# Patient Record
Sex: Female | Born: 1980 | Race: White | Hispanic: No | Marital: Single | Smoking: Current every day smoker
Health system: Southern US, Community
[De-identification: ages and names within clinical notes are randomized; demographics above are authoritative.]

## PROBLEM LIST (undated history)

## (undated) DIAGNOSIS — F603 Borderline personality disorder: Secondary | ICD-10-CM

## (undated) DIAGNOSIS — T183XXA Foreign body in small intestine, initial encounter: Secondary | ICD-10-CM

## (undated) DIAGNOSIS — E039 Hypothyroidism, unspecified: Secondary | ICD-10-CM

## (undated) DIAGNOSIS — K219 Gastro-esophageal reflux disease without esophagitis: Secondary | ICD-10-CM

## (undated) DIAGNOSIS — F909 Attention-deficit hyperactivity disorder, unspecified type: Secondary | ICD-10-CM

## (undated) DIAGNOSIS — B20 Human immunodeficiency virus [HIV] disease: Secondary | ICD-10-CM

## (undated) DIAGNOSIS — J45909 Unspecified asthma, uncomplicated: Secondary | ICD-10-CM

## (undated) DIAGNOSIS — F32A Depression, unspecified: Secondary | ICD-10-CM

## (undated) DIAGNOSIS — E119 Type 2 diabetes mellitus without complications: Secondary | ICD-10-CM

## (undated) DIAGNOSIS — F419 Anxiety disorder, unspecified: Secondary | ICD-10-CM

## (undated) DIAGNOSIS — K259 Gastric ulcer, unspecified as acute or chronic, without hemorrhage or perforation: Secondary | ICD-10-CM

## (undated) DIAGNOSIS — F1910 Other psychoactive substance abuse, uncomplicated: Secondary | ICD-10-CM

## (undated) DIAGNOSIS — Z21 Asymptomatic human immunodeficiency virus [HIV] infection status: Secondary | ICD-10-CM

## (undated) HISTORY — DX: Type 2 diabetes mellitus without complications: E11.9

## (undated) HISTORY — DX: Foreign body in small intestine, initial encounter: T18.3XXA

## (undated) HISTORY — PX: NO PAST SURGERIES: SHX2092

## (undated) HISTORY — DX: Hypothyroidism, unspecified: E03.9

---
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
## 2012-01-31 DIAGNOSIS — G252 Other specified forms of tremor: Secondary | ICD-10-CM | POA: Insufficient documentation

## 2012-01-31 DIAGNOSIS — E785 Hyperlipidemia, unspecified: Secondary | ICD-10-CM | POA: Insufficient documentation

## 2012-01-31 DIAGNOSIS — G43909 Migraine, unspecified, not intractable, without status migrainosus: Secondary | ICD-10-CM | POA: Insufficient documentation

## 2013-08-12 ENCOUNTER — Other Ambulatory Visit (HOSPITAL_COMMUNITY): Payer: Self-pay | Admitting: Emergency Medicine

## 2013-08-12 ENCOUNTER — Ambulatory Visit (HOSPITAL_COMMUNITY)
Admission: RE | Admit: 2013-08-12 | Discharge: 2013-08-12 | Disposition: A | Discharge status: Home / Self Care | Payer: Self-pay | Source: Ambulatory Visit | Attending: Family Medicine | Admitting: Family Medicine

## 2013-08-12 ENCOUNTER — Encounter (HOSPITAL_COMMUNITY): Payer: Self-pay

## 2013-08-12 DIAGNOSIS — R109 Unspecified abdominal pain: Secondary | ICD-10-CM

## 2013-08-12 DIAGNOSIS — R1011 Right upper quadrant pain: Secondary | ICD-10-CM | POA: Insufficient documentation
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
## 2013-08-12 MED ORDER — IOHEXOL 300 MG/ML  SOLN
100.0000 mL | Freq: Once | INTRAMUSCULAR | Status: AC | PRN
  Administered 2013-08-12: 100 mL via INTRAVENOUS

## 2014-02-12 ENCOUNTER — Emergency Department (HOSPITAL_COMMUNITY): Payer: Self-pay

## 2014-02-12 ENCOUNTER — Encounter (HOSPITAL_COMMUNITY): Payer: Self-pay | Admitting: Family Medicine

## 2014-02-12 ENCOUNTER — Emergency Department (HOSPITAL_COMMUNITY)
Admission: EM | Admit: 2014-02-12 | Discharge: 2014-02-13 | Disposition: A | Discharge status: Home / Self Care | Payer: Self-pay | Attending: Emergency Medicine | Admitting: Emergency Medicine

## 2014-02-12 DIAGNOSIS — Z3202 Encounter for pregnancy test, result negative: Secondary | ICD-10-CM | POA: Insufficient documentation

## 2014-02-12 DIAGNOSIS — K529 Noninfective gastroenteritis and colitis, unspecified: Secondary | ICD-10-CM | POA: Insufficient documentation

## 2014-02-12 DIAGNOSIS — R112 Nausea with vomiting, unspecified: Secondary | ICD-10-CM | POA: Insufficient documentation

## 2014-02-12 DIAGNOSIS — Z79899 Other long term (current) drug therapy: Secondary | ICD-10-CM | POA: Insufficient documentation

## 2014-02-12 LAB — CBC WITH DIFFERENTIAL/PLATELET
Basophils Absolute: 0 10*3/uL (ref 0.0–0.1)
Basophils Relative: 0 % (ref 0–1)
Eosinophils Absolute: 0 10*3/uL (ref 0.0–0.7)
Eosinophils Relative: 0 % (ref 0–5)
HCT: 44 % (ref 36.0–46.0)
Hemoglobin: 14.4 g/dL (ref 12.0–15.0)
Lymphocytes Relative: 10 % — ABNORMAL LOW (ref 12–46)
Lymphs Abs: 1.1 10*3/uL (ref 0.7–4.0)
MCH: 28.9 pg (ref 26.0–34.0)
MCHC: 32.7 g/dL (ref 30.0–36.0)
MCV: 88.2 fL (ref 78.0–100.0)
Monocytes Absolute: 0.8 10*3/uL (ref 0.1–1.0)
Monocytes Relative: 7 % (ref 3–12)
Neutro Abs: 9.7 10*3/uL — ABNORMAL HIGH (ref 1.7–7.7)
Neutrophils Relative %: 83 % — ABNORMAL HIGH (ref 43–77)
Platelets: 242 10*3/uL (ref 150–400)
RBC: 4.99 MIL/uL (ref 3.87–5.11)
RDW: 12.4 % (ref 11.5–15.5)
WBC: 11.6 10*3/uL — ABNORMAL HIGH (ref 4.0–10.5)

## 2014-02-12 LAB — URINALYSIS, ROUTINE W REFLEX MICROSCOPIC
Bilirubin Urine: NEGATIVE
Glucose, UA: NEGATIVE mg/dL
Hgb urine dipstick: NEGATIVE
Ketones, ur: >80 mg/dL — AB
Leukocytes, UA: NEGATIVE
Nitrite: NEGATIVE
Protein, ur: NEGATIVE mg/dL
Specific Gravity, Urine: 1.027 (ref 1.005–1.030)
Urobilinogen, UA: 1 mg/dL (ref 0.0–1.0)
pH: 6 (ref 5.0–8.0)

## 2014-02-12 LAB — COMPREHENSIVE METABOLIC PANEL
ALT: 12 U/L (ref 0–35)
AST: 19 U/L (ref 0–37)
Albumin: 4.7 g/dL (ref 3.5–5.2)
Alkaline Phosphatase: 56 U/L (ref 39–117)
Anion gap: 18 — ABNORMAL HIGH (ref 5–15)
BUN: 21 mg/dL (ref 6–23)
CO2: 22 mEq/L (ref 19–32)
Calcium: 10.2 mg/dL (ref 8.4–10.5)
Chloride: 100 mEq/L (ref 96–112)
Creatinine, Ser: 0.74 mg/dL (ref 0.50–1.10)
GFR calc Af Amer: >90 mL/min (ref >90)
GFR calc non Af Amer: >90 mL/min (ref >90)
Glucose, Bld: 118 mg/dL — ABNORMAL HIGH (ref 70–99)
Potassium: 4.3 mEq/L (ref 3.7–5.3)
Sodium: 140 mEq/L (ref 137–147)
Total Bilirubin: 0.8 mg/dL (ref 0.3–1.2)
Total Protein: 7.7 g/dL (ref 6.0–8.3)

## 2014-02-12 LAB — LIPASE, BLOOD: Lipase: 23 U/L (ref 11–59)

## 2014-02-12 LAB — PREGNANCY, URINE: Preg Test, Ur: NEGATIVE

## 2014-02-12 IMAGING — CT CT ABD-PELV W/ CM
2 of 4 series · 16 of 46 positions shown, 18 images · IV contrast (Omni 300)
Comparison: [DATE]

CLINICAL DATA: Lower abdominal pain, nausea, vomiting x3 days

EXAM:
CT ABDOMEN AND PELVIS WITH CONTRAST
TECHNIQUE: Multidetector CT imaging of the abdomen and pelvis was performed
using the standard protocol following bolus administration of
intravenous contrast.
CONTRAST:  100mL OMNIPAQUE IOHEXOL 300 MG/ML  SOLN

[Series 2: abd/ pelvis 5.0 i30f 1 · axial · 0.63mm/px · z∈[+813,+1243]mm · 13 of 94 slices shown, 15 images]
[im 4/94  soft-tissue]
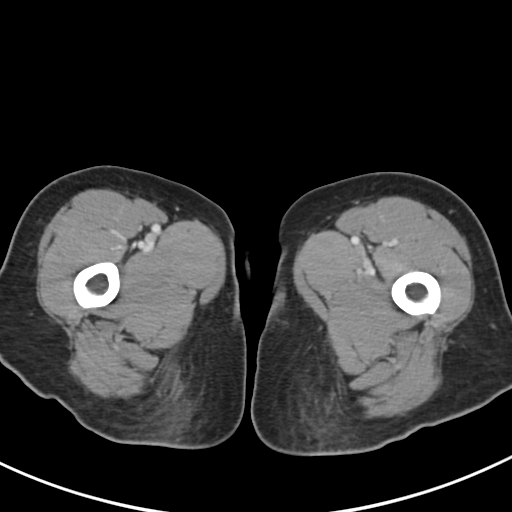
[im 4/94  bone]
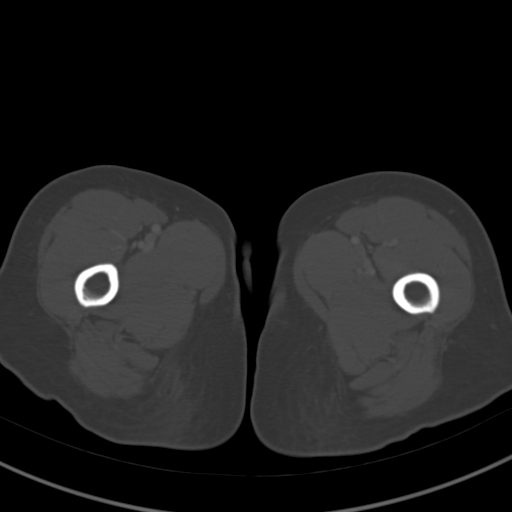
[im 12/94  soft-tissue]
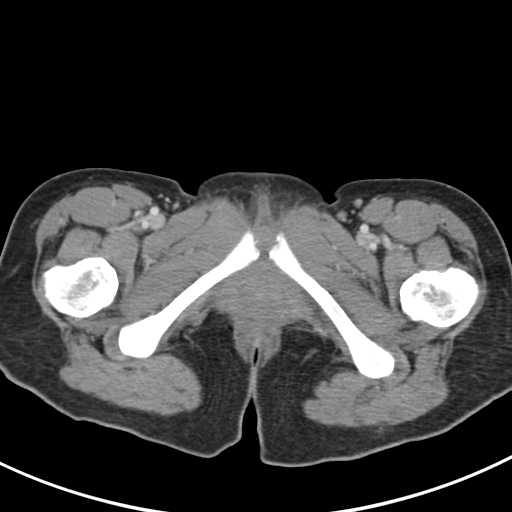
[im 19/94  soft-tissue]
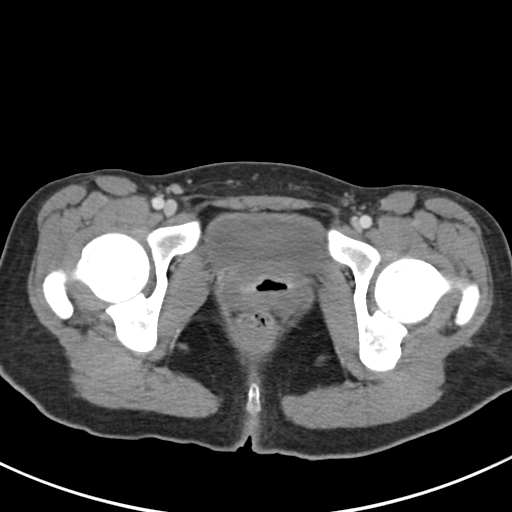
[im 27/94  soft-tissue]
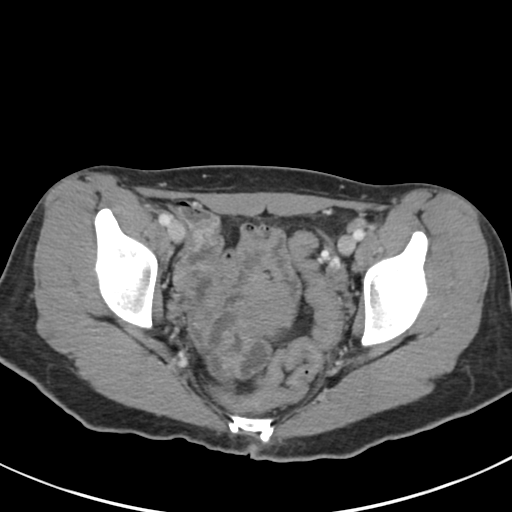
[im 34/94  soft-tissue]
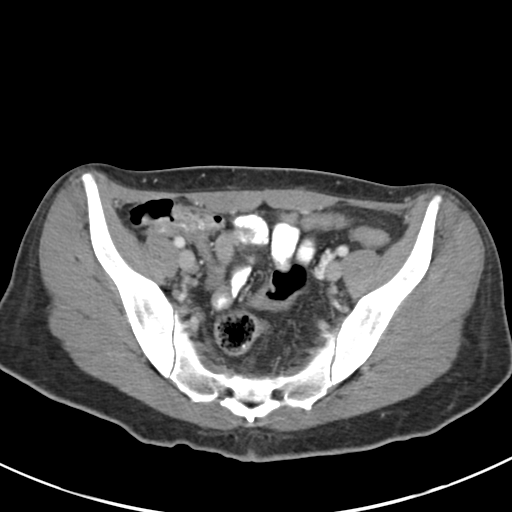
[im 41/94  soft-tissue]
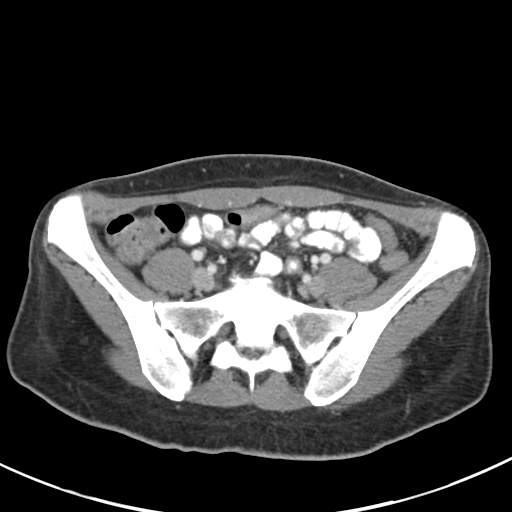
[im 49/94  soft-tissue]
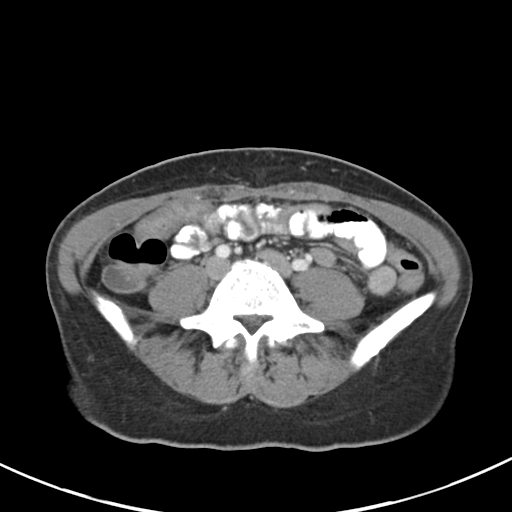
[im 53/94  soft-tissue]
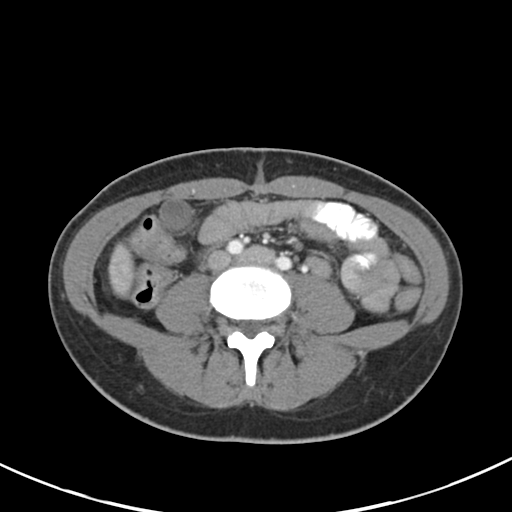
[im 60/94  soft-tissue]
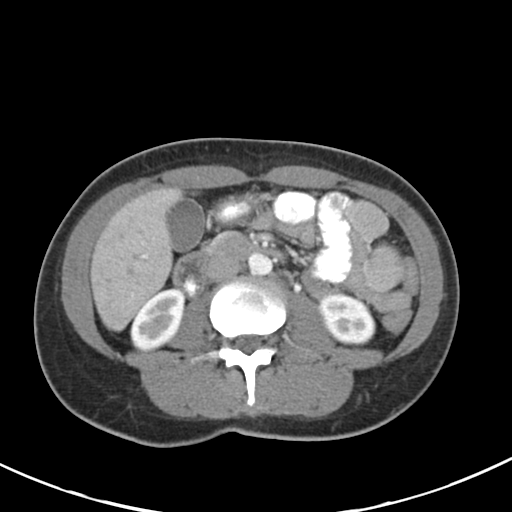
[im 60/94  bone]
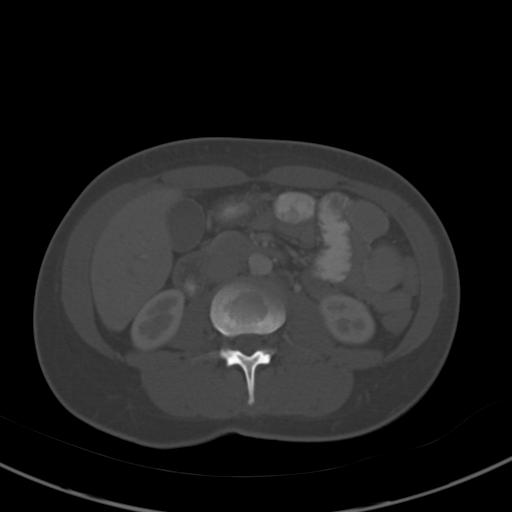
[im 67/94  soft-tissue]
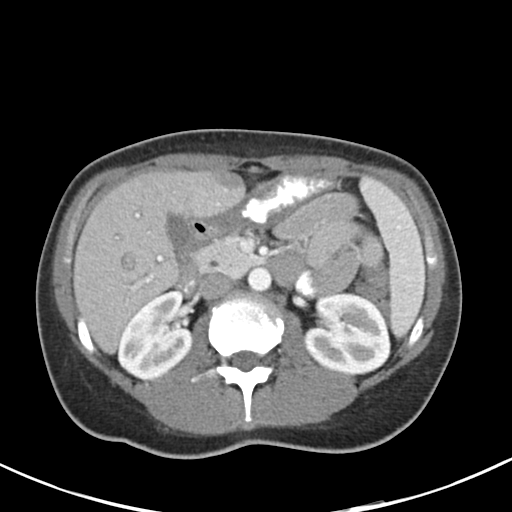
[im 75/94  soft-tissue]
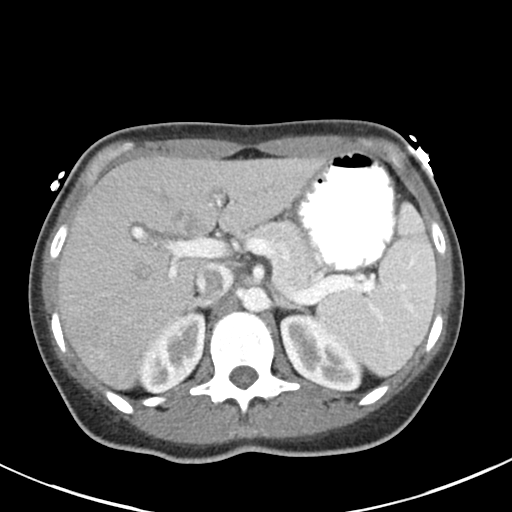
[im 82/94  soft-tissue]
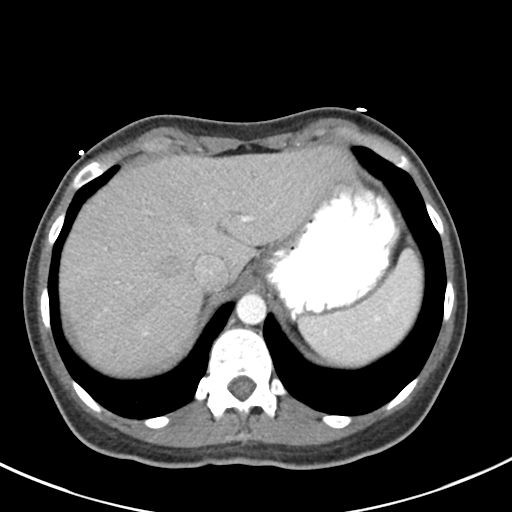
[im 90/94  soft-tissue]
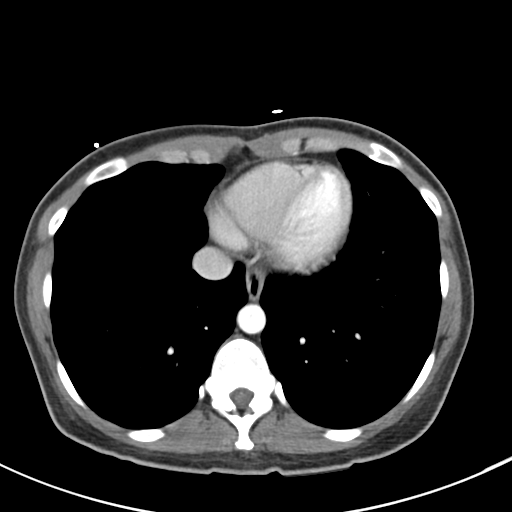

[Series 5: coronals · coronal · 0.70mm/px · 3 of 110 slices shown]
[im 37/110  soft-tissue]
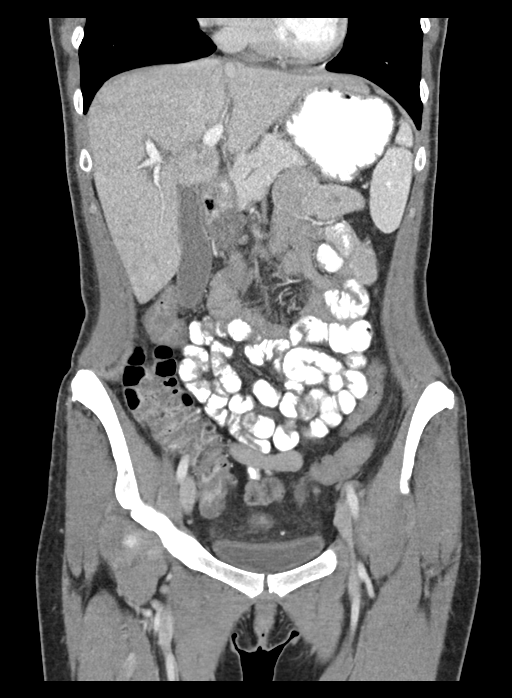
[im 49/110  soft-tissue]
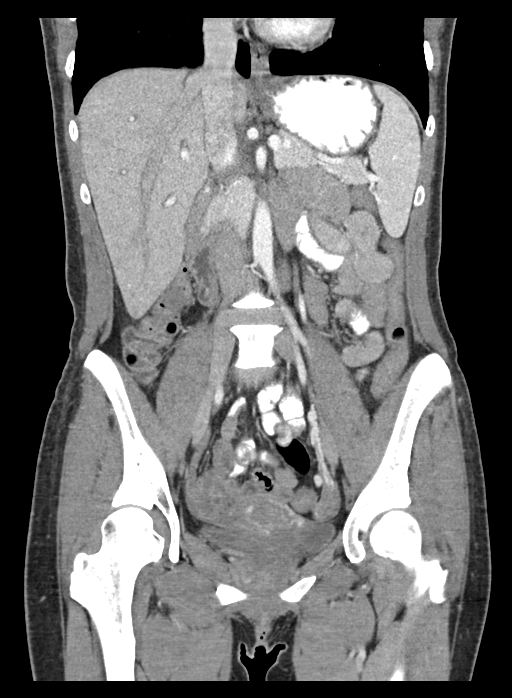
[im 61/110  soft-tissue]
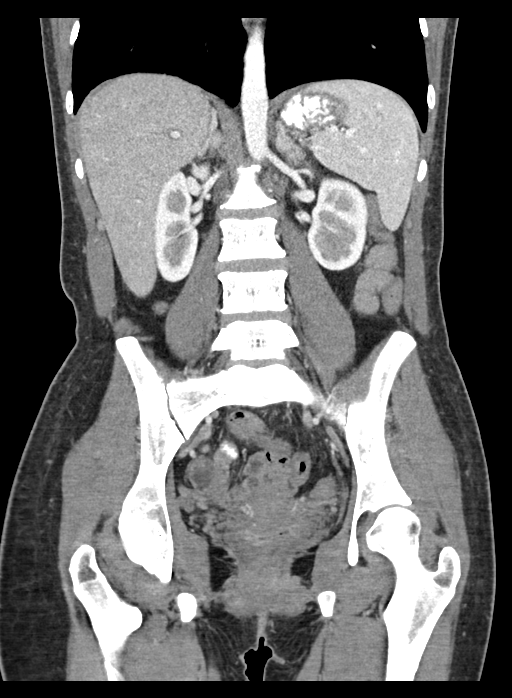

[16 of 46 positions shown; findings below may reference images not displayed]

FINDINGS: Lower chest:  Lung bases are clear.

Hepatobiliary: Liver is notable for mild periportal edema. Suspected
focal fat/ altered perfusion along the falciform ligament (series 2/
image 26).

Gallbladder is unremarkable. No intrahepatic or extrahepatic ductal
dilatation.

Pancreas: Within normal limits.

Spleen: Within normal limits.

Adrenals/Urinary Tract: Adrenal glands are unremarkable.

Kidneys are within normal limits.  No hydronephrosis.

Bladder is mildly thick-walled but underdistended.

Stomach/Bowel: Stomach is unremarkable.

No evidence of bowel obstruction.

Normal appendix.

Right colon is mildly thick-walled, (series 2/image 37), suggesting
infectious/inflammatory colitis.

Vascular/Lymphatic: No evidence of abdominal aortic aneurysm.

No suspicious abdominopelvic lymphadenopathy.

Reproductive: Uterus is unremarkable.

Bilateral ovaries are within normal limits.

Other: No abdominopelvic ascites.

Nonspecific pelvic mesenteric stranding (series 2/ image 72).

Musculoskeletal: Visualized osseous structures are within normal
limits.
IMPRESSION: Mildly thick-walled right colon, suggesting infectious/inflammatory
colitis.

No evidence of bowel obstruction.  Normal appendix.

## 2014-02-12 MED ORDER — METRONIDAZOLE 500 MG PO TABS: 500.0000 mg | ORAL_TABLET | Freq: Two times a day (BID) | ORAL | Status: DC

## 2014-02-12 MED ORDER — CIPROFLOXACIN HCL 500 MG PO TABS: 500.0000 mg | ORAL_TABLET | Freq: Two times a day (BID) | ORAL | Status: DC

## 2014-02-12 MED ORDER — FENTANYL CITRATE 0.05 MG/ML IJ SOLN
50.0000 ug | Freq: Once | INTRAMUSCULAR | Status: AC
  Administered 2014-02-12: 50 ug via INTRAVENOUS

## 2014-02-12 MED ORDER — CIPROFLOXACIN IN D5W 400 MG/200ML IV SOLN
400.0000 mg | Freq: Once | INTRAVENOUS | Status: AC
  Administered 2014-02-12: 400 mg via INTRAVENOUS

## 2014-02-12 MED ORDER — SODIUM CHLORIDE 0.9 % IV BOLUS (SEPSIS)
1000.0000 mL | Freq: Once | INTRAVENOUS | Status: AC
  Administered 2014-02-12: 1000 mL via INTRAVENOUS

## 2014-02-12 MED ORDER — METRONIDAZOLE IN NACL 5-0.79 MG/ML-% IV SOLN
500.0000 mg | Freq: Once | INTRAVENOUS | Status: AC
  Administered 2014-02-12: 500 mg via INTRAVENOUS

## 2014-02-12 MED ORDER — IOHEXOL 300 MG/ML  SOLN
100.0000 mL | Freq: Once | INTRAMUSCULAR | Status: AC | PRN
  Administered 2014-02-12: 100 mL via INTRAVENOUS

## 2014-02-12 MED ORDER — HYDROCODONE-ACETAMINOPHEN 5-325 MG PO TABS
1.0000 | ORAL_TABLET | Freq: Four times a day (QID) | ORAL | Status: DC | PRN
Start: 2014-02-12 — End: 2015-05-02

## 2014-02-12 MED ORDER — ONDANSETRON 4 MG PO TBDP
8.0000 mg | ORAL_TABLET | Freq: Once | ORAL | Status: AC
  Administered 2014-02-12: 8 mg via ORAL

## 2014-02-12 MED ORDER — ONDANSETRON HCL 4 MG/2ML IJ SOLN
4.0000 mg | Freq: Once | INTRAMUSCULAR | Status: AC
  Administered 2014-02-12: 4 mg via INTRAVENOUS

## 2014-02-12 MED ORDER — FLUCONAZOLE 200 MG PO TABS: 200.0000 mg | ORAL_TABLET | Freq: Every day | ORAL | Status: AC

## 2014-02-12 MED ORDER — IOHEXOL 300 MG/ML  SOLN
25.0000 mL | INTRAMUSCULAR | Status: AC
  Administered 2014-02-12: 25 mL via ORAL

## 2014-02-12 NOTE — ED Notes (Signed)
Pt having abdominal pain and vomiting since Tuesday. Denies diarrhea. sts last LMP 1 month ago.

## 2014-02-12 NOTE — Discharge Instructions (Signed)
As discussed, your evaluation today has resulted in a diagnosis of colitis.  This is inflammation and infection of the colon.  Given your history of IBS it is very important that you follow-up with appropriate care providers.  Please take all medication as directed, and do not hesitate to return here if you develop new, or concerning changes in your condition in the interim.   Colitis Colitis is inflammation of the colon. Colitis can be a short-term or long-standing (chronic) illness. Crohn's disease and ulcerative colitis are 2 types of colitis which are chronic. They usually require lifelong treatment. CAUSES  There are many different causes of colitis, including:  Viruses.  Germs (bacteria).  Medicine reactions. SYMPTOMS   Diarrhea.  Intestinal bleeding.  Pain.  Fever.  Throwing up (vomiting).  Tiredness (fatigue).  Weight loss.  Bowel blockage. DIAGNOSIS  The diagnosis of colitis is based on examination and stool or blood tests. X-rays, CT scan, and colonoscopy may also be needed. TREATMENT  Treatment may include:  Fluids given through the vein (intravenously).  Bowel rest (nothing to eat or drink for a period of time).  Medicine for pain and diarrhea.  Medicines (antibiotics) that kill germs.  Cortisone medicines.  Surgery. HOME CARE INSTRUCTIONS   Get plenty of rest.  Drink enough water and fluids to keep your urine clear or pale yellow.  Eat a well-balanced diet.  Call your caregiver for follow-up as recommended. SEEK IMMEDIATE MEDICAL CARE IF:   You develop chills.  You have an oral temperature above 102 F (38.9 C), not controlled by medicine.  You have extreme weakness, fainting, or dehydration.  You have repeated vomiting.  You develop severe belly (abdominal) pain or are passing bloody or tarry stools. MAKE SURE YOU:   Understand these instructions.  Will watch your condition.  Will get help right away if you are not doing well or  get worse. Document Released: 04/05/2004 Document Revised: 05/21/2011 Document Reviewed: 07/01/2009 [facility] Patient Information 2015 [place], [place]. This information is not intended to replace advice given to you by your health care provider. Make sure you discuss any questions you have with your health care provider.

## 2014-02-12 NOTE — ED Provider Notes (Signed)
CSN: [id number]     Arrival date & time 02/12/14  1716 History   First MD Initiated Contact with Patient 02/12/14 1924     Chief Complaint  Patient presents with  . Emesis  . Abdominal Pain     HPI  Patient presents with concern of nausea, vomiting, abdominal pain. The abdominal pain is diffuse, but most prominent in the periumbilical area.  Pain is sharp, crampy. Symptoms began 3 days ago.  Since onset symptoms have been persistent. Patient denies diarrhea, but also endorses anorexia, states that she has had little by mouth intake in the past 3 days. She denies fevers, chills, confusion, disorientation. No relief with anything. Patient states that she can take her medication, but with difficulty, and is unsure if she has vomited the medication back up.   History reviewed. No pertinent past medical history. History reviewed. No pertinent past surgical history. History reviewed. No pertinent family history. History  Substance Use Topics  . Smoking status: Unknown If Ever Smoked  . Smokeless tobacco: Not on file  . Alcohol Use: Not on file   OB History    No data available     Review of Systems  Constitutional:       Per HPI, otherwise negative  HENT:       Per HPI, otherwise negative  Respiratory:       Per HPI, otherwise negative  Cardiovascular:       Per HPI, otherwise negative  Gastrointestinal: Positive for nausea, vomiting and abdominal pain.  Endocrine:       Negative aside from HPI  Genitourinary:       Neg aside from HPI   Musculoskeletal:       Per HPI, otherwise negative  Skin: Negative.   Neurological: Negative for syncope.      Allergies  Review of patient's allergies indicates no known allergies.  Home Medications   Prior to Admission medications   Medication Sig Start Date End Date Taking? Authorizing Provider  albuterol (PROVENTIL HFA;VENTOLIN HFA) 108 (90 BASE) MCG/ACT inhaler Inhale 1 puff into the lungs every 6 (six) hours as needed for  wheezing or shortness of breath.   Yes Historical Provider, MD  clonazePAM (KLONOPIN) 1 MG tablet Take 1 mg by mouth 3 (three) times daily as needed for anxiety.  02/08/14  Yes Historical Provider, MD  FLUoxetine (PROZAC) 40 MG capsule Take 40 mg by mouth daily. 07/06/13  Yes Historical Provider, MD  levothyroxine (SYNTHROID, LEVOTHROID) 25 MCG tablet Take 25 mcg by mouth daily. 02/08/14  Yes Historical Provider, MD  traZODone (DESYREL) 150 MG tablet Take 150 mg by mouth at bedtime. 08/11/13  Yes Historical Provider, MD   BP 139/75 mmHg  Pulse 58  Temp(Src) 98.3 F (36.8 C) (Oral)  Resp 20  SpO2 100%  LMP 01/13/2014 Physical Exam  Constitutional: She is oriented to person, place, and time. She appears well-developed and well-nourished. No distress.  HENT:  Head: Normocephalic and atraumatic.  Eyes: Conjunctivae and EOM are normal.  Cardiovascular: Normal rate and regular rhythm.   Pulmonary/Chest: Effort normal and breath sounds normal. No stridor. No respiratory distress.  Abdominal: She exhibits no distension. There is no hepatosplenomegaly. There is tenderness. There is guarding. There is no rebound.    Musculoskeletal: She exhibits no edema.  Neurological: She is alert and oriented to person, place, and time. No cranial nerve deficit.  Skin: Skin is warm and dry.  Psychiatric: She has a normal mood and affect.  Nursing note  and vitals reviewed.   ED Course  Procedures (including critical care time) Labs Review Labs Reviewed  CBC WITH DIFFERENTIAL - Abnormal; Notable for the following:    WBC 11.6 (*)    Neutrophils Relative % 83 (*)    Neutro Abs 9.7 (*)    Lymphocytes Relative 10 (*)    All other components within normal limits  COMPREHENSIVE METABOLIC PANEL - Abnormal; Notable for the following:    Glucose, Bld 118 (*)    Anion gap 18 (*)    All other components within normal limits  URINALYSIS, ROUTINE W REFLEX MICROSCOPIC - Abnormal; Notable for the following:     Ketones, ur >80 (*)    All other components within normal limits  LIPASE, BLOOD  PREGNANCY, URINE    Imaging Review Ct Abdomen Pelvis W Contrast  02/12/2014   CLINICAL DATA:  Lower abdominal pain, nausea, vomiting x3 days  EXAM: CT ABDOMEN AND PELVIS WITH CONTRAST  TECHNIQUE: Multidetector CT imaging of the abdomen and pelvis was performed using the standard protocol following bolus administration of intravenous contrast.  CONTRAST:  100mL OMNIPAQUE IOHEXOL 300 MG/ML  SOLN  COMPARISON:  08/12/2013  FINDINGS: Lower chest:  Lung bases are clear.  Hepatobiliary: Liver is notable for mild periportal edema. Suspected focal fat/ altered perfusion along the falciform ligament (series 2/ image 26).  Gallbladder is unremarkable. No intrahepatic or extrahepatic ductal dilatation.  Pancreas: Within normal limits.  Spleen: Within normal limits.  Adrenals/Urinary Tract: Adrenal glands are unremarkable.  Kidneys are within normal limits.  No hydronephrosis.  Bladder is mildly thick-walled but underdistended.  Stomach/Bowel: Stomach is unremarkable.  No evidence of bowel obstruction.  Normal appendix.  Right colon is mildly thick-walled, (series 2/image 37), suggesting infectious/inflammatory colitis.  Vascular/Lymphatic: No evidence of abdominal aortic aneurysm.  No suspicious abdominopelvic lymphadenopathy.  Reproductive: Uterus is unremarkable.  Bilateral ovaries are within normal limits.  Other: No abdominopelvic ascites.  Nonspecific pelvic mesenteric stranding (series 2/ image 72).  Musculoskeletal: Visualized osseous structures are within normal limits.  IMPRESSION: Mildly thick-walled right colon, suggesting infectious/inflammatory colitis.  No evidence of bowel obstruction.  Normal appendix.   Electronically Signed   By: Charline BillsSriyesh  Krishnan M.D.   On: 02/12/2014 22:25     EKG Interpretation None     Already exam the patient appears slightly better, though she continues to complain of discomfort. We had a  lengthy conversation about colitis.  It ended the conversation stated that she had IBS, but has not ever seen a gastroenterologist. Patient and I, and her companion discussed the need to follow-up with gastroenterology, possibly [facility], for appropriate management of IBS. Given the patient's improvement here, she will receive an initial IV antibiotics, then be discharged if these are well tolerated, to follow-up with gastroenterology.  MDM   This patient who eventually states that she has IBS now presents with persistent abdominal pain, nausea, vomiting, anorexia. Patient's evaluation is consistent with colitis. No evidence for peritonitis. Patient was started on antibiotics, discharged to follow-up with gastroenterology.     Gerhard Munchobert Chonita Gadea, MD 02/12/14 [phone]

## 2014-02-13 MED ORDER — FENTANYL CITRATE 0.05 MG/ML IJ SOLN
50.0000 ug | Freq: Once | INTRAMUSCULAR | Status: AC
  Administered 2014-02-13: 50 ug via INTRAVENOUS

## 2015-05-02 ENCOUNTER — Emergency Department (HOSPITAL_COMMUNITY)
Admission: EM | Admit: 2015-05-02 | Discharge: 2015-05-02 | Disposition: A | Discharge status: Home / Self Care | Payer: Self-pay | Attending: Emergency Medicine | Admitting: Emergency Medicine

## 2015-05-02 ENCOUNTER — Encounter (HOSPITAL_COMMUNITY): Payer: Self-pay | Admitting: Emergency Medicine

## 2015-05-02 DIAGNOSIS — J45909 Unspecified asthma, uncomplicated: Secondary | ICD-10-CM | POA: Insufficient documentation

## 2015-05-02 DIAGNOSIS — R109 Unspecified abdominal pain: Secondary | ICD-10-CM

## 2015-05-02 DIAGNOSIS — R110 Nausea: Secondary | ICD-10-CM | POA: Insufficient documentation

## 2015-05-02 DIAGNOSIS — R6889 Other general symptoms and signs: Secondary | ICD-10-CM

## 2015-05-02 DIAGNOSIS — Z8659 Personal history of other mental and behavioral disorders: Secondary | ICD-10-CM | POA: Insufficient documentation

## 2015-05-02 DIAGNOSIS — A084 Viral intestinal infection, unspecified: Secondary | ICD-10-CM | POA: Insufficient documentation

## 2015-05-02 DIAGNOSIS — Z3202 Encounter for pregnancy test, result negative: Secondary | ICD-10-CM | POA: Insufficient documentation

## 2015-05-02 DIAGNOSIS — Z79899 Other long term (current) drug therapy: Secondary | ICD-10-CM | POA: Insufficient documentation

## 2015-05-02 HISTORY — DX: Unspecified asthma, uncomplicated: J45.909

## 2015-05-02 HISTORY — DX: Gastric ulcer, unspecified as acute or chronic, without hemorrhage or perforation: K25.9

## 2015-05-02 HISTORY — DX: Borderline personality disorder: F60.3

## 2015-05-02 LAB — CBC
HCT: 47.6 % — ABNORMAL HIGH (ref 36.0–46.0)
Hemoglobin: 16.1 g/dL — ABNORMAL HIGH (ref 12.0–15.0)
MCH: 30 pg (ref 26.0–34.0)
MCHC: 33.8 g/dL (ref 30.0–36.0)
MCV: 88.6 fL (ref 78.0–100.0)
Platelets: 251 10*3/uL (ref 150–400)
RBC: 5.37 MIL/uL — ABNORMAL HIGH (ref 3.87–5.11)
RDW: 12.9 % (ref 11.5–15.5)
WBC: 13.2 10*3/uL — ABNORMAL HIGH (ref 4.0–10.5)

## 2015-05-02 LAB — COMPREHENSIVE METABOLIC PANEL
ALT: 16 U/L (ref 14–54)
AST: 21 U/L (ref 15–41)
Albumin: 4.8 g/dL (ref 3.5–5.0)
Alkaline Phosphatase: 68 U/L (ref 38–126)
Anion gap: 16 — ABNORMAL HIGH (ref 5–15)
BUN: 30 mg/dL — ABNORMAL HIGH (ref 6–20)
CO2: 16 mmol/L — ABNORMAL LOW (ref 22–32)
Calcium: 9.7 mg/dL (ref 8.9–10.3)
Chloride: 107 mmol/L (ref 101–111)
Creatinine, Ser: 0.93 mg/dL (ref 0.44–1.00)
GFR calc Af Amer: >60 mL/min (ref >60)
GFR calc non Af Amer: >60 mL/min (ref >60)
Glucose, Bld: 183 mg/dL — ABNORMAL HIGH (ref 65–99)
Potassium: 4.3 mmol/L (ref 3.5–5.1)
Sodium: 139 mmol/L (ref 135–145)
Total Bilirubin: 1.3 mg/dL — ABNORMAL HIGH (ref 0.3–1.2)
Total Protein: 7.8 g/dL (ref 6.5–8.1)

## 2015-05-02 LAB — I-STAT BETA HCG BLOOD, ED (MC, WL, AP ONLY): I-stat hCG, quantitative: <5 m[IU]/mL (ref <5)

## 2015-05-02 LAB — LIPASE, BLOOD: Lipase: 25 U/L (ref 11–51)

## 2015-05-02 MED ORDER — TRAMADOL HCL 50 MG PO TABS
100.0000 mg | ORAL_TABLET | Freq: Once | ORAL | Status: AC
  Administered 2015-05-02: 100 mg via ORAL
  Filled 2015-05-02: qty 2

## 2015-05-02 MED ORDER — OXYCODONE-ACETAMINOPHEN 5-325 MG PO TABS
1.0000 | ORAL_TABLET | Freq: Once | ORAL | Status: AC
  Administered 2015-05-02: 1 via ORAL
  Filled 2015-05-02: qty 1

## 2015-05-02 MED ORDER — ONDANSETRON 4 MG PO TBDP
4.0000 mg | ORAL_TABLET | Freq: Once | ORAL | Status: AC | PRN
  Administered 2015-05-02: 4 mg via ORAL
  Filled 2015-05-02: qty 1

## 2015-05-02 MED ORDER — FAMOTIDINE 20 MG PO TABS
20.0000 mg | ORAL_TABLET | Freq: Once | ORAL | Status: AC
  Administered 2015-05-02: 20 mg via ORAL
  Filled 2015-05-02: qty 1

## 2015-05-02 MED ORDER — ACETAMINOPHEN ER 650 MG PO TBCR: 650.0000 mg | EXTENDED_RELEASE_TABLET | Freq: Three times a day (TID) | ORAL | Status: DC | PRN

## 2015-05-02 MED ORDER — ONDANSETRON 4 MG PO TBDP
4.0000 mg | ORAL_TABLET | Freq: Once | ORAL | Status: AC
  Administered 2015-05-02: 4 mg via ORAL
  Filled 2015-05-02: qty 1

## 2015-05-02 MED ORDER — ONDANSETRON 8 MG PO TBDP: 8.0000 mg | ORAL_TABLET | Freq: Three times a day (TID) | ORAL | Status: DC | PRN

## 2015-05-02 NOTE — ED Notes (Signed)
Pt discharged this morning with same complaint of abdominal pain and nausea; reports one episode of nausea post discharge; has not had prescriptions filled.

## 2015-05-02 NOTE — ED Notes (Signed)
Patient called to come back to room with no response.

## 2015-05-02 NOTE — Discharge Instructions (Signed)
Viral Gastroenteritis Viral gastroenteritis is also known as stomach flu. This condition affects the stomach and intestinal tract. It can cause sudden diarrhea and vomiting. The illness typically lasts 3 to 8 days. Most people develop an immune response that eventually gets rid of the virus. While this natural response develops, the virus can make you quite ill. CAUSES  Many different viruses can cause gastroenteritis, such as rotavirus or noroviruses. You can catch one of these viruses by consuming contaminated food or water. You may also catch a virus by sharing utensils or other personal items with an infected person or by touching a contaminated surface. SYMPTOMS  The most common symptoms are diarrhea and vomiting. These problems can cause a severe loss of body fluids (dehydration) and a body salt (electrolyte) imbalance. Other symptoms may include:  Fever.  Headache.  Fatigue.  Abdominal pain. DIAGNOSIS  Your caregiver can usually diagnose viral gastroenteritis based on your symptoms and a physical exam. A stool sample may also be taken to test for the presence of viruses or other infections. TREATMENT  This illness typically goes away on its own. Treatments are aimed at rehydration. The most serious cases of viral gastroenteritis involve vomiting so severely that you are not able to keep fluids down. In these cases, fluids must be given through an intravenous line (IV). HOME CARE INSTRUCTIONS   Drink enough fluids to keep your urine clear or pale yellow. Drink small amounts of fluids frequently and increase the amounts as tolerated.  Ask your caregiver for specific rehydration instructions.  Avoid:  Foods high in sugar.  Alcohol.  Carbonated drinks.  Tobacco.  Juice.  Caffeine drinks.  Extremely hot or cold fluids.  Fatty, greasy foods.  Too much intake of anything at one time.  Dairy products until 24 to 48 hours after diarrhea stops.  You may consume  probiotics. Probiotics are active cultures of beneficial bacteria. They may lessen the amount and number of diarrheal stools in adults. Probiotics can be found in yogurt with active cultures and in supplements.  Wash your hands well to avoid spreading the virus.  Only take over-the-counter or prescription medicines for pain, discomfort, or fever as directed by your caregiver. Do not give aspirin to children. Antidiarrheal medicines are not recommended.  Ask your caregiver if you should continue to take your regular prescribed and over-the-counter medicines.  Keep all follow-up appointments as directed by your caregiver. SEEK IMMEDIATE MEDICAL CARE IF:   You are unable to keep fluids down.  You do not urinate at least once every 6 to 8 hours.  You develop shortness of breath.  You notice blood in your stool or vomit. This may look like coffee grounds.  You have abdominal pain that increases or is concentrated in one small area (localized).  You have persistent vomiting or diarrhea.  You have a fever.  The patient is a child younger than 3 months, and he or she has a fever.  The patient is a child older than 3 months, and he or she has a fever and persistent symptoms.  The patient is a child older than 3 months, and he or she has a fever and symptoms suddenly get worse.  The patient is a baby, and he or she has no tears when crying. MAKE SURE YOU:   Understand these instructions.  Will watch your condition.  Will get help right away if you are not doing well or get worse.   This information is not intended to replace  advice given to you by your health care provider. Make sure you discuss any questions you have with your health care provider.   Document Released: 02/26/2005 Document Revised: 05/21/2011 Document Reviewed: 12/13/2010 Elsevier Interactive Patient Education 2016 [employer] Abdominal Pain, Adult Many things can cause abdominal pain. Usually, abdominal pain is  not caused by a disease and will improve without treatment. It can often be observed and treated at home. Your health care provider will do a physical exam and possibly order blood tests and X-rays to help determine the seriousness of your pain. However, in many cases, more time must pass before a clear cause of the pain can be found. Before that point, your health care provider may not know if you need more testing or further treatment. HOME CARE INSTRUCTIONS Monitor your abdominal pain for any changes. The following actions may help to alleviate any discomfort you are experiencing:  Only take over-the-counter or prescription medicines as directed by your health care provider.  Do not take laxatives unless directed to do so by your health care provider.  Try a clear liquid diet (broth, tea, or water) as directed by your health care provider. Slowly move to a bland diet as tolerated. SEEK MEDICAL CARE IF:  You have unexplained abdominal pain.  You have abdominal pain associated with nausea or diarrhea.  You have pain when you urinate or have a bowel movement.  You experience abdominal pain that wakes you in the night.  You have abdominal pain that is worsened or improved by eating food.  You have abdominal pain that is worsened with eating fatty foods.  You have a fever. SEEK IMMEDIATE MEDICAL CARE IF:  Your pain does not go away within 2 hours.  You keep throwing up (vomiting).  Your pain is felt only in portions of the abdomen, such as the right side or the left lower portion of the abdomen.  You pass bloody or black tarry stools. MAKE SURE YOU:  Understand these instructions.  Will watch your condition.  Will get help right away if you are not doing well or get worse.   This information is not intended to replace advice given to you by your health care provider. Make sure you discuss any questions you have with your health care provider.   Document Released: 12/06/2004  Document Revised: 11/17/2014 Document Reviewed: 11/05/2012 Elsevier Interactive Patient Education 2016 Else[employer] Inc[employer] Clear Liquid Diet A clear liquid diet is a short-term diet that is prescribed to provide the necessary fluid and basic energy you need when you can have nothing else. The clear liquid diet consists of liquids or solids that will become liquid at room temperature. You should be able to see through the liquid. There are many reasons that you may be restricted to clear liquids, such as:  When you have a sudden-onset (acute) condition that occurs before or after surgery.  To help your body slowly get adjusted to food again after a long period when you were unable to have food.  Replacement of fluids when you have a diarrheal disease.  When you are going to have certain exams, such as a colonoscopy, in which instruments are inserted inside your body to look at parts of your digestive system. WHAT CAN I HAVE? A clear liquid diet does not provide all the nutrients you need. It is important to choose a variety of the following items to get as many nutrients as possible:  Vegetable juices that do not have pulp.  Fruit  juices and fruit drinks that do not have pulp.  Coffee (regular or decaffeinated), tea, or soda at the discretion of your health care provider.  Clear bouillon, broth, or strained broth-based soups.  High-protein and flavored gelatins.  Sugar or honey.  Ices or frozen ice pops that do not contain milk. If you are not sure whether you can have certain items, you should ask your health care provider. You may also ask your health care provider if there are any other clear liquid options.   This information is not intended to replace advice given to you by your health care provider. Make sure you discuss any questions you have with your health care provider.   Document Released: 02/26/2005 Document Revised: 03/03/2013 Document Reviewed: 01/23/2013 Elsevier  Interactive Patient Education [employer]

## 2015-05-02 NOTE — ED Notes (Signed)
Writer provided pt with water for oral challenge

## 2015-05-02 NOTE — ED Notes (Signed)
Patient called twice for a room with no response.

## 2015-05-02 NOTE — ED Provider Notes (Signed)
CSN: [id number]     Arrival date & time 05/02/15  0308 History  By signing my name below, I, Tanda Rockers, attest that this documentation has been prepared under the direction and in the presence of Derwood Kaplan, MD. Electronically Signed: Tanda Rockers, ED Scribe. 05/02/2015. 3:29 AM.   Chief Complaint  Patient presents with  . Flu like Symptoms    The history is provided by the patient. No language interpreter was used.     HPI Comments: Kristin Hart is a 35 y.o. female who presents to the Emergency Department complaining of gradual onset, constant, diffuse abdominal pain that began around 7 PM tonight. Pt also complains of nausea, vomiting, diarrhea, chills, and generalized weakness. She mentions that the vomiting began around 11 PM tonight (approximately 5 hours ago). Pt estimates more than 5 episodes of vomiting and more than 5 episodes of diarrhea since onset. Pt has hx of stomach ulcers but cannot say if her pain is similar. Pt also complains of right arm pain. No recent sick contact with similar symptoms. No suspicious food intake. Denies hematemesis, hematochezia, fever, rhinorrhea, teary eyes, or any other associated symptoms.   Past Medical History  Diagnosis Date  . Asthma   . Stomach ulcer   . Psychiatric disorder   . Borderline personality disorder   . 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 No family history on file. Social History  Substance Use Topics  . Smoking status: Unknown If Ever Smoked  . Smokeless tobacco: Not on file  . Alcohol Use: Not on file   OB History    No data available     Review of Systems  10 Systems reviewed and all are negative for acute change except as noted in the HPI.   Allergies  Review of patient's allergies indicates no known allergies.  Home Medications   Prior to Admission medications   Medication Sig Start Date End Date Taking? Authorizing  Provider  albuterol (PROVENTIL HFA;VENTOLIN HFA) 108 (90 BASE) MCG/ACT inhaler Inhale 1 puff into the lungs every 6 (six) hours as needed for wheezing or shortness of breath.   Yes Historical Provider, MD  anti-nausea (EMETROL) solution Take 10 mLs by mouth every 15 (fifteen) minutes as needed for nausea or vomiting.   Yes Historical Provider, MD  bismuth subsalicylate (PEPTO BISMOL) 262 MG/15ML suspension Take 30 mLs by mouth every 6 (six) hours as needed (for nausea/vomiting).   Yes Historical Provider, MD   BP 110/69 mmHg  Pulse 87  Temp(Src) 98.4 F (36.9 C) (Oral)  Resp 18  Ht  (1.702 m)  Wt 140 lb (63.504 kg)  BMI 21.92 kg/m2  SpO2 99%  LMP 04/26/2015 (Approximate)   Physical Exam  Constitutional: She is oriented to person, place, and time. She appears well-developed and well-nourished. No distress.  HENT:  Head: Normocephalic and atraumatic.  Eyes: Conjunctivae and EOM are normal.  Neck: Neck supple. No tracheal deviation present.  Cardiovascular: Normal rate and regular rhythm.   Pulmonary/Chest: Effort normal and breath sounds normal. No respiratory distress. She has no wheezes. She has no rales.  Abdominal: Bowel sounds are normal. There is tenderness.  Diffuse tenderness but worse on left side and epigastrium  Musculoskeletal: Normal range of motion.  Neurological: She is alert and oriented to person, place, and time.  Skin: Skin is warm and dry.  Psychiatric: She has a normal mood and affect. Her behavior  is normal.  Nursing note and vitals reviewed.   ED Course  Procedures (including critical care time)  DIAGNOSTIC STUDIES: Oxygen Saturation is 98% on RA, normal by my interpretation.    COORDINATION OF CARE: 3:29 AM-Discussed treatment plan with pt at bedside and pt agreed to plan.   Labs Review Labs Reviewed  COMPREHENSIVE METABOLIC PANEL - Abnormal; Notable for the following:    CO2 16 (*)    Glucose, Bld 183 (*)    BUN 30 (*)    Total Bilirubin 1.3  (*)    Anion gap 16 (*)    All other components within normal limits  CBC - Abnormal; Notable for the following:    WBC 13.2 (*)    RBC 5.37 (*)    Hemoglobin 16.1 (*)    HCT 47.6 (*)    All other components within normal limits  LIPASE, BLOOD  URINALYSIS, ROUTINE W REFLEX MICROSCOPIC (NOT AT [facility])  I-STAT BETA HCG BLOOD, ED ([facility], [facility], [facility] ONLY)    Imaging Review No results found.   EKG Interpretation None      :40: patient still has some pain, but feels better. Emesis in control. Repeat abd exam is unchanged - still has diffuse tenderness, no peritoneal signs. If passes po challenge, will d.c. Strict return precautions discussed. Pt agrees with the plan. MDM   Final diagnoses:  None    I personally performed the services described in this documentation, which was scribed in my presence. The recorded information has been reviewed and is accurate.   Pt comes in with flu like symptoms. She has nausea, emesis, abd pain, congestion, myalgias, malaise. She has a non peritoneal abd exam with diffuse pain. She appears dry.  Likely viral syndrome. Will hydrate and get pain in better control.   Derwood Kaplan, MD 05/02/15 [phone]

## 2015-05-02 NOTE — ED Notes (Signed)
Patient called for vital signs recheck with no response. 

## 2015-05-02 NOTE — ED Notes (Signed)
Pt has been instructed not to drive for 4-6 hrs.

## 2015-05-02 NOTE — ED Notes (Signed)
Pt states that she started having generalized body aches, chills and N/V/D at 7pm tonight. Alert and oriented.

## 2015-12-13 LAB — GLUCOSE, POCT (MANUAL RESULT ENTRY): POC Glucose: 112 mg/dl — AB (ref 70–99)

## 2016-02-22 ENCOUNTER — Encounter: Payer: Self-pay | Admitting: Obstetrics & Gynecology

## 2016-02-22 ENCOUNTER — Ambulatory Visit (INDEPENDENT_AMBULATORY_CARE_PROVIDER_SITE_OTHER): Admitting: Obstetrics & Gynecology

## 2016-02-22 DIAGNOSIS — Z3402 Encounter for supervision of normal first pregnancy, second trimester: Secondary | ICD-10-CM

## 2016-02-22 DIAGNOSIS — Z3400 Encounter for supervision of normal first pregnancy, unspecified trimester: Secondary | ICD-10-CM | POA: Insufficient documentation

## 2016-02-22 DIAGNOSIS — F99 Mental disorder, not otherwise specified: Secondary | ICD-10-CM

## 2016-02-22 DIAGNOSIS — F1520 Other stimulant dependence, uncomplicated: Secondary | ICD-10-CM | POA: Insufficient documentation

## 2016-02-22 DIAGNOSIS — Z87898 Personal history of other specified conditions: Secondary | ICD-10-CM

## 2016-02-22 DIAGNOSIS — E039 Hypothyroidism, unspecified: Secondary | ICD-10-CM

## 2016-02-22 DIAGNOSIS — O24919 Unspecified diabetes mellitus in pregnancy, unspecified trimester: Secondary | ICD-10-CM

## 2016-02-22 DIAGNOSIS — O24912 Unspecified diabetes mellitus in pregnancy, second trimester: Secondary | ICD-10-CM

## 2016-02-22 DIAGNOSIS — O99612 Diseases of the digestive system complicating pregnancy, second trimester: Secondary | ICD-10-CM

## 2016-02-22 DIAGNOSIS — O99282 Endocrine, nutritional and metabolic diseases complicating pregnancy, second trimester: Secondary | ICD-10-CM

## 2016-02-22 DIAGNOSIS — R7303 Prediabetes: Secondary | ICD-10-CM | POA: Insufficient documentation

## 2016-02-22 DIAGNOSIS — K5900 Constipation, unspecified: Secondary | ICD-10-CM | POA: Insufficient documentation

## 2016-02-22 DIAGNOSIS — J45909 Unspecified asthma, uncomplicated: Secondary | ICD-10-CM | POA: Insufficient documentation

## 2016-02-22 DIAGNOSIS — F988 Other specified behavioral and emotional disorders with onset usually occurring in childhood and adolescence: Secondary | ICD-10-CM | POA: Insufficient documentation

## 2016-02-22 DIAGNOSIS — E1165 Type 2 diabetes mellitus with hyperglycemia: Secondary | ICD-10-CM | POA: Insufficient documentation

## 2016-02-22 DIAGNOSIS — F1591 Other stimulant use, unspecified, in remission: Secondary | ICD-10-CM

## 2016-02-22 DIAGNOSIS — O99519 Diseases of the respiratory system complicating pregnancy, unspecified trimester: Secondary | ICD-10-CM

## 2016-02-22 MED ORDER — OB COMPLETE PETITE 35-5-1-200 MG PO CAPS: 1.0000 | ORAL_CAPSULE | Freq: Every day | ORAL | 12 refills | Status: DC

## 2016-02-22 NOTE — Progress Notes (Signed)
  Subjective:    Kristin Hart is a  [age] yo SW G1P0 976w3d being seen today for her first obstetrical visit.  Her obstetrical history is significant for h/o Type 2 DM, hypothyroidism, meth use, incarceration, asthma, mental illness.. Patient is not sure about intend to breast feed. Pregnancy history fully reviewed.  Patient reports constipation.  There were no vitals filed for this visit.  HISTORY: OB History  Gravida Para Term Preterm AB Living  1            SAB TAB Ectopic Multiple Live Births               # Outcome Date GA Lbr Len/2nd Weight Sex Delivery Anes PTL Lv  1 Current              Past Medical History:  Diagnosis Date  . Asthma   . Borderline personality disorder   . 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 Diabetes mellitus without complication (HCC)    type 2  . Hypothyroidism   . Psychiatric disorder   . Stomach ulcer    History reviewed. No pertinent surgical history. History reviewed. No pertinent family history.   Exam    Uterus:     Pelvic Exam:    Perineum: No Hemorrhoids   Vulva: Normal, she has an infected hair follicle on her mons   Vagina:  normal mucosa   pH:    Cervix: anteverted   Adnexa: normal adnexa   Bony Pelvis: android  System: Breast:  normal appearance, no masses or tenderness   Skin: normal coloration and turgor, no rashes    Neurologic: oriented   Extremities: normal strength, tone, and muscle mass   HEENT PERRLA   Mouth/Teeth mucous membranes moist, pharynx normal without lesions   Neck supple   Cardiovascular: regular rate and rhythm   Respiratory:  appears well, vitals normal, no respiratory distress, acyanotic, normal RR, ear and throat exam is normal, neck free of mass or lymphadenopathy, chest clear, no wheezing, crepitations, rhonchi, normal symmetric air entry   Abdomen: soft, non-tender; bowel sounds normal; no masses,  no organomegaly   Urinary: urethral meatus normal       Assessment:    Pregnancy: G1P0 Patient Active Problem List   Diagnosis Date Noted  . Supervision of normal first pregnancy 02/22/2016  . Diabetes mellitus affecting pregnancy 02/22/2016  . Hypothyroidism affecting pregnancy 02/22/2016  . Asthma affecting pregnancy, antepartum 02/22/2016  . Mental health disorder 02/22/2016  . Constipation during pregnancy, second trimester 02/22/2016  . History of methamphetamine use 02/22/2016        Plan:     Initial labs drawn. Prenatal vitamins. Problem list reviewed and updated. Genetic Screening discussed Quad Screen: ordered.  Ultrasound discussed; fetal survey: ordered.  Follow up in 4 weeks. If Hba1c, RBS are normal, then 2 hour GTT at next visit. If not, then treat appropriately. Check TSH Kristin Hart Kristin Hart 02/22/2016

## 2016-02-22 NOTE — Progress Notes (Signed)
Pt states that she has HA and constipation.  Pt was previously on Tylenol and stool softner, pt states she has not been given while incarcerated.  Pt states that she would like to have an extra mat while incarcerated. Pt states she does not know dates of last cycle.  States end of July or beginnig Aug. Pt states she has not been treated for Thyroid or Diabetes in approx 2 years Pt states she was recently tested for HIV, RPR, Hep C--all were negative. Pt states she has a bump at pubic line-would like checked.

## 2016-02-26 LAB — CULTURE, OB URINE

## 2016-02-26 LAB — URINE CULTURE, OB REFLEX

## 2016-02-27 LAB — PAP LB, CT-NG, RFX HPV ASCU
Chlamydia, Nuc. Acid Amp: NEGATIVE
Gonococcus, Nuc. Acid Amp: NEGATIVE
PAP Smear Comment: 0

## 2016-02-29 LAB — TOXASSURE SELECT 13 (MW), URINE

## 2016-03-01 LAB — CYSTIC FIBROSIS MUTATION 97: Interpretation: NOT DETECTED

## 2016-03-01 LAB — OBSTETRIC PANEL, INCLUDING HIV
Antibody Screen: NEGATIVE
Basophils Absolute: 0 10*3/uL (ref 0.0–0.2)
Basos: 0 %
EOS (ABSOLUTE): 0.1 10*3/uL (ref 0.0–0.4)
Eos: 1 %
HIV Screen 4th Generation wRfx: NONREACTIVE
Hematocrit: 37.9 % (ref 34.0–46.6)
Hemoglobin: 12.8 g/dL (ref 11.1–15.9)
Hepatitis B Surface Ag: NEGATIVE
Immature Grans (Abs): 0.1 10*3/uL (ref 0.0–0.1)
Immature Granulocytes: 1 %
Lymphocytes Absolute: 1.4 10*3/uL (ref 0.7–3.1)
Lymphs: 13 %
MCH: 30.1 pg (ref 26.6–33.0)
MCHC: 33.8 g/dL (ref 31.5–35.7)
MCV: 89 fL (ref 79–97)
Monocytes Absolute: 0.7 10*3/uL (ref 0.1–0.9)
Monocytes: 7 %
Neutrophils Absolute: 8.2 10*3/uL — ABNORMAL HIGH (ref 1.4–7.0)
Neutrophils: 78 %
Platelets: 236 10*3/uL (ref 150–379)
RBC: 4.25 x10E6/uL (ref 3.77–5.28)
RDW: 13.1 % (ref 12.3–15.4)
RPR Ser Ql: NONREACTIVE
Rh Factor: POSITIVE
Rubella Antibodies, IGG: 9.09 index (ref >0.99)
WBC: 10.4 10*3/uL (ref 3.4–10.8)

## 2016-03-01 LAB — T4, FREE: Free T4: 0.85 ng/dL (ref 0.82–1.77)

## 2016-03-01 LAB — AFP, QUAD SCREEN
DIA Mom Value: 1.04
DIA Value (EIA): 176.83 pg/mL
DSR (By Age)    1 IN: 345
DSR (Second Trimester) 1 IN: 5521
Gestational Age: 19.4 WEEKS
MSAFP Mom: 1.43
MSAFP: 64.9 ng/mL
MSHCG Mom: 0.86
MSHCG: 18544 m[IU]/mL
Maternal Age At EDD: 34.3 YEARS
Osb Risk: 3311
T18 (By Age): 1:1346 {titer}
Test Results:: NEGATIVE
Weight: 183 [lb_av]
uE3 Mom: 1.41
uE3 Value: 2.29 ng/mL

## 2016-03-01 LAB — GLUCOSE, RANDOM: Glucose: 112 mg/dL — ABNORMAL HIGH (ref 65–99)

## 2016-03-01 LAB — T4: T4, Total: 8.7 ug/dL (ref 4.5–12.0)

## 2016-03-01 LAB — HEMOGLOBIN A1C
Est. average glucose Bld gHb Est-mCnc: 97 mg/dL
Hgb A1c MFr Bld: 5 % (ref 4.8–5.6)

## 2016-03-01 LAB — T3 UPTAKE
Free Thyroxine Index: 1.5 (ref 1.2–4.9)
T3 Uptake Ratio: 17 % — ABNORMAL LOW (ref 24–39)

## 2016-03-01 LAB — VARICELLA ZOSTER ANTIBODY, IGG: Varicella zoster IgG: 1405 index (ref >165)

## 2016-03-01 LAB — TSH: TSH: 3.45 u[IU]/mL (ref 0.450–4.500)

## 2016-03-01 LAB — VITAMIN D 25 HYDROXY (VIT D DEFICIENCY, FRACTURES): Vit D, 25-Hydroxy: 25.2 ng/mL — ABNORMAL LOW (ref 30.0–100.0)

## 2016-03-15 ENCOUNTER — Other Ambulatory Visit: Payer: Self-pay | Admitting: Certified Nurse Midwife

## 2016-03-15 DIAGNOSIS — R7989 Other specified abnormal findings of blood chemistry: Secondary | ICD-10-CM | POA: Insufficient documentation

## 2016-03-15 MED ORDER — VITAMIN D (ERGOCALCIFEROL) 1.25 MG (50000 UNIT) PO CAPS: 50000.0000 [IU] | ORAL_CAPSULE | ORAL | 2 refills | Status: DC

## 2016-03-23 ENCOUNTER — Ambulatory Visit (HOSPITAL_COMMUNITY): Payer: Medicaid Other

## 2016-03-23 ENCOUNTER — Encounter: Payer: Medicaid Other | Admitting: Obstetrics

## 2016-04-12 LAB — OB RESULTS CONSOLE HIV ANTIBODY (ROUTINE TESTING): HIV: NONREACTIVE

## 2016-04-12 LAB — CYTOLOGY - PAP: Pap: NEGATIVE

## 2016-04-12 LAB — SICKLE CELL SCREEN: Sickle Cell Screen: NORMAL

## 2016-05-28 ENCOUNTER — Encounter (HOSPITAL_COMMUNITY): Payer: Self-pay | Admitting: Emergency Medicine

## 2016-05-28 ENCOUNTER — Emergency Department (HOSPITAL_COMMUNITY)
Admission: EM | Admit: 2016-05-28 | Discharge: 2016-05-28 | Disposition: A | Discharge status: Home / Self Care | Payer: Medicaid Other | Attending: Emergency Medicine | Admitting: Emergency Medicine

## 2016-05-28 DIAGNOSIS — J45909 Unspecified asthma, uncomplicated: Secondary | ICD-10-CM | POA: Diagnosis not present

## 2016-05-28 DIAGNOSIS — O26893 Other specified pregnancy related conditions, third trimester: Secondary | ICD-10-CM | POA: Diagnosis present

## 2016-05-28 DIAGNOSIS — E039 Hypothyroidism, unspecified: Secondary | ICD-10-CM | POA: Diagnosis not present

## 2016-05-28 DIAGNOSIS — E119 Type 2 diabetes mellitus without complications: Secondary | ICD-10-CM | POA: Insufficient documentation

## 2016-05-28 DIAGNOSIS — Z3A32 32 weeks gestation of pregnancy: Secondary | ICD-10-CM | POA: Insufficient documentation

## 2016-05-28 LAB — BASIC METABOLIC PANEL
Anion gap: 9 (ref 5–15)
BUN: 8 mg/dL (ref 6–20)
CO2: 25 mmol/L (ref 22–32)
Calcium: 9.2 mg/dL (ref 8.9–10.3)
Chloride: 102 mmol/L (ref 101–111)
Creatinine, Ser: 0.78 mg/dL (ref 0.44–1.00)
GFR calc Af Amer: >60 mL/min (ref >60)
GFR calc non Af Amer: >60 mL/min (ref >60)
Glucose, Bld: 139 mg/dL — ABNORMAL HIGH (ref 65–99)
Potassium: 4.8 mmol/L (ref 3.5–5.1)
Sodium: 136 mmol/L (ref 135–145)

## 2016-05-28 LAB — URINALYSIS, ROUTINE W REFLEX MICROSCOPIC
Bilirubin Urine: NEGATIVE
Glucose, UA: 50 mg/dL — AB
Hgb urine dipstick: NEGATIVE
Ketones, ur: NEGATIVE mg/dL
Leukocytes, UA: NEGATIVE
Nitrite: NEGATIVE
Protein, ur: NEGATIVE mg/dL
Specific Gravity, Urine: 1.017 (ref 1.005–1.030)
pH: 6 (ref 5.0–8.0)

## 2016-05-28 LAB — CBG MONITORING, ED: Glucose-Capillary: 138 mg/dL — ABNORMAL HIGH (ref 65–99)

## 2016-05-28 LAB — CBC WITH DIFFERENTIAL/PLATELET
Basophils Absolute: 0 10*3/uL (ref 0.0–0.1)
Basophils Relative: 0 %
Eosinophils Absolute: 0.2 10*3/uL (ref 0.0–0.7)
Eosinophils Relative: 2 %
HCT: 38.7 % (ref 36.0–46.0)
Hemoglobin: 12.8 g/dL (ref 12.0–15.0)
Lymphocytes Relative: 13 %
Lymphs Abs: 1.7 10*3/uL (ref 0.7–4.0)
MCH: 30.7 pg (ref 26.0–34.0)
MCHC: 33.1 g/dL (ref 30.0–36.0)
MCV: 92.8 fL (ref 78.0–100.0)
Monocytes Absolute: 0.9 10*3/uL (ref 0.1–1.0)
Monocytes Relative: 7 %
Neutro Abs: 10.3 10*3/uL — ABNORMAL HIGH (ref 1.7–7.7)
Neutrophils Relative %: 78 %
Platelets: 213 10*3/uL (ref 150–400)
RBC: 4.17 MIL/uL (ref 3.87–5.11)
RDW: 12.5 % (ref 11.5–15.5)
WBC: 13.1 10*3/uL — ABNORMAL HIGH (ref 4.0–10.5)

## 2016-05-28 MED ORDER — SODIUM CHLORIDE 0.9 % IV BOLUS (SEPSIS)
1000.0000 mL | Freq: Once | INTRAVENOUS | Status: AC
  Administered 2016-05-28: 1000 mL via INTRAVENOUS

## 2016-05-28 NOTE — Progress Notes (Signed)
Pt is a G1P0 at 8133 1/[redacted] weeks gestation here with c/o abd cramping. No vaginal bleeding or leaking of fluid. Pt was placed on EFM by the ED staff. FHR is a category 1 tracing with no uc's. Pt says she has been incarcerated and has just recently been released from jail. Says she is a gestational diabetic and takes metformin 500mg  twice daily.  S[facility]. She was seen by Dr. Marice Potterove on 02/22/2016 at 19 3/[redacted] weeks gestation before she was incarcerated. Cervix is closed, thk, presenting part is high. Dr. Alysia PennaErvin notified. [facility] Pt's medical record number given to Dr. Alysia PennaErvin and he says he will send a message to the clinic that the pt needs to be seen. Pt [facility] Says she will call the cli nic. ED staff notified of the plan of care.

## 2016-05-28 NOTE — ED Triage Notes (Signed)
Pt here approx 8 months pregnant with increased fetal movement and pain; pt sts some swelling in feet; pt G1

## 2016-05-28 NOTE — ED Notes (Signed)
Pt moved to room trauma C-- placed on fetal monitor- fetal heart rate =138, left mid abd. Rapid response nurse notified.

## 2016-05-28 NOTE — Discharge Instructions (Signed)
Return here as needed.  [facility]

## 2016-05-28 NOTE — ED Notes (Signed)
EDPA made aware pt has spoken with social work.

## 2016-05-28 NOTE — ED Provider Notes (Signed)
[facility]-EMERGENCY DEPT Provider Note   CSN: [id number] Arrival date & time: 05/28/16  1020     History   Chief Complaint Chief Complaint  Patient presents with  . Abdominal Pain    HPI Kristin Hart is a 36 y.o. female.  HPI Patient presents to the emergency department with intermittent abdominal cramping and the patient is 8 months pregnant.  The patient states she recently got out of jail and has noticed intermittent abdominal cramping over the last few days.  She states she has also had swelling that is increased in her lower extremities over the last few days as well.  Patient states that she does have gestational diabetes.  Patient states she did see receive some care while in prison.  She states that nothing seems make the condition better or worse.  She states that she does not have a GYN. The patient denies chest pain, shortness of breath, headache,blurred vision, neck pain, fever, cough, weakness, numbness, dizziness, anorexia, edema,nausea, vomiting, diarrhea, rash, back pain, dysuria, hematemesis, bloody stool, near syncope, or syncope. Past Medical History:  Diagnosis Date  . Asthma   . Borderline personality disorder   . 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 Diabetes mellitus without complication (HCC)    type 2  . Hypothyroidism   . Psychiatric disorder   . Stomach ulcer     Patient Active Problem List   Diagnosis Date Noted  . Low vitamin D level 03/15/2016  . Supervision of normal first pregnancy 02/22/2016  . Diabetes mellitus affecting pregnancy 02/22/2016  . Hypothyroidism affecting pregnancy 02/22/2016  . Asthma affecting pregnancy, antepartum 02/22/2016  . Mental health disorder 02/22/2016  . Constipation during pregnancy, second trimester 02/22/2016  . History of methamphetamine use 02/22/2016    History reviewed. No pertinent surgical history.  OB History    Gravida Para Term Preterm AB Living   1               SAB TAB Ectopic Multiple Live Births                   Home Medications    Prior to Admission medications   Medication Sig Start Date End Date Taking? Authorizing Provider  Prenat-FeCbn-FeAspGl-FA-Omega (OB COMPLETE PETITE) 35-5-1-200 MG CAPS Take 1 tablet by mouth daily. 02/22/16  Yes Allie Bossier, MD  Vitamin D, Ergocalciferol, (DRISDOL) 50000 units CAPS capsule Take 1 capsule (50,000 Units total) by mouth every 7 (seven) days. 03/15/16  Yes Rachelle Sindy Messing, CNM    Family History History reviewed. No pertinent family history.  Social History Social History  Substance Use Topics  . Smoking status: Never Smoker  . Smokeless tobacco: Never Used  . Alcohol use No     Allergies   Patient has no known allergies.   Review of Systems Review of Systems All other systems negative except as documented in the HPI. All pertinent positives and negatives as reviewed in the HPI.  Physical Exam Updated Vital Signs BP 129/70 (BP Location: Right Arm)   Pulse 93   Temp 98.8 F (37.1 C) (Oral)   Resp 18   LMP 10/09/2015 (Approximate)   SpO2 98%   Physical Exam  Constitutional: She is oriented to person, place, and time. She appears well-developed and well-nourished. No distress.  HENT:  Head: Normocephalic and atraumatic.  Mouth/Throat: Oropharynx is clear and moist.  Eyes: Pupils are equal, round, and reactive to light.  Neck: Normal range  of motion. Neck supple.  Cardiovascular: Normal rate, regular rhythm and normal heart sounds.  Exam reveals no gallop and no friction rub.   No murmur heard. Pulmonary/Chest: Effort normal and breath sounds normal. No respiratory distress. She has no wheezes.  Abdominal: Soft. Bowel sounds are normal. She exhibits no distension. There is no tenderness.    Neurological: She is alert and oriented to person, place, and time. She exhibits normal muscle tone. Coordination normal.  Skin: Skin is warm and dry. Capillary refill takes less than 2  seconds. No rash noted. No erythema.  Psychiatric: She has a normal mood and affect. Her behavior is normal.  Nursing note and vitals reviewed.    ED Treatments / Results  Labs (all labs ordered are listed, but only abnormal results are displayed) Labs Reviewed  BASIC METABOLIC PANEL - Abnormal; Notable for the following:       Result Value   Glucose, Bld 139 (*)    All other components within normal limits  CBC WITH DIFFERENTIAL/PLATELET - Abnormal; Notable for the following:    WBC 13.1 (*)    Neutro Abs 10.3 (*)    All other components within normal limits  URINALYSIS, ROUTINE W REFLEX MICROSCOPIC - Abnormal; Notable for the following:    Glucose, UA 50 (*)    All other components within normal limits  CBG MONITORING, ED - Abnormal; Notable for the following:    Glucose-Capillary 138 (*)    All other components within normal limits    EKG  EKG Interpretation None       Radiology No results found.  Procedures Procedures (including critical care time)  Medications Ordered in ED Medications  sodium chloride 0.9 % bolus 1,000 mL (1,000 mLs Intravenous New Bag/Given 05/28/16 1300)     Initial Impression / Assessment and Plan / ED Course  I have reviewed the triage vital signs and the nursing notes.  Pertinent labs & imaging results that were available during my care of the patient were reviewed by me and considered in my medical decision making (see chart for details).    DOB rapid response nurse saw the patient and feels that she can be discharged home.  They will see her in the [facility] in the next week.  I spoke with the social worker about housing for the patient.  Patient is advised return here as needed, but I did advise her women's hospital would be a better choice for follow-up if she does have any worsening in her condition  Final Clinical Impressions(s) / ED Diagnoses   Final diagnoses:  None    New Prescriptions New Prescriptions    No medications on file     Charlestine NightChristopher Samual Beals, PA-C 05/28/16 1446    Gerhard Munchobert Lockwood, MD 05/28/16 1549

## 2016-06-04 ENCOUNTER — Ambulatory Visit: Payer: Self-pay | Admitting: Clinical

## 2016-06-04 ENCOUNTER — Ambulatory Visit (INDEPENDENT_AMBULATORY_CARE_PROVIDER_SITE_OTHER): Payer: Medicaid Other | Admitting: Family Medicine

## 2016-06-04 VITALS — BP 111/66 | HR 86 | Wt 210.8 lb

## 2016-06-04 DIAGNOSIS — Z3403 Encounter for supervision of normal first pregnancy, third trimester: Secondary | ICD-10-CM

## 2016-06-04 DIAGNOSIS — F1290 Cannabis use, unspecified, uncomplicated: Secondary | ICD-10-CM

## 2016-06-04 DIAGNOSIS — Z87898 Personal history of other specified conditions: Secondary | ICD-10-CM

## 2016-06-04 DIAGNOSIS — Z8659 Personal history of other mental and behavioral disorders: Secondary | ICD-10-CM

## 2016-06-04 DIAGNOSIS — O99283 Endocrine, nutritional and metabolic diseases complicating pregnancy, third trimester: Secondary | ICD-10-CM

## 2016-06-04 DIAGNOSIS — O99343 Other mental disorders complicating pregnancy, third trimester: Secondary | ICD-10-CM

## 2016-06-04 DIAGNOSIS — F329 Major depressive disorder, single episode, unspecified: Secondary | ICD-10-CM

## 2016-06-04 DIAGNOSIS — O24913 Unspecified diabetes mellitus in pregnancy, third trimester: Secondary | ICD-10-CM | POA: Diagnosis present

## 2016-06-04 DIAGNOSIS — E039 Hypothyroidism, unspecified: Secondary | ICD-10-CM

## 2016-06-04 DIAGNOSIS — F1591 Other stimulant use, unspecified, in remission: Secondary | ICD-10-CM

## 2016-06-04 LAB — POCT URINALYSIS DIP (DEVICE)
Bilirubin Urine: NEGATIVE
Glucose, UA: 100 mg/dL — AB
Ketones, ur: NEGATIVE mg/dL
Leukocytes, UA: NEGATIVE
Nitrite: NEGATIVE
Protein, ur: NEGATIVE mg/dL
Specific Gravity, Urine: 1.025 (ref 1.005–1.030)
Urobilinogen, UA: 0.2 mg/dL (ref 0.0–1.0)
pH: 5.5 (ref 5.0–8.0)

## 2016-06-04 LAB — OB RESULTS CONSOLE GBS: GBS: NEGATIVE

## 2016-06-04 NOTE — BH Specialist Note (Signed)
Integrated Behavioral Health Initial Visit  MRN: [id number] Name: Kristin Hart   Session Start time: 2:05 Session End time: 2:15 Total time: 10 minutes  Type of Service: Integrated Behavioral Health- Individual/Family Interpretor:No. Interpretor Name and Language: n/a   Warm Hand Off Completed.       SUBJECTIVE: Kristin Beetsshley R Faires is a 36 y.o. female accompanied by FOB. Patient was referred by Dr Omer JackMumaw for past anxiety/depression. Patient reports the following symptoms/concerns: Pt states she has no concerns today, but open to taking educational materials today. Duration of problem: Undetermined; Severity of problem: n/a  OBJECTIVE: Mood: Appropriate and Affect: Appropriate Risk of harm to self or others: No plan to harm self or others   LIFE CONTEXT: Family and Social: Pt states that she has a great support system School/Work: - Self-Care: - Life Changes: Current pregnancy, first  GOALS ADDRESSED: Patient will maintain reduction in symptoms of: anxiety and depression    INTERVENTIONS: Supportive Counseling and Psychoeducation and/or Health Education  Standardized Assessments completed: GAD-7 and PHQ 9  ASSESSMENT: Patient currently experiencing History of depression.Patient may benefit from psychoeducation and brief supportive counseling today regarding maintaining reduction in depressive symptoms.  PLAN: 1. Follow up with behavioral health clinician on : one week 2. Behavioral recommendations:  -Consider reading educational material regarding coping with symptoms of depression (and anxiety), to discuss at next visit -Consider apps as additional self-coping strategy 3. Referral(s): [facility] 4. "From scale of 1-10, how likely are you to follow plan?": 8  Rae LipsJamie C Lenord Fralix, LCSWA   Depression screen [facility] 2/9 06/04/2016  Decreased Interest 0  Down, Depressed, Hopeless 0  PHQ - 2 Score 0  Altered sleeping 0  Tired,  decreased energy 0  Change in appetite 0  Feeling bad or failure about yourself  0  Trouble concentrating 0  Moving slowly or fidgety/restless 0  Suicidal thoughts 0  PHQ-9 Score 0   GAD 7 : Generalized Anxiety Score 06/04/2016  Nervous, Anxious, on Edge 2  Control/stop worrying 0  Worry too much - different things 0  Trouble relaxing 1  Restless 0  Easily annoyed or irritable 0  Afraid - awful might happen 0  Total GAD 7 Score 3

## 2016-06-04 NOTE — Patient Instructions (Addendum)
AREA PEDIATRIC/FAMILY [place] [address] [place], [place]  [address] Phone - [phone]   Fax - [phone]  [facility] [place] [address] [place], [place] [address] Phone - [phone]   Fax - [place] [address] [place] [address], [place]  [address] Phone - [phone]   Fax - [phone]  [place] [address], [place] [address] [place], [place]  [address] Phone - [phone]   Fax - [phone]  [place] [address] [place], [place]  [address] Phone - [phone]   Fax - [phone]  [facility] [address] [place], [place]  [address] Phone - [phone]   Fax - [place] [address], [place], [place]  [address] Phone - [phone]   Fax - [phone]  [place] [address] [place], [place] [address] [place], [place]  [address] Phone - [phone]   Fax - [place] [address] [place], [place]  [address] Phone - [phone]   Fax - [phone] [facility] [place]. [address] [place], [place]  [address] Phone - [phone]   Fax - [phone]  [place] [address], [place]  [address] Phone - [phone]   Fax - [phone]  [facility] AT [place], [place], [place]  [address] Phone - [phone]   Fax - [place] [address], [place], [place]  [address] Phone - [phone]   Fax - [phone]  [facility] [address] [place], [place]  [address] Phone - [place], [place]  [address] Phone - [phone]   Fax - [place] [address], [place], [place]  [address] Phone - [phone]   Fax - [phone]  [place] [address] [place], [place]  [address] Phone - [phone]   Fax - [place]. [place], [place]  [address] Phone - [phone]   Fax - [place], [place], [place]  [address] Phone - [phone]   Fax - [place] [address] [place], [place]  [address] Phone - [phone]   Fax - [phone]  [facility] RUBIN [address], [place], [place]  [address] Phone - [phone]   Fax - [place] [address] [place], [place]  [address] Phone - [phone]   Fax - [phone]  [place] [address] [place], [place]  [address] Phone - [phone]   Fax - [phone] [place] [address] [place],   [address] Phone - [phone]   Fax - [place] [address] [place],   [address] Phone - [phone]   Fax - [place] [address], [place],   [address] Phone - [phone]   Fax - [phone]  [place] [address] [place] [address] Phone [phone]  Fax [phone]   Places to have your son circumcised:    [place] [phone] by  4 wks  [facility] [phone] $244 by 4 wks  [facility] [phone] $175 by 2 wks  [place] [phone] $250 by 7 days [facility] [phone] $150 by 4 wks  These prices sometimes change but are roughly what you can expect to pay. Please call and  confirm pricing.   Circumcision is considered an elective/non-medically necessary procedure. There are many reasons parents decide to have their sons circumsized. During the first year of life circumcised males have a reduced risk of urinary tract infections but after this year the rates between circumcised males and uncircumcised males are the same.  It is safe to have your son circumcised outside of the hospital and the places above perform them regularly.    Type 1 or Type 2 Diabetes Mellitus During Pregnancy, Self Care When you have type 1 or type 2 diabetes (diabetes mellitus), you must keep your blood sugar (glucose) under control. You can do this with:  Nutrition.  Exercise.  Lifestyle changes.  Insulin or medicines, if needed.  Support from your doctors and others. How do I manage my blood sugar?  Check your blood sugar every day, as often as told.  Call your doctor if your blood sugar is above your goal numbers for 2 tests in a row.  Have your A1c (hemoglobin A1c) level checked at least two times a year. Have it checked more often if your doctor tells you to do that. Your doctor will set treatment goals for you. In general, you should have these blood sugar levels:  After not eating for a long time (fasting): 95 mg/dL (5.3 mmol/L).  After meals (postprandial):  One hour after a meal: at or below 140 mg/dL (7.8 mmol/L).  Two hours after a meal: at or below 120 mg/dL (6.7 mmol/L).  A1c level: 6-6.5%. What do I need to know about high blood sugar? High blood sugar is called hyperglycemia. Know the signs of high blood sugar. Signs may include:  Feeling:  Thirsty.  Hungry.  Very tired.  Needing to pee (urinate) more than usual.  Blurry vision. What do I need to know about low blood sugar? Low blood sugar is called hypoglycemia. This is when blood sugar is at or below 70 mg/dL (3.9 mmol/L). Symptoms may include:  Feeling:  Hungry.  Worried or nervous  (anxious).  Sweaty and clammy.  Confused.  Dizzy.  Sleepy.  Sick to your stomach (nauseous).  Having:  A fast heartbeat.  A headache.  A change in your vision.  Jerky movements that you cannot control (seizure).  Nightmares.  Tingling or no feeling (numbness) around the mouth, lips, or tongue.  Having trouble with:  Talking.  Paying attention (concentrating).  Moving (coordination).  Sleeping.  Shaking.  Passing out (fainting).  Getting upset easily (irritability). Treating low blood sugar   To treat low blood sugar, eat or drink something sugary right away. If you can think clearly and swallow safely, follow the 15:15 rule:  Take 15 grams of a fast-acting carb (carbohydrate). Some fast-acting carbs are:  1 tube of glucose gel.  3 sugar tablets (glucose pills).  6-8 pieces of hard candy.  4 oz (120 mL) of fruit juice.  4 oz (120 mL) regular (not diet) soda.  Check your blood sugar 15 minutes after you take the carb.  If your blood sugar is still at or below 70 mg/dL (3.9 mmol/L), take 15 grams of a carb again.  If your blood sugar does not go above 70 mg/dL (3.9 mmol/L) after  3 tries, get help right away.  After your blood sugar goes back to normal, eat a meal or a snack within 1 hour. Treating very low blood sugar  If your blood sugar is at or below 54 mg/dL (3 mmol/L), you have very low blood sugar (severe hypoglycemia). This is an emergency. Do not wait to see if the symptoms will go away. Get medical help right away. Call your local emergency services (911 in the U.S.). Do not drive yourself to the hospital. If you have very low blood sugar and you cannot eat or drink, you may need a glucagon shot (injection). A family member or friend should learn:  How to check your blood sugar.  How to give you a glucagon shot. Ask your doctor if you need a glucagon shot kit at home. What else is important to manage my diabetes? Medicine  Follow these  instructions about insulin and diabetes medicines:  Take them as told by your doctor.  Adjust them as told by your doctor.  Do not run out of them. Having diabetes can put you at risk for other long-term (chronic) conditions. These may include heart disease and kidney disease. Your doctor may prescribe medicines to help prevent problems from diabetes. Food    Make healthy food choices. These include:  Chicken, fish, egg whites, and beans.  Oats, whole wheat, bulgur, brown rice, quinoa, and millet.  Fresh fruits and vegetables.  Low-fat dairy products.  Nuts, avocado, olive oil, and canola oil.  Meet with a food specialist (dietitian) to make an eating plan that is right for you.  Follow instructions from your doctor about what you cannot eat or drink.  Drink enough fluid to keep your pee (urine) clear or pale yellow.  Eat healthy snacks between healthy meals.  Keep track of the carbs you eat. Read food labels. Learn the standard serving sizes of foods.  Follow your sick day plan when you cannot eat or drink normally. Make this plan with your doctor so it is ready. Activity   Exercise for 30 minutes or more a day during your pregnancy or as much as told by your doctor.  Talk with your doctor before you start a new exercise. Your doctor may need to adjust your insulin, medicines, or food. Lifestyle    Do not drink alcohol.  Do not use any tobacco products, such as cigarettes, chewing tobacco, and e-cigarettes. If you need help quitting, ask your doctor.  Learn how to deal with stress. If you need help with this, ask your doctor. Body care   Stay up to date with your shots (immunizations).  Get an eye exam during your first trimester.  Check your skin and feet every day. Check for cuts, bruises, redness, blisters, or sores.  Get regular foot exams as told by your doctor.  Brush your teeth and gums two times a day. Floss at least one time a day.  Go to the  dentist least once every 6 months.  Stay at a healthy weight during your pregnancy. General instructions    Take over-the-counter and prescription medicines only as told by your doctor.  Talk with your doctor about your risk for high blood pressure during pregnancy (preeclampsia or eclampsia).  Share your diabetes care plan with:  Your work or school.  People you live with.  Check your pee for ketones:  When you are sick.  As told by your doctor.  Carry a card or wear jewelry that says that you  have diabetes.  Ask your doctor:  Do I need to meet with a diabetes educator?  Where can I find a support group for people with diabetes?  Keep all follow-up visits with your doctor. This is important. Where to find more information: To learn more about diabetes, visit:  American Diabetes Association: www.diabetes.org  American Association of Diabetes Educators (AADE): www.diabeteseducator.org/patient-resources This information is not intended to replace advice given to you by your health care provider. Make sure you discuss any questions you have with your health care provider. Document Released: 06/20/2015 Document Revised: 01/11/2016 Document Reviewed: 04/01/2015 Elsevier Interactive Patient Education  2017 [employer].

## 2016-06-04 NOTE — Progress Notes (Signed)
Patient has been incarcerated since last visit [place]- has GDM and needs to start testing  Patient and/or legal guardian verbally consented to meet with Behavioral Health Clinician about presenting concerns.

## 2016-06-04 NOTE — Progress Notes (Signed)
Subjective:  Kristin Hart is a 36 y.o. G1P0 at 5160w1d being seen today for ongoing prenatal care.  She is currently monitored for the following issues for this high-risk pregnancy and has Supervision of normal first pregnancy; Diabetes mellitus affecting pregnancy; Hypothyroidism affecting pregnancy; Asthma affecting pregnancy, antepartum; Mental health disorder; Constipation during pregnancy, second trimester; History of methamphetamine use; and Low vitamin D level on her problem list.  Patient reports no complaints. FOB present for appointment.  Contractions: Not present. Vag. Bleeding: None.  Movement: Present. Denies leaking of fluid. Notices [employer] [facility] occasionally. [facility]. No drug use history or cigarette smoking. Last meal/drink was 11:45 am (lunch), CBG today is 2 hr after lunch. History of depression, has not been medicated for 3 years. No suicidal ideations, but would like to meet with Asher MuirJamie, got teary when talking about her history of depression. Admits to marijuana use, gave consent for UDS, has history of meth use.   The following portions of the patient's history were reviewed and updated as appropriate: allergies, current medications, past family history, past medical history, past social history, past surgical history and problem list. Problem list updated.  Objective:   Vitals:   06/04/16 1259  BP: 111/66  Pulse: 86  Weight: 210 lb 12.8 oz (95.6 kg)    Fetal Status: Fetal Heart Rate (bpm): 128   Movement: Present    Fundal height: 34 cm.  General:  Alert, oriented and cooperative. Patient is in no acute distress.  Skin: Skin is warm and dry. No rash noted.   Cardiovascular: Normal heart rate noted  Respiratory: Normal respiratory effort, no problems with respiration noted  Abdomen: Soft, gravid, appropriate for gestational age. Pain/Pressure: Present     Pelvic:  Cervical exam deferred        Extremities: Normal  range of motion.  Edema: Moderate pitting, indentation subsides rapidly  Mental Status: Normal mood and affect. Normal behavior. Normal judgment and thought content.   Urinalysis: Urine Protein: Negative Urine Glucose: 1+  Assessment and Plan:  Pregnancy: G1P0 at 6860w1d  1. Encounter for supervision of normal first pregnancy in third trimester - Received care a [place], was incarcerated, [facility] - TSH - T3 - T4 - US Fetal Echocardiography; Future - US MFM OB COMP + 14 WK; Future  2. Diabetes mellitus affecting pregnancy in third trimester - Newly diagnosed, received glucometer/strips today, discussed to monitor FBS, and 2 hr PP for breakfast, lunch, dinner, total of 4 times per day, to bring back log at next visit. - Metformin Rx, 500mg  BID - US Fetal Echocardiography; Future - US MFM OB COMP + 14 WK; Future - Fetal nonstress test  3. Hypothyroidism affecting pregnancy in third trimester - Diagnosed prior to pregnancy, not taking any medication currently - TSH - T3 - T4 - US MFM OB COMP + 14 WK; Future  4. History of methamphetamine use - Has not used in years, only smokes marijuana  5. Depression affecting pregnancy in third trimester, antepartum - Meeting with BHT, [name], today. Does not want any medications, not suicidal. FOB support, at appointment today.  6. Marijuana use - Admits   Preterm labor symptoms and general obstetric precautions including but not limited to vaginal bleeding, contractions, leaking of fluid and fetal movement were reviewed in detail with the patient. Please refer to After Visit Summary for other counseling recommendations.  Return in about 3 days (around 06/07/2016) for NST/AFI;  4/2  Ob fu and NST.   Michaele Offer, DO OB Fellow Center for [facility], [facility]

## 2016-06-05 LAB — DRUG SCREEN, URINE
Amphetamines, Urine: NEGATIVE ng/mL
Barbiturate screen, urine: NEGATIVE ng/mL
Benzodiazepine Quant, Ur: NEGATIVE ng/mL
Cannabinoid Quant, Ur: POSITIVE ng/mL
Cocaine (Metab.): NEGATIVE ng/mL
Opiate Quant, Ur: NEGATIVE ng/mL
PCP Quant, Ur: NEGATIVE ng/mL

## 2016-06-05 LAB — T4: T4, Total: 8 ug/dL (ref 4.5–12.0)

## 2016-06-05 LAB — TSH: TSH: 2.73 u[IU]/mL (ref 0.450–4.500)

## 2016-06-05 LAB — T3: T3, Total: 221 ng/dL — ABNORMAL HIGH (ref 71–180)

## 2016-06-06 ENCOUNTER — Encounter: Payer: Self-pay | Admitting: *Deleted

## 2016-06-07 ENCOUNTER — Ambulatory Visit (INDEPENDENT_AMBULATORY_CARE_PROVIDER_SITE_OTHER): Payer: Medicaid Other | Admitting: Obstetrics and Gynecology

## 2016-06-07 ENCOUNTER — Encounter: Payer: Self-pay | Admitting: Family Medicine

## 2016-06-07 ENCOUNTER — Ambulatory Visit: Payer: Self-pay

## 2016-06-07 VITALS — BP 116/58 | HR 81

## 2016-06-07 DIAGNOSIS — F32A Depression, unspecified: Secondary | ICD-10-CM | POA: Insufficient documentation

## 2016-06-07 DIAGNOSIS — O24913 Unspecified diabetes mellitus in pregnancy, third trimester: Secondary | ICD-10-CM | POA: Diagnosis not present

## 2016-06-07 DIAGNOSIS — F1290 Cannabis use, unspecified, uncomplicated: Secondary | ICD-10-CM | POA: Insufficient documentation

## 2016-06-07 DIAGNOSIS — F329 Major depressive disorder, single episode, unspecified: Secondary | ICD-10-CM | POA: Insufficient documentation

## 2016-06-07 NOTE — Progress Notes (Signed)
Pt informed that the ultrasound is considered a limited OB ultrasound and is not intended to be a complete ultrasound exam.  Patient also informed that the ultrasound is not being completed with the intent of assessing for fetal or placental anomalies or any pelvic abnormalities.  Explained that the purpose of today's ultrasound is to assess for presentation and amniotic fluid volume.  Patient acknowledges the purpose of the exam and the limitations of the study.    Pt expressed concerns regarding elevated CBG readings. She reports that she eats many meals @ food shelters and [employer] and therefore is not always able to follow GDM diet as instructed. She says she is trying to "cut back" on carbohydrate intake but she doesn't have much choice in the food which is available to her. Pt is worried that she will need to take insulin. She was given support and encouragement for her efforts to maintain GDM diet.  I told her that there is still possibility of changes to dosage of Metformin prior to needing insulin. This gave some reassurance to pt. CBG log reviewed however pt has only checked 4 times total since 3/26. Pt agreed to check CBG 4 times daily and will bring log to appt on 4/2.

## 2016-06-07 NOTE — Progress Notes (Signed)
NST performed today due to routine monitoring for GDM-B.  I personally reviewed the patient's NST today, found to be REACTIVE. 135 bpm, mod var, +accels, no decels. CTX: None.   Follow up as scheduled.  Cleda Clarks, DO  OB Fellow Center for [facility], [facility]

## 2016-06-11 ENCOUNTER — Ambulatory Visit (INDEPENDENT_AMBULATORY_CARE_PROVIDER_SITE_OTHER): Payer: Medicaid Other | Admitting: Clinical

## 2016-06-11 ENCOUNTER — Ambulatory Visit (INDEPENDENT_AMBULATORY_CARE_PROVIDER_SITE_OTHER): Payer: Medicaid Other | Admitting: Family Medicine

## 2016-06-11 VITALS — BP 118/70 | HR 78 | Wt 210.8 lb

## 2016-06-11 DIAGNOSIS — O99283 Endocrine, nutritional and metabolic diseases complicating pregnancy, third trimester: Secondary | ICD-10-CM | POA: Diagnosis not present

## 2016-06-11 DIAGNOSIS — F4322 Adjustment disorder with anxiety: Secondary | ICD-10-CM | POA: Diagnosis not present

## 2016-06-11 DIAGNOSIS — Z87898 Personal history of other specified conditions: Secondary | ICD-10-CM

## 2016-06-11 DIAGNOSIS — K5900 Constipation, unspecified: Secondary | ICD-10-CM

## 2016-06-11 DIAGNOSIS — J45909 Unspecified asthma, uncomplicated: Secondary | ICD-10-CM

## 2016-06-11 DIAGNOSIS — O99513 Diseases of the respiratory system complicating pregnancy, third trimester: Secondary | ICD-10-CM

## 2016-06-11 DIAGNOSIS — O99282 Endocrine, nutritional and metabolic diseases complicating pregnancy, second trimester: Secondary | ICD-10-CM

## 2016-06-11 DIAGNOSIS — O99613 Diseases of the digestive system complicating pregnancy, third trimester: Secondary | ICD-10-CM

## 2016-06-11 DIAGNOSIS — O99612 Diseases of the digestive system complicating pregnancy, second trimester: Secondary | ICD-10-CM

## 2016-06-11 DIAGNOSIS — F1591 Other stimulant use, unspecified, in remission: Secondary | ICD-10-CM

## 2016-06-11 DIAGNOSIS — O24913 Unspecified diabetes mellitus in pregnancy, third trimester: Secondary | ICD-10-CM | POA: Diagnosis present

## 2016-06-11 DIAGNOSIS — F99 Mental disorder, not otherwise specified: Secondary | ICD-10-CM

## 2016-06-11 DIAGNOSIS — O24912 Unspecified diabetes mellitus in pregnancy, second trimester: Secondary | ICD-10-CM

## 2016-06-11 DIAGNOSIS — Z3402 Encounter for supervision of normal first pregnancy, second trimester: Secondary | ICD-10-CM

## 2016-06-11 DIAGNOSIS — E039 Hypothyroidism, unspecified: Secondary | ICD-10-CM | POA: Diagnosis not present

## 2016-06-11 DIAGNOSIS — O99519 Diseases of the respiratory system complicating pregnancy, unspecified trimester: Secondary | ICD-10-CM

## 2016-06-11 DIAGNOSIS — Z3403 Encounter for supervision of normal first pregnancy, third trimester: Secondary | ICD-10-CM

## 2016-06-11 MED ORDER — ALBUTEROL SULFATE HFA 108 (90 BASE) MCG/ACT IN AERS: 2.0000 | INHALATION_SPRAY | Freq: Four times a day (QID) | RESPIRATORY_TRACT | 2 refills | Status: DC | PRN

## 2016-06-11 MED ORDER — OB COMPLETE PETITE 35-5-1-200 MG PO CAPS: 1.0000 | ORAL_CAPSULE | Freq: Every day | ORAL | 12 refills | Status: DC

## 2016-06-11 MED ORDER — METFORMIN HCL 1000 MG PO TABS: 1000.0000 mg | ORAL_TABLET | Freq: Two times a day (BID) | ORAL | 3 refills | Status: DC

## 2016-06-11 NOTE — BH Specialist Note (Addendum)
Integrated Behavioral Health Follow Up Visit  MRN: [id number] Name: Kristin Hart   Session Start time: 3:30 Session End time: 4:00 Total time: 30 minutes Number of Integrated Behavioral Health Clinician visits: 2/10  Type of Service: Integrated Behavioral Health- Individual/Family Interpretor:No. Interpretor Name and Language: n/a   Warm Hand Off Completed.       SUBJECTIVE: Kristin Hart is a 36 y.o. female accompanied by FOB. Patient was referred by Dr Omer Jack for past anxiety and depression. Patient reports the following symptoms/concerns: Pt states that she has concerns about the uncertainties of labor itself, as she gets closer to due date; copes by having supportive FOB and family. Pt also has concerns about transportation, filing for disability, preparing for postpartum emotions. Duration of problem: Increase in past week.  Severity of problem: mild  OBJECTIVE: Mood: Anxious and Affect: Appropriate(slightly teary when speaking of fear of labor) Risk of harm to self or others: No plan to harm self or others   LIFE CONTEXT: Family and Social: Pt says she has a great support system School/Work: Thinking about filing for disability Self-Care: Surrounds herself with supportive people Life Changes: Current pregnancy  GOALS ADDRESSED: Patient will reduce symptoms of: anxiety and increase knowledge and/or ability of: coping skills and also: Increase healthy adjustment to current life circumstances  INTERVENTIONS: Mindfulness or Relaxation Training and Link to [employer] Standardized Assessments completed: GAD-7 and PHQ 9  ASSESSMENT: Patient currently experiencing Adjustment disorder with anxious mood. Patient may benefit from psychoeducation and brief therapeutic intervenention regarding coping with current symptoms of anxiety today, along with community resources.   PLAN: 1. Follow up with behavioral health clinician on : Two weeks 2. Behavioral  recommendations:  -Continue daily walks with FOB -Consider establishing care with [facility] [place] prior to giving birth, for continuity of care/prevention -Consider childbirth education classes at [facility] or [facility] -Consider practicing CALM relaxation breathing exercise daily to cope with increasing daily worry -Consider inquiring at food banks about fresh produce availability -Consider signing up to talk to disability lawyers at next visit to the [facility] -Consider using Postpartum Planner with FOB and his family, as a guide to plan for postpartum -Consider setting up Medicaid transportation services for medical appointments -Consider obtaining reduced-fare bus ID at [employer] -Consider [employer] for additional community resources 3. Referral(s): Integrated [employer] (In Clinic) 4. "From scale of 1-10, how likely are you to follow plan?": 8  Rae Lips, LCSWA   Depression screen [facility] 2/9 06/07/2016 06/04/2016  Decreased Interest 0 0  Down, Depressed, Hopeless 0 0  PHQ - 2 Score 0 0  Altered sleeping 0 0  Tired, decreased energy 0 0  Change in appetite 0 0  Feeling bad or failure about yourself  0 0  Trouble concentrating 0 0  Moving slowly or fidgety/restless 0 0  Suicidal thoughts 0 0  PHQ-9 Score 0 0   GAD 7 : Generalized Anxiety Score 06/07/2016 06/04/2016  Nervous, Anxious, on Edge 1 2  Control/stop worrying 0 0  Worry too much - different things 0 0  Trouble relaxing 0 1  Restless 0 0  Easily annoyed or irritable 0 0  Afraid - awful might happen 0 0  Total GAD 7 Score 1 3

## 2016-06-11 NOTE — Progress Notes (Signed)
Subjective:  Kristin Hart is a 36 y.o. G1P0 at [redacted]w[redacted]d being seen today for ongoing prenatal care.  She is currently monitored for the following issues for this high-risk pregnancy and has Supervision of normal first pregnancy; Diabetes mellitus affecting pregnancy; Hypothyroidism affecting pregnancy; Asthma affecting pregnancy, antepartum; Mental health disorder; Constipation during pregnancy, second trimester; History of methamphetamine use; Low vitamin D level; Depression affecting pregnancy in third trimester, antepartum; and Marijuana use on her problem list.  GDM: Patient taking metformin  BID.  Reports one hypoglycemic episodes.  Tolerating medication well Fasting: 106-128 2hr PP: 113-193  Patient reports no complaints.  Contractions: Not present. Vag. Bleeding: None.  Movement: Present. Denies leaking of fluid.   The following portions of the patient's history were reviewed and updated as appropriate: allergies, current medications, past family history, past medical history, past social history, past surgical history and problem list. Problem list updated.  Objective:   Vitals:   06/11/16 1456  BP: 118/70  Pulse: 78  Weight: 210 lb 12.8 oz (95.6 kg)    Fetal Status: Fetal Heart Rate (bpm): NST   Movement: Present     General:  Alert, oriented and cooperative. Patient is in no acute distress.  Skin: Skin is warm and dry. No rash noted.   Cardiovascular: Normal heart rate noted  Respiratory: Normal respiratory effort, no problems with respiration noted  Abdomen: Soft, gravid, appropriate for gestational age. Pain/Pressure: Present     Pelvic: Vag. Bleeding: None     Cervical exam deferred        Extremities: Normal range of motion.  Edema: Moderate pitting, indentation subsides rapidly  Mental Status: Normal mood and affect. Normal behavior. Normal judgment and thought content.   Urinalysis:      Assessment and Plan:  Pregnancy: G1P0 at [redacted]w[redacted]d  1. Encounter for  supervision of normal first pregnancy in third trimester FHT and FH normal  2. Diabetes mellitus affecting pregnancy in third trimester Increase metfromin to  BID Continue antenatal testing. NST reactive - [place] MFM FETAL BPP WO NON STRESS; Future  Preterm labor symptoms and general obstetric precautions including but not limited to vaginal bleeding, contractions, leaking of fluid and fetal movement were reviewed in detail with the patient. Please refer to After Visit Summary for other counseling recommendations.  Return in about 11 days (around 06/22/2016) for Ob fu and NST.   [name], DO

## 2016-06-11 NOTE — Progress Notes (Signed)
[place] for growth on 4/6 - BPP added.

## 2016-06-13 ENCOUNTER — Encounter: Payer: Self-pay | Admitting: *Deleted

## 2016-06-13 DIAGNOSIS — Z87898 Personal history of other specified conditions: Secondary | ICD-10-CM

## 2016-06-13 DIAGNOSIS — F1591 Other stimulant use, unspecified, in remission: Secondary | ICD-10-CM

## 2016-06-13 DIAGNOSIS — F99 Mental disorder, not otherwise specified: Secondary | ICD-10-CM

## 2016-06-13 DIAGNOSIS — O99612 Diseases of the digestive system complicating pregnancy, second trimester: Secondary | ICD-10-CM

## 2016-06-13 DIAGNOSIS — J45909 Unspecified asthma, uncomplicated: Secondary | ICD-10-CM

## 2016-06-13 DIAGNOSIS — O99519 Diseases of the respiratory system complicating pregnancy, unspecified trimester: Secondary | ICD-10-CM

## 2016-06-13 DIAGNOSIS — K5900 Constipation, unspecified: Secondary | ICD-10-CM

## 2016-06-15 ENCOUNTER — Ambulatory Visit (HOSPITAL_COMMUNITY)
Admission: RE | Admit: 2016-06-15 | Discharge: 2016-06-15 | Disposition: A | Discharge status: Home / Self Care | Payer: Medicaid Other | Source: Ambulatory Visit | Attending: Family Medicine | Admitting: Family Medicine

## 2016-06-15 ENCOUNTER — Other Ambulatory Visit: Payer: Self-pay | Admitting: Family Medicine

## 2016-06-15 DIAGNOSIS — Z3403 Encounter for supervision of normal first pregnancy, third trimester: Secondary | ICD-10-CM

## 2016-06-15 DIAGNOSIS — O24415 Gestational diabetes mellitus in pregnancy, controlled by oral hypoglycemic drugs: Secondary | ICD-10-CM | POA: Insufficient documentation

## 2016-06-15 DIAGNOSIS — O99343 Other mental disorders complicating pregnancy, third trimester: Secondary | ICD-10-CM | POA: Insufficient documentation

## 2016-06-15 DIAGNOSIS — O0933 Supervision of pregnancy with insufficient antenatal care, third trimester: Secondary | ICD-10-CM | POA: Diagnosis present

## 2016-06-15 DIAGNOSIS — O24913 Unspecified diabetes mellitus in pregnancy, third trimester: Secondary | ICD-10-CM

## 2016-06-15 DIAGNOSIS — Z363 Encounter for antenatal screening for malformations: Secondary | ICD-10-CM | POA: Insufficient documentation

## 2016-06-15 DIAGNOSIS — O99283 Endocrine, nutritional and metabolic diseases complicating pregnancy, third trimester: Secondary | ICD-10-CM | POA: Diagnosis not present

## 2016-06-15 DIAGNOSIS — E039 Hypothyroidism, unspecified: Secondary | ICD-10-CM | POA: Insufficient documentation

## 2016-06-15 DIAGNOSIS — Z3A35 35 weeks gestation of pregnancy: Secondary | ICD-10-CM

## 2016-06-15 IMAGING — US US MFM FETAL BPP W/O NON-STRESS
1 series · 14 of 28 positions shown · non-contrast
Comparison: none

[Series 1: us mfm fetal bpp w/o non-stress · 82 acquisitions, 14 frames shown]
[im 4/82]
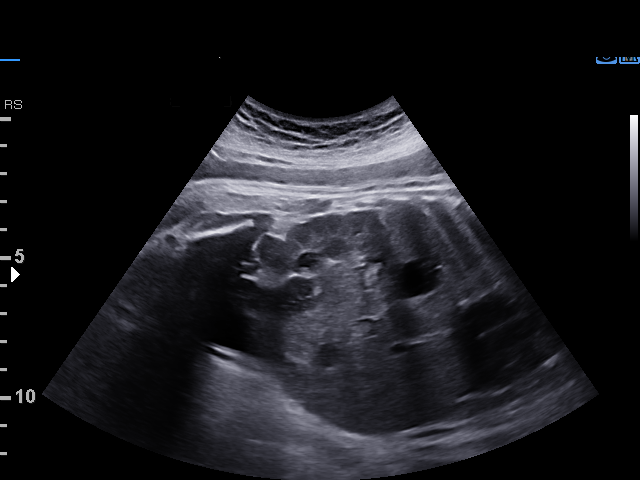
[im 10/82]
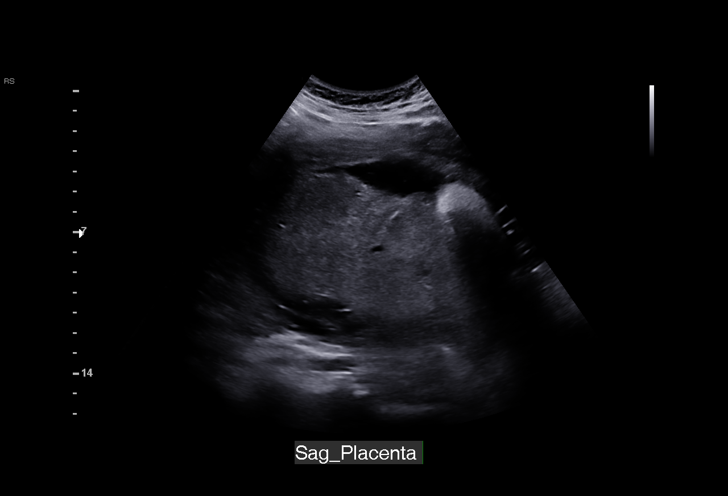
[im 16/82]
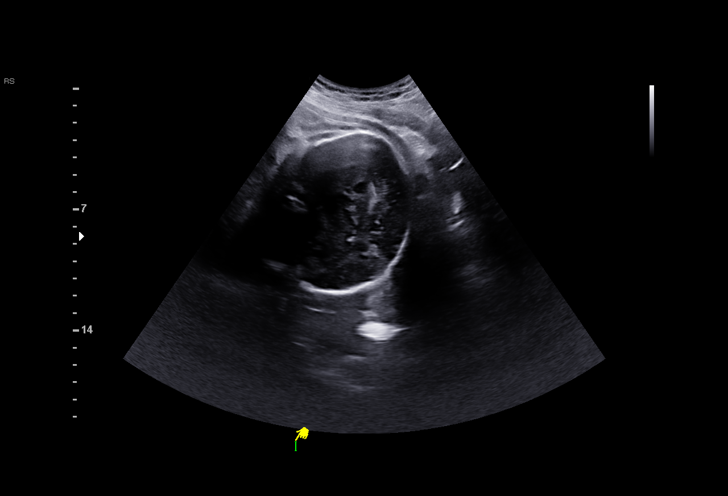
[im 22/82]
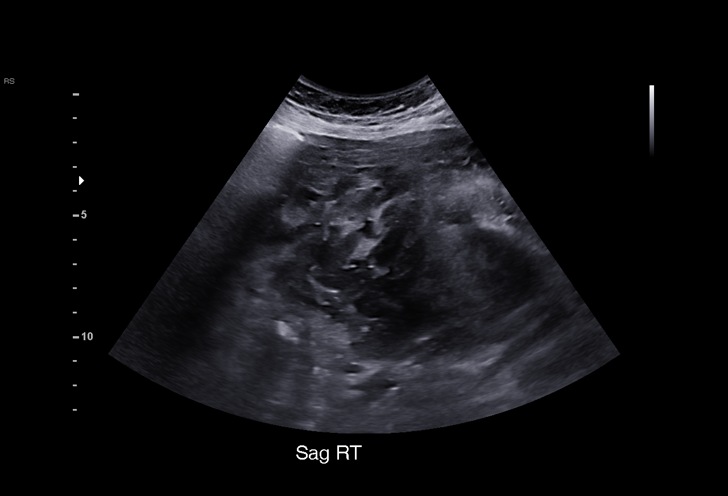
[im 28/82]
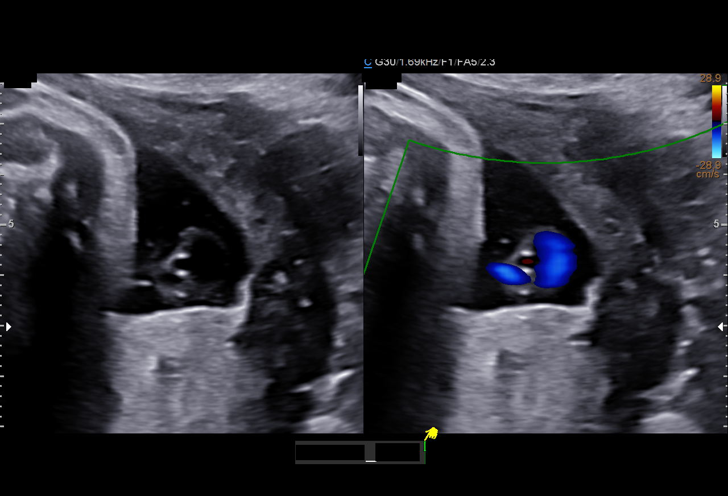
[im 34/82]
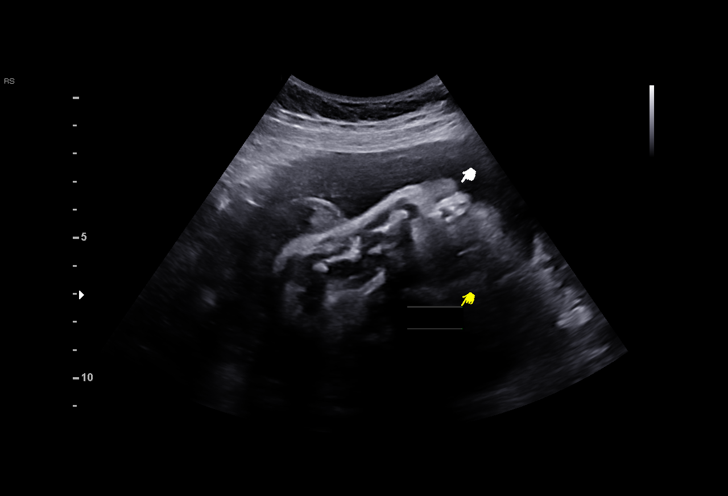
[im 40/82]
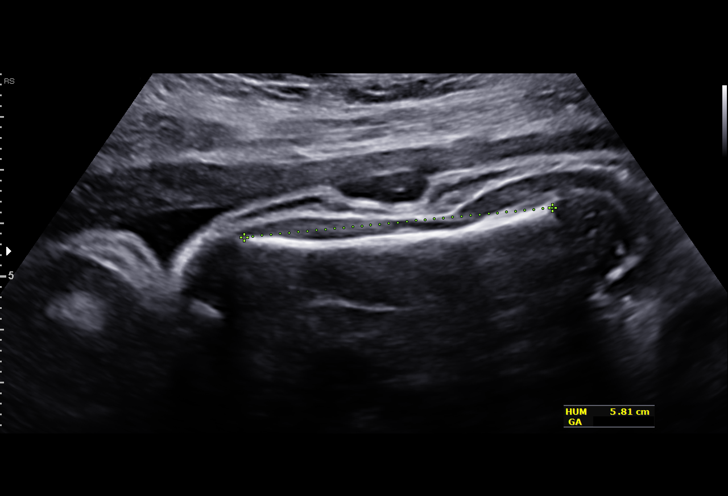
[im 46/82]
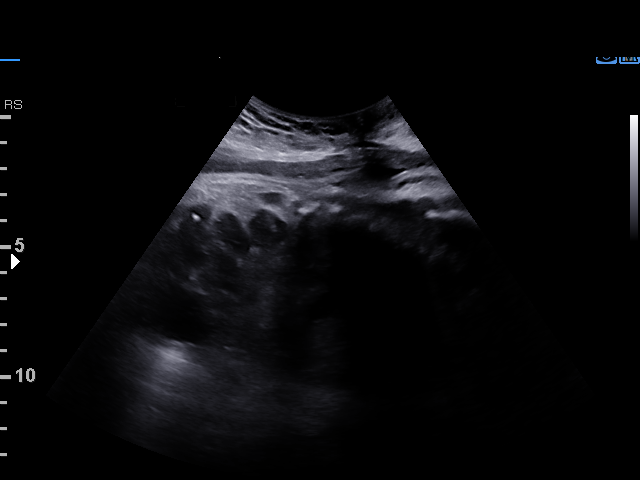
[im 52/82]
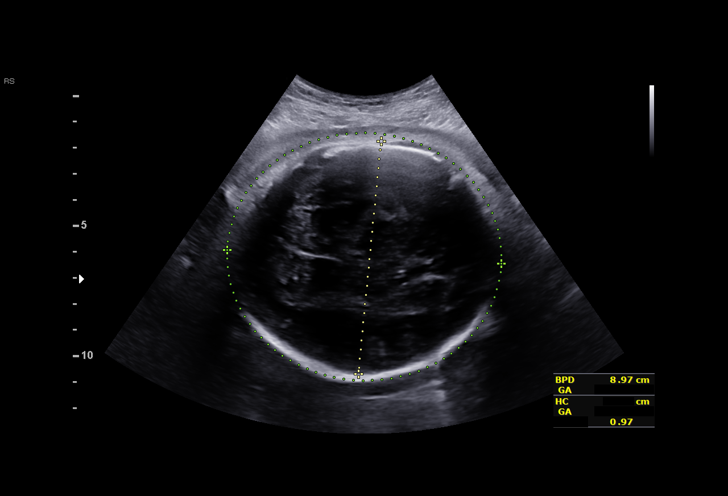
[im 58/82]
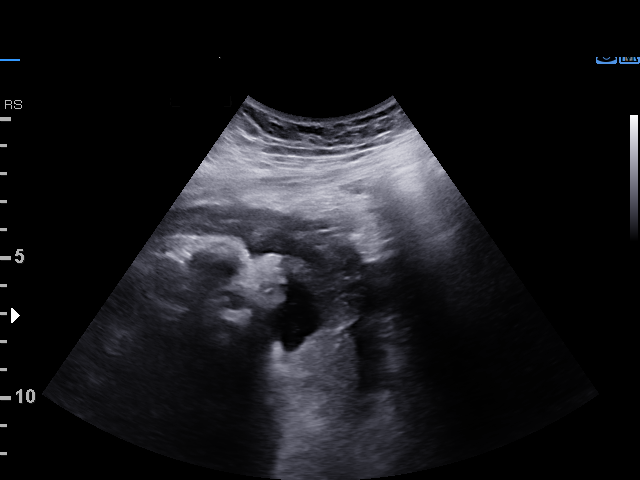
[im 64/82]
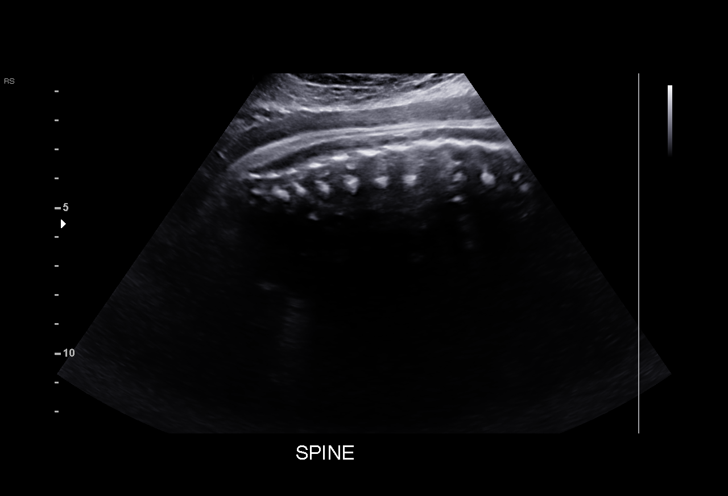
[im 70/82]
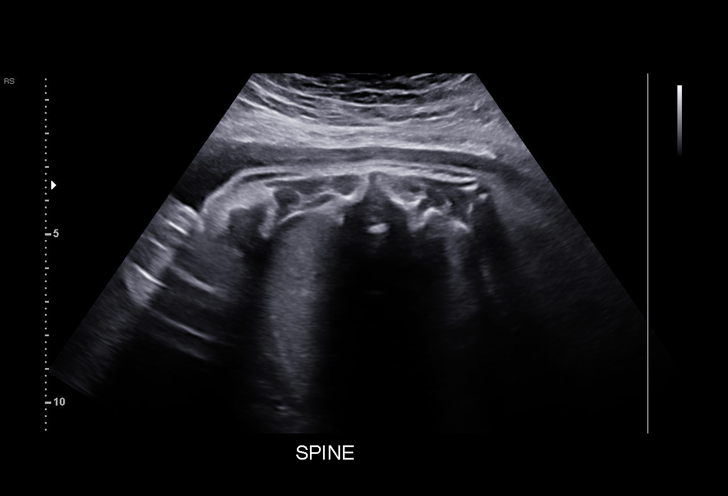
[im 76/82]
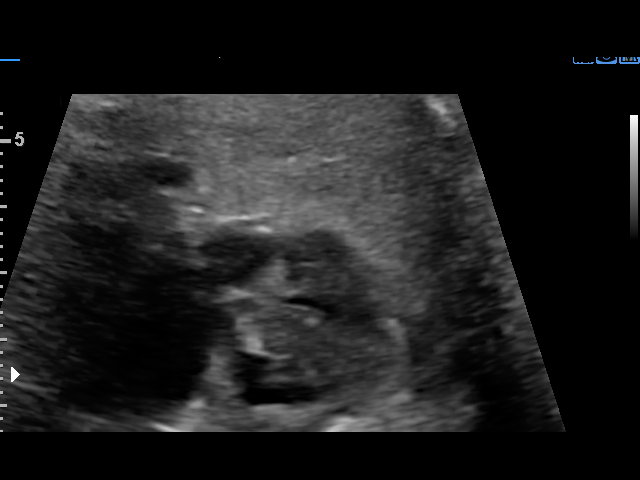
[im 82/82]
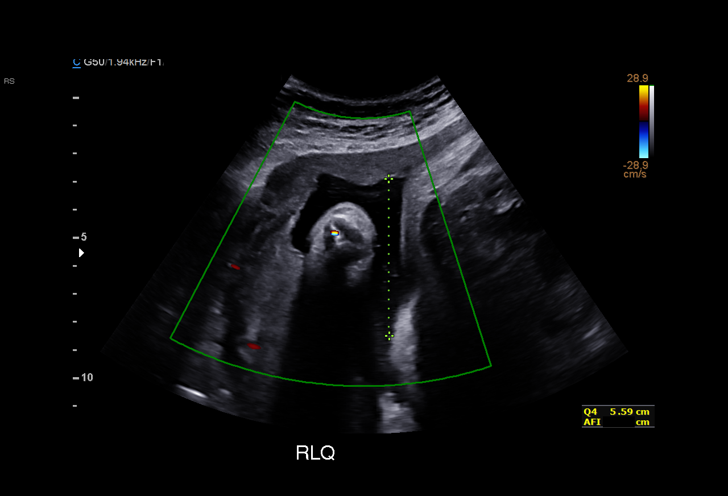

[14 of 28 positions shown; findings below may reference images not displayed]

OB/Gyn Clinic

1  TRAINOTTI          [PHONE_NUMBER]      [PHONE_NUMBER]     [PHONE_NUMBER]
Indications

35 weeks gestation of pregnancy
Encounter for antenatal screening for          [9G]
malformations
Gestational diabetes in pregnancy,             [9G]
controlled by oral hypoglycemic drugs
Late to prenatal care, third trimester         [9G]
Drug use complicating pregnancy, third         [9G]
trimester (marijuana)
Other mental disorder complicating             [9G]
pregnancy, third trimester (depression/
anxiety)
Hypothyroid                                    [9G] [9G]
Fetal Evaluation

Num Of Fetuses:     1
Fetal Heart         143
Rate(bpm):
Cardiac Activity:   Observed
Presentation:       Cephalic
Placenta:           Right lateral/ post above cervical os
P. Cord Insertion:  Not well visualized
Amniotic Fluid
AFI FV:      Subjectively within normal limits

AFI Sum(cm)     %Tile       Largest Pocket(cm)
15.56           57

RUQ(cm)       RLQ(cm)       LUQ(cm)        LLQ(cm)
2.22
Biophysical Evaluation

Amniotic F.V:   Pocket => 2 cm two         F. Tone:        Observed
planes
F. Movement:    Observed                   Score:          [DATE]
F. Breathing:   Observed
Biometry

BPD:      88.3  mm     G. Age:  35w 5d         56  %    CI:        77.59   %    70 - 86
FL/HC:      19.9   %    20.1 -
HC:      317.3  mm     G. Age:  35w 5d         20  %    HC/AC:      0.98        0.93 -
AC:      323.2  mm     G. Age:  36w 2d         74  %    FL/BPD:     71.7   %    71 - 87
FL:       63.3  mm     G. Age:  32w 5d        < 3  %    FL/AC:      19.6   %    20 - 24
HUM:      58.7  mm     G. Age:  34w 0d         34  %
Est. FW:    [9G]  gm    5 lb 13 oz      53  %
Gestational Age

LMP:           35w 5d        Date:  [DATE]                 EDD:   [DATE]
U/S Today:     35w 1d                                        EDD:   [DATE]
Best:          35w 5d     Det. By:  LMP  ([DATE])          EDD:   [DATE]
Anatomy

Cranium:               Not well visualized    Aortic Arch:            Not well visualized
Cavum:                 Not well visualized    Ductal Arch:            Not well visualized
Ventricles:            Not well visualized    Diaphragm:              Not well visualized
Choroid Plexus:        Appears normal         Stomach:                Appears normal, left
sided
Cerebellum:            Not well visualized    Abdomen:                Appears normal
Posterior Fossa:       Not well visualized    Abdominal Wall:         Not well visualized
Nuchal Fold:           Not applicable (>20    Cord Vessels:           Appears normal (3
wks GA)                                        vessel cord)
Face:                  Not well visualized    Kidneys:                Appear normal
Lips:                  Appears normal         Bladder:                Appears normal
Thoracic:              Appears normal         Spine:                  Limited views
appear normal
Heart:                 Not well visualized    Upper Extremities:      Visualized
RVOT:                  Not well visualized    Lower Extremities:      LLE seen, RLE not
well vis
LVOT:                  Not well visualized

Other:  Fetus appears to be a male. Technically difficult due to advanced GA
and fetal position.
Cervix Uterus Adnexa

Cervix
Not visualized (advanced GA >[9G])
Uterus
No abnormality visualized.

Left Ovary
Not visualized.

Right Ovary
Not visualized.

Adnexa:       No abnormality visualized. No adnexal mass
visualized.
Impression

Single living intrauterine pregnancy at 35 weeks 5 days.
Biometry consistent with EDC based on LMP.
Appropriate fetal growth (53%)
Normal amniotic fluid volume.
The fetal anatomic survey is not complete.
No gross fetal anomalies identified.
BPP [DATE].
Recommendations

Follow-up ultrasounds as clinically indicated.
Recommend weekly antenatal fetal testing until delivery.

## 2016-06-22 ENCOUNTER — Ambulatory Visit (INDEPENDENT_AMBULATORY_CARE_PROVIDER_SITE_OTHER): Payer: Medicaid Other | Admitting: Obstetrics and Gynecology

## 2016-06-22 ENCOUNTER — Encounter: Payer: Self-pay | Admitting: Obstetrics and Gynecology

## 2016-06-22 ENCOUNTER — Ambulatory Visit: Payer: Medicaid Other | Admitting: Clinical

## 2016-06-22 ENCOUNTER — Inpatient Hospital Stay (HOSPITAL_COMMUNITY)
Admission: AD | Admit: 2016-06-22 | Discharge: 2016-06-22 | Disposition: A | Discharge status: Home / Self Care | Payer: Medicaid Other | Source: Ambulatory Visit | Attending: Obstetrics and Gynecology | Admitting: Obstetrics and Gynecology

## 2016-06-22 ENCOUNTER — Other Ambulatory Visit (HOSPITAL_COMMUNITY)
Admission: RE | Admit: 2016-06-22 | Discharge: 2016-06-22 | Disposition: A | Discharge status: Home / Self Care | Payer: Medicaid Other | Source: Ambulatory Visit | Attending: Obstetrics and Gynecology | Admitting: Obstetrics and Gynecology

## 2016-06-22 ENCOUNTER — Encounter (HOSPITAL_COMMUNITY): Payer: Self-pay

## 2016-06-22 VITALS — BP 129/77 | HR 86 | Wt 215.8 lb

## 2016-06-22 DIAGNOSIS — Z3403 Encounter for supervision of normal first pregnancy, third trimester: Secondary | ICD-10-CM | POA: Diagnosis not present

## 2016-06-22 DIAGNOSIS — F99 Mental disorder, not otherwise specified: Secondary | ICD-10-CM | POA: Diagnosis not present

## 2016-06-22 DIAGNOSIS — F4322 Adjustment disorder with anxiety: Secondary | ICD-10-CM

## 2016-06-22 DIAGNOSIS — O9989 Other specified diseases and conditions complicating pregnancy, childbirth and the puerperium: Secondary | ICD-10-CM | POA: Diagnosis not present

## 2016-06-22 DIAGNOSIS — R112 Nausea with vomiting, unspecified: Secondary | ICD-10-CM | POA: Diagnosis not present

## 2016-06-22 DIAGNOSIS — K529 Noninfective gastroenteritis and colitis, unspecified: Secondary | ICD-10-CM

## 2016-06-22 DIAGNOSIS — O99283 Endocrine, nutritional and metabolic diseases complicating pregnancy, third trimester: Secondary | ICD-10-CM | POA: Insufficient documentation

## 2016-06-22 DIAGNOSIS — O99343 Other mental disorders complicating pregnancy, third trimester: Secondary | ICD-10-CM | POA: Insufficient documentation

## 2016-06-22 DIAGNOSIS — F603 Borderline personality disorder: Secondary | ICD-10-CM | POA: Diagnosis not present

## 2016-06-22 DIAGNOSIS — J45909 Unspecified asthma, uncomplicated: Secondary | ICD-10-CM | POA: Diagnosis not present

## 2016-06-22 DIAGNOSIS — R109 Unspecified abdominal pain: Secondary | ICD-10-CM | POA: Diagnosis not present

## 2016-06-22 DIAGNOSIS — R1010 Upper abdominal pain, unspecified: Secondary | ICD-10-CM | POA: Insufficient documentation

## 2016-06-22 DIAGNOSIS — Z8719 Personal history of other diseases of the digestive system: Secondary | ICD-10-CM | POA: Diagnosis not present

## 2016-06-22 DIAGNOSIS — Z113 Encounter for screening for infections with a predominantly sexual mode of transmission: Secondary | ICD-10-CM

## 2016-06-22 DIAGNOSIS — E039 Hypothyroidism, unspecified: Secondary | ICD-10-CM | POA: Insufficient documentation

## 2016-06-22 DIAGNOSIS — Z3A36 36 weeks gestation of pregnancy: Secondary | ICD-10-CM | POA: Diagnosis not present

## 2016-06-22 DIAGNOSIS — O24913 Unspecified diabetes mellitus in pregnancy, third trimester: Secondary | ICD-10-CM | POA: Insufficient documentation

## 2016-06-22 DIAGNOSIS — Z7984 Long term (current) use of oral hypoglycemic drugs: Secondary | ICD-10-CM | POA: Diagnosis not present

## 2016-06-22 DIAGNOSIS — Z3A00 Weeks of gestation of pregnancy not specified: Secondary | ICD-10-CM | POA: Diagnosis not present

## 2016-06-22 DIAGNOSIS — O26893 Other specified pregnancy related conditions, third trimester: Secondary | ICD-10-CM | POA: Diagnosis not present

## 2016-06-22 DIAGNOSIS — O99513 Diseases of the respiratory system complicating pregnancy, third trimester: Secondary | ICD-10-CM | POA: Insufficient documentation

## 2016-06-22 LAB — POCT URINALYSIS DIP (DEVICE)
Bilirubin Urine: NEGATIVE
Glucose, UA: NEGATIVE mg/dL
Hgb urine dipstick: NEGATIVE
Ketones, ur: NEGATIVE mg/dL
Leukocytes, UA: NEGATIVE
Nitrite: NEGATIVE
Protein, ur: NEGATIVE mg/dL
Specific Gravity, Urine: 1.025 (ref 1.005–1.030)
Urobilinogen, UA: 0.2 mg/dL (ref 0.0–1.0)
pH: 5.5 (ref 5.0–8.0)

## 2016-06-22 LAB — OB RESULTS CONSOLE GBS: GBS: NEGATIVE

## 2016-06-22 LAB — OB RESULTS CONSOLE GC/CHLAMYDIA: Gonorrhea: NEGATIVE

## 2016-06-22 MED ORDER — GI COCKTAIL ~~LOC~~
30.0000 mL | Freq: Once | ORAL | Status: AC
  Administered 2016-06-22: 30 mL via ORAL
  Filled 2016-06-22: qty 30

## 2016-06-22 MED ORDER — GLYBURIDE 2.5 MG PO TABS: 2.5000 mg | ORAL_TABLET | Freq: Two times a day (BID) | ORAL | 1 refills | Status: DC

## 2016-06-22 MED ORDER — PROMETHAZINE HCL 25 MG RE SUPP: 25.0000 mg | Freq: Four times a day (QID) | RECTAL | 0 refills | Status: DC | PRN

## 2016-06-22 MED ORDER — PROMETHAZINE HCL 25 MG RE SUPP
25.0000 mg | Freq: Four times a day (QID) | RECTAL | Status: DC | PRN
  Administered 2016-06-22: 25 mg via RECTAL
  Filled 2016-06-22: qty 1

## 2016-06-22 NOTE — Patient Instructions (Signed)
Diabetes Mellitus and Food It is important for you to manage your blood sugar (glucose) level. Your blood glucose level can be greatly affected by what you eat. Eating healthier foods in the appropriate amounts throughout the day at about the same time each day will help you control your blood glucose level. It can also help slow or prevent worsening of your diabetes mellitus. Healthy eating may even help you improve the level of your blood pressure and reach or maintain a healthy weight. General recommendations for healthful eating and cooking habits include:  Eating meals and snacks regularly. Avoid going long periods of time without eating to lose weight.  Eating a diet that consists mainly of plant-based foods, such as fruits, vegetables, nuts, legumes, and whole grains.  Using low-heat cooking methods, such as baking, instead of high-heat cooking methods, such as deep frying.  Work with your dietitian to make sure you understand how to use the Nutrition Facts information on food labels. How can food affect me? Carbohydrates Carbohydrates affect your blood glucose level more than any other type of food. Your dietitian will help you determine how many carbohydrates to eat at each meal and teach you how to count carbohydrates. Counting carbohydrates is important to keep your blood glucose at a healthy level, especially if you are using insulin or taking certain medicines for diabetes mellitus. Alcohol Alcohol can cause sudden decreases in blood glucose (hypoglycemia), especially if you use insulin or take certain medicines for diabetes mellitus. Hypoglycemia can be a life-threatening condition. Symptoms of hypoglycemia (sleepiness, dizziness, and disorientation) are similar to symptoms of having too much alcohol. If your health care provider has given you approval to drink alcohol, do so in moderation and use the following guidelines:  Women should not have more than one drink per day, and men  should not have more than two drinks per day. One drink is equal to: ? 12 oz of beer. ? 5 oz of wine. ? 1 oz of hard liquor.  Do not drink on an empty stomach.  Keep yourself hydrated. Have water, diet soda, or unsweetened iced tea.  Regular soda, juice, and other mixers might contain a lot of carbohydrates and should be counted.  What foods are not recommended? As you make food choices, it is important to remember that all foods are not the same. Some foods have fewer nutrients per serving than other foods, even though they might have the same number of calories or carbohydrates. It is difficult to get your body what it needs when you eat foods with fewer nutrients. Examples of foods that you should avoid that are high in calories and carbohydrates but low in nutrients include:  Trans fats (most processed foods list trans fats on the Nutrition Facts label).  Regular soda.  Juice.  Candy.  Sweets, such as cake, pie, doughnuts, and cookies.  Fried foods.  What foods can I eat? Eat nutrient-rich foods, which will nourish your body and keep you healthy. The food you should eat also will depend on several factors, including:  The calories you need.  The medicines you take.  Your weight.  Your blood glucose level.  Your blood pressure level.  Your cholesterol level.  You should eat a variety of foods, including:  Protein. ? Lean cuts of meat. ? Proteins low in saturated fats, such as fish, egg whites, and beans. Avoid processed meats.  Fruits and vegetables. ? Fruits and vegetables that may help control blood glucose levels, such as apples,   mangoes, and yams.  Dairy products. ? Choose fat-free or low-fat dairy products, such as milk, yogurt, and cheese.  Grains, bread, pasta, and rice. ? Choose whole grain products, such as multigrain bread, whole oats, and brown rice. These foods may help control blood pressure.  Fats. ? Foods containing healthful fats, such as  nuts, avocado, olive oil, canola oil, and fish.  Does everyone with diabetes mellitus have the same meal plan? Because every person with diabetes mellitus is different, there is not one meal plan that works for everyone. It is very important that you meet with a dietitian who will help you create a meal plan that is just right for you. This information is not intended to replace advice given to you by your health care provider. Make sure you discuss any questions you have with your health care provider. Document Released: 11/23/2004 Document Revised: 08/04/2015 Document Reviewed: 01/23/2013 Elsevier Interactive Patient Education  2017 Elsevier Inc.  

## 2016-06-22 NOTE — Progress Notes (Signed)
States missed her fetal echo appointment due to transportation issues , called to reschedule and they said doctor out until later in May.

## 2016-06-22 NOTE — Progress Notes (Signed)
5 lb weight gain. States always has headaches and has not changed in the last week as far as severity .Denies visual changes.

## 2016-06-22 NOTE — Progress Notes (Signed)
Addendum:  After afi completed by Dr. Alysia Penna, patient continues to c/o pelvic pain, sometimes epigastric pain, nausea " I just don't feel good", crying.  States has not eaten much today- checked cbg= 94, c/o feeling hot, T 98.4. Given snack to eat, ginger ale, support given- continues to c/o not feeling well - does not want to be sent home- offered to be taken to MAU for evaluation and she decided she wanted to go to MAU. Report called to MAU charge nurse Jolynn and patient taken to MAU about 12:10 to room 2 , placed on fetal monitor and report given to APP.  Pt did vomit x 1 greenish emesis and states she feels better after that.

## 2016-06-22 NOTE — MAU Note (Signed)
Patient brought from clinic c/o abdominal pain, denies headache. No previous high BP

## 2016-06-22 NOTE — BH Specialist Note (Signed)
Integrated Behavioral Health Follow Up Visit  MRN: [id number] Name: Kristin Hart   Session Start time: 10:50 Session End time: 11:00 Total time: 10 minutes Number of Integrated Behavioral Health Clinician visits: 3/10  Type of Service: Integrated Behavioral Health- Individual/Family Interpretor:No. Interpretor Name and Language: n/a   Warm Hand Off Completed.       SUBJECTIVE: Kristin Hart is a 36 y.o. female accompanied by FOB. Patient was referred by Dr Adrian Blackwater for history of depression, anxiety. Patient reports the following symptoms/concerns: Pt states that she has been practicing CALM relaxation breathing daily, and feels it is helping to cope with anxiety. Duration of problem: Less than one month Severity of problem: mild  OBJECTIVE: Mood: Appropriate and Affect: Appropriate Risk of harm to self or others: No plan to harm self or others   LIFE CONTEXT: Family and Social: Pt has good support system School/Work: May file for disability Self-Care: relaxation breathing, social support Life Changes: Current pregnancy  GOALS ADDRESSED: Patient will reduce symptoms of: anxiety and increase knowledge and/or ability of: self-management skills and also: Increase healthy adjustment to current life circumstances  INTERVENTIONS: Solution-Focused Strategies Standardized Assessments completed: GAD-7 and PHQ 9  ASSESSMENT: Patient currently experiencing Adjustment disorder with anxious mood. Patient may benefit from continued brief therapeutic interventions regarding coping with anxiety.  PLAN: 1. Follow up with behavioral health clinician on : As needed 2. Behavioral recommendations:  -Continue practicing CALM relaxation breathing exercise daily, along with daily walks with FOB, as needed -Continue inquiring at food banks about fresh produce availability -Consider establishing care with [facility] [place] prior to giving birth -Consider going to [facility] within  one week, to set up appointment to talk to disability lawyer -Consider setting up Medicaid transportation for upcoming medical appointments -Consider obtaining reduced-fare bus ID at [employer] -Consider [employer] for additional community services 3. Referral(s): Integrated Behavioral Health Services (In Clinic) 4. "From scale of 1-10, how likely are you to follow plan?": 7  Rae Lips, LCSWA  Depression screen [facility] 2/9 06/22/2016 06/11/2016 06/07/2016 06/04/2016  Decreased Interest 0 0 0 0  Down, Depressed, Hopeless 0 0 0 0  PHQ - 2 Score 0 0 0 0  Altered sleeping 0 0 0 0  Tired, decreased energy 0 0 0 0  Change in appetite 0 0 0 0  Feeling bad or failure about yourself  0 0 0 0  Trouble concentrating 0 0 0 0  Moving slowly or fidgety/restless 0 0 0 0  Suicidal thoughts 0 0 0 0  PHQ-9 Score 0 0 0 0   GAD 7 : Generalized Anxiety Score 06/22/2016 06/11/2016 06/07/2016 06/04/2016  Nervous, Anxious, on Edge Control/stop worrying 1 1 0 0  Worry too much - different things 0 0 0 0  Trouble relaxing 1 0 0 1  Restless 1 0 0 0  Easily annoyed or irritable 1 0 0 0  Afraid - awful might happen 0 0 0 0  Total GAD 7 Score 3

## 2016-06-22 NOTE — Progress Notes (Signed)
Subjective:  Kristin Hart is a 36 y.o. G1P0 at [redacted]w[redacted]d being seen today for ongoing prenatal care.  She is currently monitored for the following issues for this high-risk pregnancy and has Supervision of normal first pregnancy; Diabetes mellitus affecting pregnancy; Hypothyroidism affecting pregnancy; Asthma affecting pregnancy, antepartum; Mental health disorder; History of methamphetamine use; Low vitamin D level; Depression affecting pregnancy in third trimester, antepartum; and Marijuana use on her problem list.  Patient reports no complaints.  Contractions: Not present. Vag. Bleeding: None.  Movement: Present. Denies leaking of fluid.   The following portions of the patient's history were reviewed and updated as appropriate: allergies, current medications, past family history, past medical history, past social history, past surgical history and problem list. Problem list updated.  Objective:   Vitals:   06/22/16 1000  BP: 129/77  Pulse: 86  Weight: 215 lb 12.8 oz (97.9 kg)    Fetal Status: Fetal Heart Rate (bpm): NST   Movement: Present     General:  Alert, oriented and cooperative. Patient is in no acute distress.  Skin: Skin is warm and dry. No rash noted.   Cardiovascular: Normal heart rate noted  Respiratory: Normal respiratory effort, no problems with respiration noted  Abdomen: Soft, gravid, appropriate for gestational age. Pain/Pressure: Present     Pelvic:  Cervical exam performed        Extremities: Normal range of motion.  Edema: Moderate pitting, indentation subsides rapidly  Mental Status: Normal mood and affect. Normal behavior. Normal judgment and thought content.   Urinalysis: Urine Protein: Negative Urine Glucose: Negative  Assessment and Plan:  Pregnancy: G1P0 at [redacted]w[redacted]d  1. Encounter for supervision of normal first pregnancy in third trimester  - Culture, beta strep (group b only) - GC/Chlamydia probe amp ([name])not at [facility] - Amniotic fluid index with  NST; Future  2. Diabetes mellitus affecting pregnancy in third trimester Still uncontrolled Will add Glyburide 2.5 mg bid NSt reactive today AFI 12.3 Will schedule pt for twice weekly testing to 07/19/16 Was unable to obtain Fetal ECHO, docotor out of office now until after delivery, will see if able to arrange alternative - Culture, beta strep (group b only) - GC/Chlamydia probe amp ([name])not at [facility] - Amniotic fluid index with NST; Future - glyBURIDE (DIABETA) 2.5 MG tablet; Take 1 tablet (2.5 mg total) by mouth 2 (two) times daily with a meal.  Dispense: 60 tablet; Refill: 1  3. Hypothyroidism affecting pregnancy in third trimester TSH normal No meds - Culture, beta strep (group b only) - GC/Chlamydia probe amp ([name])not at [name] - Amniotic fluid index with NST; Future  Term labor symptoms and general obstetric precautions including but not limited to vaginal bleeding, contractions, leaking of fluid and fetal movement were reviewed in detail with the patient. Please refer to After Visit Summary for other counseling recommendations.  Return in about 1 week (around 06/29/2016) for OB visit.   Hermina Staggers, MD

## 2016-06-22 NOTE — MAU Note (Signed)
Pt vomited up GI Cocktail

## 2016-06-22 NOTE — Discharge Instructions (Signed)

## 2016-06-22 NOTE — MAU Provider Note (Signed)
Chief Complaint:  Abdominal Pain   First Provider Initiated Contact with Patient 06/22/16 1256     HPI: Kristin Hart is a 36 y.o. G1P0 at 21w5dwho presents to maternity admissions reporting upper abdominal pain and intermittent sharp pains all over abdomen that come and go, move around/different places.  States has had liquid stools today.  Hx ulcer.  .Has some nausea also.   She reports good fetal movement, denies LOF, vaginal bleeding, vaginal itching/burning, urinary symptoms, h/a, dizziness, n/v, constipation or fever/chills.    Abdominal Pain  This is a new problem. The current episode started today. The onset quality is gradual. The problem occurs intermittently. The problem has been waxing and waning. The pain is located in the generalized abdominal region. The pain is moderate. The quality of the pain is cramping, sharp and colicky. The abdominal pain does not radiate. Associated symptoms include diarrhea. Pertinent negatives include no belching, constipation, dysuria, fever, flatus, frequency, headaches, myalgias, nausea or vomiting. The pain is aggravated by palpation. The pain is relieved by nothing. She has tried nothing for the symptoms.    RN Note: Patient brought from clinic c/o abdominal pain, denies headache. No previous high BP  Past Medical History: Past Medical History:  Diagnosis Date  . Asthma   . Borderline personality disorder   . 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 Diabetes mellitus without complication (HCC)    type 2  . Hypothyroidism   . Psychiatric disorder   . Stomach ulcer     Past obstetric history: OB History  Gravida Para Term Preterm AB Living  1            SAB TAB Ectopic Multiple Live Births               # Outcome Date GA Lbr Len/2nd Weight Sex Delivery Anes PTL Lv  1 Gravida               Past Surgical History: History reviewed. No pertinent surgical history.  Family History: History  reviewed. No pertinent family history.  Social History: Social History  Substance Use Topics  . Smoking status: Never Smoker  . Smokeless tobacco: Never Used  . Alcohol use No    Allergies: No Known Allergies  Meds:  Prescriptions Prior to Admission  Medication Sig Dispense Refill Last Dose  . albuterol (PROVENTIL HFA;VENTOLIN HFA) 108 (90 Base) MCG/ACT inhaler Inhale 2 puffs into the lungs every 6 (six) hours as needed for wheezing or shortness of breath. 1 Inhaler 2 Taking  . glyBURIDE (DIABETA) 2.5 MG tablet Take 1 tablet (2.5 mg total) by mouth 2 (two) times daily with a meal. 60 tablet 1   . metFORMIN (GLUCOPHAGE) 1000 MG tablet Take 1 tablet (1,000 mg total) by mouth 2 (two) times daily with a meal. 60 tablet 3 Taking  . Prenat-FeCbn-FeAspGl-FA-Omega (OB COMPLETE PETITE) 35-5-1-200 MG CAPS Take 1 tablet by mouth daily. 30 capsule 12 Taking  . Vitamin D, Ergocalciferol, (DRISDOL) 50000 units CAPS capsule Take 1 capsule (50,000 Units total) by mouth every 7 (seven) days. 30 capsule 2 Taking    I have reviewed patient's Past Medical Hx, Surgical Hx, Family Hx, Social Hx, medications and allergies.   ROS:  Review of Systems  Constitutional: Negative for fever.  Gastrointestinal: Positive for abdominal pain and diarrhea. Negative for constipation, flatus, nausea and vomiting.  Genitourinary: Negative for dysuria and frequency.  Musculoskeletal: Negative for myalgias.  Neurological: Negative for headaches.  Other systems negative  Physical Exam  Patient Vitals for the past 24 hrs:  BP Temp Temp src Pulse Resp  06/22/16 1306 (!) 143/81 - - 83 18  06/22/16 1246 (!) 141/87 - - 90 -  06/22/16 1241 (!) 143/92 98.2 F (36.8 C) Oral (!) 103 16   Constitutional: Well-developed, well-nourished female in no acute distress.  Cardiovascular: normal rate and rhythm Respiratory: normal effort, clear to auscultation bilaterally GI: Abd soft, diffusely tender throughout, gravid  appropriate for gestational age.   Mild rebound but no guarding. MS: Extremities nontender, no edema, normal ROM Neurologic: Alert and oriented x 4.  GU: Neg CVAT.  PELVIC EXAM:  deferred, no contractions  FHT:  Baseline 140 , moderate variability, accelerations present, no decelerations Contractions:  Irregular irritability   Labs: Results for orders placed or performed in visit on 06/22/16 (from the past 24 hour(s))  POCT urinalysis dip (device)     Status: None   Collection Time: 06/22/16  9:53 AM  Result Value Ref Range   Glucose, UA NEGATIVE NEGATIVE mg/dL   Bilirubin Urine NEGATIVE NEGATIVE   Ketones, ur NEGATIVE NEGATIVE mg/dL   Specific Gravity, Urine 1.025 1.005 - 1.030   Hgb urine dipstick NEGATIVE NEGATIVE   pH 5.5 5.0 - 8.0   Protein, ur NEGATIVE NEGATIVE mg/dL   Urobilinogen, UA 0.2 0.0 - 1.0 mg/dL   Nitrite NEGATIVE NEGATIVE   Leukocytes, UA NEGATIVE NEGATIVE   A/Positive/-- (12/13 1519)  Imaging:    MAU Course/MDM: I have ordered labs and reviewed results.  NST reviewed GI cocktail given which she vomited.  Phenergan suppository given    Felt better after Phenergan Treatments in MAU included GI cocktail, phenergan.    Assessment: SIUP at [redacted]w[redacted]d Abdominal cramping/colic Probable gastroenteritis given new onset vomiting and diarrhea today  Plan: Discharge home Supportive care Advance diet as tolerated/BRAT Rx Phenergan suppositories Labor precautions and fetal kick counts Follow up in Office for prenatal visits and recheck of status  Encouraged to return here or to other Urgent Care/ED if she develops worsening of symptoms, increase in pain, fever, or other concerning symptoms.   Pt stable at time of discharge.  Wynelle Bourgeois CNM, MSN Certified Nurse-Midwife 06/22/2016 1:13 PM

## 2016-06-25 ENCOUNTER — Encounter: Payer: Self-pay | Admitting: *Deleted

## 2016-06-25 LAB — GC/CHLAMYDIA PROBE AMP (~~LOC~~) NOT AT ARMC
Chlamydia: NEGATIVE
Neisseria Gonorrhea: NEGATIVE

## 2016-06-26 ENCOUNTER — Ambulatory Visit (INDEPENDENT_AMBULATORY_CARE_PROVIDER_SITE_OTHER): Payer: Medicaid Other | Admitting: *Deleted

## 2016-06-26 VITALS — BP 126/72 | HR 82

## 2016-06-26 DIAGNOSIS — O24913 Unspecified diabetes mellitus in pregnancy, third trimester: Secondary | ICD-10-CM

## 2016-06-26 LAB — CULTURE, BETA STREP (GROUP B ONLY): Strep Gp B Culture: NEGATIVE

## 2016-06-26 NOTE — Progress Notes (Signed)
NST reactive.

## 2016-06-26 NOTE — Addendum Note (Signed)
Addended by: [name] on: 06/26/2016 04:34 PM   Modules accepted: Level of Service

## 2016-06-26 NOTE — Progress Notes (Signed)
Pt informed of appt on 4/24 @ 1300 for Fetal Echo @ [facility]  [phone]

## 2016-06-29 ENCOUNTER — Ambulatory Visit (INDEPENDENT_AMBULATORY_CARE_PROVIDER_SITE_OTHER): Payer: Medicaid Other | Admitting: Obstetrics and Gynecology

## 2016-06-29 ENCOUNTER — Ambulatory Visit: Payer: Self-pay

## 2016-06-29 VITALS — BP 121/78 | HR 91 | Wt 224.3 lb

## 2016-06-29 DIAGNOSIS — Z3403 Encounter for supervision of normal first pregnancy, third trimester: Secondary | ICD-10-CM

## 2016-06-29 DIAGNOSIS — F329 Major depressive disorder, single episode, unspecified: Secondary | ICD-10-CM

## 2016-06-29 DIAGNOSIS — O24913 Unspecified diabetes mellitus in pregnancy, third trimester: Secondary | ICD-10-CM

## 2016-06-29 DIAGNOSIS — O99343 Other mental disorders complicating pregnancy, third trimester: Secondary | ICD-10-CM

## 2016-06-29 LAB — POCT URINALYSIS DIP (DEVICE)
Bilirubin Urine: NEGATIVE
Glucose, UA: 500 mg/dL — AB
Hgb urine dipstick: NEGATIVE
Ketones, ur: NEGATIVE mg/dL
Leukocytes, UA: NEGATIVE
Nitrite: NEGATIVE
Protein, ur: NEGATIVE mg/dL
Specific Gravity, Urine: >=1.03 (ref 1.005–1.030)
Urobilinogen, UA: 0.2 mg/dL (ref 0.0–1.0)
pH: 6 (ref 5.0–8.0)

## 2016-06-29 MED ORDER — CETIRIZINE HCL 10 MG PO TABS: 10.0000 mg | ORAL_TABLET | Freq: Every day | ORAL | 2 refills | Status: DC

## 2016-06-29 NOTE — Progress Notes (Signed)
   PRENATAL VISIT NOTE  Subjective:  Kristin Hart is a 36 y.o. G1P0 at [redacted]w[redacted]d being seen today for ongoing prenatal care.  She is currently monitored for the following issues for this high-risk pregnancy and has Supervision of normal first pregnancy; Diabetes mellitus affecting pregnancy; Hypothyroidism affecting pregnancy; Asthma affecting pregnancy, antepartum; Mental health disorder; History of methamphetamine use; Low vitamin D level; Depression affecting pregnancy in third trimester, antepartum; and Marijuana use on her problem list.  Patient reports no complaints.  Contractions: Not present. Vag. Bleeding: None.  Movement: Present. Denies leaking of fluid.   The following portions of the patient's history were reviewed and updated as appropriate: allergies, current medications, past family history, past medical history, past social history, past surgical history and problem list. Problem list updated.  Objective:   Vitals:   06/29/16 0956  BP: 121/78  Pulse: 91  Weight: 224 lb 4.8 oz (101.7 kg)    Fetal Status: Fetal Heart Rate (bpm): NST   Movement: Present     General:  Alert, oriented and cooperative. Patient is in no acute distress.  Skin: Skin is warm and dry. No rash noted.   Cardiovascular: Normal heart rate noted  Respiratory: Normal respiratory effort, no problems with respiration noted  Abdomen: Soft, gravid, appropriate for gestational age. Pain/Pressure: Present     Pelvic:  Cervical exam deferred        Extremities: Normal range of motion.  Edema: Moderate pitting, indentation subsides rapidly  Mental Status: Normal mood and affect. Normal behavior. Normal judgment and thought content.   Assessment and Plan:  Pregnancy: G1P0 at [redacted]w[redacted]d  1. Diabetes mellitus affecting pregnancy in third trimester CBGs reviewed and the great majority within range- no change made to current glyburide regimen. Patient admits that it is hard at times to follow diet as her food sources  are restricted to what is available at the food bank Follow up growth ultrasound as scheduled  Fetal echo scheduled on 4/24 NST reviewed and reactive - Fetal nonstress test - US OB Limited - US MFM OB FOLLOW UP; Future  2. Depression affecting pregnancy in third trimester, antepartum   3. Encounter for supervision of normal first pregnancy in third trimester Patient is doing well Answered questions regarding IOL at 39 weeks - [place] MFM OB FOLLOW UP; Future  Term labor symptoms and general obstetric precautions including but not limited to vaginal bleeding, contractions, leaking of fluid and fetal movement were reviewed in detail with the patient. Please refer to After Visit Summary for other counseling recommendations.  Return in about 6 days (around 07/05/2016) for Ob fu and NST ([place] @ 1530).   Catalina Antigua, MD

## 2016-06-29 NOTE — Progress Notes (Addendum)
Pt informed that the ultrasound is considered a limited OB ultrasound and is not intended to be a complete ultrasound exam.  Patient also informed that the ultrasound is not being completed with the intent of assessing for fetal or placental anomalies or any pelvic abnormalities.  Explained that the purpose of today's ultrasound is to assess for presentation and amniotic fluid volume.  Patient acknowledges the purpose of the exam and the limitations of the study.    *9 lb weight gain in 1 week. Pt reports increased pelvic pain and pressure x2 days. Pt also reports sx of allergies - cough, runny nose, sneezing.  She requests Rx for Zyrtec - given. IOL scheduled 4/29 @ midnight.

## 2016-06-30 ENCOUNTER — Emergency Department (HOSPITAL_COMMUNITY)
Admission: EM | Admit: 2016-06-30 | Discharge: 2016-06-30 | Discharge status: Against Medical Advice | Payer: Medicaid Other

## 2016-06-30 NOTE — ED Notes (Signed)
Patient did not answer when called on two different occasions.

## 2016-07-02 ENCOUNTER — Telehealth (HOSPITAL_COMMUNITY): Payer: Self-pay | Admitting: *Deleted

## 2016-07-02 NOTE — Telephone Encounter (Signed)
Preadmission screen  

## 2016-07-03 ENCOUNTER — Encounter (HOSPITAL_COMMUNITY): Payer: Self-pay | Admitting: Student

## 2016-07-03 ENCOUNTER — Ambulatory Visit: Payer: Medicaid Other | Admitting: Family Medicine

## 2016-07-03 ENCOUNTER — Inpatient Hospital Stay (HOSPITAL_COMMUNITY)
Admission: AD | Admit: 2016-07-03 | Discharge: 2016-07-07 | DRG: 765 | Disposition: A | Discharge status: Home / Self Care | Payer: Medicaid Other | Source: Ambulatory Visit | Attending: Family Medicine | Admitting: Family Medicine

## 2016-07-03 ENCOUNTER — Ambulatory Visit (HOSPITAL_COMMUNITY)
Admission: RE | Admit: 2016-07-03 | Discharge: 2016-07-03 | Disposition: A | Discharge status: Home / Self Care | Payer: Medicaid Other | Source: Ambulatory Visit | Attending: Family Medicine | Admitting: Family Medicine

## 2016-07-03 VITALS — BP 125/70 | HR 82

## 2016-07-03 DIAGNOSIS — E039 Hypothyroidism, unspecified: Secondary | ICD-10-CM | POA: Diagnosis present

## 2016-07-03 DIAGNOSIS — O99280 Endocrine, nutritional and metabolic diseases complicating pregnancy, unspecified trimester: Secondary | ICD-10-CM

## 2016-07-03 DIAGNOSIS — O99324 Drug use complicating childbirth: Secondary | ICD-10-CM | POA: Diagnosis present

## 2016-07-03 DIAGNOSIS — O99323 Drug use complicating pregnancy, third trimester: Secondary | ICD-10-CM

## 2016-07-03 DIAGNOSIS — R05 Cough: Secondary | ICD-10-CM

## 2016-07-03 DIAGNOSIS — O9952 Diseases of the respiratory system complicating childbirth: Secondary | ICD-10-CM | POA: Diagnosis present

## 2016-07-03 DIAGNOSIS — O09893 Supervision of other high risk pregnancies, third trimester: Secondary | ICD-10-CM

## 2016-07-03 DIAGNOSIS — F1290 Cannabis use, unspecified, uncomplicated: Secondary | ICD-10-CM | POA: Diagnosis present

## 2016-07-03 DIAGNOSIS — F329 Major depressive disorder, single episode, unspecified: Secondary | ICD-10-CM | POA: Diagnosis present

## 2016-07-03 DIAGNOSIS — O0933 Supervision of pregnancy with insufficient antenatal care, third trimester: Secondary | ICD-10-CM

## 2016-07-03 DIAGNOSIS — O24425 Gestational diabetes mellitus in childbirth, controlled by oral hypoglycemic drugs: Secondary | ICD-10-CM | POA: Diagnosis present

## 2016-07-03 DIAGNOSIS — O99344 Other mental disorders complicating childbirth: Secondary | ICD-10-CM | POA: Diagnosis present

## 2016-07-03 DIAGNOSIS — O9081 Anemia of the puerperium: Secondary | ICD-10-CM | POA: Diagnosis not present

## 2016-07-03 DIAGNOSIS — Z3A38 38 weeks gestation of pregnancy: Secondary | ICD-10-CM | POA: Insufficient documentation

## 2016-07-03 DIAGNOSIS — J45909 Unspecified asthma, uncomplicated: Secondary | ICD-10-CM | POA: Diagnosis present

## 2016-07-03 DIAGNOSIS — D649 Anemia, unspecified: Secondary | ICD-10-CM | POA: Diagnosis not present

## 2016-07-03 DIAGNOSIS — F12280 Cannabis dependence with cannabis-induced anxiety disorder: Secondary | ICD-10-CM

## 2016-07-03 DIAGNOSIS — Z7984 Long term (current) use of oral hypoglycemic drugs: Secondary | ICD-10-CM

## 2016-07-03 DIAGNOSIS — O24913 Unspecified diabetes mellitus in pregnancy, third trimester: Secondary | ICD-10-CM

## 2016-07-03 DIAGNOSIS — O99284 Endocrine, nutritional and metabolic diseases complicating childbirth: Secondary | ICD-10-CM | POA: Diagnosis present

## 2016-07-03 DIAGNOSIS — O24415 Gestational diabetes mellitus in pregnancy, controlled by oral hypoglycemic drugs: Secondary | ICD-10-CM

## 2016-07-03 DIAGNOSIS — O99343 Other mental disorders complicating pregnancy, third trimester: Secondary | ICD-10-CM | POA: Insufficient documentation

## 2016-07-03 DIAGNOSIS — R059 Cough, unspecified: Secondary | ICD-10-CM

## 2016-07-03 DIAGNOSIS — O368390 Maternal care for abnormalities of the fetal heart rate or rhythm, unspecified trimester, not applicable or unspecified: Secondary | ICD-10-CM | POA: Diagnosis present

## 2016-07-03 DIAGNOSIS — O288 Other abnormal findings on antenatal screening of mother: Secondary | ICD-10-CM | POA: Insufficient documentation

## 2016-07-03 DIAGNOSIS — Z3403 Encounter for supervision of normal first pregnancy, third trimester: Secondary | ICD-10-CM

## 2016-07-03 LAB — URINALYSIS, ROUTINE W REFLEX MICROSCOPIC
Bilirubin Urine: NEGATIVE
Glucose, UA: NEGATIVE mg/dL
Ketones, ur: NEGATIVE mg/dL
Nitrite: NEGATIVE
Protein, ur: NEGATIVE mg/dL
Specific Gravity, Urine: 1.028 (ref 1.005–1.030)
pH: 5 (ref 5.0–8.0)

## 2016-07-03 LAB — GLUCOSE, CAPILLARY: Glucose-Capillary: 106 mg/dL — ABNORMAL HIGH (ref 65–99)

## 2016-07-03 LAB — CBC
HCT: 38.9 % (ref 36.0–46.0)
Hemoglobin: 13 g/dL (ref 12.0–15.0)
MCH: 30.7 pg (ref 26.0–34.0)
MCHC: 33.4 g/dL (ref 30.0–36.0)
MCV: 91.7 fL (ref 78.0–100.0)
Platelets: 233 K/uL (ref 150–400)
RBC: 4.24 MIL/uL (ref 3.87–5.11)
RDW: 12.8 % (ref 11.5–15.5)
WBC: 14.6 K/uL — ABNORMAL HIGH (ref 4.0–10.5)

## 2016-07-03 LAB — TYPE AND SCREEN
ABO/RH(D): A POS
Antibody Screen: NEGATIVE

## 2016-07-03 IMAGING — US US MFM FETAL BPP W/O NON-STRESS
1 series · 14 of 28 positions shown · non-contrast
Comparison: none

[Series 1: us mfm fetal bpp w/o non-stress · 29 acquisitions, 14 frames shown]
[im 2/29]
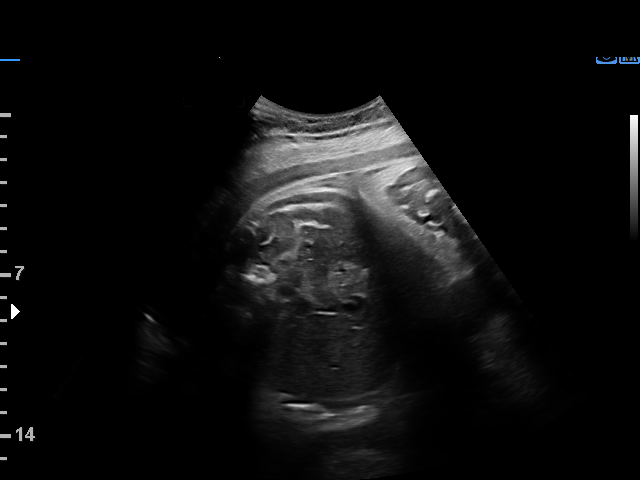
[im 4/29]
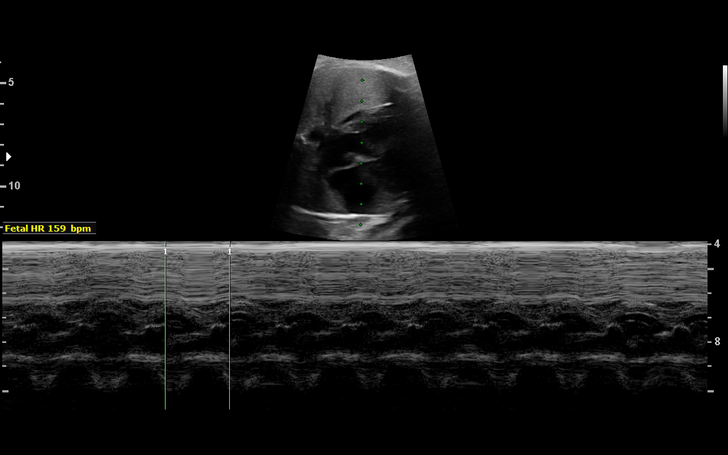
[im 6/29]
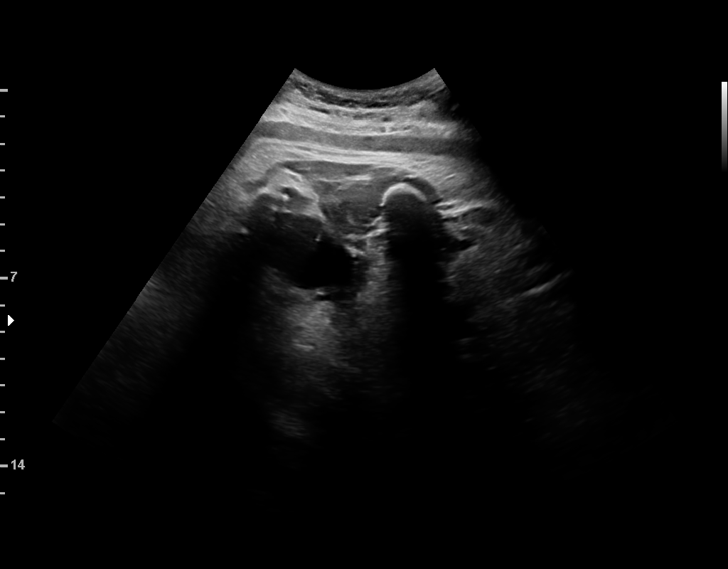
[im 8/29]
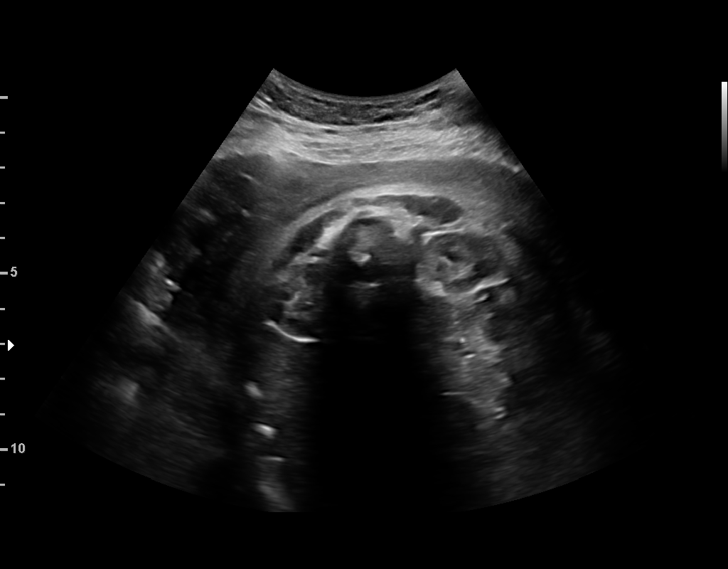
[im 10/29]
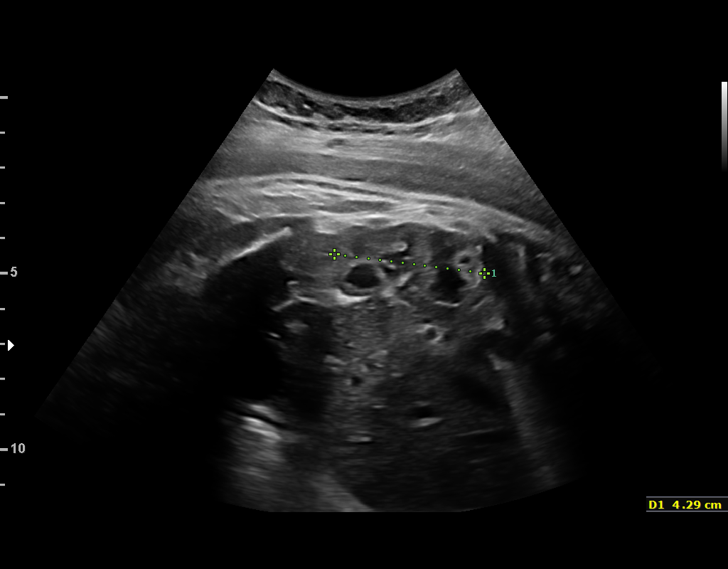
[im 12/29]
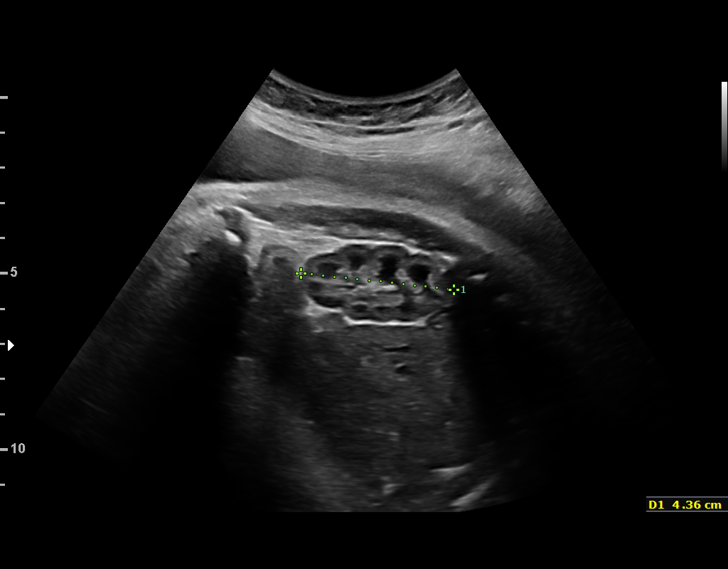
[im 14/29]
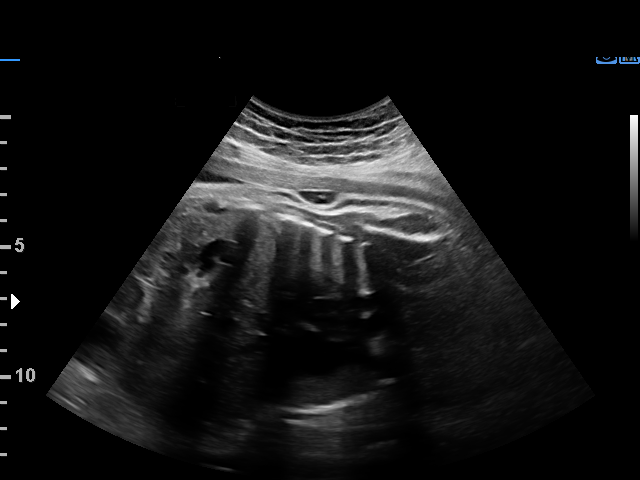
[im 16/29]
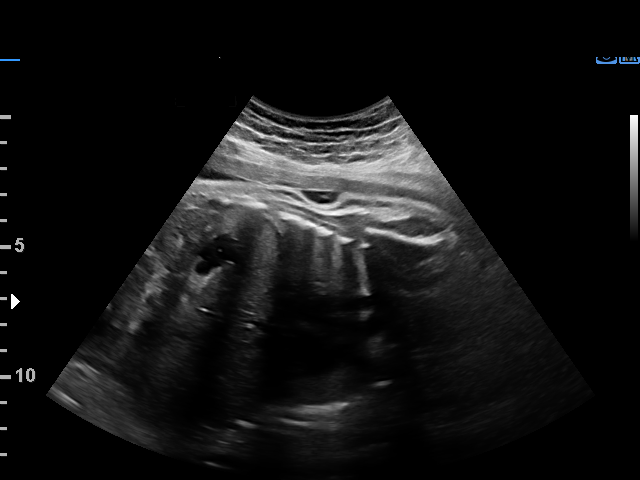
[im 18/29]
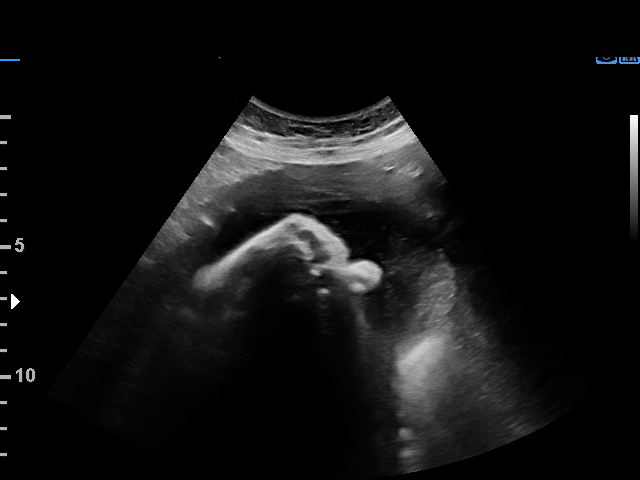
[im 20/29]
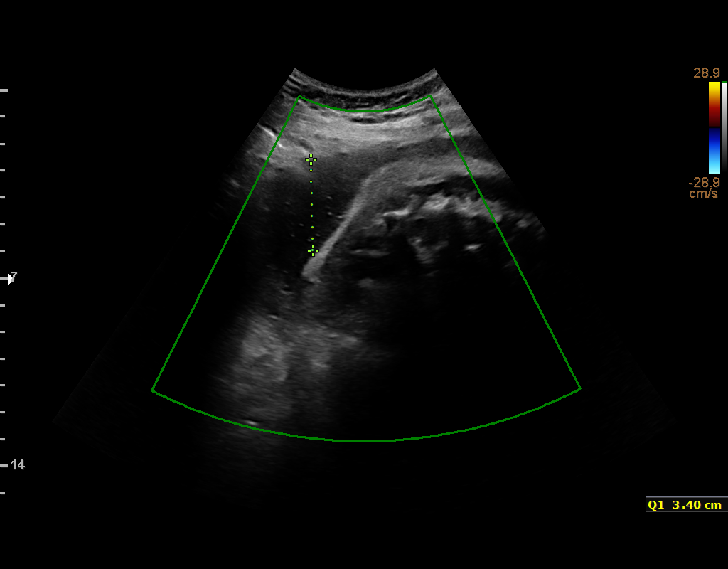
[im 22/29]
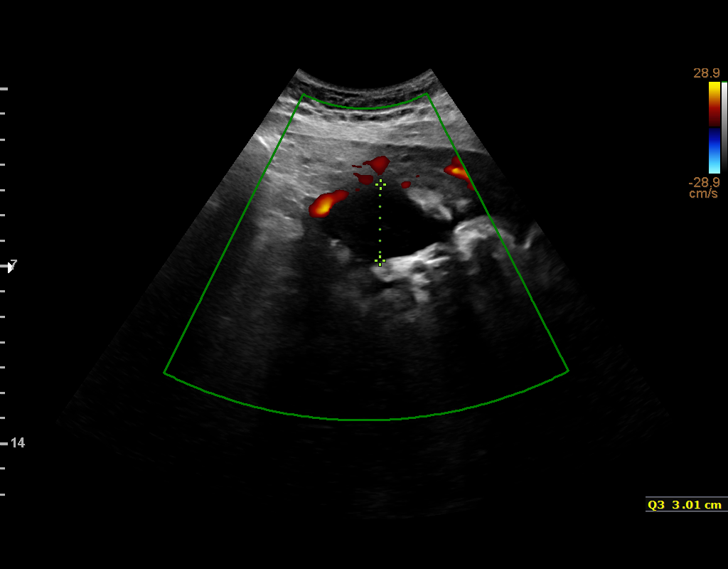
[im 24/29]
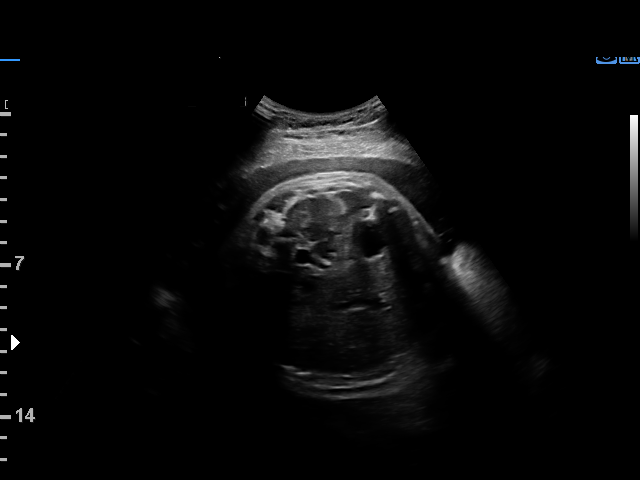
[im 26/29]
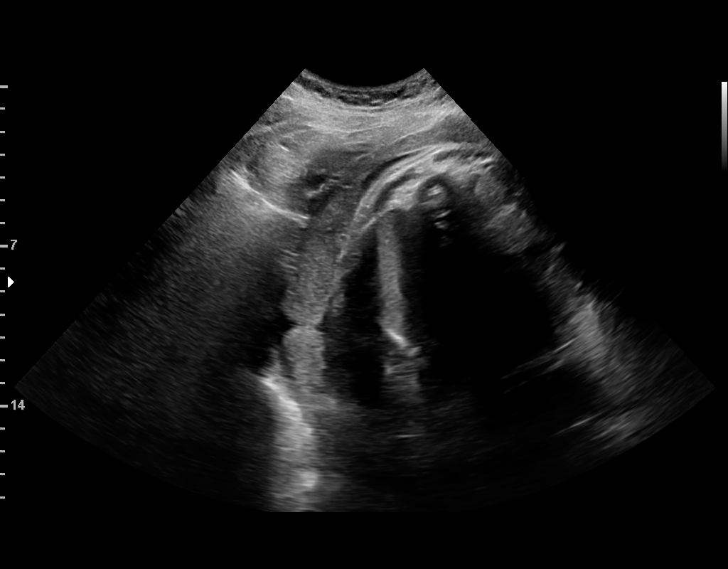
[im 29/29]
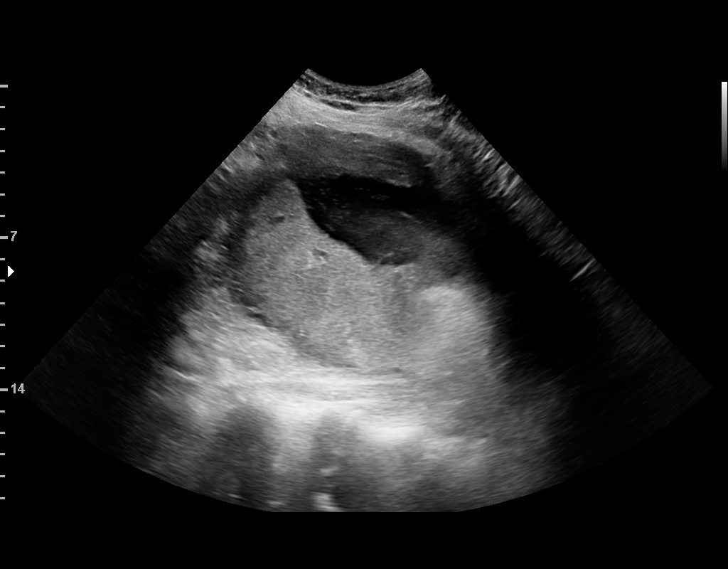

[14 of 28 positions shown; findings below may reference images not displayed]

OB/Gyn Clinic

Indications

38 weeks gestation of pregnancy
Gestational diabetes in pregnancy,             [KS]
controlled by oral hypoglycemic drugs
Late to prenatal care, third trimester         [KS]
Drug use complicating pregnancy, third         [KS]
trimester (marijuana)
Other mental disorder complicating             [KS]
pregnancy, third trimester (depression/
anxiety)
Hypothyroid                                    [KS] [KS]
Non-reactive NST, FHR decelerations            [KS]
Fetal Evaluation

Num Of Fetuses:     1
Fetal Heart         159
Rate(bpm):
Cardiac Activity:   Observed
Presentation:       Cephalic
Placenta:           Posterior, above cervical os
Amniotic Fluid
AFI FV:      Subjectively within normal limits

AFI Sum(cm)     %Tile       Largest Pocket(cm)
14.51           56

RUQ(cm)       RLQ(cm)       LUQ(cm)        LLQ(cm)
3.4
Biophysical Evaluation

Amniotic F.V:   Within normal limits       F. Tone:        Observed
F. Movement:    Observed                   Score:          [DATE]
F. Breathing:   Observed
Gestational Age

LMP:           38w 2d        Date:  [DATE]                 EDD:   [DATE]
Best:          38w 2d     Det. By:  LMP  ([DATE])          EDD:   [DATE]
Anatomy

Stomach:               Appears normal, left   Bladder:                Appears normal
sided
Kidneys:               Appear normal
Cervix Uterus Adnexa

Cervix
Not visualized (advanced GA >[KS])

Uterus
No abnormality visualized.

Left Ovary
Not visualized.

Right Ovary
Not visualized.
Impression

SIUP at [KS] (remote read of ultrasound images only)
active singleton fetus
BPP [DATE]
AFI is normal
Recommendations

Recommend correlation with fetal heart tracing and
management as clinically indicated.

## 2016-07-03 MED ORDER — DIPHENHYDRAMINE HCL 50 MG/ML IJ SOLN: 12.5000 mg | INTRAMUSCULAR | Status: DC | PRN

## 2016-07-03 MED ORDER — DEXTROMETHORPHAN POLISTIREX ER 30 MG/5ML PO SUER: 60.0000 mg | Freq: Two times a day (BID) | ORAL | 1 refills | Status: DC | PRN

## 2016-07-03 MED ORDER — LACTATED RINGERS IV SOLN
INTRAVENOUS | Status: DC
  Administered 2016-07-03: 20:00:00 via INTRAVENOUS

## 2016-07-03 MED ORDER — FENTANYL CITRATE (PF) 100 MCG/2ML IJ SOLN
100.0000 ug | INTRAMUSCULAR | Status: DC | PRN
  Administered 2016-07-03 (×3): 100 ug via INTRAVENOUS
  Filled 2016-07-03 (×3): qty 2

## 2016-07-03 MED ORDER — LACTATED RINGERS IV SOLN
INTRAVENOUS | Status: DC
  Administered 2016-07-03 – 2016-07-04 (×4): via INTRAVENOUS

## 2016-07-03 MED ORDER — PHENYLEPHRINE 40 MCG/ML (10ML) SYRINGE FOR IV PUSH (FOR BLOOD PRESSURE SUPPORT): 80.0000 ug | PREFILLED_SYRINGE | INTRAVENOUS | Status: DC | PRN

## 2016-07-03 MED ORDER — ACETAMINOPHEN 325 MG PO TABS: 650.0000 mg | ORAL_TABLET | ORAL | Status: DC | PRN

## 2016-07-03 MED ORDER — OXYTOCIN 40 UNITS IN LACTATED RINGERS INFUSION - SIMPLE MED: 2.5000 [IU]/h | INTRAVENOUS | Status: DC

## 2016-07-03 MED ORDER — SOD CITRATE-CITRIC ACID 500-334 MG/5ML PO SOLN
30.0000 mL | ORAL | Status: DC | PRN
  Filled 2016-07-03: qty 15

## 2016-07-03 MED ORDER — FENTANYL 2.5 MCG/ML BUPIVACAINE 1/10 % EPIDURAL INFUSION (WH - ANES)
14.0000 mL/h | INTRAMUSCULAR | Status: DC | PRN
  Administered 2016-07-04: 14 mL/h via EPIDURAL
  Filled 2016-07-03: qty 100

## 2016-07-03 MED ORDER — ONDANSETRON HCL 4 MG/2ML IJ SOLN
4.0000 mg | Freq: Four times a day (QID) | INTRAMUSCULAR | Status: DC | PRN
  Administered 2016-07-03 (×2): 4 mg via INTRAVENOUS
  Filled 2016-07-03: qty 2

## 2016-07-03 MED ORDER — PHENYLEPHRINE 40 MCG/ML (10ML) SYRINGE FOR IV PUSH (FOR BLOOD PRESSURE SUPPORT)
80.0000 ug | PREFILLED_SYRINGE | INTRAVENOUS | Status: DC | PRN
  Filled 2016-07-03: qty 10

## 2016-07-03 MED ORDER — LACTATED RINGERS IV SOLN: 500.0000 mL | Freq: Once | INTRAVENOUS | Status: DC

## 2016-07-03 MED ORDER — EPHEDRINE 5 MG/ML INJ: 10.0000 mg | INTRAVENOUS | Status: DC | PRN

## 2016-07-03 MED ORDER — TERBUTALINE SULFATE 1 MG/ML IJ SOLN
0.2500 mg | Freq: Once | INTRAMUSCULAR | Status: AC | PRN
  Administered 2016-07-04: 0.25 mg via SUBCUTANEOUS
  Filled 2016-07-03: qty 1

## 2016-07-03 MED ORDER — LACTATED RINGERS IV SOLN: 500.0000 mL | INTRAVENOUS | Status: DC | PRN

## 2016-07-03 MED ORDER — OXYTOCIN 40 UNITS IN LACTATED RINGERS INFUSION - SIMPLE MED
1.0000 m[IU]/min | INTRAVENOUS | Status: DC
  Administered 2016-07-03: 2 m[IU]/min via INTRAVENOUS
  Filled 2016-07-03: qty 1000

## 2016-07-03 MED ORDER — LIDOCAINE HCL (PF) 1 % IJ SOLN: 30.0000 mL | INTRAMUSCULAR | Status: DC | PRN

## 2016-07-03 MED ORDER — OXYCODONE-ACETAMINOPHEN 5-325 MG PO TABS: 2.0000 | ORAL_TABLET | ORAL | Status: DC | PRN

## 2016-07-03 MED ORDER — OXYCODONE-ACETAMINOPHEN 5-325 MG PO TABS
1.0000 | ORAL_TABLET | ORAL | Status: DC | PRN
Start: 2016-07-03 — End: 2016-07-04

## 2016-07-03 MED ORDER — OXYTOCIN BOLUS FROM INFUSION: 500.0000 mL | Freq: Once | INTRAVENOUS | Status: DC

## 2016-07-03 NOTE — Progress Notes (Signed)
LABOR PROGRESS NOTE  Kristin Hart is a 36 y.o. G1P0 at [redacted]w[redacted]d  admitted for IOL in the settings of NRFHR.  Subjective: Patient is complaining of pain with contraction but currently tolerable. Patient plan to get epidural but want to wait until pain is less manageable with IV pain meds.  Objective: BP (!) 143/85   Pulse 68   Temp 98 F (36.7 C) (Oral)   Resp 16   Ht 5' 8.5" (1.74 m)   Wt 224 lb (101.6 kg)   LMP 10/09/2015 (Approximate)   BMI 33.56 kg/m  or  Vitals:   07/03/16 1743 07/03/16 1941 07/03/16 2030 07/03/16 2046  BP: 136/70  (!) 150/88 (!) 143/85  Pulse: 85  67 68  Resp: Temp: 98.4 F (36.9 C)  98 F (36.7 C)   TempSrc: Oral  Oral   Weight:  224 lb (101.6 kg)    Height:  5' 8.5" (1.74 m)      Dilation: 1.5 Effacement (%): 70 Cervical Position: Middle Station: -1 Presentation: Vertex Exam by::  Omer Jack, MD)  Labs: Lab Results  Component Value Date   WBC 14.6 (H) 07/03/2016   HGB 13.0 07/03/2016   HCT 38.9 07/03/2016   MCV 91.7 07/03/2016   PLT 233 07/03/2016    Patient Active Problem List   Diagnosis Date Noted  . Non-reassuring fetal heart rate or rhythm affecting management of mother 07/03/2016  . Depression affecting pregnancy in third trimester, antepartum 06/07/2016  . Marijuana use 06/07/2016  . Low vitamin D level 03/15/2016  . Supervision of normal first pregnancy 02/22/2016  . Diabetes mellitus affecting pregnancy 02/22/2016  . Hypothyroidism affecting pregnancy 02/22/2016  . Asthma affecting pregnancy, antepartum 02/22/2016  . Mental health disorder 02/22/2016  . History of methamphetamine use 02/22/2016    Assessment / Plan: 36 y.o. G1P0 at [redacted]w[redacted]d here for IOL in the settings of NRFHR.   Labor: Continue expectant management Fetal Wellbeing: Cat 1  Pain Control:  IV pain meds Anticipated MOD:  Vaginal  Lovena Neighbours, MD 07/03/2016, 10:19 PM

## 2016-07-03 NOTE — MAU Provider Note (Signed)
History     CSN: [id number]  Arrival date and time: 07/03/16 1717  First Provider Initiated Contact with Patient 07/03/16 1802      Chief Complaint  Patient presents with  . Non-stress Test   HPI Kristin Hart is a 36 y.o. G1P0 at [redacted]w[redacted]d who presents for fetal monitoring. Being followed for GDM. Had NST this morning in clinic & found to have fetal decels; sent to MFM for BPP. BPP 8/8 with normal AFI; sent to MAU for extended monitoring.  Patient denies abdominal pain, vaginal bleeding, or LOF. Positive fetal movement.   OB History    Gravida Para Term Preterm AB Living   1             SAB TAB Ectopic Multiple Live Births                  Past Medical History:  Diagnosis Date  . Asthma   . 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 Diabetes mellitus without complication (HCC)    type 2  . Hypothyroidism   . Psychiatric disorder   . Stomach ulcer     Past Surgical History:  Procedure Laterality Date  . NO PAST SURGERIES      History reviewed. No pertinent family history.  Social History  Substance Use Topics  . Smoking status: Never Smoker  . Smokeless tobacco: Never Used  . Alcohol use No    Allergies: No Known Allergies  Prescriptions Prior to Admission  Medication Sig Dispense Refill Last Dose  . acetaminophen (TYLENOL) 500 MG tablet Take 1,000 mg by mouth every 6 (six) hours as needed.   06/21/2016 at Unknown time  . albuterol (PROVENTIL HFA;VENTOLIN HFA) 108 (90 Base) MCG/ACT inhaler Inhale 2 puffs into the lungs every 6 (six) hours as needed for wheezing or shortness of breath. 1 Inhaler 2 Taking  . cetirizine (ZYRTEC) 10 MG tablet Take 1 tablet (10 mg total) by mouth daily. 30 tablet 2   . dextromethorphan (DELSYM) 30 MG/5ML liquid Take 10 mLs (60 mg total) by mouth 2 (two) times daily as needed for cough. 89 mL 1   . glyBURIDE (DIABETA) 2.5 MG tablet Take 1 tablet (2.5 mg total) by mouth 2 (two) times daily with a meal.  60 tablet 1 not started  . metFORMIN (GLUCOPHAGE) 1000 MG tablet Take 1 tablet (1,000 mg total) by mouth 2 (two) times daily with a meal. 60 tablet 3 06/21/2016 at Unknown time  . Prenat-FeCbn-FeAspGl-FA-Omega (OB COMPLETE PETITE) 35-5-1-200 MG CAPS Take 1 tablet by mouth daily. 30 capsule 12 06/21/2016 at Unknown time  . promethazine (PHENERGAN) 25 MG suppository Place 1 suppository (25 mg total) rectally every 6 (six) hours as needed for nausea or vomiting. 12 each 0   . Vitamin D, Ergocalciferol, (DRISDOL) 50000 units CAPS capsule Take 1 capsule (50,000 Units total) by mouth every 7 (seven) days. 30 capsule 2 Past Week at Unknown time    Review of Systems  Constitutional: Negative.   Gastrointestinal: Negative.   Genitourinary: Negative.    Physical Exam   Blood pressure 136/70, pulse 85, temperature 98.4 F (36.9 C), temperature source Oral, resp. rate 18, last menstrual period 10/09/2015, unknown if currently breastfeeding.  Physical Exam  Nursing note and vitals reviewed. Constitutional: She is oriented to person, place, and time. She appears well-developed and well-nourished. No distress.  HENT:  Head: Normocephalic and atraumatic.  Eyes: Conjunctivae are normal. Right eye exhibits no discharge.  Left eye exhibits no discharge. No scleral icterus.  Neck: Normal range of motion.  Respiratory: Effort normal. No respiratory distress.  Neurological: She is alert and oriented to person, place, and time.  Skin: Skin is warm and dry. She is not diaphoretic.  Psychiatric: She has a normal mood and affect. Her behavior is normal. Judgment and thought content normal.   Fetal Tracing:  Baseline: 155 Variability: moderate Accelerations: none Decelerations:variables  Toco: irr ctx MAU Course  Procedures Results for orders placed or performed during the hospital encounter of 07/03/16 (from the past 24 hour(s))  Urinalysis, Routine w reflex microscopic     Status: Abnormal   Collection  Time: 07/03/16  5:29 PM  Result Value Ref Range   Color, Urine YELLOW YELLOW   APPearance HAZY (A) CLEAR   Specific Gravity, Urine 1.028 1.005 - 1.030   pH 5.0 5.0 - 8.0   Glucose, UA NEGATIVE NEGATIVE mg/dL   Hgb urine dipstick SMALL (A) NEGATIVE   Bilirubin Urine NEGATIVE NEGATIVE   Ketones, ur NEGATIVE NEGATIVE mg/dL   Protein, ur NEGATIVE NEGATIVE mg/dL   Nitrite NEGATIVE NEGATIVE   Leukocytes, UA TRACE (A) NEGATIVE   RBC / HPF 0-5 0 - 5 RBC/hpf   WBC, UA 6-30 0 - 5 WBC/hpf   Bacteria, UA RARE (A) NONE SEEN   Squamous Epithelial / LPF 6-30 (A) NONE SEEN   Mucous PRESENT    Ca Oxalate Crys, UA PRESENT     MDM Category 2 tracing S/w Dr. Adrian Blackwater. Will admit for IOL  Assessment and Plan  A:   Kristin Hart 07/03/2016, 6:02 PM

## 2016-07-03 NOTE — H&P (Signed)
OBSTETRIC ADMISSION HISTORY AND PHYSICAL  Kristin Hart is a 36 y.o. female G1P0 with IUP at [redacted]w[redacted]d by LMP presenting for IOL for NRFHR with BPP 8/8. She reports +FMs, No LOF, no VB, no blurry vision, headaches or peripheral edema, and RUQ pain.  She plans on breast feeding. She is undecided about birth control.  Dating: By LMP --->  Estimated Date of Delivery: 07/15/16  Prenatal History/Complications:  Past Medical History: Past Medical History:  Diagnosis Date  . Asthma   . 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 Diabetes mellitus without complication (HCC)    type 2  . Hypothyroidism   . Psychiatric disorder   . Stomach ulcer     Past Surgical History: Past Surgical History:  Procedure Laterality Date  . NO PAST SURGERIES      Obstetrical History: OB History    Gravida Para Term Preterm AB Living   1             SAB TAB Ectopic Multiple Live Births                  Social History: Social History   Social History  . Marital status: Single    Spouse name: N/A  . Number of children: N/A  . Years of education: N/A   Social History Main Topics  . Smoking status: Never Smoker  . Smokeless tobacco: Never Used  . Alcohol use No  . Drug use: Yes    Types: Methamphetamines     Comment: last used in Aug/Sept??  . Sexual activity: Yes   Other Topics Concern  . None   Social History Narrative  . None    Family History: History reviewed. No pertinent family history.  Allergies: No Known Allergies  Prescriptions Prior to Admission  Medication Sig Dispense Refill Last Dose  . acetaminophen (TYLENOL) 500 MG tablet Take 1,000 mg by mouth every 6 (six) hours as needed.   06/21/2016 at Unknown time  . albuterol (PROVENTIL HFA;VENTOLIN HFA) 108 (90 Base) MCG/ACT inhaler Inhale 2 puffs into the lungs every 6 (six) hours as needed for wheezing or shortness of breath. 1 Inhaler 2 Taking  . cetirizine (ZYRTEC) 10 MG tablet Take 1  tablet (10 mg total) by mouth daily. 30 tablet 2   . dextromethorphan (DELSYM) 30 MG/5ML liquid Take 10 mLs (60 mg total) by mouth 2 (two) times daily as needed for cough. 89 mL 1   . glyBURIDE (DIABETA) 2.5 MG tablet Take 1 tablet (2.5 mg total) by mouth 2 (two) times daily with a meal. 60 tablet 1 not started  . metFORMIN (GLUCOPHAGE) 1000 MG tablet Take 1 tablet (1,000 mg total) by mouth 2 (two) times daily with a meal. 60 tablet 3 06/21/2016 at Unknown time  . Prenat-FeCbn-FeAspGl-FA-Omega (OB COMPLETE PETITE) 35-5-1-200 MG CAPS Take 1 tablet by mouth daily. 30 capsule 12 06/21/2016 at Unknown time  . promethazine (PHENERGAN) 25 MG suppository Place 1 suppository (25 mg total) rectally every 6 (six) hours as needed for nausea or vomiting. 12 each 0   . Vitamin D, Ergocalciferol, (DRISDOL) 50000 units CAPS capsule Take 1 capsule (50,000 Units total) by mouth every 7 (seven) days. 30 capsule 2 Past Week at Unknown time     Review of Systems   All systems reviewed and negative except as stated in HPI  Blood pressure 136/70, pulse 85, temperature 98.4 F (36.9 C), temperature source Oral, resp. rate 18, last menstrual  period 10/09/2015, unknown if currently breastfeeding. General appearance: alert, cooperative and no distress Lungs: clear to auscultation bilaterally Heart: regular rate and rhythm Abdomen: soft, non-tender; bowel sounds normal Pelvic: 1/70/-3 Extremities: Homans sign is negative, no sign of DVT Presentation: cephalic Fetal monitoringBaseline: 155 bpm, Variability: Good {> 6 bpm), Accelerations: Reactive and Decelerations: Variable: mild Uterine activity: irregular      Prenatal labs: ABO, Rh: A/Positive/-- (12/13 1519) Antibody: Negative (12/13 1519) Rubella: immune RPR: Non Reactive (12/13 1519)  HBsAg: Negative (12/13 1519)  HIV: Non-reactive (02/01 0000)  GBS: Negative (04/13 0000)  1 hr Glucola 97 Genetic screening  Quad negative Anatomy US: wnl  Prenatal  Transfer Tool  Maternal Diabetes: No Genetic Screening: Normal Maternal Ultrasounds/Referrals: Normal Fetal Ultrasounds or other Referrals:  None Maternal Substance Abuse:  No Significant Maternal Medications:  Meds include: Other:   Metformin, glyburide Significant Maternal Lab Results: Lab values include: Group B Strep negative  Results for orders placed or performed during the hospital encounter of 07/03/16 (from the past 24 hour(s))  Urinalysis, Routine w reflex microscopic   Collection Time: 07/03/16  5:29 PM  Result Value Ref Range   Color, Urine YELLOW YELLOW   APPearance HAZY (A) CLEAR   Specific Gravity, Urine 1.028 1.005 - 1.030   pH 5.0 5.0 - 8.0   Glucose, UA NEGATIVE NEGATIVE mg/dL   Hgb urine dipstick SMALL (A) NEGATIVE   Bilirubin Urine NEGATIVE NEGATIVE   Ketones, ur NEGATIVE NEGATIVE mg/dL   Protein, ur NEGATIVE NEGATIVE mg/dL   Nitrite NEGATIVE NEGATIVE   Leukocytes, UA TRACE (A) NEGATIVE   RBC / HPF 0-5 0 - 5 RBC/hpf   WBC, UA 6-30 0 - 5 WBC/hpf   Bacteria, UA RARE (A) NONE SEEN   Squamous Epithelial / LPF 6-30 (A) NONE SEEN   Mucous PRESENT    Ca Oxalate Crys, UA PRESENT     Patient Active Problem List   Diagnosis Date Noted  . Depression affecting pregnancy in third trimester, antepartum 06/07/2016  . Marijuana use 06/07/2016  . Low vitamin D level 03/15/2016  . Supervision of normal first pregnancy 02/22/2016  . Diabetes mellitus affecting pregnancy 02/22/2016  . Hypothyroidism affecting pregnancy 02/22/2016  . Asthma affecting pregnancy, antepartum 02/22/2016  . Mental health disorder 02/22/2016  . History of methamphetamine use 02/22/2016    Assessment: Kristin Hart is a 36 y.o. G1P0 at [redacted]w[redacted]d here for IOL for [facility]  #Labor:Placed foley bulb and low dose pit (not to exceed 8 milliunits while foley bulb in place) #Pain: Fentanyl, epidural >3cm and upon maternal request #FWB: Category 2 tracing #ID:  GBS negative #MOF:  breast #MOC:undecided #Circ:  Yes  Durenda Hurt, MD 07/03/2016, 7:15 PM   OB FELLOW HISTORY AND PHYSICAL ATTESTATION  I have seen and examined this patient; I agree with above documentation in the resident's note.    Jen Mow, DO OB Fellow \

## 2016-07-03 NOTE — Progress Notes (Signed)
NR FHT - sent for BPP. Recurrent decelerations

## 2016-07-03 NOTE — Anesthesia Pain Management Evaluation Note (Signed)
  CRNA Pain Management Visit Note  Patient: Kristin Hart, 36 y.o., female  "Hello I am a member of the anesthesia team at [facility]. We have an anesthesia team available at all times to provide care throughout the hospital, including epidural management and anesthesia for C-section. I don't know your plan for the delivery whether it a natural birth, water birth, IV sedation, nitrous supplementation, doula or epidural, but we want to meet your pain goals."   1.Was your pain managed to your expectations on prior hospitalizations?   No prior hospitalizations  2.What is your expectation for pain management during this hospitalization?     Epidural and IV pain meds  3.How can we help you reach that goal? IV pain, epidural   Record the patient's initial score and the patient's pain goal.   Pain: 6  Pain Goal: 8 [facility] wants you to be able to say your pain was always managed very well.  Kristin Hart 07/03/2016

## 2016-07-03 NOTE — Progress Notes (Signed)
Pt reports she has a cough and is taking Delsym with good results.  She requests Rx to be sent to pharmacy - has Medicaid. Rx sent as requested. Pt is also taking Zyrtec for allergy sx with good results. Pt reports decreased FM x2 days. Pt taken to MFM for BPP due to NR NST today and FHR decels.

## 2016-07-03 NOTE — MAU Note (Signed)
Pt sent from clinic/MFM for prolonged monitoring. Had some decelerations in clinic.Pt c/o pelvic pressure n=and pain

## 2016-07-04 ENCOUNTER — Inpatient Hospital Stay (HOSPITAL_COMMUNITY): Payer: Medicaid Other | Admitting: Anesthesiology

## 2016-07-04 ENCOUNTER — Encounter (HOSPITAL_COMMUNITY): Payer: Self-pay | Admitting: *Deleted

## 2016-07-04 ENCOUNTER — Encounter (HOSPITAL_COMMUNITY)
Admission: AD | Disposition: A | Discharge status: Home / Self Care | Payer: Self-pay | Source: Ambulatory Visit | Attending: Family Medicine

## 2016-07-04 DIAGNOSIS — Z3A38 38 weeks gestation of pregnancy: Secondary | ICD-10-CM

## 2016-07-04 LAB — COMPREHENSIVE METABOLIC PANEL
ALT: 16 U/L (ref 14–54)
AST: 24 U/L (ref 15–41)
Albumin: 2.7 g/dL — ABNORMAL LOW (ref 3.5–5.0)
Alkaline Phosphatase: 88 U/L (ref 38–126)
Anion gap: 9 (ref 5–15)
BUN: 19 mg/dL (ref 6–20)
CO2: 23 mmol/L (ref 22–32)
Calcium: 9.4 mg/dL (ref 8.9–10.3)
Chloride: 104 mmol/L (ref 101–111)
Creatinine, Ser: 0.76 mg/dL (ref 0.44–1.00)
GFR calc Af Amer: >60 mL/min (ref >60)
GFR calc non Af Amer: >60 mL/min (ref >60)
Glucose, Bld: 123 mg/dL — ABNORMAL HIGH (ref 65–99)
Potassium: 4.1 mmol/L (ref 3.5–5.1)
Sodium: 136 mmol/L (ref 135–145)
Total Bilirubin: 0.5 mg/dL (ref 0.3–1.2)
Total Protein: 6 g/dL — ABNORMAL LOW (ref 6.5–8.1)

## 2016-07-04 LAB — PROTEIN / CREATININE RATIO, URINE
Creatinine, Urine: 137 mg/dL
Protein Creatinine Ratio: 0.2 mg/mg{Cre} — ABNORMAL HIGH (ref 0.00–0.15)
Total Protein, Urine: 27 mg/dL

## 2016-07-04 LAB — GLUCOSE, CAPILLARY
Glucose-Capillary: 242 mg/dL — ABNORMAL HIGH (ref 65–99)
Glucose-Capillary: 98 mg/dL (ref 65–99)

## 2016-07-04 LAB — ABO/RH: ABO/RH(D): A POS

## 2016-07-04 LAB — RPR: RPR Ser Ql: NONREACTIVE

## 2016-07-04 SURGERY — Surgical Case
Anesthesia: Epidural

## 2016-07-04 MED ORDER — COCONUT OIL OIL
1.0000 "application " | TOPICAL_OIL | Status: DC | PRN
  Administered 2016-07-05: 1 via TOPICAL
  Filled 2016-07-04: qty 120

## 2016-07-04 MED ORDER — FENTANYL CITRATE (PF) 100 MCG/2ML IJ SOLN
INTRAMUSCULAR | Status: DC | PRN
  Administered 2016-07-04 (×2): 250 ug via INTRAVENOUS

## 2016-07-04 MED ORDER — FENTANYL CITRATE (PF) 250 MCG/5ML IJ SOLN
INTRAMUSCULAR | Status: AC
  Filled 2016-07-04: qty 5

## 2016-07-04 MED ORDER — LACTATED RINGERS IV SOLN
INTRAVENOUS | Status: DC
  Administered 2016-07-04: 1 mL via INTRAUTERINE

## 2016-07-04 MED ORDER — LACTATED RINGERS IV SOLN
INTRAVENOUS | Status: DC
Start: 2016-07-04 — End: 2016-07-07
  Administered 2016-07-04 – 2016-07-05 (×3): via INTRAVENOUS

## 2016-07-04 MED ORDER — PRENATAL MULTIVITAMIN CH
1.0000 | ORAL_TABLET | Freq: Every day | ORAL | Status: DC
  Administered 2016-07-04 – 2016-07-07 (×4): 1 via ORAL
  Filled 2016-07-04 (×4): qty 1

## 2016-07-04 MED ORDER — MENTHOL 3 MG MT LOZG: 1.0000 | LOZENGE | OROMUCOSAL | Status: DC | PRN

## 2016-07-04 MED ORDER — SODIUM CHLORIDE 0.9% FLUSH: 9.0000 mL | INTRAVENOUS | Status: DC | PRN

## 2016-07-04 MED ORDER — SIMETHICONE 80 MG PO CHEW: 80.0000 mg | CHEWABLE_TABLET | ORAL | Status: DC | PRN

## 2016-07-04 MED ORDER — SCOPOLAMINE 1 MG/3DAYS TD PT72
MEDICATED_PATCH | TRANSDERMAL | Status: AC
  Filled 2016-07-04: qty 1

## 2016-07-04 MED ORDER — ONDANSETRON HCL 4 MG/2ML IJ SOLN
INTRAMUSCULAR | Status: DC | PRN
  Administered 2016-07-04: 4 mg via INTRAVENOUS

## 2016-07-04 MED ORDER — DIPHENHYDRAMINE HCL 25 MG PO CAPS: 25.0000 mg | ORAL_CAPSULE | Freq: Four times a day (QID) | ORAL | Status: DC | PRN

## 2016-07-04 MED ORDER — DIBUCAINE 1 % RE OINT: 1.0000 "application " | TOPICAL_OINTMENT | RECTAL | Status: DC | PRN

## 2016-07-04 MED ORDER — HYDROMORPHONE HCL 1 MG/ML IJ SOLN
0.2500 mg | INTRAMUSCULAR | Status: DC | PRN
  Administered 2016-07-04 (×2): 0.5 mg via INTRAVENOUS

## 2016-07-04 MED ORDER — NALOXONE HCL 0.4 MG/ML IJ SOLN: 0.4000 mg | INTRAMUSCULAR | Status: DC | PRN

## 2016-07-04 MED ORDER — SCOPOLAMINE 1 MG/3DAYS TD PT72
MEDICATED_PATCH | TRANSDERMAL | Status: DC | PRN
  Administered 2016-07-04: 1 via TRANSDERMAL

## 2016-07-04 MED ORDER — MIDAZOLAM HCL 2 MG/2ML IJ SOLN
INTRAMUSCULAR | Status: DC | PRN
  Administered 2016-07-04: 2 mg via INTRAVENOUS

## 2016-07-04 MED ORDER — DIPHENHYDRAMINE HCL 12.5 MG/5ML PO ELIX
12.5000 mg | ORAL_SOLUTION | Freq: Four times a day (QID) | ORAL | Status: DC | PRN
  Filled 2016-07-04: qty 5

## 2016-07-04 MED ORDER — CEFAZOLIN SODIUM-DEXTROSE 2-4 GM/100ML-% IV SOLN
INTRAVENOUS | Status: AC
  Filled 2016-07-04: qty 100

## 2016-07-04 MED ORDER — METOCLOPRAMIDE HCL 5 MG/ML IJ SOLN: 10.0000 mg | Freq: Once | INTRAMUSCULAR | Status: DC | PRN

## 2016-07-04 MED ORDER — LIDOCAINE-EPINEPHRINE 2 %-1:100000 IJ SOLN
INTRAMUSCULAR | Status: DC | PRN
  Administered 2016-07-04: 5 mL via INTRADERMAL
  Administered 2016-07-04: 10 mL via INTRADERMAL
  Administered 2016-07-04: 5 mL via INTRADERMAL

## 2016-07-04 MED ORDER — ONDANSETRON HCL 4 MG/2ML IJ SOLN
4.0000 mg | Freq: Four times a day (QID) | INTRAMUSCULAR | Status: DC | PRN
  Administered 2016-07-04: 4 mg via INTRAVENOUS
  Filled 2016-07-04: qty 2

## 2016-07-04 MED ORDER — OXYTOCIN 40 UNITS IN LACTATED RINGERS INFUSION - SIMPLE MED: 2.5000 [IU]/h | INTRAVENOUS | Status: AC

## 2016-07-04 MED ORDER — WITCH HAZEL-GLYCERIN EX PADS: 1.0000 "application " | MEDICATED_PAD | CUTANEOUS | Status: DC | PRN

## 2016-07-04 MED ORDER — HYDROMORPHONE HCL 1 MG/ML IJ SOLN
INTRAMUSCULAR | Status: AC
  Administered 2016-07-04: 0.5 mg via INTRAVENOUS
  Filled 2016-07-04: qty 1

## 2016-07-04 MED ORDER — SUCCINYLCHOLINE CHLORIDE 200 MG/10ML IV SOSY
PREFILLED_SYRINGE | INTRAVENOUS | Status: AC
  Filled 2016-07-04: qty 10

## 2016-07-04 MED ORDER — ACETAMINOPHEN 325 MG PO TABS
650.0000 mg | ORAL_TABLET | ORAL | Status: DC | PRN
  Administered 2016-07-04 – 2016-07-05 (×5): 650 mg via ORAL
  Filled 2016-07-04 (×5): qty 2

## 2016-07-04 MED ORDER — PROPOFOL 10 MG/ML IV BOLUS
INTRAVENOUS | Status: AC
  Filled 2016-07-04: qty 20

## 2016-07-04 MED ORDER — DEXAMETHASONE SODIUM PHOSPHATE 10 MG/ML IJ SOLN
INTRAMUSCULAR | Status: DC | PRN
  Administered 2016-07-04: 10 mg via INTRAVENOUS

## 2016-07-04 MED ORDER — MEPERIDINE HCL 25 MG/ML IJ SOLN: 6.2500 mg | INTRAMUSCULAR | Status: DC | PRN

## 2016-07-04 MED ORDER — SODIUM BICARBONATE 8.4 % IV SOLN
INTRAVENOUS | Status: AC
  Filled 2016-07-04: qty 50

## 2016-07-04 MED ORDER — LIDOCAINE-EPINEPHRINE (PF) 2 %-1:200000 IJ SOLN
INTRAMUSCULAR | Status: AC
  Filled 2016-07-04: qty 20

## 2016-07-04 MED ORDER — MIDAZOLAM HCL 2 MG/2ML IJ SOLN
INTRAMUSCULAR | Status: AC
  Filled 2016-07-04: qty 2

## 2016-07-04 MED ORDER — KETOROLAC TROMETHAMINE 30 MG/ML IJ SOLN
INTRAMUSCULAR | Status: AC
  Administered 2016-07-04: 30 mg via INTRAMUSCULAR
  Filled 2016-07-04: qty 1

## 2016-07-04 MED ORDER — HYDROMORPHONE 1 MG/ML IV SOLN
INTRAVENOUS | Status: DC
  Administered 2016-07-04 (×2): via INTRAVENOUS
  Filled 2016-07-04: qty 25

## 2016-07-04 MED ORDER — TERBUTALINE SULFATE 1 MG/ML IJ SOLN
INTRAMUSCULAR | Status: AC
  Administered 2016-07-04: 05:00:00
  Filled 2016-07-04: qty 1

## 2016-07-04 MED ORDER — IBUPROFEN 600 MG PO TABS
600.0000 mg | ORAL_TABLET | Freq: Four times a day (QID) | ORAL | Status: DC
  Administered 2016-07-04 – 2016-07-07 (×13): 600 mg via ORAL
  Filled 2016-07-04 (×13): qty 1

## 2016-07-04 MED ORDER — CEFAZOLIN SODIUM-DEXTROSE 2-3 GM-% IV SOLR
INTRAVENOUS | Status: DC | PRN
  Administered 2016-07-04: 2 g via INTRAVENOUS

## 2016-07-04 MED ORDER — LIDOCAINE HCL (PF) 1 % IJ SOLN
INTRAMUSCULAR | Status: DC | PRN
  Administered 2016-07-04: 13 mL via EPIDURAL

## 2016-07-04 MED ORDER — ZOLPIDEM TARTRATE 5 MG PO TABS: 5.0000 mg | ORAL_TABLET | Freq: Every evening | ORAL | Status: DC | PRN

## 2016-07-04 MED ORDER — LIDOCAINE HCL (CARDIAC) 20 MG/ML IV SOLN
INTRAVENOUS | Status: AC
  Filled 2016-07-04: qty 5

## 2016-07-04 MED ORDER — KETOROLAC TROMETHAMINE 30 MG/ML IJ SOLN
30.0000 mg | Freq: Once | INTRAMUSCULAR | Status: AC
  Administered 2016-07-04: 30 mg via INTRAMUSCULAR

## 2016-07-04 MED ORDER — ONDANSETRON HCL 4 MG/2ML IJ SOLN
INTRAMUSCULAR | Status: AC
  Filled 2016-07-04: qty 2

## 2016-07-04 MED ORDER — OXYTOCIN 10 UNIT/ML IJ SOLN
INTRAMUSCULAR | Status: AC
  Filled 2016-07-04: qty 4

## 2016-07-04 MED ORDER — LACTATED RINGERS IV SOLN
INTRAVENOUS | Status: DC | PRN
  Administered 2016-07-04: 06:00:00 via INTRAVENOUS

## 2016-07-04 MED ORDER — SIMETHICONE 80 MG PO CHEW
80.0000 mg | CHEWABLE_TABLET | Freq: Three times a day (TID) | ORAL | Status: DC
  Administered 2016-07-04 – 2016-07-07 (×9): 80 mg via ORAL
  Filled 2016-07-04 (×9): qty 1

## 2016-07-04 MED ORDER — DEXAMETHASONE SODIUM PHOSPHATE 10 MG/ML IJ SOLN
INTRAMUSCULAR | Status: AC
  Filled 2016-07-04: qty 1

## 2016-07-04 MED ORDER — DIPHENHYDRAMINE HCL 50 MG/ML IJ SOLN: 12.5000 mg | Freq: Four times a day (QID) | INTRAMUSCULAR | Status: DC | PRN

## 2016-07-04 MED ORDER — TETANUS-DIPHTH-ACELL PERTUSSIS 5-2.5-18.5 LF-MCG/0.5 IM SUSP: 0.5000 mL | Freq: Once | INTRAMUSCULAR | Status: DC

## 2016-07-04 MED ORDER — SENNOSIDES-DOCUSATE SODIUM 8.6-50 MG PO TABS
2.0000 | ORAL_TABLET | ORAL | Status: DC
  Administered 2016-07-04 – 2016-07-06 (×3): 2 via ORAL
  Filled 2016-07-04 (×3): qty 2

## 2016-07-04 MED ORDER — SIMETHICONE 80 MG PO CHEW
80.0000 mg | CHEWABLE_TABLET | ORAL | Status: DC
  Administered 2016-07-04 – 2016-07-06 (×3): 80 mg via ORAL
  Filled 2016-07-04 (×3): qty 1

## 2016-07-04 SURGICAL SUPPLY — 32 items
APL SKNCLS STERI-STRIP NONHPOA (GAUZE/BANDAGES/DRESSINGS) ×1
BENZOIN TINCTURE PRP APPL 2/3 (GAUZE/BANDAGES/DRESSINGS) ×3 IMPLANT
CHLORAPREP W/TINT 26ML (MISCELLANEOUS) ×3 IMPLANT
CLAMP CORD UMBIL (MISCELLANEOUS) IMPLANT
CLOSURE WOUND 1/2 X4 (GAUZE/BANDAGES/DRESSINGS) ×1
CLOTH BEACON ORANGE TIMEOUT ST (SAFETY) ×3 IMPLANT
DRSG OPSITE POSTOP 4X10 (GAUZE/BANDAGES/DRESSINGS) ×3 IMPLANT
ELECT REM PT RETURN 9FT ADLT (ELECTROSURGICAL) ×3
ELECTRODE REM PT RTRN 9FT ADLT (ELECTROSURGICAL) ×1 IMPLANT
EXTRACTOR VACUUM M CUP 4 TUBE (SUCTIONS) IMPLANT
EXTRACTOR VACUUM M CUP 4' TUBE (SUCTIONS)
GLOVE BIOGEL PI IND STRL 7.0 (GLOVE) ×2 IMPLANT
GLOVE BIOGEL PI IND STRL 7.5 (GLOVE) ×4 IMPLANT
GLOVE BIOGEL PI INDICATOR 7.0 (GLOVE) ×4
GLOVE BIOGEL PI INDICATOR 7.5 (GLOVE) ×8
GLOVE ECLIPSE 7.5 STRL STRAW (GLOVE) ×12 IMPLANT
GOWN STRL REUS W/TWL LRG LVL3 (GOWN DISPOSABLE) ×9 IMPLANT
KIT ABG SYR 3ML LUER SLIP (SYRINGE) ×3 IMPLANT
NEEDLE HYPO 25X5/8 SAFETYGLIDE (NEEDLE) IMPLANT
NS IRRIG 1000ML POUR BTL (IV SOLUTION) ×3 IMPLANT
PACK C SECTION WH (CUSTOM PROCEDURE TRAY) ×3 IMPLANT
PAD OB MATERNITY 4.3X12.25 (PERSONAL CARE ITEMS) ×3 IMPLANT
PENCIL SMOKE EVAC W/HOLSTER (ELECTROSURGICAL) ×3 IMPLANT
RTRCTR C-SECT PINK 25CM LRG (MISCELLANEOUS) ×3 IMPLANT
STRIP CLOSURE SKIN 1/2X4 (GAUZE/BANDAGES/DRESSINGS) ×2 IMPLANT
SUT VIC AB 0 CTX 36 (SUTURE) ×6
SUT VIC AB 0 CTX36XBRD ANBCTRL (SUTURE) ×3 IMPLANT
SUT VIC AB 2-0 CT1 27 (SUTURE) ×3
SUT VIC AB 2-0 CT1 TAPERPNT 27 (SUTURE) ×1 IMPLANT
SUT VIC AB 4-0 KS 27 (SUTURE) ×3 IMPLANT
TOWEL OR 17X24 6PK STRL BLUE (TOWEL DISPOSABLE) ×3 IMPLANT
TRAY FOLEY BAG SILVER LF 14FR (SET/KITS/TRAYS/PACK) IMPLANT

## 2016-07-04 NOTE — Anesthesia Postprocedure Evaluation (Signed)
Anesthesia Post Note  Patient: Kristin Hart  Procedure(s) Performed: Procedure(s) (LRB): CESAREAN SECTION (N/A)  Patient location during evaluation: PACU Anesthesia Type: Epidural and General Level of consciousness: awake and alert and oriented Pain management: pain level controlled Vital Signs Assessment: post-procedure vital signs reviewed and stable Respiratory status: spontaneous breathing, nonlabored ventilation and respiratory function stable Cardiovascular status: blood pressure returned to baseline and stable Postop Assessment: no signs of nausea or vomiting, no headache and no backache Anesthetic complications: no        Last Vitals:  Vitals:   07/04/16 0730 07/04/16 0800  BP: (!) 142/71 126/84  Pulse: 93 98  Resp: 14 14  Temp: 37.1 C     Last Pain:  Vitals:   07/04/16 0800  TempSrc:   PainSc: 4    Pain Goal: Patients Stated Pain Goal: 3 (07/04/16 0800)               Yessica Putnam A.

## 2016-07-04 NOTE — Lactation Note (Signed)
This note was copied from a baby's chart. Lactation Consultation Note  Patient Name: Kristin Hart [id number] Date: 07/04/2016 Reason for consult: Initial assessment;NICU baby Breastfeeding consultation services and support information given to mom.  Providing Breastmilk For Your Baby In NICU booklet given.  Mom is very groggy from pain meds so teaching will need reinforced.  Symphony pump set up and initiated.  Taught mom hand expression.  Small drops of colostrum obtained.  Encouraged to call with concerns/assist.  Maternal Data Has patient been taught Hand Expression?: Yes  Feeding    LATCH Score/Interventions                      Lactation Tools Discussed/Used Pump Review: Setup, frequency, and cleaning;Milk Storage Initiated by:: LC Date initiated:: 07/04/16   Consult Status Consult Status: Follow-up Date: 07/05/16 Follow-up type: In-patient    Huston Foley 07/04/2016, 1:55 PM

## 2016-07-04 NOTE — Anesthesia Preprocedure Evaluation (Signed)
Anesthesia Evaluation  Patient identified by MRN, date of birth, ID band Patient awake    Reviewed: Allergy & Precautions, NPO status , Patient's Chart, lab work & pertinent test results  Airway Mallampati: II  TM Distance: >3 FB Neck ROM: Full    Dental no notable dental hx.    Pulmonary neg pulmonary ROS, asthma ,    Pulmonary exam normal breath sounds clear to auscultation       Cardiovascular negative cardio ROS Normal cardiovascular exam Rhythm:Regular Rate:Normal     Neuro/Psych negative neurological ROS  negative psych ROS   GI/Hepatic negative GI ROS, Neg liver ROS,   Endo/Other  negative endocrine ROSdiabetes, Gestational  Renal/GU negative Renal ROS  negative genitourinary   Musculoskeletal negative musculoskeletal ROS (+)   Abdominal   Peds negative pediatric ROS (+)  Hematology negative hematology ROS (+)   Anesthesia Other Findings   Reproductive/Obstetrics negative OB ROS                             Anesthesia Physical Anesthesia Plan  ASA: II  Anesthesia Plan: Epidural   Post-op Pain Management:    Induction:   Airway Management Planned:   Additional Equipment:   Intra-op Plan:   Post-operative Plan:   Informed Consent:   Plan Discussed with:   Anesthesia Plan Comments:         Anesthesia Quick Evaluation

## 2016-07-04 NOTE — Progress Notes (Signed)
Patient ID: Kristin Hart, female   DOB: 12/24/1980, 36 y.o.   MRN: [id number]  Late documentation; Code cesarean called due to prolonged bradycardia with minimal improvement with repositioning, fluid bolus, terbutaline. Patient verbally consented for cesarean. Due to need for rapid procedure, no written consent was done.  [name], DO 07/04/2016 6:45 AM

## 2016-07-04 NOTE — Addendum Note (Signed)
Addendum  created 07/04/16 1414 by Elgie Congo, CRNA   Sign clinical note

## 2016-07-04 NOTE — Op Note (Signed)
Kristin Hart PROCEDURE DATE: 07/03/2016 - 07/04/2016  PREOPERATIVE DIAGNOSIS: Intrauterine pregnancy at  [redacted]w[redacted]d weeks gestation; non-reassuring fetal status  POSTOPERATIVE DIAGNOSIS: The same  PROCEDURE: Primary Low Transverse Cesarean Section  SURGEON:  Dr. Candelaria Celeste  ASSISTANT:   INDICATIONS: Kristin Hart is a 36 y.o. G1P0 at [redacted]w[redacted]d scheduled for cesarean section secondary to non-reassuring fetal status.  The risks of cesarean section discussed with the patient included but were not limited to: bleeding which may require transfusion or reoperation; infection which may require antibiotics; injury to bowel, bladder, ureters or other surrounding organs; injury to the fetus; need for additional procedures including hysterectomy in the event of a life-threatening hemorrhage; placental abnormalities wth subsequent pregnancies, incisional problems, thromboembolic phenomenon and other postoperative/anesthesia complications. The patient concurred with the proposed plan, giving informed written consent for the procedure.    FINDINGS:  Viable female infant in vertex presentation.  Apgars 6 and 7, weight pending.  Clear amniotic fluid.  Intact placenta, three vessel cord.  Normal uterus, fallopian tubes and ovaries bilaterally. Cord pH pending  ANESTHESIA:    Spinal INTRAVENOUS FLUIDS:2000 ml ESTIMATED BLOOD LOSS: 800 ml URINE OUTPUT:  1100 ml SPECIMENS: Placenta sent to pathology COMPLICATIONS: None immediate  PROCEDURE IN DETAIL:  The patient received intravenous antibiotics and had sequential compression devices applied to her lower extremities while in the preoperative area.  She was then taken to the operating room where epidural anesthesia was dosed up to surgical level, but found to be inadequate. The then underwent general anesthesia. She was then placed in a dorsal supine position with a leftward tilt, and prepped and draped in a sterile manner.  A foley catheter had been placed into her  bladder and attached to constant gravity, which drained clear fluid throughout.  After an adequate timeout was performed, a Pfannenstiel skin incision was made with scalpel and carried through to the underlying layer of fascia. The fascia was incised in the midline and this incision was extended bilaterally using the Mayo scissors. Kocher clamps were applied to the superior aspect of the fascial incision and the underlying rectus muscles were dissected off bluntly. A similar process was carried out on the inferior aspect of the facial incision. The rectus muscles were separated in the midline bluntly and the peritoneum was entered bluntly. An Alexis retractor was placed to aid in visualization of the uterus.  Attention was turned to the lower uterine segment where a transverse hysterotomy was made with a scalpel and extended bilaterally bluntly. The infant was successfully delivered, and cord was clamped and cut and infant was handed over to awaiting neonatology team. Uterine massage was then administered and the placenta delivered intact with three-vessel cord. The uterus was then cleared of clot and debris.  The hysterotomy was closed with 0 Vicryl in a running locked fashion, and an imbricating layer was also placed with a 0 Vicryl. Overall, excellent hemostasis was noted. The abdomen and the pelvis were cleared of all clot and debris and the Jon Gills was removed. Hemostasis was confirmed on all surfaces.  The peritoneum was reapproximated using 2-0 vicryl running stitches. The fascia was then closed using 0 Vicryl in a running fashion. The skin was closed with 4-0 vicryl. The patient tolerated the procedure well. Sponge, lap, instrument and needle counts were correct x 2. She was taken to the recovery room in stable condition.    Kristin Heritage, DO 07/04/2016 6:47 AM

## 2016-07-04 NOTE — Transfer of Care (Signed)
Immediate Anesthesia Transfer of Care Note  Patient: Kristin Hart  Procedure(s) Performed: Procedure(s): CESAREAN SECTION (N/A)  Patient Location: PACU  Anesthesia Type:General  Level of Consciousness: awake, alert , oriented and patient cooperative  Airway & Oxygen Therapy: Patient Spontanous Breathing and Patient connected to nasal cannula oxygen  Post-op Assessment: Report given to RN, Post -op Vital signs reviewed and stable and Patient moving all extremities X 4  Post vital signs: Reviewed and stable  Last Vitals:  Vitals:   07/04/16 0501 07/04/16 0630  BP: 137/67   Pulse: 89   Resp: 16   Temp:  36.7 C    Last Pain:  Vitals:   07/04/16 0401  TempSrc:   PainSc: 0-No pain         Complications: No apparent anesthesia complications

## 2016-07-04 NOTE — Progress Notes (Signed)
I offered support to pt who is still unable to be in a sitting position and has not been able to see her baby in NICU yet.  I offered prayer, at her request, for her son, whom they are calling Quad.    We will check in with the family as we are able over the next several days, but please feel welcome to page as needs arise.  Chaplain Dyanne Carrel, Bcc Pager, [phone] 3:57 PM    07/04/16 1500  Clinical Encounter Type  Visited With Patient  Visit Type Spiritual support  Referral From Care management  Spiritual Encounters  Spiritual Needs Prayer;Emotional

## 2016-07-04 NOTE — Anesthesia Procedure Notes (Signed)
Procedure Name: Intubation Date/Time: 07/04/2016 5:36 AM Performed by: Gilmer Mor R Pre-anesthesia Checklist: Patient identified, Patient being monitored, Timeout performed, Emergency Drugs available and Suction available Patient Re-evaluated:Patient Re-evaluated prior to inductionOxygen Delivery Method: Circle System Utilized Preoxygenation: Pre-oxygenation with 100% oxygen Intubation Type: IV induction, Cricoid Pressure applied and Rapid sequence Laryngoscope Size: Mac and 3 Grade View: Grade II Tube type: Oral Tube size: 7.0 mm Number of attempts: 1 Airway Equipment and Method: stylet Placement Confirmation: ETT inserted through vocal cords under direct vision,  positive ETCO2 and breath sounds checked- equal and bilateral Secured at: 21 cm Tube secured with: Tape Dental Injury: Teeth and Oropharynx as per pre-operative assessment

## 2016-07-04 NOTE — Progress Notes (Signed)
Patient ID: Kristin Hart, female   DOB: 05/15/1980, 36 y.o.   MRN: [id number] Called to evaluate strip  Kristin Hart is a 36 y.o. G1P0 at [redacted]w[redacted]d admitted for induction of labor due to [facility]  Subjective: Pt feeling some pressure  Objective: BP (!) 143/89   Pulse 74   Temp 98.3 F (36.8 C) (Oral)   Resp 16   Ht 5' 8.5" (1.74 m)   Wt 101.6 kg (224 lb)   LMP 10/09/2015 (Approximate)   SpO2 99%   BMI 33.56 kg/m  No intake/output data recorded.  FHT:  FHR: 155 bpm, variability: minimal ,  accelerations:  Abscent,  decelerations:  Present uc's not tracing well- suspicious for lates UC:   ~ q 2-31mins  SVE:  6/90/-1, vtx  AROM mod amt mod MSF IUPC placed w/o difficulty ~44min prolonged right after arom>FSE placed Then another prolonged> pt turned to hands and knees, gave dose of terbutaline- Dr. Adrian Blackwater called to room  Pitocin-recently turned off d/t decels, was at 8mu/min  Labs: Lab Results  Component Value Date   WBC 14.6 (H) 07/03/2016   HGB 13.0 07/03/2016   HCT 38.9 07/03/2016   MCV 91.7 07/03/2016   PLT 233 07/03/2016    Assessment / Plan: IOL d/t NRFHR, s/p foley bulb, pit was at 35mu/min prior to being turned off, now w/ IUPC- will do amnioinfusion bolus, then 144ml/hr, gave dose of terbutaline, fhr stable now stable, will continue to monitor. Dr. Adrian Blackwater came to room to evaluate- discussed potential need for c/s w/ pt if fhr indicates.   Labor: Progressing normally Fetal Wellbeing:  Category II Pain Control:  Epidural Pre-eclampsia: few elevated bp's, pre-e labs normal I/D:  n/a Anticipated MOD:  dependent on FHR  Marge Duncans CNM, WHNP-BC 07/04/2016, 3:59 AM

## 2016-07-04 NOTE — Anesthesia Procedure Notes (Signed)
Epidural Patient location during procedure: OB Start time: 07/04/2016 1:12 AM End time: 07/04/2016 1:28 AM  Staffing Anesthesiologist: Anitra Lauth RAY Performed: anesthesiologist   Preanesthetic Checklist Completed: patient identified, site marked, surgical consent, pre-op evaluation, timeout performed, IV checked, risks and benefits discussed and monitors and equipment checked  Epidural Patient position: sitting Prep: DuraPrep Patient monitoring: heart rate, cardiac monitor, continuous pulse ox and blood pressure Approach: midline Location: L2-L3 Injection technique: LOR saline  Needle:  Needle type: Tuohy  Needle gauge: 17 G Needle length: 9 cm Needle insertion depth: 6 cm Catheter type: closed end flexible Catheter size: 20 Guage Catheter at skin depth: 10 cm Test dose: negative  Assessment Events: blood not aspirated, injection not painful, no injection resistance, negative IV test and no paresthesia  Additional Notes Reason for block:procedure for pain

## 2016-07-04 NOTE — Anesthesia Postprocedure Evaluation (Signed)
Anesthesia Post Note  Patient: Kristin Hart  Procedure(s) Performed: Procedure(s) (LRB): CESAREAN SECTION (N/A)  Anesthesia Type: Epidural Level of consciousness: awake and alert and oriented Pain management: pain level controlled Vital Signs Assessment: post-procedure vital signs reviewed and stable Respiratory status: spontaneous breathing and nonlabored ventilation Cardiovascular status: stable Postop Assessment: patient able to bend at knees, no backache, no signs of nausea or vomiting, epidural receding, adequate PO intake and no headache Anesthetic complications: no        Last Vitals:  Vitals:   07/04/16 1141 07/04/16 1330  BP: 116/67   Pulse: 85   Resp: 20 19  Temp: 36.6 C     Last Pain:  Vitals:   07/04/16 1330  TempSrc:   PainSc: 3    Pain Goal: Patients Stated Pain Goal: 3 (07/04/16 0815)               [name]

## 2016-07-05 ENCOUNTER — Ambulatory Visit (HOSPITAL_COMMUNITY): Payer: Medicaid Other

## 2016-07-05 ENCOUNTER — Other Ambulatory Visit: Payer: Self-pay | Admitting: Obstetrics and Gynecology

## 2016-07-05 LAB — CBC
HCT: 28.8 % — ABNORMAL LOW (ref 36.0–46.0)
Hemoglobin: 9.7 g/dL — ABNORMAL LOW (ref 12.0–15.0)
MCH: 31.1 pg (ref 26.0–34.0)
MCHC: 33.7 g/dL (ref 30.0–36.0)
MCV: 92.3 fL (ref 78.0–100.0)
Platelets: 179 10*3/uL (ref 150–400)
RBC: 3.12 MIL/uL — ABNORMAL LOW (ref 3.87–5.11)
RDW: 13.1 % (ref 11.5–15.5)
WBC: 14 10*3/uL — ABNORMAL HIGH (ref 4.0–10.5)

## 2016-07-05 LAB — GLUCOSE, CAPILLARY: Glucose-Capillary: 110 mg/dL — ABNORMAL HIGH (ref 65–99)

## 2016-07-05 MED ORDER — OXYCODONE HCL 5 MG PO TABS
5.0000 mg | ORAL_TABLET | ORAL | Status: DC | PRN
Start: 2016-07-05 — End: 2016-07-07
  Administered 2016-07-05 (×2): 5 mg via ORAL
  Filled 2016-07-05 (×2): qty 1

## 2016-07-05 MED ORDER — PNEUMOCOCCAL VAC POLYVALENT 25 MCG/0.5ML IJ INJ
0.5000 mL | INJECTION | INTRAMUSCULAR | Status: DC
  Filled 2016-07-05: qty 0.5

## 2016-07-05 MED ORDER — OXYCODONE HCL 5 MG PO TABS
10.0000 mg | ORAL_TABLET | ORAL | Status: DC | PRN
  Administered 2016-07-05 – 2016-07-07 (×9): 10 mg via ORAL
  Filled 2016-07-05 (×9): qty 2

## 2016-07-05 MED ORDER — ALBUTEROL SULFATE (2.5 MG/3ML) 0.083% IN NEBU
2.5000 mg | INHALATION_SOLUTION | RESPIRATORY_TRACT | Status: DC | PRN
  Administered 2016-07-05: 2.5 mg via RESPIRATORY_TRACT
  Filled 2016-07-05: qty 3

## 2016-07-05 NOTE — Lactation Note (Signed)
This note was copied from a baby's chart. Lactation Consultation Note; Mom up in room as I went into room. Reports she pumped once yesterday and obtained a few drops of Colostrum. Has not pumped since. Offered assist with pumping and mom agreeable. Reviewed setup, use and cleaning of pump pieces. Mom pumped for 15 min and obtained 1/2 bullet of EBM to take to NICU. Very pleased that she had obtained that much milk. Plans to go to NICU now to take milk to baby.Encouraged to ask NICU nurse for BM labels. She demonstrated correct setup of pump pieces. Does not have pump for home. May get one from family member. Does not have WIC at this time.   Patient Name: Kristin Hart [id number] Date: 07/05/2016 Reason for consult: Follow-up assessment;NICU baby   Maternal Data Formula Feeding for Exclusion: No Has patient been taught Hand Expression?: Yes Does the patient have breastfeeding experience prior to this delivery?: No  Feeding    LATCH Score/Interventions                      Lactation Tools Discussed/Used WIC Program: No Pump Review: Setup, frequency, and cleaning Date initiated:: 07/04/16   Consult Status Consult Status: Follow-up Date: 07/06/16 Follow-up type: In-patient    Pamelia Hoit 07/05/2016, 1:03 PM

## 2016-07-05 NOTE — Progress Notes (Signed)
Post Op Day 1 Subjective:  Kristin Hart is a 36 y.o. G1P1000 [redacted]w[redacted]d s/p pLTCS for nonreassuring fetal status.  No acute events overnight.  Pt denies problems with ambulating, voiding or po intake.  She denies nausea or vomiting.  Pain is well controlled, has only used 2.2mg  of dilaudid overnight.  She has had flatus. She has not had bowel movement.  Lochia Small.  Plan for birth control is undecided.  Method of Feeding: breast/bottle  Objective: Blood pressure 127/74, pulse 72, temperature 98.1 F (36.7 C), resp. rate 16, height 5' 8.5" (1.74 m), weight 101.6 kg (224 lb), last menstrual period 10/09/2015, SpO2 98 %, unknown if currently breastfeeding.  Physical Exam:  General: alert, cooperative and no distress Lochia:normal flow Chest: CTAB Heart: RRR no m/r/g Abdomen: +BS, soft, nontender,  Uterine Fundus: firm, U-2 DVT Evaluation: No evidence of DVT seen on physical exam. Extremities: +2 pedal edema improving   Recent Labs  07/03/16 1932 07/05/16 0521  HGB 13.0 9.7*  HCT 38.9 28.8*    Assessment/Plan:  ASSESSMENT: Kristin Hart is a 36 y.o. G1P1000 [redacted]w[redacted]d s/p PLTCS for non-reassuring fetal status  Pain control, transition to oral today with oxycodone, stop dilaudid pca Baby in NICU still Breast/bottle feeding Contraception: undecided Anemia s/p cs, no dizziness, no further bleeding, EBL in surgery Asthma -slight wheeze, add albuterol prn  LOS: 2 days   Durenda Hurt 07/05/2016, 6:36 AM   CNM attestation Post Partum Day #1 I have seen and examined this patient and agree with above documentation in the resident's note.   Kristin Hart is a 36 y.o. G1P1000 s/p pLTCS.  Pt denies problems with ambulating, voiding or po intake. Pain is well controlled.  Plan for birth control is undecided.  Method of Feeding: pumping. Infant in NICU.  PE:  BP 139/71 (BP Location: Right Arm)   Pulse 83   Temp 98.7 F (37.1 C) (Oral)   Resp 18   Ht 5' 8.5" (1.74 m)    Wt 101.6 kg (224 lb)   LMP 10/09/2015 (Approximate)   SpO2 98%   Breastfeeding? Unknown   BMI 33.56 kg/m  Fundus firm  Plan for discharge: 07/07/16  Cam Hai, CNM 4:23 PM

## 2016-07-05 NOTE — Plan of Care (Signed)
Problem: Pain Managment: Goal: General experience of comfort will improve Outcome: Completed/Met Date Met: 07/05/16 PCA discontinued per MD order. PO medication started. Encouraged patient to increase ambulation today to assist with pain control and to request PRN medication before pain gets too intense.   Problem: Nutritional: Goal: Mothers verbalization of comfort with breastfeeding process will improve Infant in NICU. Patient has DEBP. Encouraged routine pumping to assist with milk production.

## 2016-07-05 NOTE — Plan of Care (Signed)
Problem: Tissue Perfusion: Goal: Risk factors for ineffective tissue perfusion will decrease Outcome: Progressing At 2134 contacted Dr Sydnee Cabal to evaluate bilateral swelling, redness, and tenderness in pt's feet. Dr Sydnee Cabal assessed pt and determined she was all right at this time, with no new interventions needed

## 2016-07-06 NOTE — Progress Notes (Signed)
Post Op Day 2 Subjective:  Kristin Hart is a 36 y.o. G1P1000 [redacted]w[redacted]d s/p pLTCS for nonreassuring fetal status.  No acute events overnight.  Pt denies problems with ambulating, voiding or po intake.  She denies nausea or vomiting.  Pain is well controlled.  She has had flatus. She has not had bowel movement.  Lochia Small.  Plan for birth control is undecided.  Method of Feeding: breast/bottle. Baby in NICU. Mother very worried and stressed about this  Objective: Blood pressure 139/71, pulse 83, temperature 98.7 F (37.1 C), temperature source Oral, resp. rate 18, height 5' 8.5" (1.74 m), weight 101.6 kg (224 lb), last menstrual period 10/09/2015, SpO2 98 %, unknown if currently breastfeeding.  Physical Exam:  General: alert, cooperative and no distress Lochia:normal flow Chest: CTAB Heart: RRR no m/r/g Abdomen: +BS, soft, nontender,  Uterine Fundus: firm, U-1 DVT Evaluation: No evidence of DVT seen on physical exam. Extremities: pedal edema   Recent Labs  07/03/16 1932 07/05/16 0521  HGB 13.0 9.7*  HCT 38.9 28.8*    Assessment/Plan:  ASSESSMENT: Kristin Hart is a 36 y.o. G1P1000 [redacted]w[redacted]d s/p pLTCS  Plan for discharge tomorrow, Breastfeeding and Social Work consult for baby in NICU and maternal support   LOS: 3 days   Durenda Hurt 07/06/2016, 9:25 AM   I have participated in the care of this patient and I agree with the above. Cam Hai 4:24 PM 07/06/2016

## 2016-07-06 NOTE — Lactation Note (Signed)
This note was copied from a baby's chart. Lactation Consultation Note  Patient Name: Kristin Hart [id number] Date: 07/06/2016 Reason for consult: Follow-up assessment   With this first time mom and term NICU baby, now 48 hours old. Mom was crying when I walked in the room, due to the baby not doing well. He is no CPAP and requiring 80 % oxygen, with PPHN. Mom and dad were on the way to visit the baby in NICU, so I told mom I would follow up with her later. I encouraged mom to keep pumping and HE around the clock, every 3 hours, as best as she could.    Maternal Data    Feeding    LATCH Score/Interventions                      Lactation Tools Discussed/Used     Consult Status Consult Status: Follow-up Date: 07/06/16 Follow-up type: In-patient    Alfred Levins 07/06/2016, 11:00 AM

## 2016-07-06 NOTE — Lactation Note (Signed)
This note was copied from a baby's chart. Lactation Consultation Note  Patient Name: Boy Meital Riehl [id number] Date: 07/06/2016 Reason for consult: Follow-up assessment   With this mom of a term NICU baby, with severe respiratory problems. Mom is pumping, and expressing some transitional milk, now 50 hour post partum. She will be discharged to home tomorrow, and will need a 12 day [facility] loaner. Mom is aware she will need $30 cash to loan the pump.    Maternal Data    Feeding    LATCH Score/Interventions                      Lactation Tools Discussed/Used     Consult Status Consult Status: Follow-up Date: 07/07/16 Follow-up type: In-patient    Alfred Levins 07/06/2016, 4:32 PM

## 2016-07-06 NOTE — Progress Notes (Signed)
Patient reports recent upper respiratory infection declined pne-vac. Incentive spirometer given. Lung sounds clear bilaterally.

## 2016-07-06 NOTE — Clinical Social Work Maternal (Signed)
CLINICAL SOCIAL WORK MATERNAL/CHILD NOTE  Patient Details  Name: GLADINE PLUDE MRN: [id number] Date of Birth: [birth year]  Date:  07/06/2016  Clinical Social Worker Initiating Note:  Laurey Arrow Date/ Time Initiated:  07/05/16/1343     Child's Name:  Alphonsa Overall "Quad"   Legal Guardian:  Mother (FOB is Alphonsa Overall III)   Need for Interpreter:  None   Date of Referral:  07/04/16     Reason for Referral:  Behavioral Health Issues, including SI , Current Substance Use/Substance Use During Pregnancy    Referral Source:  NICU   Address:  [address] [place] [address]  Phone number:  [phone]   Household Members:  Self, Significant Other (MOB resides with FOB's parents and FOB's siblings. )   Natural Supports (not living in the home):  Parent   Professional Supports: None   Employment: Unemployed   Type of Work:     Education:  Database administrator Resources:  Medicaid (CSW provided MOB with information to apply for [employer].)   Other Resources:  Food Stamps    Cultural/Religious Considerations Which May Impact Care:  Per [employer], MOB is Engineer, manufacturing.  Strengths:  Ability to meet basic needs , Understanding of illness, Home prepared for child    Risk Factors/Current Problems:  Substance Use , Mental Health Concerns  (hx of THC and methampetamine use; hx of depression, bipolar, and borderline personality disorder. )   Cognitive State:  Alert , Able to Concentrate , Linear Thinking , Insightful    Mood/Affect:  Happy , Bright , Interested , Anxious , Comfortable    CSW Assessment: CSW met with MOB to complete an assessment for MH hx and SA hx.  When CSW arrived, MOB was returning to her room from visiting with infant in the NICU.  MOB was excited to share infant's progress with FOB and CSW.  With MOB's permission, CSW asked MOB's guest to leave the room in effort for CSW to meet with MOB in private.  MOB's mother  left the room and FOB remained in the room.  MOB provided CSW permission to meet with MOB while FOB was present.  FOB was not attentive during the assessment and appeared drowsy as evident by occasionally drifting off to sleep.  MOB often called his name to participate with CSW and answer questions. During the assessment, MOB was polite, engaged, and receptive to meeting with CSW.  MOB appeared anxious as evidence by pacing the floor while answering CSW's questions.  Throughout the assessment MOB relied on FOB to reassure MOB's answers to CSW.   CSW inquired about MOB's MH hx and MOB acknowledged a hx of borderline personality disorder, depression, and bipolar disorder.  MOB was unable to articulate when MOB was diagnosed with disorders.  However, MOB reported that MOB discontinued all medications and outpatient therapy around 3 years ago. MOB communicated that MOB did not have insurance and was unable to continue to pay for medications and outpatient  therapy.  MOB denies any MH signs and symptoms since discontinuation of medications. MOB denies SI and HI.  MOB reports overall mentally feeling well.  CSW educated MOB about PPD. CSW informed MOB of possible supports and interventions to decrease PPD.  CSW also encouraged MOB to seek medical attention if needed for increased signs and symptoms for PPD.  CSW provided MOB with resources for outpatient therapy and encouraged MOB to make contact if a need arise.  CSW also  provided MOB with a PPD checklist and encouraged MOB to assess herself weekly and to consult with MOB's OB if needed; MOB agreed.   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  MOB reported MOB's last use of methamphetamines was October 2017, and last use of marijuana was 2-3 weeks ago.  CSW thanked MOB for [employer] and shared the hospital's policy and procedures regarding substance use during pregnancy. CSW confirmed that the  infant's UDS was negative, and CSW will continue to monitor infant's CDS.  MOB was made aware, that if infant's CDS is positive without an explanation, CSW would make a report to [place]. MOB did not have any questions at this time.  CSW offered MOB SA resources and referrals; MOB declined them.   CSW thanked MOB for meeting with CSW and provided MOB with CSW's contact information.  CSW will continue to assess MOB for psychosocial needs, concerns, and barriers while infant remains in the NICU.  CSW Plan/Description:  Information/Referral to [employer] , Psychosocial Support and Ongoing Assessment of Needs, Patient/Family Education  (CSW will monitor infant's CDS and will make a report to CPS if warranted. )   Laurey Arrow, MSW, LCSW Clinical Social Work [phone]  Dimple Nanas, LCSW 07/06/2016, 8:56 AM

## 2016-07-06 NOTE — Progress Notes (Signed)
Kristin Hart was feeling very restless and emotional today.  She reported that Quad was not doing well and that they continue to give him platelets.  She was tearful and said that she had been awake since 3:30 am.  She was going to try to rest now as she is hoping to be able to try holding him tonight.  She asked for continued prayer.   [address] Dyanne Carrel, Bcc Pager, [phone] 2:24 PM    07/06/16 1400  Clinical Encounter Type  Visited With Patient  Visit Type Spiritual support

## 2016-07-07 MED ORDER — OXYCODONE HCL 5 MG PO TABS: 5.0000 mg | ORAL_TABLET | ORAL | 0 refills | Status: DC | PRN

## 2016-07-07 MED ORDER — IBUPROFEN 600 MG PO TABS: 600.0000 mg | ORAL_TABLET | Freq: Four times a day (QID) | ORAL | 0 refills | Status: DC | PRN

## 2016-07-07 NOTE — Discharge Summary (Signed)
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 yo G1P1000 at [redacted]w[redacted]d was admitted to the hospital 07/03/2016 for induction of labor due to [facility]. Patient had a labor course significant for becoming approx 6cm and then having fetal bradycardia. The patient went for cesarean section due to Non-Reassuring FHR, and delivered a Viable infant,@BABYSUPPRESS (DBLINK,ept,110,,1,,) Membrane Rupture Time/Date: )3:32 AM ,07/04/2016    of operation can be found in separate operative Note.  Patient had an uncomplicated postpartum course. She is ambulating, tolerating a regular diet, passing flatus, and urinating well.  Patient is discharged home in stable condition on 07/07/16.                                    Physical exam  Vitals:   07/05/16 1759 07/06/16 0615 07/06/16 1915 07/07/16 0500  BP: 128/85 139/71 133/69 127/73  Pulse: 92 83 70 71  Resp: Temp: 98.2 F (36.8 C) 98.7 F (37.1 C) 98.2 F (36.8 C) 98.3 F (36.8 C)  TempSrc: Oral Oral Oral Oral  SpO2:      Weight:      Height:       General: alert and cooperative Lochia: appropriate Uterine Fundus: firm Incision: Dressing is clean, dry, and  intact DVT Evaluation: No evidence of DVT seen on physical exam. Labs: Lab Results  Component Value Date   WBC 14.0 (H) 07/05/2016   HGB 9.7 (L) 07/05/2016   HCT 28.8 (L) 07/05/2016   MCV 92.3 07/05/2016   PLT 179 07/05/2016   CMP Latest Ref Rng & Units 07/03/2016  Glucose 65 - 99 mg/dL 409(W)  BUN 6 - 20 mg/dL 19  Creatinine 1.19 - 1.47 mg/dL 8.29  Sodium 562 - 130 mmol/L 136  Potassium 3.5 - 5.1 mmol/L 4.1  Chloride 101 - 111 mmol/L 104  CO2 22 - 32 mmol/L 23  Calcium 8.9 - 10.3 mg/dL 9.4  Total Protein 6.5 - 8.1 g/dL 6.0(L)  Total Bilirubin 0.3 - 1.2 mg/dL 0.5  Alkaline Phos 38 - 126 U/L 88  AST 15 - 41 U/L 24  ALT 14 - 54 U/L 16    Discharge instruction: per After Visit Summary and "Baby and Me  Booklet".  After visit meds:  Allergies as of 07/07/2016   No Known Allergies     Medication List    STOP taking these medications   acetaminophen 500 MG tablet Commonly known as:  TYLENOL   cetirizine 10 MG tablet Commonly known as:  ZYRTEC   dextromethorphan 30 MG/5ML liquid Commonly known as:  DELSYM   glyBURIDE 2.5 MG tablet Commonly known as:  DIABETA   metFORMIN 1000 MG tablet Commonly known as:  GLUCOPHAGE     TAKE these medications   albuterol 108 (90 Base) MCG/ACT inhaler Commonly known as:  PROVENTIL HFA;VENTOLIN HFA Inhale 2 puffs into the lungs every 6 (six) hours as needed for wheezing or shortness of breath.   ibuprofen 600 MG tablet Commonly known as:  ADVIL,MOTRIN Take 1 tablet (600 mg total) by mouth every 6 (six) hours as needed.   OB COMPLETE PETITE 35-5-1-200 MG Caps Take 1 tablet by mouth daily.   oxyCODONE 5 MG immediate release tablet Commonly known as:  Oxy IR/ROXICODONE Take 1 tablet (5 mg total) by mouth every 4 (four) hours as needed for moderate pain.   Vitamin D (Ergocalciferol) 50000 units Caps capsule Commonly known as:  DRISDOL Take 1 capsule (50,000 Units total) by mouth every 7 (seven) days. What changed:  additional  instructions       Diet: routine diet  Activity: Advance as tolerated. Pelvic rest for 6 weeks.   Outpatient follow up:6 weeks Follow up Appt:Future Appointments Date Time Provider Department Center  08/15/2016 1:20 PM Lorne Skeens, MD [facility]   Follow up Visit:No Follow-up on file.  Postpartum contraception: Undecided  Newborn Data: Live born female  Birth Weight: 7 lb 3.3 oz (3270 g) APGAR: 6, 7  Baby Feeding: pumping for NICU infant Disposition:NICU   07/07/2016 Cam Hai, CNM  9:38 AM

## 2016-07-07 NOTE — Plan of Care (Signed)
Problem: Coping: Goal: Ability to cope will improve Outcome: Progressing Pt expressed feelings of sadness and fustration when informed that she could not yet hold her baby in NICU, especially in light of being discharged tommorow. I asked if patient wanted another visit from spiritual care, which she declined at this time. I offered her emotional support and reminded her of the support resources she has available should she feels she needs them

## 2016-07-07 NOTE — Lactation Note (Signed)
This note was copied from a baby's chart. Lactation Consultation Note:  Mother reports that she is pumping every 3 hours for 15 mins. Mother reports that she is only getting small amts. Mother is not active with WIC . She was offered a [facility] loaner pump,but mother states she doesn't have the $30.00. Mother was given a hand pump with instructions and an extra #27 flange. Mother was also given yellow colostrum dots and advised to ask NICU for more breastmilk labels. Mother reports that she is going to go to [facility] on Monday. Encouraged mother to bond with infant as much as possible with touching and talking to infant. Suggested taking a blanket / tee shirt home that smells like infant to use when pumping. Discussed treatment to prevent severe engorgement. Mother advised to pump every 2-3 hours for 15-20 mins. Mother advised to phone for [facility] as needed and to seek out [facility] in NICU.  Patient Name: Kristin Hart ZOXWR'U Date: 07/07/2016     Maternal Data    Feeding    LATCH Score/Interventions                      Lactation Tools Discussed/Used     Consult Status      Kristin Hart 07/07/2016, 2:59 PM

## 2016-07-07 NOTE — Lactation Note (Signed)
This note was copied from a baby's chart. Lactation Consultation Note; I made a visit to mothers room , but she was in the NICU. I will follow up this afternoon.  Patient Name: Kristin Hart ZOXWR'U Date: 07/07/2016     Maternal Data    Feeding    LATCH Score/Interventions                      Lactation Tools Discussed/Used     Consult Status      Michel Bickers 07/07/2016, 1:04 PM

## 2016-07-07 NOTE — Plan of Care (Signed)
Problem: Coping: Goal: Ability to identify and utilize available resources and services will improve Outcome: Completed/Met Date Met: 07/07/16 Baby in NICU in critical condition and intubated today. Mother tearful and emotional. Emotional support and time allowed for mother and father to visit with baby prior to discharge. Mother in better spirits and awaiting ride for home.

## 2016-07-08 ENCOUNTER — Inpatient Hospital Stay (HOSPITAL_COMMUNITY): Admission: RE | Admit: 2016-07-08 | Payer: Medicaid Other | Source: Ambulatory Visit

## 2016-07-25 ENCOUNTER — Encounter (HOSPITAL_COMMUNITY): Payer: Self-pay | Admitting: *Deleted

## 2016-07-25 ENCOUNTER — Emergency Department (HOSPITAL_COMMUNITY)
Admission: EM | Admit: 2016-07-25 | Discharge: 2016-07-26 | Disposition: A | Discharge status: Home / Self Care | Payer: Medicaid Other | Attending: Emergency Medicine | Admitting: Emergency Medicine

## 2016-07-25 DIAGNOSIS — R51 Headache: Secondary | ICD-10-CM | POA: Diagnosis not present

## 2016-07-25 DIAGNOSIS — Z5321 Procedure and treatment not carried out due to patient leaving prior to being seen by health care provider: Secondary | ICD-10-CM | POA: Diagnosis not present

## 2016-07-25 LAB — CBC
HCT: 40.4 % (ref 36.0–46.0)
Hemoglobin: 12.8 g/dL (ref 12.0–15.0)
MCH: 28.9 pg (ref 26.0–34.0)
MCHC: 31.7 g/dL (ref 30.0–36.0)
MCV: 91.2 fL (ref 78.0–100.0)
Platelets: 344 10*3/uL (ref 150–400)
RBC: 4.43 MIL/uL (ref 3.87–5.11)
RDW: 12.6 % (ref 11.5–15.5)
WBC: 9.7 10*3/uL (ref 4.0–10.5)

## 2016-07-25 LAB — COMPREHENSIVE METABOLIC PANEL
ALT: 27 U/L (ref 14–54)
AST: 31 U/L (ref 15–41)
Albumin: 3.8 g/dL (ref 3.5–5.0)
Alkaline Phosphatase: 86 U/L (ref 38–126)
Anion gap: 12 (ref 5–15)
BUN: 13 mg/dL (ref 6–20)
CO2: 21 mmol/L — ABNORMAL LOW (ref 22–32)
Calcium: 9.1 mg/dL (ref 8.9–10.3)
Chloride: 105 mmol/L (ref 101–111)
Creatinine, Ser: 0.85 mg/dL (ref 0.44–1.00)
GFR calc Af Amer: >60 mL/min (ref >60)
GFR calc non Af Amer: >60 mL/min (ref >60)
Glucose, Bld: 191 mg/dL — ABNORMAL HIGH (ref 65–99)
Potassium: 3.4 mmol/L — ABNORMAL LOW (ref 3.5–5.1)
Sodium: 138 mmol/L (ref 135–145)
Total Bilirubin: 0.6 mg/dL (ref 0.3–1.2)
Total Protein: 6.8 g/dL (ref 6.5–8.1)

## 2016-07-25 NOTE — ED Triage Notes (Signed)
Pt states, "I have an excruciating pain in my left skully and my jaws hurt." pt c/o L eye blurred vision, pt denies n/v, reports liquid stools with black stools, pt had recent C section, pt reports increased stress, A&O x4

## 2016-07-25 NOTE — ED Notes (Signed)
Called pt to assess vitals had no answer.

## 2016-07-25 NOTE — ED Notes (Signed)
Pt came to NF and stated that she was leaving and walked out.

## 2016-08-15 ENCOUNTER — Encounter: Payer: Self-pay | Admitting: Family Medicine

## 2016-08-15 ENCOUNTER — Ambulatory Visit: Payer: Self-pay | Admitting: Obstetrics and Gynecology

## 2016-08-21 ENCOUNTER — Encounter: Payer: Self-pay | Admitting: Advanced Practice Midwife

## 2016-08-21 ENCOUNTER — Ambulatory Visit (INDEPENDENT_AMBULATORY_CARE_PROVIDER_SITE_OTHER): Payer: Medicaid Other | Admitting: Advanced Practice Midwife

## 2016-08-21 VITALS — BP 120/51 | HR 67 | Temp 97.8°F | Wt 180.9 lb

## 2016-08-21 DIAGNOSIS — Z3042 Encounter for surveillance of injectable contraceptive: Secondary | ICD-10-CM | POA: Diagnosis not present

## 2016-08-21 DIAGNOSIS — J4520 Mild intermittent asthma, uncomplicated: Secondary | ICD-10-CM

## 2016-08-21 DIAGNOSIS — J069 Acute upper respiratory infection, unspecified: Secondary | ICD-10-CM

## 2016-08-21 DIAGNOSIS — Z3202 Encounter for pregnancy test, result negative: Secondary | ICD-10-CM

## 2016-08-21 DIAGNOSIS — Z308 Encounter for other contraceptive management: Secondary | ICD-10-CM

## 2016-08-21 MED ORDER — GUAIFENESIN ER 600 MG PO TB12: 600.0000 mg | ORAL_TABLET | Freq: Two times a day (BID) | ORAL | 0 refills | Status: DC

## 2016-08-21 MED ORDER — ALBUTEROL SULFATE HFA 108 (90 BASE) MCG/ACT IN AERS: 2.0000 | INHALATION_SPRAY | Freq: Four times a day (QID) | RESPIRATORY_TRACT | 2 refills | Status: DC | PRN

## 2016-08-21 MED ORDER — MEDROXYPROGESTERONE ACETATE 150 MG/ML IM SUSP
150.0000 mg | Freq: Once | INTRAMUSCULAR | Status: AC
  Administered 2016-08-21: 150 mg via INTRAMUSCULAR

## 2016-08-21 NOTE — Progress Notes (Signed)
Subjective:     Kristin Hart is a 36 y.o. female who presents for a postpartum visit. She is 7 weeks postpartum following a low cervical transverse Cesarean section. I have fully reviewed the prenatal and intrapartum course. The delivery was at 38 gestational weeks. Outcome: primary cesarean section, low transverse incision. Anesthesia: spinal. Postpartum course has been unremarkable. Patient currently feels ill, sore throat, cough and chest pain. Baby's course has been remarkable for NICU stay and [facility] due to digestive problems. He is now home with mom. Baby is feeding by bottle - Similac Advance. Bleeding no bleeding. Bowel function is normal. Bladder function is normal. Patient is not sexually active. Contraception method is none but wants depo provera. Postpartum depression screening: negative.  Baby was in [facility]ICU and then in [facility] but is now doing better and home.    Has developed an upper respiratory infection with congestion and chest tightness. Needs a new inhaler  Wants to use DepoProvera Has used it before and liked it.  The following portions of the patient's history were reviewed and updated as appropriate: allergies, current medications, past family history, past medical history, past social history, past surgical history and problem list.  Review of Systems Pertinent items are noted in HPI.   Objective:    There were no vitals taken for this visit.  General:  alert, cooperative and no distress   Breasts:  inspection negative, no nipple discharge or bleeding, no masses or nodularity palpable  Lungs: clear to auscultation bilaterally  Heart:  regular rate and rhythm, S1, S2 normal, no murmur, click, rub or gallop  Abdomen: soft, non-tender; bowel sounds normal; no masses,  no organomegaly   Vulva:  not evaluated  Vagina: not evaluated  Cervix:  n/a  Corpus: not examined  Adnexa:  not evaluated  Rectal Exam: Not performed.         Assessment:     Normal postpartum exam. Pap smear not done at today's visit.   Plan:    1. Contraception: Depo-Provera injections   Discussed risk of bone loss after 2 years sustained use. 2. Rx albuterol inhaler, Mucinex  For URI.  Warned to report symptoms that last longer than a week 3. Follow up in: 3 months or as needed.   4.    Return for glucose challenge due to hx pregnancy diabetes

## 2016-08-21 NOTE — Patient Instructions (Addendum)
Medroxyprogesterone injection [Contraceptive] What is this medicine? MEDROXYPROGESTERONE (me DROX ee proe JES te rone) contraceptive injections prevent pregnancy. They provide effective birth control for 3 months. Depo-subQ Provera 104 is also used for treating pain related to endometriosis. This medicine may be used for other purposes; ask your health care provider or pharmacist if you have questions. COMMON BRAND NAME(S): Depo-Provera, Depo-subQ Provera 104 What should I tell my health care provider before I take this medicine? They need to know if you have any of these conditions: -frequently drink alcohol -asthma -blood vessel disease or a history of a blood clot in the lungs or legs -bone disease such as osteoporosis -breast cancer -diabetes -eating disorder (anorexia nervosa or bulimia) -high blood pressure -HIV infection or AIDS -kidney disease -liver disease -mental depression -migraine -seizures (convulsions) -stroke -tobacco smoker -vaginal bleeding -an unusual or allergic reaction to medroxyprogesterone, other hormones, medicines, foods, dyes, or preservatives -pregnant or trying to get pregnant -breast-feeding How should I use this medicine? Depo-Provera Contraceptive injection is given into a muscle. Depo-subQ Provera 104 injection is given under the skin. These injections are given by a health care professional. You must not be pregnant before getting an injection. The injection is usually given during the first 5 days after the start of a menstrual period or 6 weeks after delivery of a baby. Talk to your pediatrician regarding the use of this medicine in children. Special care may be needed. These injections have been used in female children who have started having menstrual periods. Overdosage: If you think you have taken too much of this medicine contact a poison control center or emergency room at once. NOTE: This medicine is only for you. Do not share this medicine  with others. What if I miss a dose? Try not to miss a dose. You must get an injection once every 3 months to maintain birth control. If you cannot keep an appointment, call and reschedule it. If you wait longer than 13 weeks between Depo-Provera contraceptive injections or longer than 14 weeks between Depo-subQ Provera 104 injections, you could get pregnant. Use another method for birth control if you miss your appointment. You may also need a pregnancy test before receiving another injection. What may interact with this medicine? Do not take this medicine with any of the following medications: -bosentan This medicine may also interact with the following medications: -aminoglutethimide -antibiotics or medicines for infections, especially rifampin, rifabutin, rifapentine, and griseofulvin -aprepitant -barbiturate medicines such as phenobarbital or primidone -bexarotene -carbamazepine -medicines for seizures like ethotoin, felbamate, oxcarbazepine, phenytoin, topiramate -modafinil -St. John's wort This list may not describe all possible interactions. Give your health care provider a list of all the medicines, herbs, non-prescription drugs, or dietary supplements you use. Also tell them if you smoke, drink alcohol, or use illegal drugs. Some items may interact with your medicine. What should I watch for while using this medicine? This drug does not protect you against HIV infection (AIDS) or other sexually transmitted diseases. Use of this product may cause you to lose calcium from your bones. Loss of calcium may cause weak bones (osteoporosis). Only use this product for more than 2 years if other forms of birth control are not right for you. The longer you use this product for birth control the more likely you will be at risk for weak bones. Ask your health care professional how you can keep strong bones. You may have a change in bleeding pattern or irregular periods. Many females stop having    periods while taking this drug. If you have received your injections on time, your chance of being pregnant is very low. If you think you may be pregnant, see your health care professional as soon as possible. Tell your health care professional if you want to get pregnant within the next year. The effect of this medicine may last a long time after you get your last injection. What side effects may I notice from receiving this medicine? Side effects that you should report to your doctor or health care professional as soon as possible: -allergic reactions like skin rash, itching or hives, swelling of the face, lips, or tongue -breast tenderness or discharge -breathing problems -changes in vision -depression -feeling faint or lightheaded, falls -fever -pain in the abdomen, chest, groin, or leg -problems with balance, talking, walking -unusually weak or tired -yellowing of the eyes or skin Side effects that usually do not require medical attention (report to your doctor or health care professional if they continue or are bothersome): -acne -fluid retention and swelling -headache -irregular periods, spotting, or absent periods -temporary pain, itching, or skin reaction at site where injected -weight gain This list may not describe all possible side effects. Call your doctor for medical advice about side effects. You may report side effects to FDA at 1-800-FDA-1088. Where should I keep my medicine? This does not apply. The injection will be given to you by a health care professional. NOTE: This sheet is a summary. It may not cover all possible information. If you have questions about this medicine, talk to your doctor, pharmacist, or health care provider.  2018 Elsevier/Gold Standard (2008-03-19 18:37:56)  Upper Respiratory Infection, Adult Most upper respiratory infections (URIs) are a viral infection of the air passages leading to the lungs. A URI affects the nose, throat, and upper air  passages. The most common type of URI is nasopharyngitis and is typically referred to as "the common cold." URIs run their course and usually go away on their own. Most of the time, a URI does not require medical attention, but sometimes a bacterial infection in the upper airways can follow a viral infection. This is called a secondary infection. Sinus and middle ear infections are common types of secondary upper respiratory infections. Bacterial pneumonia can also complicate a URI. A URI can worsen asthma and chronic obstructive pulmonary disease (COPD). Sometimes, these complications can require emergency medical care and may be life threatening. What are the causes? Almost all URIs are caused by viruses. A virus is a type of germ and can spread from one person to another. What increases the risk? You may be at risk for a URI if:  You smoke.  You have chronic heart or lung disease.  You have a weakened defense (immune) system.  You are very young or very old.  You have nasal allergies or asthma.  You work in crowded or poorly ventilated areas.  You work in health care facilities or schools.  What are the signs or symptoms? Symptoms typically develop 2-3 days after you come in contact with a cold virus. Most viral URIs last 7-10 days. However, viral URIs from the influenza virus (flu virus) can last 14-18 days and are typically more severe. Symptoms may include:  Runny or stuffy (congested) nose.  Sneezing.  Cough.  Sore throat.  Headache.  Fatigue.  Fever.  Loss of appetite.  Pain in your forehead, behind your eyes, and over your cheekbones (sinus pain).  Muscle aches.  How is this diagnosed?  Your health care provider may diagnose a URI by:  Physical exam.  Tests to check that your symptoms are not due to another condition such as: ? Strep throat. ? Sinusitis. ? Pneumonia. ? Asthma.  How is this treated? A URI goes away on its own with time. It cannot be  cured with medicines, but medicines may be prescribed or recommended to relieve symptoms. Medicines may help:  Reduce your fever.  Reduce your cough.  Relieve nasal congestion.  Follow these instructions at home:  Take medicines only as directed by your health care provider.  Gargle warm saltwater or take cough drops to comfort your throat as directed by your health care provider.  Use a warm mist humidifier or inhale steam from a shower to increase air moisture. This may make it easier to breathe.  Drink enough fluid to keep your urine clear or pale yellow.  Eat soups and other clear broths and maintain good nutrition.  Rest as needed.  Return to work when your temperature has returned to normal or as your health care provider advises. You may need to stay home longer to avoid infecting others. You can also use a face mask and careful hand washing to prevent spread of the virus.  Increase the usage of your inhaler if you have asthma.  Do not use any tobacco products, including cigarettes, chewing tobacco, or electronic cigarettes. If you need help quitting, ask your health care provider. How is this prevented? The best way to protect yourself from getting a cold is to practice good hygiene.  Avoid oral or hand contact with people with cold symptoms.  Wash your hands often if contact occurs.  There is no clear evidence that vitamin C, vitamin E, echinacea, or exercise reduces the chance of developing a cold. However, it is always recommended to get plenty of rest, exercise, and practice good nutrition. Contact a health care provider if:  You are getting worse rather than better.  Your symptoms are not controlled by medicine.  You have chills.  You have worsening shortness of breath.  You have brown or red mucus.  You have yellow or brown nasal discharge.  You have pain in your face, especially when you bend forward.  You have a fever.  You have swollen neck  glands.  You have pain while swallowing.  You have white areas in the back of your throat. Get help right away if:  You have severe or persistent: ? Headache. ? Ear pain. ? Sinus pain. ? Chest pain.  You have chronic lung disease and any of the following: ? Wheezing. ? Prolonged cough. ? Coughing up blood. ? A change in your usual mucus.  You have a stiff neck.  You have changes in your: ? Vision. ? Hearing. ? Thinking. ? Mood. This information is not intended to replace advice given to you by your health care provider. Make sure you discuss any questions you have with your health care provider. Document Released: 08/22/2000 Document Revised: 10/30/2015 Document Reviewed: 06/03/2013 Elsevier Interactive Patient Education  2017 [employer].

## 2016-08-22 LAB — POCT PREGNANCY, URINE: Preg Test, Ur: NEGATIVE

## 2016-08-29 ENCOUNTER — Other Ambulatory Visit: Payer: Self-pay

## 2016-12-07 ENCOUNTER — Ambulatory Visit (INDEPENDENT_AMBULATORY_CARE_PROVIDER_SITE_OTHER): Payer: Medicaid Other | Admitting: General Practice

## 2016-12-07 VITALS — BP 125/74 | HR 54 | Ht 67.0 in | Wt 164.5 lb

## 2016-12-07 DIAGNOSIS — Z3042 Encounter for surveillance of injectable contraceptive: Secondary | ICD-10-CM | POA: Diagnosis not present

## 2016-12-07 DIAGNOSIS — Z3202 Encounter for pregnancy test, result negative: Secondary | ICD-10-CM

## 2016-12-07 LAB — POCT PREGNANCY, URINE: Preg Test, Ur: NEGATIVE

## 2016-12-07 MED ORDER — MEDROXYPROGESTERONE ACETATE 150 MG/ML IM SUSP
150.0000 mg | Freq: Once | INTRAMUSCULAR | Status: AC
  Administered 2016-12-07: 150 mg via INTRAMUSCULAR

## 2016-12-07 NOTE — Progress Notes (Signed)
Patient is two weeks late for depo. Patient reports last intercourse two weeks ago. Obtained negative upt & advised patient to use condoms over the next two weeks.

## 2017-02-22 ENCOUNTER — Ambulatory Visit (INDEPENDENT_AMBULATORY_CARE_PROVIDER_SITE_OTHER): Payer: Medicaid Other | Admitting: *Deleted

## 2017-02-22 VITALS — BP 109/65 | HR 65

## 2017-02-22 DIAGNOSIS — Z3042 Encounter for surveillance of injectable contraceptive: Secondary | ICD-10-CM | POA: Diagnosis not present

## 2017-02-22 DIAGNOSIS — J069 Acute upper respiratory infection, unspecified: Secondary | ICD-10-CM

## 2017-02-22 DIAGNOSIS — J4520 Mild intermittent asthma, uncomplicated: Secondary | ICD-10-CM

## 2017-02-22 MED ORDER — ALBUTEROL SULFATE HFA 108 (90 BASE) MCG/ACT IN AERS: 2.0000 | INHALATION_SPRAY | Freq: Four times a day (QID) | RESPIRATORY_TRACT | 0 refills | Status: DC | PRN

## 2017-02-22 MED ORDER — MEDROXYPROGESTERONE ACETATE 150 MG/ML IM SUSP
150.0000 mg | Freq: Once | INTRAMUSCULAR | Status: AC
  Administered 2017-02-22: 150 mg via INTRAMUSCULAR

## 2017-02-22 NOTE — Progress Notes (Signed)
Patient presents to clinic for depo injection. Tolerated well. Patient stated she has not been able to find a PCP and is out of her albuterol inhaler. Requests a refill if possible. Spoke with Vonzella NippleJulie Wenzel PA who okayed one new inhaler with no refills. Patient needs to get PCP for any more refills. I explained this to patient who voiced understanding. Info given for [facility]. Patient agreed to call them.

## 2017-02-22 NOTE — Progress Notes (Signed)
Agree with nursing staff's documentation of this patient's clinic encounter.  Macky Galik, PA-C 02/22/2017 12:01 PM    

## 2017-05-10 ENCOUNTER — Ambulatory Visit: Payer: Self-pay

## 2017-05-14 ENCOUNTER — Ambulatory Visit (INDEPENDENT_AMBULATORY_CARE_PROVIDER_SITE_OTHER): Payer: Medicaid Other | Admitting: General Practice

## 2017-05-14 ENCOUNTER — Ambulatory Visit (INDEPENDENT_AMBULATORY_CARE_PROVIDER_SITE_OTHER): Payer: Self-pay | Admitting: Clinical

## 2017-05-14 VITALS — BP 140/90 | HR 97 | Ht 66.0 in | Wt 142.0 lb

## 2017-05-14 DIAGNOSIS — Z3042 Encounter for surveillance of injectable contraceptive: Secondary | ICD-10-CM

## 2017-05-14 DIAGNOSIS — Z658 Other specified problems related to psychosocial circumstances: Secondary | ICD-10-CM

## 2017-05-14 DIAGNOSIS — Z8639 Personal history of other endocrine, nutritional and metabolic disease: Secondary | ICD-10-CM

## 2017-05-14 MED ORDER — MEDROXYPROGESTERONE ACETATE 150 MG/ML IM SUSP
150.0000 mg | Freq: Once | INTRAMUSCULAR | Status: AC
  Administered 2017-05-14: 150 mg via INTRAMUSCULAR

## 2017-05-14 NOTE — Progress Notes (Signed)
Kristin BeetsAshley R Hart here for Depo-Provera  Injection.  Injection administered without complication. Patient will return in 3 months for next injection. Patient reports recently feeling stressed and maybe that was related to her 20 lb weight loss. Patient reports difficulty obtaining a PCP appt somewhere. [facility]. Will also try to refer patient for primary care   Marylynn PearsonCarrie Jerrit Horen, RN 05/14/2017  3:04 PM

## 2017-05-14 NOTE — BH Specialist Note (Signed)
Integrated Behavioral Health Initial Visit  MRN: [id number] Name: Kristin Hart  Number of Integrated Behavioral Health Clinician visits:: 1/6  4 total Session Start time: 3:05  Session End time: 3:25 Total time: 20 minutes  Type of Service: Integrated Behavioral Health- Individual/Family Interpretor:No. Interpretor Name and Language: n/a   Warm Hand Off Completed.       SUBJECTIVE: Kristin Hart is a 37 y.o. female accompanied by n/a Patient was referred by Dr Earlene Plateravis for psychosocial; symptoms of anxiety and depression. Patient reports the following symptoms/concerns: Pt states her primary concern today is insufficient food, lack of transportation, and a need for more stable housing; pt says knowing her infant son is happy and well-adjusted, in spite of family difficulties, helps her to cope the most.  Duration of problem: Increase in over one month; Severity of problem: moderate  OBJECTIVE: Mood: Hopeless and Affect: Tearful Risk of harm to self or others: No plan to harm self or others  LIFE CONTEXT: Family and Social: Pt lives with her 1yo son, boyfriend, and boyfriend's mother; her boyfriend is her greatest support School/Work: Boyfriend currently seeking work Self-Care: Recognizing a need for greater self-care in the midst of survival mode Life Changes: Housing instability; food instability; lack of transportation  GOALS ADDRESSED: Patient will: 1. Reduce symptoms of: stress 2. Increase knowledge and/or ability of: using community resources   3. Demonstrate ability to: Increase healthy adjustment to current life circumstances  INTERVENTIONS: Interventions utilized: Solution-Focused Strategies and Link to [facility]  Standardized Assessments completed: GAD-7 and PHQ 9  ASSESSMENT: Patient currently experiencing Psychosocial stressors.   Patient may benefit from brief therapeutic intervention to cope with psychosocial stress.  PLAN: 1. Follow up with  behavioral health clinician on : As needed 2. Behavioral recommendations:  -Obtain reduced-fare bus ID (to use for food banks, housing hub and womens resource center) -Go to [employer] [facility] to apply for [facility]; any other qualifying housing available -Call [facility] for  resource counseling appointment (additional food and other referrals) 3. Referral(s): Integrated Art gallery managerBehavioral Health Services (In Clinic) and [facility] Resources:  Arts administratorood, Housing and Transportation 4. "From scale of 1-10, how likely are you to follow plan?": 10  Rae LipsJamie C McMannes, LCSW  Depression screen [facility] 2/9 08/21/2016 07/03/2016 06/22/2016 06/11/2016 06/07/2016  Decreased Interest 0 0 0 0 0  Down, Depressed, Hopeless 1 0 0 0 0  PHQ - 2 Score 1 0 0 0 0  Altered sleeping 1 1 0 0 0  Tired, decreased energy 1 1 0 0 0  Change in appetite 0 0 0 0 0  Feeling bad or failure about yourself  1 0 0 0 0  Trouble concentrating 0 0 0 0 0  Moving slowly or fidgety/restless 1 0 0 0 0  Suicidal thoughts 0 0 0 0 0  PHQ-9 Score 5 2 0 0 0   GAD 7 : Generalized Anxiety Score 08/21/2016 07/03/2016 06/22/2016 06/11/2016  Nervous, Anxious, on Edge 1 1 1 1   Control/stop worrying 1 1 1 1   Worry too much - different things 1 0 0 0  Trouble relaxing 1 1 1  0  Restless 0 1 1 0  Easily annoyed or irritable 1 0 1 0  Afraid - awful might happen 0 0 0 0  Total GAD 7 Score 5 4 5  2

## 2017-05-16 ENCOUNTER — Encounter (HOSPITAL_COMMUNITY): Payer: Self-pay | Admitting: Emergency Medicine

## 2017-05-16 ENCOUNTER — Emergency Department (HOSPITAL_COMMUNITY)
Admission: EM | Admit: 2017-05-16 | Discharge: 2017-05-16 | Disposition: A | Discharge status: Home / Self Care | Payer: Medicaid Other | Attending: Emergency Medicine | Admitting: Emergency Medicine

## 2017-05-16 DIAGNOSIS — Z79899 Other long term (current) drug therapy: Secondary | ICD-10-CM | POA: Diagnosis not present

## 2017-05-16 DIAGNOSIS — E119 Type 2 diabetes mellitus without complications: Secondary | ICD-10-CM | POA: Insufficient documentation

## 2017-05-16 DIAGNOSIS — K029 Dental caries, unspecified: Secondary | ICD-10-CM | POA: Insufficient documentation

## 2017-05-16 DIAGNOSIS — K0889 Other specified disorders of teeth and supporting structures: Secondary | ICD-10-CM | POA: Diagnosis present

## 2017-05-16 DIAGNOSIS — K047 Periapical abscess without sinus: Secondary | ICD-10-CM | POA: Insufficient documentation

## 2017-05-16 DIAGNOSIS — E039 Hypothyroidism, unspecified: Secondary | ICD-10-CM | POA: Insufficient documentation

## 2017-05-16 DIAGNOSIS — J45909 Unspecified asthma, uncomplicated: Secondary | ICD-10-CM | POA: Diagnosis not present

## 2017-05-16 MED ORDER — AMOXICILLIN 500 MG PO CAPS: 500.0000 mg | ORAL_CAPSULE | Freq: Three times a day (TID) | ORAL | 0 refills | Status: DC

## 2017-05-16 MED ORDER — NAPROXEN 375 MG PO TABS: 375.0000 mg | ORAL_TABLET | Freq: Two times a day (BID) | ORAL | 0 refills | Status: DC

## 2017-05-16 NOTE — ED Triage Notes (Signed)
Pt states that she believes she has developed a lower left molar abscessed tooth. She reports pain for a few months but believes the abscess formed over the last 2 days.

## 2017-05-16 NOTE — ED Provider Notes (Signed)
[facility] Provider Note   CSN: [id number] Arrival date & time: 05/16/17  1643     History   Chief Complaint Chief Complaint  Patient presents with  . Dental Pain    HPI Kristin Hart is a 37 y.o. female who presents to the ED with dental pain. Patient reports she has had the pain in the left lower dental area off and on for the past few months but it has increased over the last 2 days and she believes is abscessed.  Patient denies fever or chills  HPI  Past Medical History:  Diagnosis Date  . Asthma   . 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 Diabetes mellitus without complication (HCC)    type 2  . Hypothyroidism   . Psychiatric disorder   . Stomach ulcer     Patient Active Problem List   Diagnosis Date Noted  . Non-reassuring fetal heart rate or rhythm affecting management of mother 07/03/2016  . Depression affecting pregnancy in third trimester, antepartum 06/07/2016  . Marijuana use 06/07/2016  . Low vitamin D level 03/15/2016  . Supervision of normal first pregnancy 02/22/2016  . Diabetes mellitus affecting pregnancy 02/22/2016  . Hypothyroidism affecting pregnancy 02/22/2016  . Asthma affecting pregnancy, antepartum 02/22/2016  . Mental health disorder 02/22/2016  . 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 CESAREAN SECTION N/A 07/04/2016   Procedure: CESAREAN SECTION;  Surgeon: Levie HeritageJacob J Stinson, DO;  [facility];  Service: Obstetrics;  Laterality: N/A;  . NO PAST SURGERIES      OB History    Gravida Para Term Preterm AB Living   1 1 1          SAB TAB Ectopic Multiple Live Births         0         Home Medications    Prior to Admission medications   Medication Sig Start Date End Date Taking? Authorizing Provider  albuterol (PROVENTIL HFA;VENTOLIN HFA) 108 (90 Base) MCG/ACT inhaler Inhale 2  puffs into the lungs every 6 (six) hours as needed for wheezing or shortness of breath. 02/22/17   Marny LowensteinWenzel, Julie N, PA-C  amoxicillin (AMOXIL) 500 MG capsule Take 1 capsule (500 mg total) by mouth 3 (three) times daily. 05/16/17   Janne NapoleonNeese, Hope M, NP  cetirizine (ZYRTEC) 10 MG tablet Take 10 mg by mouth daily. 06/29/16   [provider]  guaiFENesin (MUCINEX) 600 MG 12 hr tablet Take 1 tablet (600 mg total) by mouth 2 (two) times daily. 08/21/16   Aviva SignsWilliams, Marie L, CNM  ibuprofen (ADVIL,MOTRIN) 600 MG tablet Take 1 tablet (600 mg total) by mouth every 6 (six) hours as needed. 07/07/16   Arabella MerlesShaw, Kimberly D, CNM  naproxen (NAPROSYN) 375 MG tablet Take 1 tablet (375 mg total) by mouth 2 (two) times daily. 05/16/17   Janne NapoleonNeese, Hope M, NP  Prenat-FeCbn-FeAspGl-FA-Omega (OB COMPLETE PETITE) 35-5-1-200 MG CAPS Take 1 tablet by mouth daily. Patient not taking: Reported on 08/21/2016 06/11/16   Levie HeritageStinson, Jacob J, DO  Vitamin D, Ergocalciferol, (DRISDOL) 50000 units CAPS capsule Take 1 capsule (50,000 Units total) by mouth every 7 (seven) days. Patient taking differently: Take 50,000 Units by mouth every 7 (seven) days. Wednesday 03/15/16   Roe Coombsenney, Rachelle A, CNM    Family History No family history on file.  Social History Social History   Tobacco Use  .  Smoking status: Never Smoker  . Smokeless tobacco: Never Used  Substance Use Topics  . Alcohol use: No  . Drug use: Yes    Types: Methamphetamines    Comment: last used in Aug/Sept??     Allergies   Patient has no known allergies.   Review of Systems Review of Systems  Constitutional: Negative for chills and fever.  HENT: Positive for dental problem and facial swelling (left).   All other systems reviewed and are negative.    Physical Exam Updated Vital Signs BP 123/79 (BP Location: Right Arm)   Pulse 87   Temp 98.5 F (36.9 C) (Oral)   Resp 18   Ht 5\' 7"  (1.702 m)   Wt 64.4 kg (142 lb)   SpO2 100%   BMI 22.24 kg/m   Physical Exam    Constitutional: She is oriented to person, place, and time. She appears well-developed and well-nourished. No distress.  HENT:  Right Ear: Tympanic membrane normal.  Left Ear: Tympanic membrane normal.  Nose: Nose normal.  Mouth/Throat: Uvula is midline, oropharynx is clear and moist and mucous membranes are normal. Dental abscesses and dental caries present.    Facial swelling noted left jaw area  Eyes: EOM are normal.  Neck: Neck supple.  Cardiovascular: Normal rate and regular rhythm.  Pulmonary/Chest: Effort normal and breath sounds normal.  Musculoskeletal: Normal range of motion.  Lymphadenopathy:    She has cervical adenopathy (left).  Neurological: She is alert and oriented to person, place, and time. No cranial nerve deficit.  Skin: Skin is warm and dry.  Psychiatric: She has a normal mood and affect.  Nursing note and vitals reviewed.    ED Treatments / Results  Labs (all labs ordered are listed, but only abnormal results are displayed) Labs Reviewed - No data to display  Radiology No results found.  Procedures Procedures (including critical care time)  Medications Ordered in ED Medications - No data to display   Initial Impression / Assessment and Plan / ED Course  I have reviewed the triage vital signs and the nursing notes. Patient with toothache.  No gross abscess.  Exam unconcerning for Ludwig's angina or spread of infection.  Will treat with Amoxicillin and anti-inflammatories medicine.  Urged patient to follow-up with dentist.  Resource given.  Final Clinical Impressions(s) / ED Diagnoses   Final diagnoses:  Infected dental caries    ED Discharge Orders        Ordered    amoxicillin (AMOXIL) 500 MG capsule  3 times daily     05/16/17 1656    naproxen (NAPROSYN) 375 MG tablet  2 times daily     05/16/17 [address] [place], NP 05/16/17 1704    Shaune Pollack, MD 05/17/17 [phone]

## 2017-05-29 DIAGNOSIS — F419 Anxiety disorder, unspecified: Secondary | ICD-10-CM | POA: Insufficient documentation

## 2017-05-29 DIAGNOSIS — Z7289 Other problems related to lifestyle: Secondary | ICD-10-CM | POA: Insufficient documentation

## 2017-05-29 NOTE — Progress Notes (Signed)
Subjective:   Patient ID: Kristin Hart    DOB: 1980/06/04, 37 y.o. female   MRN: [id number]  Kristin Hart is a 37 y.o. female with a history of asthma, hypothyroidism, borderline personality disorder here for   Establish Care - problem list, PMFSH and medications reviewed and updated - recent pregnancy 06/2016, contraception: depo. Follows at [facility]. [facility] and given resources for food, shelter, transportation which she is following up on.  - diabetes during pregnancy, was on glyburide and metformin. No recent A1c. Previously had type 2 diabetes per patient, but lost a lot of weight 2015-2016 with normalization of blood sugars. - h/o hypothyroidism: no treatment for a few years. Denies weight changes, heat or cold intolerance, skin or hair changes, palpitations. - h/o borderline personality disorder (bipolar disorder per patient), [facility] Previously on prozac, klonopin and ritalin. Not seen in a few years.  - Social: lives with boyfriend, son, and boyfriend's parents at boyfriend's parents home. Has to leave within the week. On waiting list at [facility] house.  - current drug use: marijuana occasionally, denies other drug use (amphetamine use on chart). Alcohol use once in a while, last drink was 03/11/2017. - recent ED visit for dental caries, was given amoxicillin and encouraged to see dentist, has not seen yet. Last saw dentist 2014-15. Brushes teeth intermittently, doesn't floss. - h/o asthma, uses albuterol inhaler once or twice in last month. Typical triggers are exercise and stress.  Review of Systems:  Per HPI.   PMFSH: reviewed. Smoking status reviewed. Medications reviewed.  Objective:   BP 120/80   Pulse 67   Temp 99 F (37.2 C) (Oral)   Ht 5' 7.5" (1.715 m)   Wt 146 lb 9.6 oz (66.5 kg)   SpO2 99%   Breastfeeding? No   BMI 22.62 kg/m  Vitals and nursing note reviewed.  General: well nourished with non-toxic  appearance, tearful on discussing social situation. HEENT: normocephalic, atraumatic, moist mucous membranes CV: regular rate  Lungs: normal work of breathing Skin: warm, dry. Abrasions noted at bilateral wrists, patient endorses picking behavior. Extremities: warm and well perfused, normal tone MSK: ROM grossly intact, strength intact, gait normal Neuro: Alert and oriented, speech normal Psych: tearful. Mood sad but hopeful. Affect appropriate.  Estimated body mass index is 22.62 kg/m as calculated from the following:   Height as of this encounter: 5' 7.5" (1.715 m).   Weight as of this encounter: 146 lb 9.6 oz (66.5 kg).  Depression screen [facility] 2/9 05/30/2017 05/30/2017 08/21/2016  Decreased Interest 0 0 0  Down, Depressed, Hopeless 0 1 1  PHQ - 2 Score 0 1 1  Altered sleeping 1 - 1  Tired, decreased energy 1 - 1  Change in appetite 1 - 0  Feeling bad or failure about yourself  1 - 1  Trouble concentrating 1 - 0  Moving slowly or fidgety/restless 1 - 1  Suicidal thoughts 0 - 0  PHQ-9 Score 6 - 5  Difficult doing work/chores Somewhat difficult - -   GAD 7 : Generalized Anxiety Score 05/30/2017 08/21/2016 07/03/2016 06/22/2016  Nervous, Anxious, on Edge 2 1 1 1   Control/stop worrying 2 1 1 1   Worry too much - different things 1 1 0 0  Trouble relaxing 1 1 1 1   Restless 1 0 1 1  Easily annoyed or irritable 1 1 0 1  Afraid - awful might happen 0 0 0 0  Total GAD  7 Score 8 5 4 5   Anxiety Difficulty Somewhat difficult - - -    Assessment & Plan:   Hypothyroidism Not currently on treatment. With recent pregnancy and h/o hypothyroidism, will check TSH, T3, T4 to assess need for treatment, although not symptomatic.  Depression Bipolar (per patient) vs. Borderline personality d/o (listed in previous notes). [facility] but not [facility] in a few years. Patient appropriately tearful today due to unstable home situation and chronic homelessness. PHQ9 and GAD7 completed today.  Warm hand off complete today, see separate note for details. Seems to be acting [facility]. Believe patient would benefit from further evaluation and management of psychiatric disorders with psychiatry, referral placed.  Diabetes mellitus affecting pregnancy Previous type 2 diabetic but no treatment needed since weight loss a few years ago per patient. A1c today 6.1, indicating prediabetes. BMI within normal range, so no need for weight loss. Encourage increasing fruits, vegetables, whole grains and reduce sugary foods and beverages and incorporating daily exercise. Realize lifestyle changes may be difficult as long as social situation is unstable. Would like to recheck A1c in 6 months.  Orders Placed This Encounter  Procedures  . TSH  . T3, Free  . T4, Free  . Ambulatory referral to Psychiatry    Referral Priority:   Routine    Referral Type:   Psychiatric    Referral Reason:   Specialty Services Required    Requested Specialty:   Psychiatry    Number of Visits Requested:   1  . POCT glycosylated hemoglobin (Hb A1C)   No orders of the defined types were placed in this encounter.   Ellwood DenseAlison Jamea Robicheaux, DO PGY-1, [facility] 05/31/2017 12:27 PM

## 2017-05-30 ENCOUNTER — Encounter: Payer: Self-pay | Admitting: Licensed Clinical Social Worker

## 2017-05-30 ENCOUNTER — Encounter: Payer: Self-pay | Admitting: Family Medicine

## 2017-05-30 ENCOUNTER — Other Ambulatory Visit: Payer: Self-pay

## 2017-05-30 ENCOUNTER — Ambulatory Visit (INDEPENDENT_AMBULATORY_CARE_PROVIDER_SITE_OTHER): Payer: Medicaid Other | Admitting: Family Medicine

## 2017-05-30 VITALS — BP 120/80 | HR 67 | Temp 99.0°F | Ht 67.5 in | Wt 146.6 lb

## 2017-05-30 DIAGNOSIS — F319 Bipolar disorder, unspecified: Secondary | ICD-10-CM

## 2017-05-30 DIAGNOSIS — F329 Major depressive disorder, single episode, unspecified: Secondary | ICD-10-CM

## 2017-05-30 DIAGNOSIS — E039 Hypothyroidism, unspecified: Secondary | ICD-10-CM

## 2017-05-30 DIAGNOSIS — R739 Hyperglycemia, unspecified: Secondary | ICD-10-CM | POA: Diagnosis present

## 2017-05-30 DIAGNOSIS — F32A Depression, unspecified: Secondary | ICD-10-CM

## 2017-05-30 DIAGNOSIS — O24913 Unspecified diabetes mellitus in pregnancy, third trimester: Secondary | ICD-10-CM

## 2017-05-30 LAB — POCT GLYCOSYLATED HEMOGLOBIN (HGB A1C): Hemoglobin A1C: 6.1

## 2017-05-30 NOTE — Patient Instructions (Signed)
It was great to see you!  For your previous hypothyroidism and diabetes,  - We are checking some labs today, we will call you or send you a letter if they are abnormal.   For your bipolar disorder, - I will refer you to Psychiatry to get re-established and on the right medication regimen to help with your depression and anxiety.  You are doing a great job at taking the steps to secure a brighter future for yourself and your family - keep up the great work! Life is tough sometimes, if you ever have thoughts of hurting yourself or others, don't hesitate to call us or come see us.   Take care and seek immediate care sooner if you develop any concerns.   Ellwood DenseAlison Irelyn Perfecto, DO [facility]

## 2017-05-30 NOTE — Progress Notes (Signed)
Type of Service: Clinical Social Work Consult  Veronia Beetsshley R Mires is a 37 y.o. female referred by Dr. Linwood Dibblesumball for community resources  Patient reports :concerns with Financial difficulties.  Housing and food.   Life & Social patient lives with 5411 month old son and father of baby ,Temp. Living with grandparents with a weeks notice of finding a new place to live.  Patient chronic homeless, lived last [address]  Patient nor baby's father has income.    Issues discussed: support system, reviewed community resources provided by [facility] L[facility], Patient is on the wait list for pathway shelter.    Intervention: supportive counseling, solutions focus strategies, community resources; provided patient information for employment and training programs; bag of food from the food pantry; and another list of family shelters.  Patient appreciative of information and resources provided. PCP will make referral to psychiatry for treatment of mood disorder.   Sammuel Hineseborah Kailee Essman, LCSW Licensed Clinical Social Worker [facility]   [phone] 3:40 PM

## 2017-05-31 ENCOUNTER — Telehealth: Payer: Self-pay

## 2017-05-31 LAB — T3, FREE: T3, Free: 3.5 pg/mL (ref 2.0–4.4)

## 2017-05-31 LAB — T4, FREE: Free T4: 1.22 ng/dL (ref 0.82–1.77)

## 2017-05-31 LAB — TSH: TSH: 1.54 u[IU]/mL (ref 0.450–4.500)

## 2017-05-31 NOTE — Telephone Encounter (Signed)
Pt returning call to MD regarding labs. Given information noted in chart by MD. Shawna OrleansMeredith B Raynelle Fujikawa, RN

## 2017-05-31 NOTE — Assessment & Plan Note (Addendum)
Previous type 2 diabetic but no treatment needed since weight loss a few years ago per patient. A1c today 6.1, indicating prediabetes. BMI within normal range, so no need for weight loss. Encourage increasing fruits, vegetables, whole grains and reduce sugary foods and beverages and incorporating daily exercise. Realize lifestyle changes may be difficult as long as social situation is unstable. Would like to recheck A1c in 6 months.

## 2017-05-31 NOTE — Assessment & Plan Note (Signed)
Not currently on treatment. With recent pregnancy and h/o hypothyroidism, will check TSH, T3, T4 to assess need for treatment, although not symptomatic.

## 2017-05-31 NOTE — Assessment & Plan Note (Signed)
Bipolar (per patient) vs. Borderline personality d/o (listed in previous notes). [facility] but not [facility] in a few years. Patient appropriately tearful today due to unstable home situation and chronic homelessness. PHQ9 and GAD7 completed today. Warm hand off complete today, see separate note for details. [facility] to be [facility]. Believe patient would benefit from further evaluation and management of psychiatric disorders with psychiatry, referral placed.

## 2017-06-03 ENCOUNTER — Encounter: Payer: Self-pay | Admitting: *Deleted

## 2017-07-09 ENCOUNTER — Encounter: Payer: Self-pay | Admitting: Licensed Clinical Social Worker

## 2017-07-09 NOTE — Progress Notes (Signed)
Type of Service: Clinical Social Work Government social research officer from Dr. Linwood Dibbles to assist patient's family with food. Patient in with son and son's father. LCSW assessed need.  Provided food and toiletries from [facility] pantry and gave copy of [employer].  Son's father to start work within the next week.  Patient and son's father appreciative of items provided.  Sammuel Hines, LCSW Licensed Clinical Social Worker [facility]   [phone] 2:49 PM

## 2017-07-21 NOTE — Progress Notes (Deleted)
   Subjective:   Patient ID: Kristin Hart    DOB: 1980/10/06, 37 y.o. female   MRN: [id number]  Kristin Hart is a 37 y.o. female with a history of asthma, hypothyroidism, ADD, depression here for   Asthma - meds: albuterol PRN, *** - common triggers: exercise, stress*** - ED visits/hospitalizations: none within the last year - smoking status: smokes marijuana occasionally  Pain in Hip - ***  Behavioral Issues? - ***  Review of Systems:  Per HPI.   PMFSH: reviewed. Smoking status reviewed. Medications reviewed.  Objective:   There were no vitals taken for this visit. Vitals and nursing note reviewed.  General: well nourished, well developed, in no acute distress with non-toxic appearance HEENT: normocephalic, atraumatic, moist mucous membranes Neck: supple, non-tender without lymphadenopathy CV: regular rate and rhythm without murmurs, rubs, or gallops, no lower extremity edema Lungs: clear to auscultation bilaterally with normal work of breathing Abdomen: soft, non-tender, non-distended, no masses or organomegaly palpable, normoactive bowel sounds Skin: warm, dry, no rashes or lesions Extremities: warm and well perfused, normal tone MSK: ROM grossly intact, strength intact, gait normal Neuro: Alert and oriented, speech normal  Assessment & Plan:   No problem-specific Assessment & Plan notes found for this encounter.  No orders of the defined types were placed in this encounter.  No orders of the defined types were placed in this encounter.   Ellwood Dense, DO PGY-1, [facility] 07/21/2017 3:58 PM

## 2017-07-22 ENCOUNTER — Ambulatory Visit: Payer: Self-pay | Admitting: Family Medicine

## 2017-07-30 ENCOUNTER — Ambulatory Visit: Payer: Self-pay

## 2017-08-07 ENCOUNTER — Ambulatory Visit (INDEPENDENT_AMBULATORY_CARE_PROVIDER_SITE_OTHER): Payer: Medicaid Other

## 2017-08-07 ENCOUNTER — Encounter: Payer: Self-pay | Admitting: Family Medicine

## 2017-08-07 VITALS — BP 126/56 | HR 81 | Wt 138.0 lb

## 2017-08-07 DIAGNOSIS — Z3042 Encounter for surveillance of injectable contraceptive: Secondary | ICD-10-CM

## 2017-08-07 DIAGNOSIS — Z308 Encounter for other contraceptive management: Secondary | ICD-10-CM

## 2017-08-07 MED ORDER — MEDROXYPROGESTERONE ACETATE 150 MG/ML IM SUSP
150.0000 mg | Freq: Once | INTRAMUSCULAR | Status: AC
  Administered 2017-08-07: 150 mg via INTRAMUSCULAR

## 2017-08-07 NOTE — Progress Notes (Signed)
Date last pap: 04/12/2016. Last Depo-Provera: 05/14/2017. Side Effects if any: none Serum HCG indicated? no Depo-Provera 150 mg IM given by: Darrol Angel, CMA(AAMA). Next appointment due 08/14-08/28

## 2017-08-13 DIAGNOSIS — H16223 Keratoconjunctivitis sicca, not specified as Sjogren's, bilateral: Secondary | ICD-10-CM | POA: Diagnosis not present

## 2017-08-13 DIAGNOSIS — H40033 Anatomical narrow angle, bilateral: Secondary | ICD-10-CM | POA: Diagnosis not present

## 2017-08-24 LAB — GLUCOSE, POCT (MANUAL RESULT ENTRY): POC Glucose: 120 mg/dl — AB (ref 70–99)

## 2017-09-19 ENCOUNTER — Emergency Department (HOSPITAL_COMMUNITY)
Admission: EM | Admit: 2017-09-19 | Discharge: 2017-09-19 | Disposition: A | Discharge status: Home / Self Care | Payer: Medicaid Other | Attending: Emergency Medicine | Admitting: Emergency Medicine

## 2017-09-19 ENCOUNTER — Encounter (HOSPITAL_COMMUNITY): Payer: Self-pay | Admitting: Emergency Medicine

## 2017-09-19 DIAGNOSIS — E876 Hypokalemia: Secondary | ICD-10-CM | POA: Diagnosis not present

## 2017-09-19 DIAGNOSIS — J45909 Unspecified asthma, uncomplicated: Secondary | ICD-10-CM | POA: Insufficient documentation

## 2017-09-19 DIAGNOSIS — N760 Acute vaginitis: Secondary | ICD-10-CM | POA: Diagnosis not present

## 2017-09-19 DIAGNOSIS — A5900 Urogenital trichomoniasis, unspecified: Secondary | ICD-10-CM | POA: Diagnosis not present

## 2017-09-19 DIAGNOSIS — Z79899 Other long term (current) drug therapy: Secondary | ICD-10-CM | POA: Insufficient documentation

## 2017-09-19 DIAGNOSIS — E119 Type 2 diabetes mellitus without complications: Secondary | ICD-10-CM | POA: Insufficient documentation

## 2017-09-19 DIAGNOSIS — E039 Hypothyroidism, unspecified: Secondary | ICD-10-CM | POA: Insufficient documentation

## 2017-09-19 DIAGNOSIS — N898 Other specified noninflammatory disorders of vagina: Secondary | ICD-10-CM | POA: Diagnosis present

## 2017-09-19 DIAGNOSIS — A599 Trichomoniasis, unspecified: Secondary | ICD-10-CM

## 2017-09-19 LAB — URINALYSIS, ROUTINE W REFLEX MICROSCOPIC
Glucose, UA: NEGATIVE mg/dL
Ketones, ur: NEGATIVE mg/dL
Nitrite: NEGATIVE
Protein, ur: 30 mg/dL — AB
Specific Gravity, Urine: 1.021 (ref 1.005–1.030)
WBC, UA: >50 WBC/hpf — ABNORMAL HIGH (ref 0–5)
pH: 7 (ref 5.0–8.0)

## 2017-09-19 LAB — COMPREHENSIVE METABOLIC PANEL
ALT: 20 U/L (ref 0–44)
AST: 25 U/L (ref 15–41)
Albumin: 4.1 g/dL (ref 3.5–5.0)
Alkaline Phosphatase: 61 U/L (ref 38–126)
Anion gap: 11 (ref 5–15)
BUN: 13 mg/dL (ref 6–20)
CO2: 21 mmol/L — ABNORMAL LOW (ref 22–32)
Calcium: 9.7 mg/dL (ref 8.9–10.3)
Chloride: 110 mmol/L (ref 98–111)
Creatinine, Ser: 0.94 mg/dL (ref 0.44–1.00)
GFR calc Af Amer: >60 mL/min (ref >60)
GFR calc non Af Amer: >60 mL/min (ref >60)
Glucose, Bld: 143 mg/dL — ABNORMAL HIGH (ref 70–99)
Potassium: 3.2 mmol/L — ABNORMAL LOW (ref 3.5–5.1)
Sodium: 142 mmol/L (ref 135–145)
Total Bilirubin: 0.8 mg/dL (ref 0.3–1.2)
Total Protein: 6.7 g/dL (ref 6.5–8.1)

## 2017-09-19 LAB — CBC WITH DIFFERENTIAL/PLATELET
Abs Immature Granulocytes: 0 10*3/uL (ref 0.0–0.1)
Basophils Absolute: 0 10*3/uL (ref 0.0–0.1)
Basophils Relative: 1 %
Eosinophils Absolute: 0.2 10*3/uL (ref 0.0–0.7)
Eosinophils Relative: 2 %
HCT: 42.2 % (ref 36.0–46.0)
Hemoglobin: 13.6 g/dL (ref 12.0–15.0)
Immature Granulocytes: 0 %
Lymphocytes Relative: 35 %
Lymphs Abs: 2.9 10*3/uL (ref 0.7–4.0)
MCH: 27.7 pg (ref 26.0–34.0)
MCHC: 32.2 g/dL (ref 30.0–36.0)
MCV: 85.9 fL (ref 78.0–100.0)
Monocytes Absolute: 0.8 10*3/uL (ref 0.1–1.0)
Monocytes Relative: 10 %
Neutro Abs: 4.3 10*3/uL (ref 1.7–7.7)
Neutrophils Relative %: 52 %
Platelets: 318 10*3/uL (ref 150–400)
RBC: 4.91 MIL/uL (ref 3.87–5.11)
RDW: 13.2 % (ref 11.5–15.5)
WBC: 8.3 10*3/uL (ref 4.0–10.5)

## 2017-09-19 LAB — I-STAT TROPONIN, ED: Troponin i, poc: 0 ng/mL (ref 0.00–0.08)

## 2017-09-19 LAB — WET PREP, GENITAL
Sperm: NONE SEEN
Yeast Wet Prep HPF POC: NONE SEEN

## 2017-09-19 MED ORDER — LIDOCAINE HCL (PF) 1 % IJ SOLN
INTRAMUSCULAR | Status: AC
  Filled 2017-09-19: qty 5

## 2017-09-19 MED ORDER — METRONIDAZOLE 500 MG PO TABS: 500.0000 mg | ORAL_TABLET | Freq: Two times a day (BID) | ORAL | 0 refills | Status: DC

## 2017-09-19 MED ORDER — POTASSIUM CHLORIDE ER 20 MEQ PO TBCR: 20.0000 meq | EXTENDED_RELEASE_TABLET | Freq: Every day | ORAL | 0 refills | Status: DC

## 2017-09-19 MED ORDER — CEFTRIAXONE SODIUM 250 MG IJ SOLR
250.0000 mg | Freq: Once | INTRAMUSCULAR | Status: AC
  Administered 2017-09-19: 250 mg via INTRAMUSCULAR
  Filled 2017-09-19: qty 250

## 2017-09-19 MED ORDER — OXYCODONE-ACETAMINOPHEN 5-325 MG PO TABS
1.0000 | ORAL_TABLET | Freq: Once | ORAL | Status: AC
  Administered 2017-09-19: 1 via ORAL
  Filled 2017-09-19: qty 1

## 2017-09-19 MED ORDER — DOXYCYCLINE HYCLATE 100 MG PO CAPS: 100.0000 mg | ORAL_CAPSULE | Freq: Two times a day (BID) | ORAL | 0 refills | Status: DC

## 2017-09-19 MED ORDER — FLUCONAZOLE 150 MG PO TABS: 150.0000 mg | ORAL_TABLET | Freq: Every day | ORAL | 0 refills | Status: AC

## 2017-09-19 NOTE — ED Triage Notes (Signed)
Pt with complaints of green discharge and pain to vaginal area for 3 weeks. Reports she was treated for yeast infection but no change. Denies abd pain, N/V.

## 2017-09-19 NOTE — Discharge Instructions (Addendum)
Please read attached information. If you experience any new or worsening signs or symptoms please return to the emergency room for evaluation. Please follow-up with your primary care provider or specialist as discussed. Please use medication prescribed only as directed and discontinue taking if you have any concerning signs or symptoms.   °
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
## 2017-09-19 NOTE — ED Provider Notes (Signed)
[facility] EMERGENCY DEPARTMENT Provider Note   CSN: [id number] Arrival date & time: 09/19/17  1620    History   Chief Complaint Chief Complaint  Patient presents with  . SEXUALLY TRANSMITTED DISEASE    HPI Kristin Hart is a 37 y.o. female.  HPI   37 year old female presents today with complaints of vaginal discharge.patient notes two-week history of thick green vaginal discharge with occasional blood. Patient and she was seen 2 weeks ago and treated for yeast infection symptoms have not improved. She had swelling of the labia. She denies any abdominal pain fever nausea or vomiting. Patient reports she is sexually active with men and women.   Past Medical History:  Diagnosis Date  . Asthma   . 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 Diabetes mellitus without complication (HCC)    type 2  . Hypothyroidism   . Psychiatric disorder   . Stomach ulcer     Patient Active Problem List   Diagnosis Date Noted  . Anxiety 05/29/2017  . Self-mutilation 05/29/2017  . Depression 06/07/2016  . Marijuana use 06/07/2016  . Low vitamin D level 03/15/2016  . Diabetes mellitus affecting pregnancy 02/22/2016  . Hypothyroidism 02/22/2016  . Asthma 02/22/2016  . ADD (attention deficit disorder) 02/22/2016  . 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 Hyperlipidemia with target LDL less than 70 01/31/2012  . Migraine 01/31/2012  . Tremor, 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    Past Surgical History:  Procedure Laterality Date  . CESAREAN SECTION N/A 07/04/2016   Procedure: CESAREAN SECTION;  Surgeon: [name], DO;  Location: [facility] BIRTHING SUITES;  Service: Obstetrics;  Laterality: N/A;  . NO PAST SURGERIES       OB History    Gravida  1   Para  1   Term  1   Preterm      AB      Living        SAB      TAB      Ectopic      Multiple  0   Live Births               Home Medications    Prior to  Admission medications   Medication Sig Start Date End Date Taking? Authorizing Provider  albuterol (PROAIR HFA) 108 (90 Base) MCG/ACT inhaler INHALE 2 PUFFS INTO THE LUNGS EVERY 6 HOURS AS NEEDED FOR WHEEZING/SHORTNESS OF BREATH 06/11/16   [provider]  amoxicillin (AMOXIL) 500 MG capsule Take 1 capsule (500 mg total) by mouth 3 (three) times daily. 05/16/17   Janne Napoleon, NP  cetirizine (ZYRTEC) 10 MG tablet Take 10 mg by mouth daily. 06/29/16   [provider]  doxycycline (VIBRAMYCIN) 100 MG capsule Take 1 capsule (100 mg total) by mouth 2 (two) times daily. 09/19/17   Blaize Epple, Tinnie Gens, PA-C  guaiFENesin (MUCINEX) 600 MG 12 hr tablet Take 1 tablet (600 mg total) by mouth 2 (two) times daily. 08/21/16   Aviva Signs, CNM  ibuprofen (ADVIL,MOTRIN) 600 MG tablet Take 1 tablet (600 mg total) by mouth every 6 (six) hours as needed. Patient not taking: Reported on 05/30/2017 07/07/16   Arabella Merles, CNM  metroNIDAZOLE (FLAGYL) 500 MG tablet Take 1 tablet (500 mg total) by mouth 2 (two) times daily. 09/19/17   Skye Rodarte, Tinnie Gens, PA-C  naproxen (NAPROSYN) 375 MG tablet Take 1 tablet (375 mg total) by mouth 2 (two) times daily.  05/16/17   Janne NapoleonNeese, Hope M, NP  potassium chloride 20 MEQ TBCR Take 20 mEq by mouth daily. 09/19/17   Kathlean Cinco, Tinnie GensJeffrey, PA-C  Prenat-FeCbn-FeAspGl-FA-Omega (OB COMPLETE PETITE) 35-5-1-200 MG CAPS Take 1 tablet by mouth daily. Patient not taking: Reported on 08/21/2016 06/11/16   Levie HeritageStinson, Jacob J, DO  Vitamin D, Ergocalciferol, (DRISDOL) 50000 units CAPS capsule Take 1 capsule (50,000 Units total) by mouth every 7 (seven) days. Patient not taking: Reported on 05/30/2017 03/15/16   Roe Coombsenney, Rachelle A, CNM    Family History No family history on file.  Social History Social History   Tobacco Use  . Smoking status: Never Smoker  . Smokeless tobacco: Never Used  Substance Use Topics  . Alcohol use: No  . Drug use: Yes    Types: Methamphetamines    Comment: last used  in Aug/Sept??     Allergies   Patient has no known allergies.   Review of Systems Review of Systems  All other systems reviewed and are negative.  Physical Exam Updated Vital Signs BP (!) 123/109   Pulse (!) 109   Temp 98.8 F (37.1 C) (Oral)   Resp 20   SpO2 99%   Physical Exam  Constitutional: She is oriented to person, place, and time. She appears well-developed and well-nourished.  HENT:  Head: Normocephalic and atraumatic.  Eyes: Pupils are equal, round, and reactive to light. Conjunctivae are normal. Right eye exhibits no discharge. Left eye exhibits no discharge. No scleral icterus.  Neck: Normal range of motion. No JVD present. No tracheal deviation present.  Pulmonary/Chest: Effort normal. No stridor.  Musculoskeletal:  Labial swelling and inflammation, purulent vaginal discharge, exquisitely painful exam, unable to discern cervical motion pain from vaginal vault discomfort  Neurological: She is alert and oriented to person, place, and time. Coordination normal.  Psychiatric: She has a normal mood and affect. Her behavior is normal. Judgment and thought content normal.  Nursing note and vitals reviewed.   ED Treatments / Results  Labs (all labs ordered are listed, but only abnormal results are displayed) Labs Reviewed  WET PREP, GENITAL - Abnormal; Notable for the following components:      Result Value   Trich, Wet Prep PRESENT (*)    Clue Cells Wet Prep HPF POC PRESENT (*)    WBC, Wet Prep HPF POC MANY (*)    All other components within normal limits  URINALYSIS, ROUTINE W REFLEX MICROSCOPIC - Abnormal; Notable for the following components:   APPearance CLOUDY (*)    Hgb urine dipstick MODERATE (*)    Bilirubin Urine SMALL (*)    Protein, ur 30 (*)    Leukocytes, UA LARGE (*)    WBC, UA >50 (*)    Bacteria, UA MANY (*)    All other components within normal limits  COMPREHENSIVE METABOLIC PANEL - Abnormal; Notable for the following components:    Potassium 3.2 (*)    CO2 21 (*)    Glucose, Bld 143 (*)    All other components within normal limits  CBC WITH DIFFERENTIAL/PLATELET  RPR  HIV ANTIBODY (ROUTINE TESTING)  I-STAT BETA HCG BLOOD, ED ([facility], [facility], [facility] ONLY)  I-STAT TROPONIN, ED  GC/CHLAMYDIA PROBE AMP ([place] NOT [facility]    EKG None  Radiology No results found.  Procedures Procedures (including critical care time)  Medications Ordered in ED Medications  lidocaine (PF) (XYLOCAINE) 1 % injection (has no administration in time range)  oxyCODONE-acetaminophen (PERCOCET/ROXICET) 5-325 MG per tablet 1 tablet (has  no administration in time range)  cefTRIAXone (ROCEPHIN) injection 250 mg (250 mg Intramuscular Given 09/19/17 1733)     Initial Impression / Assessment and Plan / ED Course  I have reviewed the triage vital signs and the nursing notes.  Pertinent labs & imaging results that were available during my care of the patient were reviewed by me and considered in my medical decision making (see chart for details).     Labs: I-STAT troponin, wet prep, CBC, RPR, HIV, CMP  Imaging:  Consults:  Therapeutics:ceftriaxone, Percocet  Discharge Meds: doxycycline, metronidazole, potassium  Assessment/Plan: 37 year old female presents today with pelvic infection. High suspicion for STD. Patient's exam was acutely uncomfortable throughout the entire process, she was tearful. Unable to discern PID versus vaginitis. Patient will be treated for pelvic inflammatory disease with doxycycline, ceftriaxone.patient also notable for trichomoniasis treated with metronidazole.  Patient's labs significant for hypokalemia as well, she'll be given potassium encouraged to increase dietary potassium rich foods and follow-up with her primary care for repeat laboratory analysis.   Final Clinical Impressions(s) / ED Diagnoses   Final diagnoses:  Acute vaginitis  Trichimoniasis  Hypokalemia    ED Discharge Orders        Ordered     metroNIDAZOLE (FLAGYL) 500 MG tablet  2 times daily     09/19/17 1830    doxycycline (VIBRAMYCIN) 100 MG capsule  2 times daily     09/19/17 1830    potassium chloride 20 MEQ TBCR  Daily     09/19/17 1832       Eyvonne Mechanic, PA-C 09/19/17 1833    Melene Plan, DO 09/19/17 1842

## 2017-09-20 LAB — GC/CHLAMYDIA PROBE AMP (~~LOC~~) NOT AT ARMC
Chlamydia: NEGATIVE
Neisseria Gonorrhea: NEGATIVE

## 2017-09-20 LAB — RPR: RPR Ser Ql: NONREACTIVE

## 2017-09-20 LAB — HIV ANTIBODY (ROUTINE TESTING W REFLEX): HIV Screen 4th Generation wRfx: NONREACTIVE

## 2017-09-29 ENCOUNTER — Encounter (HOSPITAL_COMMUNITY): Payer: Self-pay | Admitting: Emergency Medicine

## 2017-09-29 ENCOUNTER — Other Ambulatory Visit: Payer: Self-pay

## 2017-09-29 ENCOUNTER — Emergency Department (HOSPITAL_COMMUNITY)
Admission: EM | Admit: 2017-09-29 | Discharge: 2017-09-29 | Disposition: A | Discharge status: Home / Self Care | Payer: Medicaid Other | Attending: Emergency Medicine | Admitting: Emergency Medicine

## 2017-09-29 DIAGNOSIS — E039 Hypothyroidism, unspecified: Secondary | ICD-10-CM | POA: Diagnosis not present

## 2017-09-29 DIAGNOSIS — B9689 Other specified bacterial agents as the cause of diseases classified elsewhere: Secondary | ICD-10-CM

## 2017-09-29 DIAGNOSIS — N760 Acute vaginitis: Secondary | ICD-10-CM | POA: Diagnosis not present

## 2017-09-29 DIAGNOSIS — E119 Type 2 diabetes mellitus without complications: Secondary | ICD-10-CM | POA: Diagnosis not present

## 2017-09-29 DIAGNOSIS — Z79899 Other long term (current) drug therapy: Secondary | ICD-10-CM | POA: Insufficient documentation

## 2017-09-29 DIAGNOSIS — J45909 Unspecified asthma, uncomplicated: Secondary | ICD-10-CM | POA: Diagnosis not present

## 2017-09-29 DIAGNOSIS — N898 Other specified noninflammatory disorders of vagina: Secondary | ICD-10-CM | POA: Diagnosis present

## 2017-09-29 LAB — URINALYSIS, COMPLETE (UACMP) WITH MICROSCOPIC
Bilirubin Urine: NEGATIVE
Glucose, UA: NEGATIVE mg/dL
Hgb urine dipstick: NEGATIVE
Ketones, ur: NEGATIVE mg/dL
Nitrite: NEGATIVE
Protein, ur: NEGATIVE mg/dL
Specific Gravity, Urine: 1.02 (ref 1.005–1.030)
WBC, UA: >50 WBC/hpf — ABNORMAL HIGH (ref 0–5)
pH: 5 (ref 5.0–8.0)

## 2017-09-29 LAB — WET PREP, GENITAL
Sperm: NONE SEEN
Trich, Wet Prep: NONE SEEN
Yeast Wet Prep HPF POC: NONE SEEN

## 2017-09-29 LAB — POC URINE PREG, ED: Preg Test, Ur: NEGATIVE

## 2017-09-29 MED ORDER — METRONIDAZOLE 500 MG PO TABS: 500.0000 mg | ORAL_TABLET | Freq: Two times a day (BID) | ORAL | 0 refills | Status: DC

## 2017-09-29 MED ORDER — FLUCONAZOLE 150 MG PO TABS: 150.0000 mg | ORAL_TABLET | Freq: Every day | ORAL | 1 refills | Status: DC

## 2017-09-29 MED ORDER — FLUCONAZOLE 150 MG PO TABS
150.0000 mg | ORAL_TABLET | Freq: Once | ORAL | Status: AC
  Administered 2017-09-29: 150 mg via ORAL
  Filled 2017-09-29: qty 1

## 2017-09-29 NOTE — ED Provider Notes (Signed)
[facility] EMERGENCY DEPARTMENT Provider Note   CSN: [id number] Arrival date & time: 09/29/17  1543     History   Chief Complaint Chief Complaint  Patient presents with  . Vaginal Pain    HPI Kristin Hart is a 37 y.o. female.  The history is provided by the patient. No language interpreter was used.  Vaginal Pain  This is a new problem. The problem occurs constantly. The problem has been gradually worsening. Nothing aggravates the symptoms. Nothing relieves the symptoms. She has tried nothing for the symptoms.  Pt complains of labial swelling and white discharge.  Pt recently was on flagyl and doxycycline for trichomonas  Past Medical History:  Diagnosis Date  . Asthma   . 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 Diabetes mellitus without complication (HCC)    type 2  . Hypothyroidism   . Psychiatric disorder   . Stomach ulcer     Patient Active Problem List   Diagnosis Date Noted  . Anxiety 05/29/2017  . Self-mutilation 05/29/2017  . Depression 06/07/2016  . Marijuana use 06/07/2016  . Low vitamin D level 03/15/2016  . Diabetes mellitus affecting pregnancy 02/22/2016  . Hypothyroidism 02/22/2016  . Asthma 02/22/2016  . ADD (attention deficit disorder) 02/22/2016  . 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 Hyperlipidemia with target LDL less than 70 01/31/2012  . Migraine 01/31/2012  . Tremor, 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    Past Surgical History:  Procedure Laterality Date  . CESAREAN SECTION N/A 07/04/2016   Procedure: CESAREAN SECTION;  Surgeon: [name], DO;  Location: [facility] BIRTHING SUITES;  Service: Obstetrics;  Laterality: N/A;  . NO PAST SURGERIES       OB History    Gravida  1   Para  1   Term  1   Preterm      AB      Living        SAB      TAB      Ectopic      Multiple  0   Live Births               Home Medications    Prior to Admission medications     Medication Sig Start Date End Date Taking? Authorizing Provider  albuterol (PROAIR HFA) 108 (90 Base) MCG/ACT inhaler INHALE 2 PUFFS INTO THE LUNGS EVERY 6 HOURS AS NEEDED FOR WHEEZING/SHORTNESS OF BREATH 06/11/16   [provider]  amoxicillin (AMOXIL) 500 MG capsule Take 1 capsule (500 mg total) by mouth 3 (three) times daily. 05/16/17   Janne Napoleon, NP  cetirizine (ZYRTEC) 10 MG tablet Take 10 mg by mouth daily. 06/29/16   [provider]  doxycycline (VIBRAMYCIN) 100 MG capsule Take 1 capsule (100 mg total) by mouth 2 (two) times daily. 09/19/17   Hedges, Tinnie Gens, PA-C  guaiFENesin (MUCINEX) 600 MG 12 hr tablet Take 1 tablet (600 mg total) by mouth 2 (two) times daily. 08/21/16   Aviva Signs, CNM  ibuprofen (ADVIL,MOTRIN) 600 MG tablet Take 1 tablet (600 mg total) by mouth every 6 (six) hours as needed. Patient not taking: Reported on 05/30/2017 07/07/16   Arabella Merles, CNM  metroNIDAZOLE (FLAGYL) 500 MG tablet Take 1 tablet (500 mg total) by mouth 2 (two) times daily. 09/19/17   Hedges, Tinnie Gens, PA-C  naproxen (NAPROSYN) 375 MG tablet Take 1 tablet (375 mg total) by mouth 2 (two) times daily.  05/16/17   Janne NapoleonNeese, Hope M, NP  potassium chloride 20 MEQ TBCR Take 20 mEq by mouth daily. 09/19/17   Hedges, Tinnie GensJeffrey, PA-C  Prenat-FeCbn-FeAspGl-FA-Omega (OB COMPLETE PETITE) 35-5-1-200 MG CAPS Take 1 tablet by mouth daily. Patient not taking: Reported on 08/21/2016 06/11/16   Levie HeritageStinson, Jacob J, DO  Vitamin D, Ergocalciferol, (DRISDOL) 50000 units CAPS capsule Take 1 capsule (50,000 Units total) by mouth every 7 (seven) days. Patient not taking: Reported on 05/30/2017 03/15/16   Roe Coombsenney, Rachelle A, CNM    Family History No family history on file.  Social History Social History   Tobacco Use  . Smoking status: Never Smoker  . Smokeless tobacco: Never Used  Substance Use Topics  . Alcohol use: No  . Drug use: Yes    Types: Methamphetamines    Comment: last used in Aug/Sept??      Allergies   Patient has no known allergies.   Review of Systems Review of Systems  Genitourinary: Positive for vaginal pain.  All other systems reviewed and are negative.    Physical Exam Updated Vital Signs BP (!) 155/102   Pulse 81   Temp 97.6 F (36.4 C) (Oral)   Resp 20   Ht 5\' 8"  (1.727 m)   Wt 63.5 kg (140 lb)   SpO2 100%   BMI 21.29 kg/m   Physical Exam  Constitutional: She appears well-developed and well-nourished.  HENT:  Head: Normocephalic and atraumatic.  Eyes: Pupils are equal, round, and reactive to light. Conjunctivae are normal.  Neck: Normal range of motion. Neck supple.  Cardiovascular: Normal rate.  Abdominal: Soft. There is no tenderness.  Genitourinary: Vaginal discharge found.  Genitourinary Comments: Vaginal discharge,  Thick white,  Swollen labia  Musculoskeletal: Normal range of motion.  Skin: Skin is warm.     ED Treatments / Results  Labs (all labs ordered are listed, but only abnormal results are displayed) Labs Reviewed  URINALYSIS, COMPLETE (UACMP) WITH MICROSCOPIC - Abnormal; Notable for the following components:      Result Value   APPearance HAZY (*)    Leukocytes, UA MODERATE (*)    WBC, UA >50 (*)    Bacteria, UA FEW (*)    All other components within normal limits  WET PREP, GENITAL  POC URINE PREG, ED  GC/CHLAMYDIA PROBE AMP ([place]) NOT AT [facility]    EKG None  Radiology No results found.  Procedures Procedures (including critical care time)  Medications Ordered in ED Medications  fluconazole (DIFLUCAN) tablet 150 mg (has no administration in time range)     Initial Impression / Assessment and Plan / ED Course  I have reviewed the triage vital signs and the nursing notes.  Pertinent labs & imaging results that were available during my care of the patient were reviewed by me and considered in my medical decision making (see chart for details).     Discharge looks like yeast.  Pt given diflucan  here.  Wet prep shows many clue cells.  I will give repeat rx of flagyl  Final Clinical Impressions(s) / ED Diagnoses   Final diagnoses:  BV (bacterial vaginosis)    ED Discharge Orders        Ordered    metroNIDAZOLE (FLAGYL) 500 MG tablet  2 times daily     09/29/17 2145    fluconazole (DIFLUCAN) 150 MG tablet  Daily     09/29/17 2145       Osie CheeksSofia, Leslie K, PA-C 09/29/17 2146    Pfeiffer,  Lebron Conners, MD 09/29/17 [phone]

## 2017-09-29 NOTE — ED Notes (Signed)
[phone] pt number.

## 2017-09-29 NOTE — ED Notes (Signed)
Discharge instructions and prescriptions discussed with Pt. Pt verbalized understanding. Pt stable and ambulatory.   

## 2017-09-29 NOTE — ED Triage Notes (Signed)
States she was diagnosed with Trichomoniasis 2 weeks ago.  C/o severe vaginal swelling and burning.  States she completed 1 antibiotic but is still taking doxycycline.

## 2017-09-30 LAB — GC/CHLAMYDIA PROBE AMP (~~LOC~~) NOT AT ARMC
Chlamydia: NEGATIVE
Neisseria Gonorrhea: NEGATIVE

## 2017-10-23 ENCOUNTER — Ambulatory Visit (INDEPENDENT_AMBULATORY_CARE_PROVIDER_SITE_OTHER): Payer: Medicaid Other | Admitting: General Practice

## 2017-10-23 VITALS — BP 139/89 | HR 116 | Ht 67.0 in | Wt 128.0 lb

## 2017-10-23 DIAGNOSIS — Z3042 Encounter for surveillance of injectable contraceptive: Secondary | ICD-10-CM

## 2017-10-23 MED ORDER — MEDROXYPROGESTERONE ACETATE 150 MG/ML IM SUSP
150.0000 mg | Freq: Once | INTRAMUSCULAR | Status: AC
  Administered 2017-10-23: 150 mg via INTRAMUSCULAR

## 2017-10-23 NOTE — Progress Notes (Signed)
Kristin BeetsAshley R Hart here for Depo-Provera  Injection.  Injection administered without complication. Patient will return in 3 months for next injection.  Marylynn Pearsonarrie Brandye Inthavong, RN 10/23/2017  2:20 PM

## 2017-10-24 NOTE — Progress Notes (Signed)
I have reviewed this chart and agree with the RN/CMA assessment and management.    Geno Sydnor C Wandalene Abrams, MD, FACOG Attending Physician, [facility]  

## 2017-10-29 ENCOUNTER — Ambulatory Visit: Payer: Medicaid Other | Admitting: Student

## 2017-11-08 ENCOUNTER — Emergency Department (HOSPITAL_COMMUNITY): Payer: Medicaid Other

## 2017-11-08 ENCOUNTER — Emergency Department (HOSPITAL_COMMUNITY): Payer: Medicaid Other | Admitting: Certified Registered Nurse Anesthetist

## 2017-11-08 ENCOUNTER — Ambulatory Visit (HOSPITAL_COMMUNITY)
Admission: EM | Admit: 2017-11-08 | Discharge: 2017-11-08 | Disposition: A | Discharge status: Home / Self Care | Payer: Medicaid Other | Attending: Emergency Medicine | Admitting: Emergency Medicine

## 2017-11-08 ENCOUNTER — Encounter (HOSPITAL_COMMUNITY): Payer: Self-pay | Admitting: Physician Assistant

## 2017-11-08 ENCOUNTER — Encounter (HOSPITAL_COMMUNITY)
Admission: EM | Disposition: A | Discharge status: Home / Self Care | Payer: Self-pay | Source: Home / Self Care | Attending: Emergency Medicine

## 2017-11-08 ENCOUNTER — Other Ambulatory Visit: Payer: Self-pay

## 2017-11-08 DIAGNOSIS — Z79899 Other long term (current) drug therapy: Secondary | ICD-10-CM | POA: Insufficient documentation

## 2017-11-08 DIAGNOSIS — Y92000 Kitchen of unspecified non-institutional (private) residence as  the place of occurrence of the external cause: Secondary | ICD-10-CM | POA: Diagnosis not present

## 2017-11-08 DIAGNOSIS — Z8719 Personal history of other diseases of the digestive system: Secondary | ICD-10-CM | POA: Diagnosis not present

## 2017-11-08 DIAGNOSIS — Z23 Encounter for immunization: Secondary | ICD-10-CM | POA: Insufficient documentation

## 2017-11-08 DIAGNOSIS — F329 Major depressive disorder, single episode, unspecified: Secondary | ICD-10-CM | POA: Insufficient documentation

## 2017-11-08 DIAGNOSIS — E039 Hypothyroidism, unspecified: Secondary | ICD-10-CM | POA: Diagnosis not present

## 2017-11-08 DIAGNOSIS — F603 Borderline personality disorder: Secondary | ICD-10-CM | POA: Diagnosis not present

## 2017-11-08 DIAGNOSIS — R935 Abnormal findings on diagnostic imaging of other abdominal regions, including retroperitoneum: Secondary | ICD-10-CM | POA: Diagnosis not present

## 2017-11-08 DIAGNOSIS — S61211A Laceration without foreign body of left index finger without damage to nail, initial encounter: Secondary | ICD-10-CM | POA: Insufficient documentation

## 2017-11-08 DIAGNOSIS — E119 Type 2 diabetes mellitus without complications: Secondary | ICD-10-CM | POA: Diagnosis not present

## 2017-11-08 DIAGNOSIS — G43909 Migraine, unspecified, not intractable, without status migrainosus: Secondary | ICD-10-CM | POA: Insufficient documentation

## 2017-11-08 DIAGNOSIS — J45909 Unspecified asthma, uncomplicated: Secondary | ICD-10-CM | POA: Diagnosis not present

## 2017-11-08 DIAGNOSIS — W260XXA Contact with knife, initial encounter: Secondary | ICD-10-CM | POA: Diagnosis not present

## 2017-11-08 DIAGNOSIS — X58XXXA Exposure to other specified factors, initial encounter: Secondary | ICD-10-CM | POA: Diagnosis not present

## 2017-11-08 DIAGNOSIS — E785 Hyperlipidemia, unspecified: Secondary | ICD-10-CM | POA: Insufficient documentation

## 2017-11-08 DIAGNOSIS — T183XXA Foreign body in small intestine, initial encounter: Secondary | ICD-10-CM

## 2017-11-08 DIAGNOSIS — S61219A Laceration without foreign body of unspecified finger without damage to nail, initial encounter: Secondary | ICD-10-CM

## 2017-11-08 DIAGNOSIS — F419 Anxiety disorder, unspecified: Secondary | ICD-10-CM | POA: Insufficient documentation

## 2017-11-08 DIAGNOSIS — T182XXA Foreign body in stomach, initial encounter: Secondary | ICD-10-CM | POA: Diagnosis not present

## 2017-11-08 DIAGNOSIS — F1290 Cannabis use, unspecified, uncomplicated: Secondary | ICD-10-CM | POA: Diagnosis not present

## 2017-11-08 DIAGNOSIS — T199XXA Foreign body in genitourinary tract, part unspecified, initial encounter: Secondary | ICD-10-CM

## 2017-11-08 HISTORY — PX: ENTEROSCOPY: SHX5533

## 2017-11-08 LAB — CBC WITH DIFFERENTIAL/PLATELET
Abs Immature Granulocytes: 0 10*3/uL (ref 0.0–0.1)
Basophils Absolute: 0 10*3/uL (ref 0.0–0.1)
Basophils Relative: 0 %
Eosinophils Absolute: 0.6 10*3/uL (ref 0.0–0.7)
Eosinophils Relative: 8 %
HCT: 39 % (ref 36.0–46.0)
Hemoglobin: 12.4 g/dL (ref 12.0–15.0)
Immature Granulocytes: 0 %
Lymphocytes Relative: 35 %
Lymphs Abs: 2.5 10*3/uL (ref 0.7–4.0)
MCH: 28.2 pg (ref 26.0–34.0)
MCHC: 31.8 g/dL (ref 30.0–36.0)
MCV: 88.6 fL (ref 78.0–100.0)
Monocytes Absolute: 0.5 10*3/uL (ref 0.1–1.0)
Monocytes Relative: 7 %
Neutro Abs: 3.5 10*3/uL (ref 1.7–7.7)
Neutrophils Relative %: 50 %
Platelets: 215 10*3/uL (ref 150–400)
RBC: 4.4 MIL/uL (ref 3.87–5.11)
RDW: 12.9 % (ref 11.5–15.5)
WBC: 7 10*3/uL (ref 4.0–10.5)

## 2017-11-08 LAB — BASIC METABOLIC PANEL
Anion gap: 9 (ref 5–15)
BUN: 9 mg/dL (ref 6–20)
CO2: 22 mmol/L (ref 22–32)
Calcium: 8.7 mg/dL — ABNORMAL LOW (ref 8.9–10.3)
Chloride: 107 mmol/L (ref 98–111)
Creatinine, Ser: 0.71 mg/dL (ref 0.44–1.00)
GFR calc Af Amer: >60 mL/min (ref >60)
GFR calc non Af Amer: >60 mL/min (ref >60)
Glucose, Bld: 136 mg/dL — ABNORMAL HIGH (ref 70–99)
Potassium: 3.1 mmol/L — ABNORMAL LOW (ref 3.5–5.1)
Sodium: 138 mmol/L (ref 135–145)

## 2017-11-08 LAB — POC URINE PREG, ED: Preg Test, Ur: NEGATIVE

## 2017-11-08 IMAGING — RF DG ABDOMEN 2V
1 series · 9 of 9 positions shown · non-contrast
Comparison: None.

CLINICAL DATA: 36-year-old female with a history of foreign body
retrieval

EXAM:
DG C-ARM 61-120 MIN; ABDOMEN - 2 VIEW

[Series 1: run · 9 of 9 slices shown]
[im 1/9]
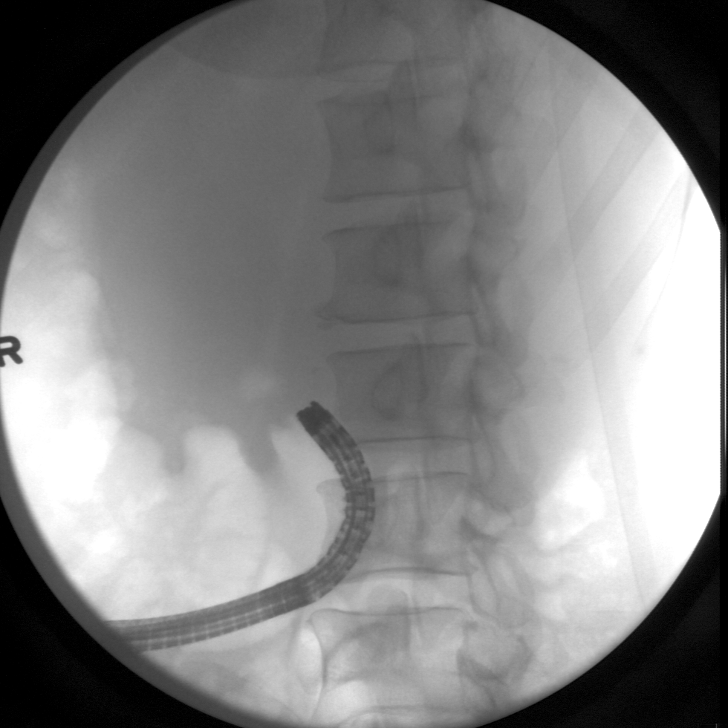
[im 2/9]
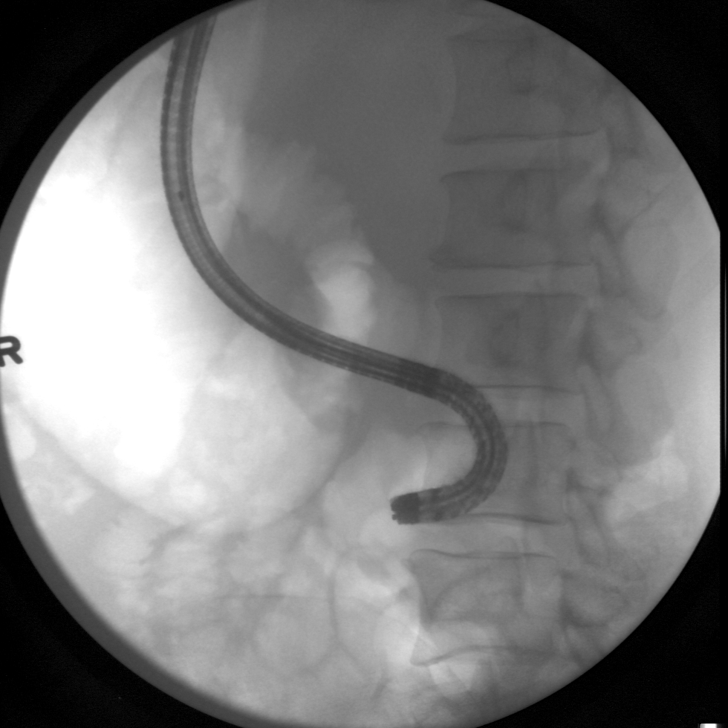
[im 3/9]
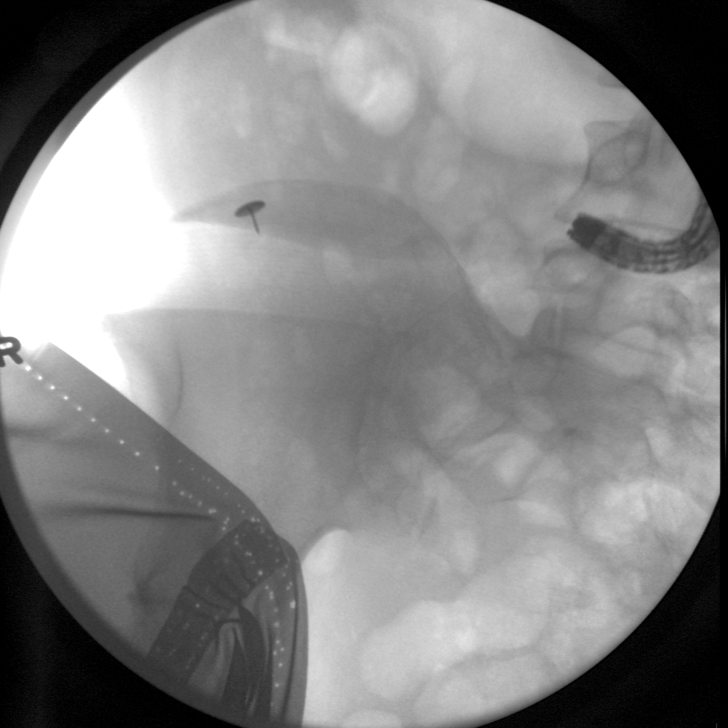
[im 4/9]
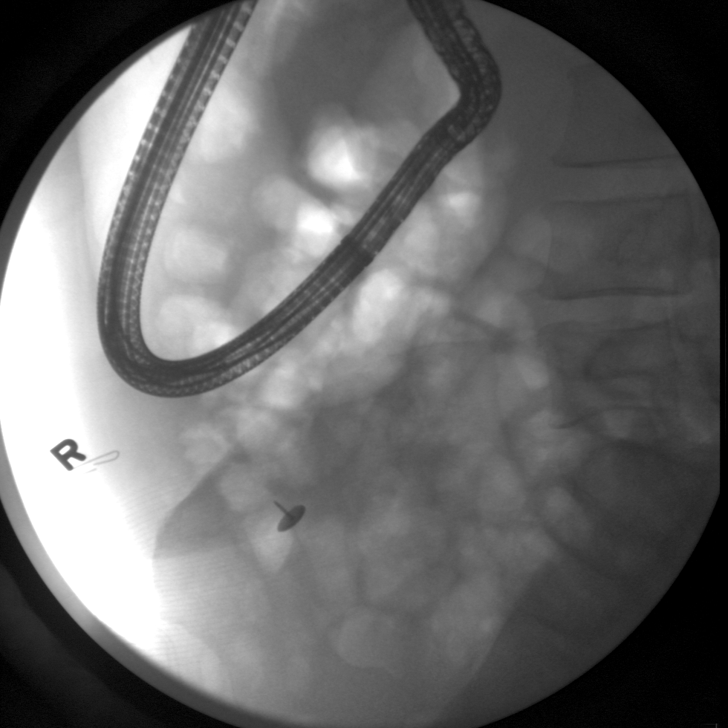
[im 5/9]
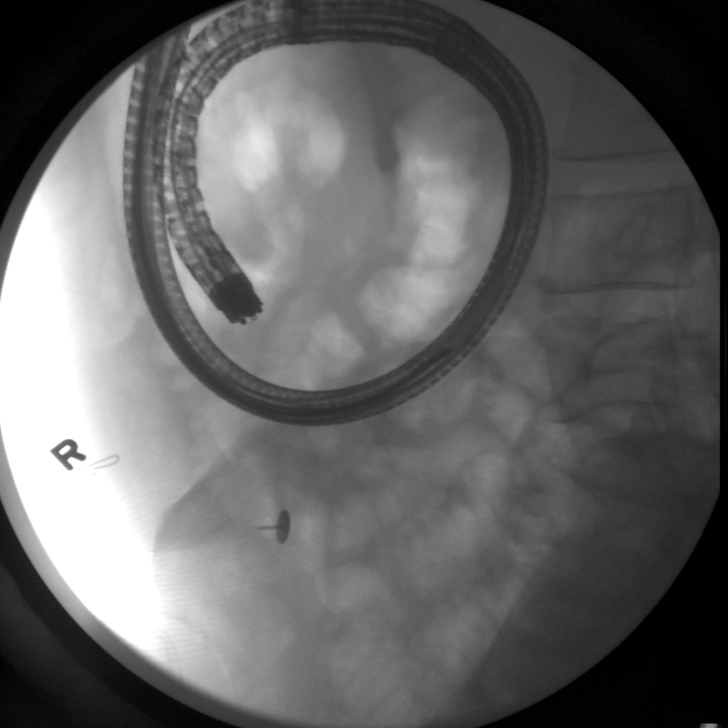
[im 6/9]
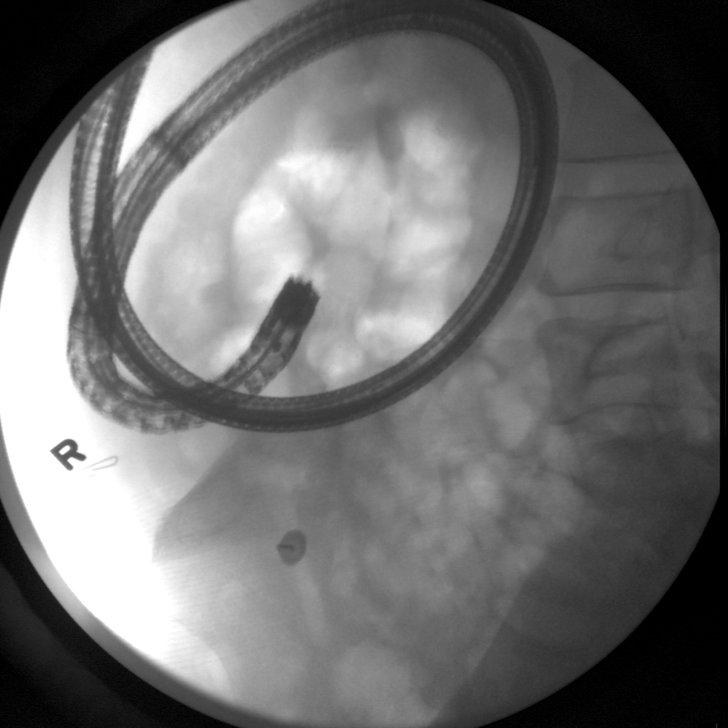
[im 7/9]
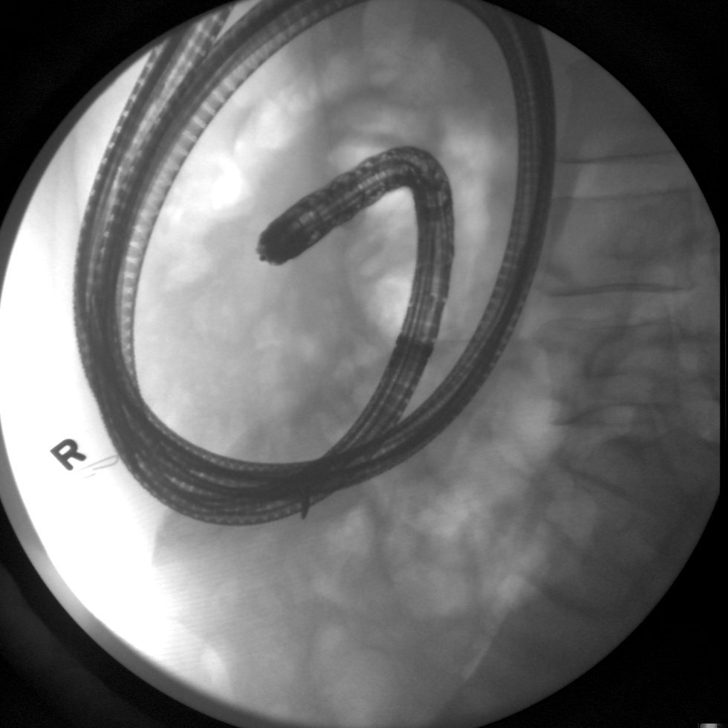
[im 8/9]
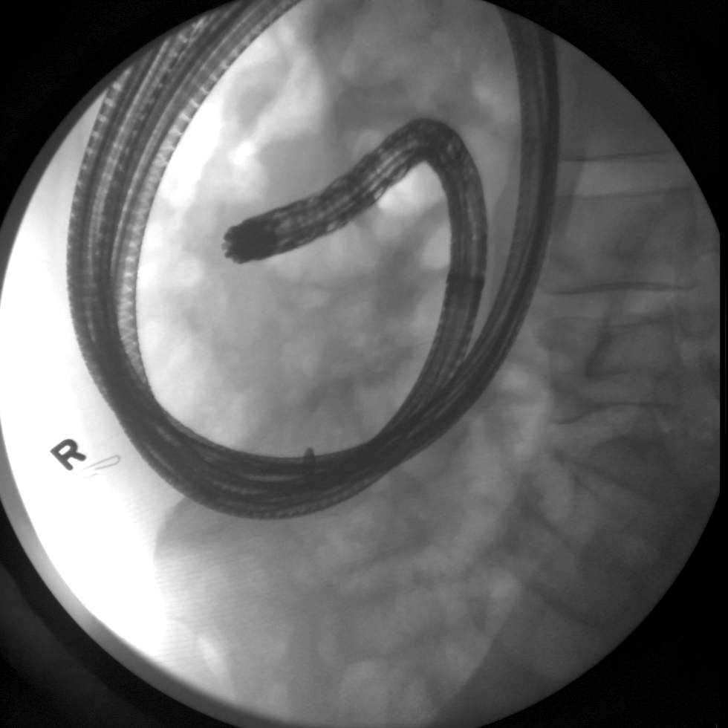
[im 9/9]
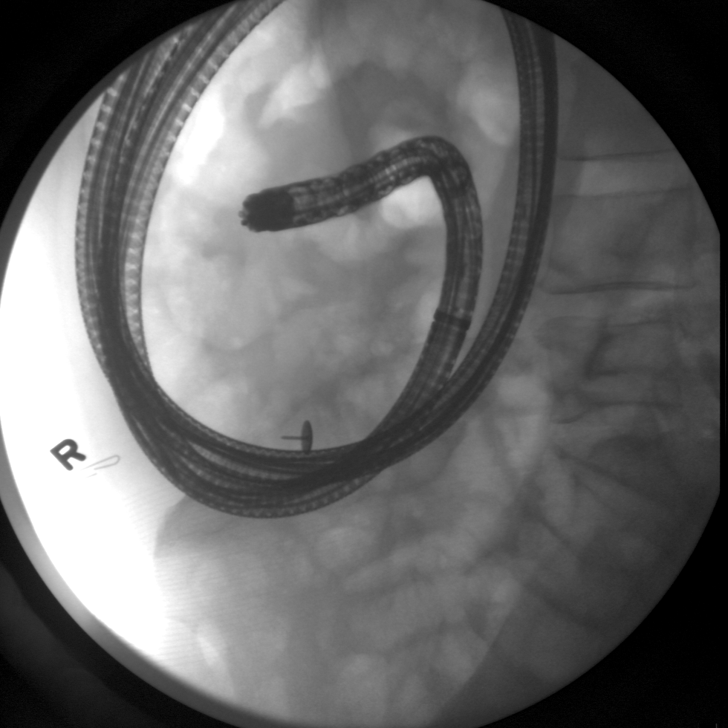

[9 of 9 positions shown; findings below may reference images not displayed]

FINDINGS: Limited intraoperative fluoroscopic spot images during ERCP.

Initial image demonstrates endoscope projecting over the upper
abdomen. Subsequently, images demonstrate attempted retrieval of
metallic foreign body from small bowel.
IMPRESSION: Limited intraoperative fluoroscopic spot images during ERCP and
attempt at foreign body retrieval.

## 2017-11-08 IMAGING — DX DG ABDOMEN 1V
1 series · 1 of 1 positions shown · non-contrast
Comparison: [DATE]

CLINICAL DATA: Swallowed thumbtack

EXAM:
ABDOMEN - 1 VIEW

[abdomen kub]
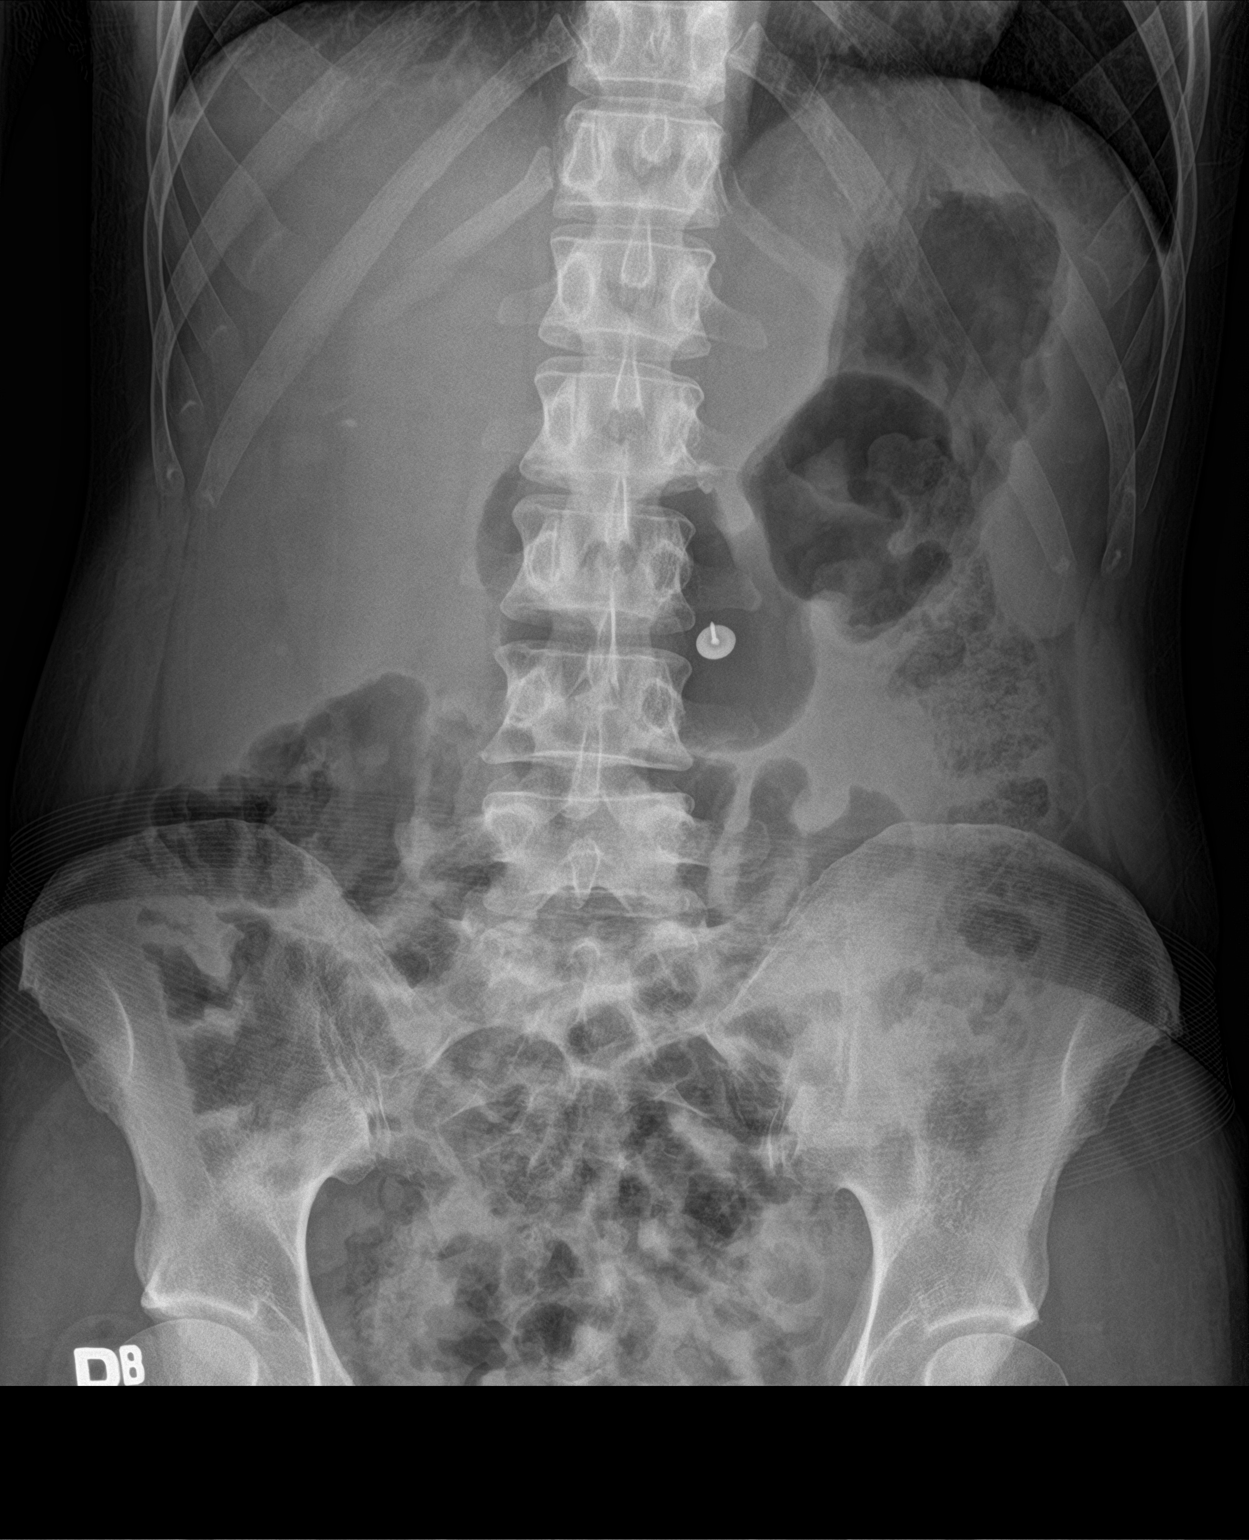

[1 of 1 positions shown; findings below may reference images not displayed]

FINDINGS: Scattered large and small bowel gas is noted. Recently ingested
foreign body lies within the distal aspect of the stomach. No acute
complications are identified at this time. No bony abnormality is
seen.
IMPRESSION: Ingested foreign body within the distal stomach.

## 2017-11-08 IMAGING — DX DG FINGER INDEX 2+V*L*
3 series · 3 of 3 positions shown · non-contrast
Comparison: None.

CLINICAL DATA: 36 y/o  F; laceration of the finger.

EXAM:
LEFT INDEX FINGER 2+V

[finger ap]
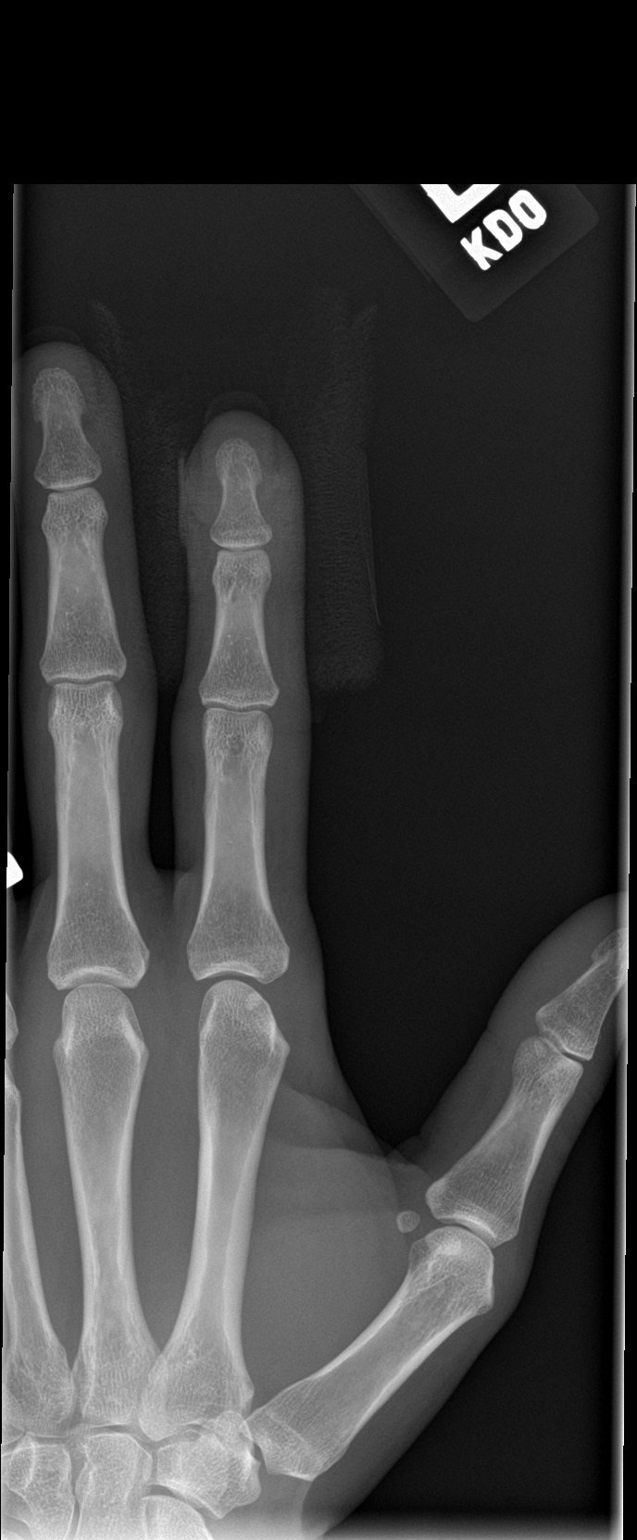

[finger obl]
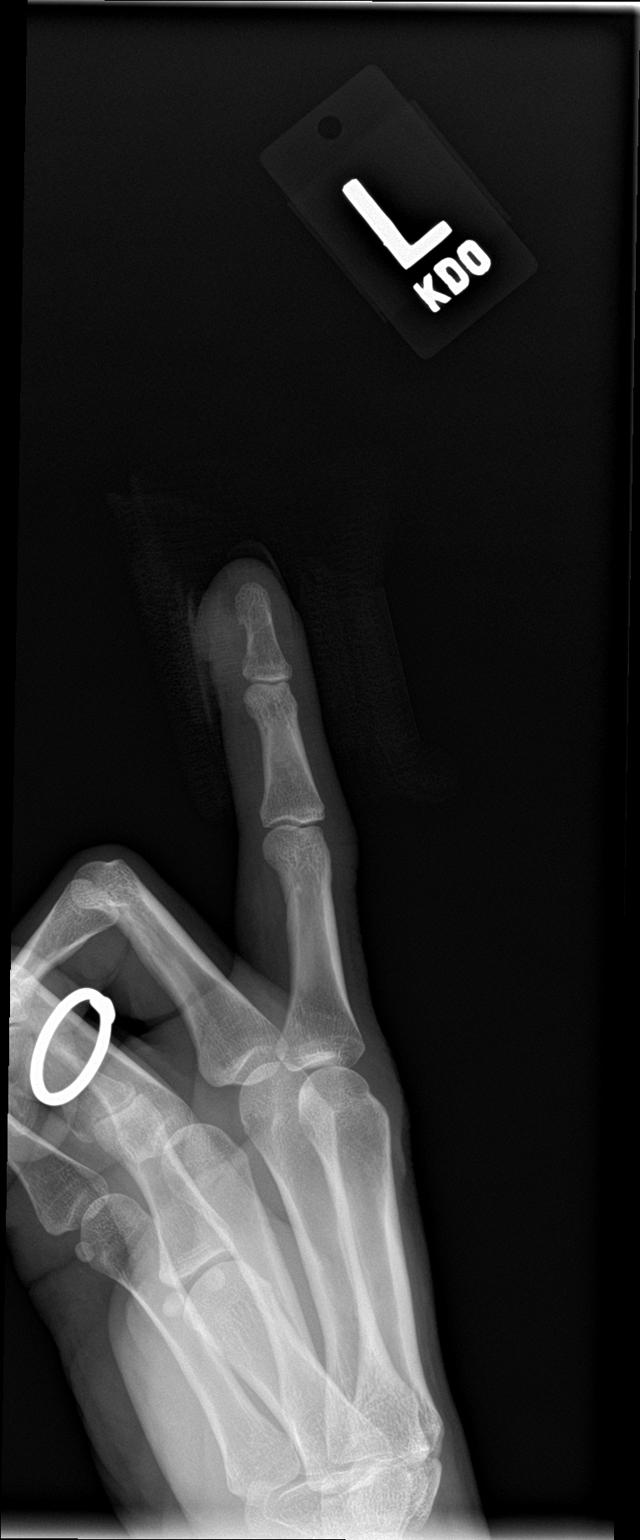

[finger lat]
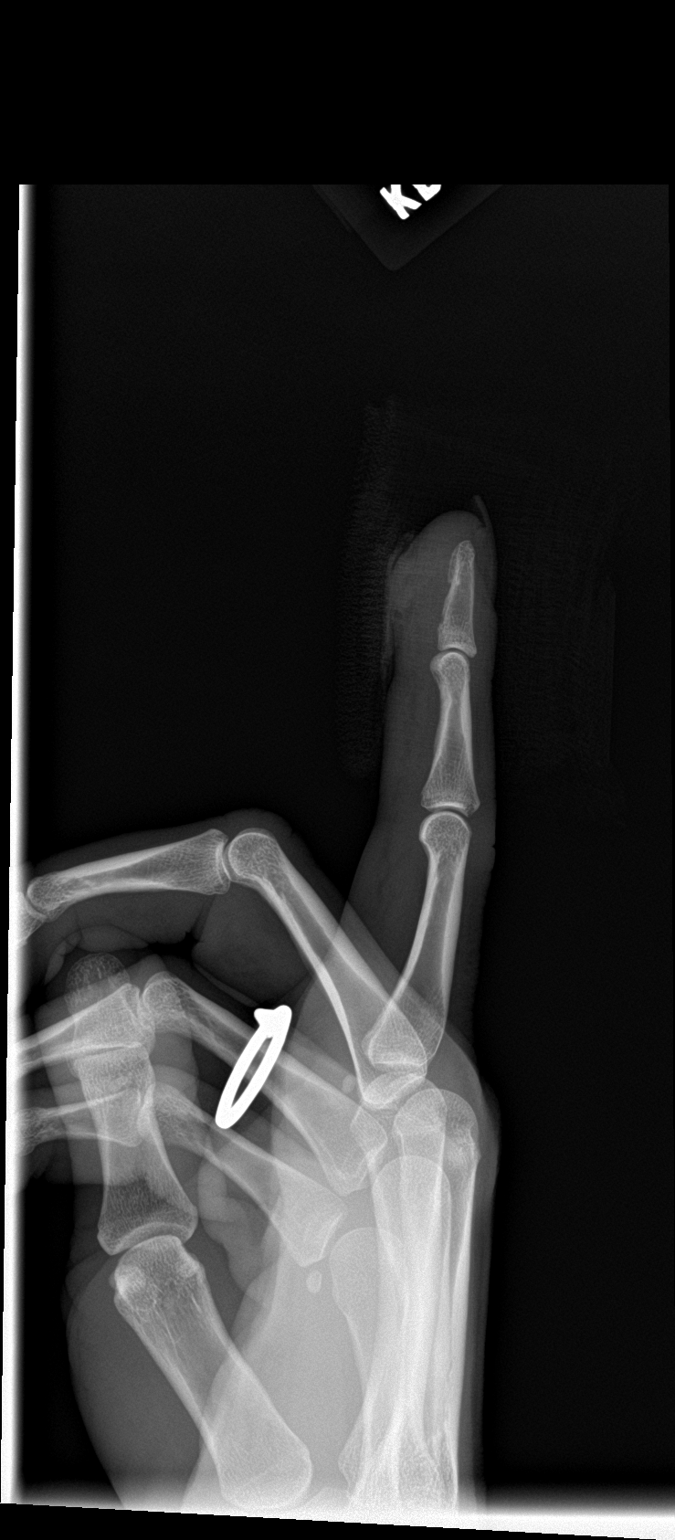

[3 of 3 positions shown; findings below may reference images not displayed]

FINDINGS: There is no evidence of fracture or dislocation. There is no
evidence of arthropathy or other focal bone abnormality. Laceration
of the distal second digit soft tissues.
IMPRESSION: Laceration of distal second digit soft tissues. No acute fracture or
dislocation identified.

By: TARASOFF M.D.

## 2017-11-08 SURGERY — ENTEROSCOPY
Anesthesia: Monitor Anesthesia Care

## 2017-11-08 MED ORDER — PROPOFOL 10 MG/ML IV BOLUS
INTRAVENOUS | Status: DC | PRN
  Administered 2017-11-08: 40 mg via INTRAVENOUS
  Administered 2017-11-08 (×2): 20 mg via INTRAVENOUS

## 2017-11-08 MED ORDER — SODIUM CHLORIDE 0.9 % IV SOLN
INTRAVENOUS | Status: DC | PRN
  Administered 2017-11-08: 12:00:00 via INTRAVENOUS

## 2017-11-08 MED ORDER — IBUPROFEN 400 MG PO TABS
600.0000 mg | ORAL_TABLET | Freq: Once | ORAL | Status: AC
  Administered 2017-11-08: 600 mg via ORAL
  Filled 2017-11-08: qty 1

## 2017-11-08 MED ORDER — PROPOFOL 500 MG/50ML IV EMUL
INTRAVENOUS | Status: DC | PRN
  Administered 2017-11-08: 13:00:00 via INTRAVENOUS
  Administered 2017-11-08: 100 ug/kg/min via INTRAVENOUS

## 2017-11-08 MED ORDER — TETANUS-DIPHTH-ACELL PERTUSSIS 5-2.5-18.5 LF-MCG/0.5 IM SUSP
0.5000 mL | Freq: Once | INTRAMUSCULAR | Status: AC
  Administered 2017-11-08: 0.5 mL via INTRAMUSCULAR
  Filled 2017-11-08: qty 0.5

## 2017-11-08 MED ORDER — SODIUM CHLORIDE 0.9 % IV SOLN
Freq: Once | INTRAVENOUS | Status: AC
  Administered 2017-11-08: 10:00:00 via INTRAVENOUS

## 2017-11-08 MED ORDER — IPRATROPIUM-ALBUTEROL 0.5-2.5 (3) MG/3ML IN SOLN
3.0000 mL | Freq: Once | RESPIRATORY_TRACT | Status: AC
  Administered 2017-11-08: 3 mL via RESPIRATORY_TRACT
  Filled 2017-11-08: qty 3

## 2017-11-08 MED ORDER — LIDOCAINE 2% (20 MG/ML) 5 ML SYRINGE
INTRAMUSCULAR | Status: DC | PRN
  Administered 2017-11-08: 40 mg via INTRAVENOUS

## 2017-11-08 MED ORDER — LIDOCAINE HCL (PF) 1 % IJ SOLN
5.0000 mL | Freq: Once | INTRAMUSCULAR | Status: AC
  Administered 2017-11-08: 5 mL
  Filled 2017-11-08: qty 5

## 2017-11-08 SURGICAL SUPPLY — 14 items

## 2017-11-08 NOTE — Anesthesia Procedure Notes (Signed)
Procedure Name: MAC Date/Time: 11/08/2017 12:06 PM Performed by: Colin Benton, CRNA Pre-anesthesia Checklist: Patient identified, Emergency Drugs available, Suction available and Patient being monitored Patient Re-evaluated:Patient Re-evaluated prior to induction Oxygen Delivery Method: Nasal cannula Preoxygenation: Pre-oxygenation with 100% oxygen Induction Type: IV induction Placement Confirmation: positive ETCO2 Dental Injury: Teeth and Oropharynx as per pre-operative assessment

## 2017-11-08 NOTE — Transfer of Care (Signed)
Immediate Anesthesia Transfer of Care Note  Patient: ANTARA BRECHEISEN  Procedure(s) Performed: ENTEROSCOPY (N/A )  Patient Location: Endoscopy Unit  Anesthesia Type:MAC  Level of Consciousness: drowsy  Airway & Oxygen Therapy: Patient Spontanous Breathing and Patient connected to nasal cannula oxygen  Post-op Assessment: Report given to RN and Post -op Vital signs reviewed and stable  Post vital signs: Reviewed and stable  Last Vitals:  Vitals Value Taken Time  BP    Temp    Pulse 92 11/08/2017 12:57 PM  Resp 21 11/08/2017 12:57 PM  SpO2 69 % 11/08/2017 12:57 PM  Vitals shown include unvalidated device data.  Last Pain:  Vitals:   11/08/17 1058  TempSrc: Oral  PainSc: 9       Patients Stated Pain Goal: 4 (65/53/74 8270)  Complications: No apparent anesthesia complications

## 2017-11-08 NOTE — ED Notes (Signed)
Patient transported to X-ray 

## 2017-11-08 NOTE — ED Triage Notes (Signed)
Pt c/o cut (with knife)  to left hand, 2nd digit.

## 2017-11-08 NOTE — ED Notes (Signed)
Pt in RR for sample

## 2017-11-08 NOTE — Discharge Instructions (Signed)
WOUND CARE Please have your stitches/staples removed in 7 or sooner if you have concerns. You may do this at any available urgent care or at your primary care doctor's office.  Keep area clean and dry for 24 hours. Do not remove bandage, if applied.  After 24 hours, remove bandage and wash wound gently with mild soap and warm water. Reapply a new bandage after cleaning wound, if directed.  Continue daily cleansing with soap and water until stitches/staples are removed.  Do not apply any ointments or creams to the wound while stitches/staples are in place, as this may cause delayed healing.  Seek medical careif you experience any of the following signs of infection: Swelling, redness, pus drainage, streaking, fever >101.0 F  Seek care if you experience excessive bleeding that does not stop after 15-20 minutes of constant, firm pressure.  YOU HAD AN ENDOSCOPIC PROCEDURE TODAY: Refer to the procedure report and other information in the discharge instructions given to you for any specific questions about what was found during the examination. If this information does not answer your questions, please call [place] office at [phone] to clarify.   YOU SHOULD EXPECT: Some feelings of bloating in the abdomen. Passage of more gas than usual. Walking can help get rid of the air that was put into your GI tract during the procedure and reduce the bloating. If you had a lower endoscopy (such as a colonoscopy or flexible sigmoidoscopy) you may notice spotting of blood in your stool or on the toilet paper. Some abdominal soreness may be present for a day or two, also.  DIET: Your first meal following the procedure should be a light meal and then it is ok to progress to your normal diet. A half-sandwich or bowl of soup is an example of a good first meal. Heavy or fried foods are harder to digest and may make you feel nauseous or bloated. Drink plenty of fluids but you should avoid alcoholic  beverages for 24 hours. If you had a esophageal dilation, please see attached instructions for diet.    ACTIVITY: Your care partner should take you home directly after the procedure. You should plan to take it easy, moving slowly for the rest of the day. You can resume normal activity the day after the procedure however YOU SHOULD NOT DRIVE, use power tools, machinery or perform tasks that involve climbing or major physical exertion for 24 hours (because of the sedation medicines used during the test).   SYMPTOMS TO REPORT IMMEDIATELY: A gastroenterologist can be reached at any hour. Please call [phone]  for any of the following symptoms:  Following lower endoscopy (colonoscopy, flexible sigmoidoscopy) Excessive amounts of blood in the stool  Significant tenderness, worsening of abdominal pains  Swelling of the abdomen that is new, acute  Fever of 100 or higher  Following upper endoscopy (EGD, EUS, ERCP, esophageal dilation) Vomiting of blood or coffee ground material  New, significant abdominal pain  New, significant chest pain or pain under the shoulder blades  Painful or persistently difficult swallowing  New shortness of breath  Black, tarry-looking or red, bloody stools  FOLLOW UP:  If any biopsies were taken you will be contacted by phone or by letter within the next 1-3 weeks. Call [phone]  if you have not heard about the biopsies in 3 weeks.  Please also call with any specific questions about appointments or follow up tests.

## 2017-11-08 NOTE — Op Note (Signed)
[facility] Patient Name: Kristin Hart Procedure Date : 11/08/2017 MRN: [id number] Attending MD: Wilhemina Bonito. Marina Goodell , MD Date of Birth: [birth year] CSN: [id number] Age: 37 Admit Type: Outpatient Procedure:                Upper GI endoscopy, push enteroscopy (small bowel                            endoscopy) Indications:              Foreign body in the stomach Providers:                Wilhemina Bonito. Marina Goodell, MD, Clearnce Sorrel, RN, Kandice Robinsons, Technician Referring MD:             [facility] Medicines:                Monitored Anesthesia Care Complications:            No immediate complications. Estimated Blood Loss:     Estimated blood loss: none. Procedure:                Pre-Anesthesia Assessment:                           - Prior to the procedure, a History and Physical                            was performed, and patient medications and                            allergies were reviewed. The patient's tolerance of                            previous anesthesia was also reviewed. The risks                            and benefits of the procedure and the sedation                            options and risks were discussed with the patient.                            All questions were answered, and informed consent                            was obtained. Prior Anticoagulants: The patient has                            taken no previous anticoagulant or antiplatelet                            agents. ASA Grade Assessment: I - A normal, healthy  patient. After reviewing the risks and benefits,                            the patient was deemed in satisfactory condition to                            undergo the procedure.                           After obtaining informed consent, the endoscope was                            passed under direct vision. Throughout the                            procedure, the patient's  blood pressure, pulse, and                            oxygen saturations were monitored continuously. The                            GIF-H190 ([id number] Olympus Adult EGD was introduced                            through the mouth, and advanced to the mid-jejunum.                            The upper GI endoscopy was accomplished without                            difficulty. The patient tolerated the procedure                            well. Scope In: Scope Out: Findings:      The esophagus was normal.      The stomach was normal. No foreign body.      The examined duodenum was normal. The enteroscope was advanced to the       mid small bowel. The foreign body was noted to be distal to this on the       fluoroscopy.      The cardia and gastric fundus were normal on retroflexion. Impression:               1. Normal upper endoscopy and small bowel                            enteroscopy to the mid jejunum                           2. The foreign body has passed to the distal small                            bowel on imaging. Recommendation:           1. Okay for discharge home with her family  2. She should have plain films of the abdomen about                            every 3 days to monitor the form body. Hopefully                            this will pass spontaneously. I will send this to                            GI nursing to arrange.                           3. Should the patient develop abdominal pain in the                            interim, she should return to the hospital as this                            may indicate injured viscous Procedure Code(s):        --- Professional ---                           [phone], Esophagogastroduodenoscopy, flexible,                            transoral; diagnostic, including collection of                            specimen(s) by brushing or washing, when performed                            (separate  procedure) Diagnosis Code(s):        --- Professional ---                           V25.3GUY, Foreign body in stomach, initial encounter CPT copyright 2017 American Medical Association. All rights reserved. The codes documented in this report are preliminary and upon coder review may  be revised to meet current compliance requirements. Wilhemina Bonito. Marina Goodell, MD 11/08/2017 1:04:36 PM This report has been signed electronically. Number of Addenda: 0

## 2017-11-08 NOTE — Anesthesia Postprocedure Evaluation (Signed)
Anesthesia Post Note  Patient: Kristin Hart  Procedure(s) Performed: ENTEROSCOPY (N/A )     Patient location during evaluation: PACU Anesthesia Type: MAC Level of consciousness: awake and alert Pain management: pain level controlled Vital Signs Assessment: post-procedure vital signs reviewed and stable Respiratory status: spontaneous breathing, nonlabored ventilation, respiratory function stable and patient connected to nasal cannula oxygen Cardiovascular status: stable and blood pressure returned to baseline Postop Assessment: no apparent nausea or vomiting Anesthetic complications: no    Last Vitals:  Vitals:   11/08/17 1310 11/08/17 1330  BP: (!) 141/95 (!) 143/104  Pulse: 88 96  Resp: 15 17  Temp:    SpO2: 100% 100%    Last Pain:  Vitals:   11/08/17 1330  TempSrc:   PainSc: 0-No pain                 Holden Draughon S

## 2017-11-08 NOTE — Consult Note (Addendum)
[facility] Consult: 8:16 AM 11/08/2017  LOS: 0 days    Referring Provider: Jaynie Crumble PA-C in ED Primary Care Physician:  Ellwood Dense, DO Primary Gastroenterologist:  Gentry Fitz.      Reason for Consultation:  Swallowed thumbtack on Wednesday night.   HPI: Kristin Hart is a 37 y.o. female.  PMH list below.     Swallowed thumb tack 2 d ago.  She was hanging Christmas type lights in her home and had a thumbtack in her mouth she was using to pin up the lights.  Her friend started speaking to her and she turned around and all of a sudden she had swallowed a thumbtack.  She felt to go down and cause a little bit of discomfort of the epigastric region for a few seconds but after that it was fine.  She is been eating okay since then.  There is been no abdominal pain, fevers, nausea.  Patient lacerated her finger while trying to hang curtains last night and came to the ED to have this sutured.  KUB was obtained and shows the thumbtack in distal stomach on this AMs xray.    Patient is never had an EGD or colonoscopy.  She does not have any GI issues either acutely or chronically.  Details as to the diagnosis of peptic ulcer disease are unclear. Labs noteable for K 3.2.  Glucose 143.  O/w normal CBC and CMET.    Family history positive for cholecystectomy in her mother and GERD in her dad.  Past Medical History:  Diagnosis Date  . Asthma   . 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 Diabetes mellitus without complication (HCC)    type 2  . Hypothyroidism   . Stomach ulcer     Past Surgical History:  Procedure Laterality Date  . CESAREAN SECTION N/A 07/04/2016   Procedure: CESAREAN SECTION;  Surgeon: [name], DO;  Location: [facility] BIRTHING SUITES;   Service: Obstetrics;  Laterality: N/A;  . NO PAST SURGERIES      Prior to Admission medications   Medication Sig Start Date End Date Taking? Authorizing Provider  albuterol (PROAIR HFA) 108 (90 Base) MCG/ACT inhaler INHALE 2 PUFFS INTO THE LUNGS EVERY 6 HOURS AS NEEDED FOR WHEEZING/SHORTNESS OF BREATH 06/11/16   [provider]  amoxicillin (AMOXIL) 500 MG capsule Take 1 capsule (500 mg total) by mouth 3 (three) times daily. 05/16/17   Janne Napoleon, NP  cetirizine (ZYRTEC) 10 MG tablet Take 10 mg by mouth daily. 06/29/16   [provider]  doxycycline (VIBRAMYCIN) 100 MG capsule Take 1 capsule (100 mg total) by mouth 2 (two) times daily. 09/19/17   Hedges, Tinnie Gens, PA-C  fluconazole (DIFLUCAN) 150 MG tablet Take 1 tablet (150 mg total) by mouth daily. 09/29/17   Elson Areas, PA-C  guaiFENesin (MUCINEX) 600 MG 12 hr tablet Take 1 tablet (600 mg total) by mouth 2 (two) times daily. 08/21/16   Aviva Signs, CNM  ibuprofen (ADVIL,MOTRIN) 600 MG tablet Take 1  tablet (600 mg total) by mouth every 6 (six) hours as needed. Patient not taking: Reported on 05/30/2017 07/07/16   Arabella Merles, CNM  metroNIDAZOLE (FLAGYL) 500 MG tablet Take 1 tablet (500 mg total) by mouth 2 (two) times daily. 09/29/17   Elson Areas, PA-C  naproxen (NAPROSYN) 375 MG tablet Take 1 tablet (375 mg total) by mouth 2 (two) times daily. 05/16/17   Janne Napoleon, NP  potassium chloride 20 MEQ TBCR Take 20 mEq by mouth daily. 09/19/17   Hedges, Tinnie Gens, PA-C  Prenat-FeCbn-FeAspGl-FA-Omega (OB COMPLETE PETITE) 35-5-1-200 MG CAPS Take 1 tablet by mouth daily. Patient not taking: Reported on 08/21/2016 06/11/16   [name], DO  Vitamin D, Ergocalciferol, (DRISDOL) 50000 units CAPS capsule Take 1 capsule (50,000 Units total) by mouth every 7 (seven) days. Patient not taking: Reported on 05/30/2017 03/15/16   Orvilla Cornwall A, CNM    Scheduled Meds:  Infusions:  PRN Meds:    Allergies as of 11/08/2017    . (No Known Allergies)    No family history on file.  Social History   Socioeconomic History  . Marital status: Single    Spouse name: Not on file  . Number of children: Not on file  . Years of education: Not on file  . Highest education level: Not on file  Occupational History  . Not on file  Social Needs  . Financial resource strain: Not on file  . Food insecurity:    Worry: Not on file    Inability: Not on file  . Transportation needs:    Medical: Not on file    Non-medical: Not on file  Tobacco Use  . Smoking status: Never Smoker  . Smokeless tobacco: Never Used  Substance and Sexual Activity  . Alcohol use: No  . Drug use: Yes    Types: Methamphetamines    Comment: last used in Aug/Sept??  . Sexual activity: Yes  Lifestyle  . Physical activity:    Days per week: Not on file    Minutes per session: Not on file  . Stress: Not on file  Relationships  . Social connections:    Talks on phone: Not on file    Gets together: Not on file    Attends religious service: Not on file    Active member of club or organization: Not on file    Attends meetings of clubs or organizations: Not on file    Relationship status: Not on file  . Intimate partner violence:    Fear of current or ex partner: Not on file    Emotionally abused: Not on file    Physically abused: Not on file    Forced sexual activity: Not on file  Other Topics Concern  . Not on file  Social History Narrative  . Not on file    REVIEW OF SYSTEMS: Constitutional: No weakness, no dizziness. ENT:  No nose bleeds Pulm: Difficulty breathing or cough. CV:  No palpitations, no LE edema.  Chest pain. GU:  No hematuria, no frequency GI: Her HPI. Heme: No excessive or unusual bleeding.  Her finger laceration has been bleeding. Transfusions: Prior transfusions. Neuro:  No headaches, no peripheral tingling or numbness Derm:  No itching, no rash or sores.  Endocrine:  No sweats or chills.  No polyuria or  dysuria Immunization: Not queried.    PHYSICAL EXAM: Vital signs in last 24 hours: Vitals:   11/08/17 0124  BP: 136/89  Pulse: 96  Resp:  18  Temp: 98.6 F (37 C)  SpO2: 98%   Wt Readings from Last 3 Encounters:  11/08/17 55.8 kg  10/23/17 58.1 kg  09/29/17 63.5 kg    General: Slightly pale but well-appearing WF.  She is slightly anxious and looks like she is been crying because her eyes are red. Head: No facial asymmetry or swelling.  No signs of head trauma. Eyes: No scleral icterus.  No conjunctival pallor.  She is got erythema around her lower lids and her sclera are slightly injected consistent with crying. Ears: Not hard of hearing Nose: Congestion or discharge. Mouth: Pharynx moist, clear, pink.  Tongue midline. Neck: JVD, no masses, no thyromegaly. Lungs: Labored breathing or cough.  Clear bilaterally. Heart: RRR.  No MRG.  S1, S2 present. Abdomen: Soft.  Not tender or distended.  Active bowel sounds.  No hernias, masses, bruits..   Rectal: Deferred Musc/Skeltl: Joint erythema, redness or swelling. Extremities: Lacerated finger on the left hand index finger.  There is some blood on the gauze bandage. Neurologic: Alert.  Oriented x3.  No gross deficits, full limb strength. Skin: No rashes, no sores. Tattoos: Special quality tattoos on her arm. Nodes: No cervical adenopathy. Psych: Operative, pleasant, fluid speech.  Intake/Output from previous day: No intake/output data recorded. Intake/Output this shift: No intake/output data recorded.  LAB RESULTS: No results for input(s): WBC, HGB, HCT, PLT in the last 72 hours. BMET Lab Results  Component Value Date   NA 142 09/19/2017   NA 138 07/25/2016   NA 136 07/03/2016   K 3.2 (L) 09/19/2017   K 3.4 (L) 07/25/2016   K 4.1 07/03/2016   CL 110 09/19/2017   CL 105 07/25/2016   CL 104 07/03/2016   CO2 21 (L) 09/19/2017   CO2 21 (L) 07/25/2016   CO2 23 07/03/2016   GLUCOSE 143 (H) 09/19/2017   GLUCOSE 191 (H)  07/25/2016   GLUCOSE 123 (H) 07/03/2016   BUN 13 09/19/2017   BUN 13 07/25/2016   BUN 19 07/03/2016   CREATININE 0.94 09/19/2017   CREATININE 0.85 07/25/2016   CREATININE 0.76 07/03/2016   CALCIUM 9.7 09/19/2017   CALCIUM 9.1 07/25/2016   CALCIUM 9.4 07/03/2016   LFT No results for input(s): PROT, ALBUMIN, AST, ALT, ALKPHOS, BILITOT, BILIDIR, IBILI in the last 72 hours. PT/INR No results found for: INR, PROTIME Hepatitis Panel No results for input(s): HEPBSAG, HCVAB, HEPAIGM, HEPBIGM in the last 72 hours. C-Diff No components found for: CDIFF Lipase     Component Value Date/Time   LIPASE 25 05/02/2015 0323    Drugs of Abuse     Component Value Date/Time   COCAINSCRNUR Negative 06/04/2016 1552     RADIOLOGY STUDIES: Dg Abdomen 1 View  Result Date: 11/08/2017 CLINICAL DATA:  Swallowed thumbtack EXAM: ABDOMEN - 1 VIEW COMPARISON:  08/12/2013 FINDINGS: Scattered large and small bowel gas is noted. Recently ingested foreign body lies within the distal aspect of the stomach. No acute complications are identified at this time. No bony abnormality is seen. IMPRESSION: Ingested foreign body within the distal stomach. Electronically Signed   By: Alcide CleverMark  Lukens M.D.   On: 11/08/2017 07:38   Dg Finger Index Left  Result Date: 11/08/2017 CLINICAL DATA:  37 y/o  F; laceration of the finger. EXAM: LEFT INDEX FINGER 2+V COMPARISON:  None. FINDINGS: There is no evidence of fracture or dislocation. There is no evidence of arthropathy or other focal bone abnormality. Laceration of the distal second digit soft tissues. IMPRESSION:  Laceration of distal second digit soft tissues. No acute fracture or dislocation identified. Electronically Signed   By: Mitzi Hansen M.D.   On: 11/08/2017 06:12    IMPRESSION:   *   Ingested foreign body.  Inadvertently swallowed a thumbtack Wednesday night.  On x-ray here, Friday morning, this is in the distal stomach.  *    Laceration left index  finger.  *    Hyperglycemia and hypokalemia.   PLAN:     *    Patient is set up for upper endoscopy with removal of the thumbtack with Dr. Marina Goodell today.  Discussed the procedure with the patient and possible complications including perforation and inability to remove the thumbtack.  She understands and is agreeable to proceed.  Jennye Moccasin  11/08/2017, 8:16 AM Phone [phone]  GI ATTENDING  History, laboratories, x-rays reviewed. Patient personally seen and examined. Agree with above, perhaps of consultation note. The patient has ingested a sharp foreign body which resides in the stomach on x-ray. Plans for urgent endoscopy today. The patient is high-risk given the nature of the nature of the foreign body.The nature of the procedure, as well as the risks, benefits, and alternatives were carefully and thoroughly reviewed with the patient. Ample time for discussion and questions allowed. The patient understood, was satisfied, and agreed to proceed.  Wilhemina Bonito. Eda Keys., M.D. [facility] Division of Gastroenterology

## 2017-11-08 NOTE — ED Provider Notes (Signed)
7:31 AM Pt signed out to me at shift change. Pt initially presented with laceration to the left index finger. Laceration was repaired by previous provider. Upon further interview with patient, she admitted to accidentally swallowing a thumb tack two days ago. KUB was obtained to see the location of the thumb tack. She denies any abdominal pain, n/v, hematemesis, black or bloody stools.   9:07 AM I have spoken with gastroenterology, they will schedule patient for endoscopy around noon today.  They will come by and see her.  Ordered basic labs and IV fluids.  Patient is n.p.o.  She is also now having some wheezing, states that she is having asthma attack.  She is wheezing on the exam, expiratory wheezes in all lung fields.  Denies any abdominal pain.  I will give her a breathing treatment.  Pt feels better after breathing treatment.   Went to endoscopy. NAD. VS normal. Procedure and plan as well as dc instructions regarding finger laceration discussed.   Vitals:   11/08/17 1058 11/08/17 1304 11/08/17 1310 11/08/17 1330  BP: (!) 154/97 138/78 (!) 141/95 (!) 143/104  Pulse: 91 93 88 96  Resp: 12 17 15 17   Temp: 98.6 F (37 C) 98.2 F (36.8 C)    TempSrc: Oral Oral    SpO2: 100% 100% 100% 100%  Weight:          Jaynie CrumbleKirichenko, Delisha Peaden, PA-C 11/08/17 1451    Arby BarrettePfeiffer, Marcy, MD 11/25/17 1407

## 2017-11-08 NOTE — ED Provider Notes (Signed)
[facility] EMERGENCY DEPARTMENT Provider Note   CSN: [id number] Arrival date & time: 11/08/17  0118     History   Chief Complaint Chief Complaint  Patient presents with  . Laceration    HPI Kristin Hart is a 37 y.o. female.  HPI Patient presents to the ED for evaluation following a finger laceration.  She states that prior to arrival she was using a knife and cut her left index finger.  She denies any paresthesias or weakness.  She does report pain to the area that is worse with palpation or range of motion.  She states that her tetanus shot is not up-to-date.  Denies any other associated symptoms. Past Medical History:  Diagnosis Date  . Asthma   . 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 Diabetes mellitus without complication (HCC)    type 2  . Hypothyroidism   . Psychiatric disorder   . Stomach ulcer     Patient Active Problem List   Diagnosis Date Noted  . Anxiety 05/29/2017  . Self-mutilation 05/29/2017  . Depression 06/07/2016  . Marijuana use 06/07/2016  . Low vitamin D level 03/15/2016  . Diabetes mellitus affecting pregnancy 02/22/2016  . Hypothyroidism 02/22/2016  . Asthma 02/22/2016  . ADD (attention deficit disorder) 02/22/2016  . 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 Hyperlipidemia with target LDL less than 70 01/31/2012  . Migraine 01/31/2012  . Tremor, 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    Past Surgical History:  Procedure Laterality Date  . CESAREAN SECTION N/A 07/04/2016   Procedure: CESAREAN SECTION;  Surgeon: [name], DO;  Location: [facility] BIRTHING SUITES;  Service: Obstetrics;  Laterality: N/A;  . NO PAST SURGERIES       OB History    Gravida  1   Para  1   Term  1   Preterm      AB      Living        SAB      TAB      Ectopic      Multiple  0   Live Births               Home Medications    Prior to Admission medications   Medication Sig Start Date End  Date Taking? Authorizing Provider  albuterol (PROAIR HFA) 108 (90 Base) MCG/ACT inhaler INHALE 2 PUFFS INTO THE LUNGS EVERY 6 HOURS AS NEEDED FOR WHEEZING/SHORTNESS OF BREATH 06/11/16   [provider]  amoxicillin (AMOXIL) 500 MG capsule Take 1 capsule (500 mg total) by mouth 3 (three) times daily. 05/16/17   Janne Napoleon, NP  cetirizine (ZYRTEC) 10 MG tablet Take 10 mg by mouth daily. 06/29/16   [provider]  doxycycline (VIBRAMYCIN) 100 MG capsule Take 1 capsule (100 mg total) by mouth 2 (two) times daily. 09/19/17   Hedges, Tinnie Gens, PA-C  fluconazole (DIFLUCAN) 150 MG tablet Take 1 tablet (150 mg total) by mouth daily. 09/29/17   Elson Areas, PA-C  guaiFENesin (MUCINEX) 600 MG 12 hr tablet Take 1 tablet (600 mg total) by mouth 2 (two) times daily. 08/21/16   Aviva Signs, CNM  ibuprofen (ADVIL,MOTRIN) 600 MG tablet Take 1 tablet (600 mg total) by mouth every 6 (six) hours as needed. Patient not taking: Reported on 05/30/2017 07/07/16   Arabella Merles, CNM  metroNIDAZOLE (FLAGYL) 500 MG tablet Take 1 tablet (500 mg total) by mouth 2 (two) times daily.  09/29/17   Elson Areas, PA-C  naproxen (NAPROSYN) 375 MG tablet Take 1 tablet (375 mg total) by mouth 2 (two) times daily. 05/16/17   Janne Napoleon, NP  potassium chloride 20 MEQ TBCR Take 20 mEq by mouth daily. 09/19/17   Hedges, Tinnie Gens, PA-C  Prenat-FeCbn-FeAspGl-FA-Omega (OB COMPLETE PETITE) 35-5-1-200 MG CAPS Take 1 tablet by mouth daily. Patient not taking: Reported on 08/21/2016 06/11/16   [name], DO  Vitamin D, Ergocalciferol, (DRISDOL) 50000 units CAPS capsule Take 1 capsule (50,000 Units total) by mouth every 7 (seven) days. Patient not taking: Reported on 05/30/2017 03/15/16   Roe Coombs, CNM    Family History No family history on file.  Social History Social History   Tobacco Use  . Smoking status: Never Smoker  . Smokeless tobacco: Never Used  Substance Use Topics  . Alcohol use: No  . Drug  use: Yes    Types: Methamphetamines    Comment: last used in Aug/Sept??     Allergies   Patient has no known allergies.   Review of Systems Review of Systems  Musculoskeletal: Positive for arthralgias and myalgias.  Skin: Positive for wound.  Neurological: Negative for weakness and numbness.     Physical Exam Updated Vital Signs BP 136/89 (BP Location: Right Arm)   Pulse 96   Temp 98.6 F (37 C) (Oral)   Resp 18   Wt 55.8 kg   SpO2 98%   BMI 19.26 kg/m   Physical Exam  Constitutional: She appears well-developed and well-nourished. No distress.  HENT:  Head: Normocephalic and atraumatic.  Eyes: Right eye exhibits no discharge. Left eye exhibits no discharge. No scleral icterus.  Neck: Normal range of motion.  Pulmonary/Chest: No respiratory distress.  Musculoskeletal: Normal range of motion.  She has full range of motion of the PIP, DIP and MCP joint of the second digit left hand.  Strength is normal.  Brisk cap refill.  Sensation intact.  Radial pulses 2+ bilaterally.  Neurological: She is alert.  Skin: Skin is warm and dry. Capillary refill takes less than 2 seconds. No pallor.  Patient has approximately 2 cm laceration to the pad of the left index finger.  There is some bleeding noted.  No significant debris noted.  There is no nail involvement.  Psychiatric: Her behavior is normal. Judgment and thought content normal.  Nursing note and vitals reviewed.    ED Treatments / Results  Labs (all labs ordered are listed, but only abnormal results are displayed) Labs Reviewed  POC URINE PREG, ED    EKG None  Radiology Dg Finger Index Left  Result Date: 11/08/2017 CLINICAL DATA:  37 y/o  F; laceration of the finger. EXAM: LEFT INDEX FINGER 2+V COMPARISON:  None. FINDINGS: There is no evidence of fracture or dislocation. There is no evidence of arthropathy or other focal bone abnormality. Laceration of the distal second digit soft tissues. IMPRESSION: Laceration  of distal second digit soft tissues. No acute fracture or dislocation identified. Electronically Signed   By: Mitzi Hansen M.D.   On: 11/08/2017 06:12    Procedures .Marland KitchenLaceration Repair Date/Time: 11/08/2017 6:39 AM Performed by: Rise Mu, PA-C Authorized by: Rise Mu, PA-C   Consent:    Consent obtained:  Verbal   Consent given by:  Patient   Risks discussed:  Infection, need for additional repair, nerve damage, poor cosmetic result, pain, poor wound healing, vascular damage, tendon damage and retained foreign body   Alternatives discussed:  No treatment Anesthesia (see MAR for exact dosages):    Anesthesia method:  Nerve block   Block needle gauge:  27 G   Block anesthetic:  Lidocaine 1% w/o epi   Block injection procedure:  Anatomic landmarks identified, anatomic landmarks palpated, negative aspiration for blood, introduced needle and incremental injection   Block outcome:  Anesthesia achieved Laceration details:    Location:  Finger   Finger location:  L index finger   Length (cm):  2 Pre-procedure details:    Preparation:  Patient was prepped and draped in usual sterile fashion and imaging obtained to evaluate for foreign bodies Exploration:    Hemostasis achieved with:  Direct pressure   Wound exploration: wound explored through full range of motion and entire depth of wound probed and visualized     Wound extent: no foreign bodies/material noted, no tendon damage noted and no underlying fracture noted     Contaminated: no   Treatment:    Area cleansed with:  Betadine and saline   Amount of cleaning:  Standard   Irrigation solution:  Sterile saline   Irrigation volume:  100   Irrigation method:  Pressure wash   Visualized foreign bodies/material removed: no   Skin repair:    Repair method:  Sutures   Suture size:  4-0   Suture material:  Prolene   Suture technique:  Simple interrupted   Number of sutures:  7 Approximation:     Approximation:  Close Post-procedure details:    Dressing:  Antibiotic ointment, non-adherent dressing and splint for protection   Patient tolerance of procedure:  Tolerated well, no immediate complications   (including critical care time)  Medications Ordered in ED Medications  lidocaine (PF) (XYLOCAINE) 1 % injection 5 mL (5 mLs Infiltration Given 11/08/17 0538)  Tdap (BOOSTRIX) injection 0.5 mL (0.5 mLs Intramuscular Given 11/08/17 0618)  lidocaine (PF) (XYLOCAINE) 1 % injection 5 mL (5 mLs Infiltration Given 11/08/17 0556)     Initial Impression / Assessment and Plan / ED Course  I have reviewed the triage vital signs and the nursing notes.  Pertinent labs & imaging results that were available during my care of the patient were reviewed by me and considered in my medical decision making (see chart for details).     Patient presents to the emergency department today for evaluation of laceration to the left index finger.  She is neurovascularly intact.  No nailbed involvement.  X-rays reassuring.  Patient's wound was irrigated, cleaned and sutured.  Tetanus shot was updated.  While patient was going to x-ray she told x-ray tech that she cannot get an x-ray.  She states that she bought a thumbtack 2 days ago.  On further assessment patient states that she was hanging something up and had a thumbtack in her mouth and a friend made her laugh and she swallowed the thumbtack.  She has denied any abdominal pain.  Denies any bloody stools or hematemesis.  Patient has no focal abdominal pain.  No peritoneal signs..  Will obtain KUB.   Care handoff to PA Kirichenko. Pt has pending at this time kub and reassessment. . Care dicussed and plan agreed upon with oncoming PA. Pt updated on plan of care and is currently hemodynamically stable at this time with normal vs.     Final Clinical Impressions(s) / ED Diagnoses   Final diagnoses:  Laceration of left index finger without foreign body without  damage to nail, initial encounter  Foreign body in stomach, initial  encounter    ED Discharge Orders    None       Wallace KellerLeaphart, Kenneth T, PA-C 11/08/17 2155    Zadie RhineWickline, Donald, MD 11/09/17 [phone]

## 2017-11-08 NOTE — Anesthesia Preprocedure Evaluation (Signed)
Anesthesia Evaluation  Patient identified by MRN, date of birth, ID band Patient awake    Reviewed: Allergy & Precautions, NPO status , Patient's Chart, lab work & pertinent test results  Airway Mallampati: II  TM Distance: >3 FB Neck ROM: Full    Dental no notable dental hx.    Pulmonary neg pulmonary ROS,    Pulmonary exam normal breath sounds clear to auscultation       Cardiovascular negative cardio ROS Normal cardiovascular exam Rhythm:Regular Rate:Normal     Neuro/Psych Anxiety Depression negative neurological ROS     GI/Hepatic negative GI ROS, Neg liver ROS,   Endo/Other  diabetesHypothyroidism   Renal/GU negative Renal ROS  negative genitourinary   Musculoskeletal negative musculoskeletal ROS (+)   Abdominal   Peds negative pediatric ROS (+)  Hematology negative hematology ROS (+)   Anesthesia Other Findings   Reproductive/Obstetrics negative OB ROS                             Anesthesia Physical Anesthesia Plan  ASA: III  Anesthesia Plan: MAC   Post-op Pain Management:    Induction: Intravenous  PONV Risk Score and Plan: 0  Airway Management Planned:   Additional Equipment:   Intra-op Plan:   Post-operative Plan:   Informed Consent: I have reviewed the patients History and Physical, chart, labs and discussed the procedure including the risks, benefits and alternatives for the proposed anesthesia with the patient or authorized representative who has indicated his/her understanding and acceptance.   Dental advisory given  Plan Discussed with: CRNA and Surgeon  Anesthesia Plan Comments:         Anesthesia Quick Evaluation

## 2017-11-10 ENCOUNTER — Encounter (HOSPITAL_COMMUNITY): Payer: Self-pay | Admitting: Internal Medicine

## 2017-11-12 ENCOUNTER — Telehealth: Payer: Self-pay

## 2017-11-12 DIAGNOSIS — T183XXS Foreign body in small intestine, sequela: Secondary | ICD-10-CM

## 2017-11-12 NOTE — Telephone Encounter (Signed)
Order entered for KUB Left message for patient to call back

## 2017-11-12 NOTE — Telephone Encounter (Signed)
-----   Message from Hilarie Fredrickson, MD sent at 11/12/2017  4:15 PM EDT ----- Regarding: FW: follow-up foreign body Please arrange plain films of the abdomen in this patient to see if her foreign body (thumbtack) has passed. She can have plain films of the abdomen tomorrow. Thank you ----- Message ----- From: Hilarie Fredrickson, MD Sent: 11/08/2017   1:55 PM EDT To: Hilarie Fredrickson, MD Subject: follow-up foreign body

## 2017-11-13 NOTE — Telephone Encounter (Signed)
Left message for pt to call back  °

## 2017-11-13 NOTE — Telephone Encounter (Signed)
Left message for patient to call back  

## 2017-11-14 NOTE — Telephone Encounter (Signed)
Left message for pt to call back  °

## 2017-11-15 NOTE — Telephone Encounter (Signed)
Left another message for pt to call back. Letter mailed to pt.

## 2017-11-18 ENCOUNTER — Emergency Department (HOSPITAL_COMMUNITY)
Admission: EM | Admit: 2017-11-18 | Discharge: 2017-11-18 | Discharge status: Against Medical Advice | Payer: Medicaid Other | Attending: Emergency Medicine | Admitting: Emergency Medicine

## 2017-11-18 ENCOUNTER — Encounter (HOSPITAL_COMMUNITY): Payer: Self-pay | Admitting: Emergency Medicine

## 2017-11-18 ENCOUNTER — Other Ambulatory Visit: Payer: Self-pay

## 2017-11-18 DIAGNOSIS — R600 Localized edema: Secondary | ICD-10-CM | POA: Diagnosis not present

## 2017-11-18 DIAGNOSIS — F15129 Other stimulant abuse with intoxication, unspecified: Secondary | ICD-10-CM | POA: Insufficient documentation

## 2017-11-18 NOTE — ED Triage Notes (Addendum)
Pt states she cut her left middle finger with a knife on 8/30, came here and had sutures placed,  Finger is red and swollen now x2 days. Pt states nothing is draining from wound. No fevers or chills. Pt also reports she swallowed a tack on 8/29, was seen here and had an endoscopy but they were unable to locate it, was supposed to follow up 3 days later but did not. Denies abd pain or any blood in stools

## 2017-11-18 NOTE — ED Provider Notes (Signed)
MSE was initiated and I personally evaluated the patient and placed orders (if any) at  1:55 PM on November 18, 2017.  The patient appears stable so that the remainder of the MSE may be completed by another provider.  Patient placed in Quick Look pathway, seen and evaluated   Chief Complaint: finger infection  HPI:   Patient with complaint of pain and swelling left leg finger over the past 2 days.  Here for evaluation.  Of note she swallowed attack last week and her heart rate is 130 today.  She was to come back for x-ray and she did not return.  Will obtain basic labs and imaging.  Denies drug use.  Denies fevers or chills Patient HR 130- admits to smoking meth all day today.  ROS: Finger infection, negative for fever (one)  Physical Exam:   Gen: No distress  Neuro: Awake and Alert  Skin: Warm    Focused Exam: F middle finger with swelling and erythema stitches in place   Initiation of care has begun. The patient has been counseled on the process, plan, and necessity for staying for the completion/evaluation, and the remainder of the medical screening examination    Arthor Captain, PA-C 11/18/17 1404    Long, Arlyss Repress, MD 11/18/17 [phone]

## 2017-11-18 NOTE — ED Notes (Signed)
Patient now reports using meth all day pta

## 2017-11-18 NOTE — ED Notes (Signed)
PA Harris made aware of patient, will assess and place appropriate orders

## 2017-11-21 ENCOUNTER — Emergency Department (HOSPITAL_COMMUNITY)
Admission: EM | Admit: 2017-11-21 | Discharge: 2017-11-21 | Discharge status: Against Medical Advice | Payer: Medicaid Other | Attending: Emergency Medicine | Admitting: Emergency Medicine

## 2017-11-21 ENCOUNTER — Encounter (HOSPITAL_COMMUNITY): Payer: Self-pay | Admitting: Emergency Medicine

## 2017-11-21 DIAGNOSIS — Z5321 Procedure and treatment not carried out due to patient leaving prior to being seen by health care provider: Secondary | ICD-10-CM | POA: Diagnosis not present

## 2017-11-21 DIAGNOSIS — R109 Unspecified abdominal pain: Secondary | ICD-10-CM | POA: Insufficient documentation

## 2017-11-21 LAB — COMPREHENSIVE METABOLIC PANEL
ALT: 15 U/L (ref 0–44)
AST: 15 U/L (ref 15–41)
Albumin: 3.4 g/dL — ABNORMAL LOW (ref 3.5–5.0)
Alkaline Phosphatase: 73 U/L (ref 38–126)
Anion gap: 7 (ref 5–15)
BUN: 13 mg/dL (ref 6–20)
CO2: 24 mmol/L (ref 22–32)
Calcium: 8.6 mg/dL — ABNORMAL LOW (ref 8.9–10.3)
Chloride: 108 mmol/L (ref 98–111)
Creatinine, Ser: 0.59 mg/dL (ref 0.44–1.00)
GFR calc Af Amer: >60 mL/min (ref >60)
GFR calc non Af Amer: >60 mL/min (ref >60)
Glucose, Bld: 178 mg/dL — ABNORMAL HIGH (ref 70–99)
Potassium: 3.5 mmol/L (ref 3.5–5.1)
Sodium: 139 mmol/L (ref 135–145)
Total Bilirubin: 0.5 mg/dL (ref 0.3–1.2)
Total Protein: 6.2 g/dL — ABNORMAL LOW (ref 6.5–8.1)

## 2017-11-21 LAB — CBC
HCT: 40.5 % (ref 36.0–46.0)
Hemoglobin: 13.2 g/dL (ref 12.0–15.0)
MCH: 28.4 pg (ref 26.0–34.0)
MCHC: 32.6 g/dL (ref 30.0–36.0)
MCV: 87.1 fL (ref 78.0–100.0)
Platelets: 280 10*3/uL (ref 150–400)
RBC: 4.65 MIL/uL (ref 3.87–5.11)
RDW: 13.2 % (ref 11.5–15.5)
WBC: 7.7 10*3/uL (ref 4.0–10.5)

## 2017-11-21 LAB — LIPASE, BLOOD: Lipase: 44 U/L (ref 11–51)

## 2017-11-21 LAB — I-STAT BETA HCG BLOOD, ED (MC, WL, AP ONLY): I-stat hCG, quantitative: <5 m[IU]/mL (ref <5)

## 2017-11-21 NOTE — ED Notes (Signed)
Called Pt to be roomed, no response in lobby x3.

## 2017-11-21 NOTE — ED Triage Notes (Signed)
Per [facility] pt having abd pains with n/v that started today. Reports that she swallowed a tack on 8/29 and had endoscopy.

## 2017-11-28 ENCOUNTER — Emergency Department (HOSPITAL_COMMUNITY): Payer: Medicaid Other

## 2017-11-28 ENCOUNTER — Emergency Department (HOSPITAL_COMMUNITY)
Admission: EM | Admit: 2017-11-28 | Discharge: 2017-11-29 | Disposition: A | Discharge status: Other Acute Inpatient Hospital | Payer: Medicaid Other | Attending: Emergency Medicine | Admitting: Emergency Medicine

## 2017-11-28 ENCOUNTER — Encounter (HOSPITAL_COMMUNITY): Payer: Self-pay | Admitting: Emergency Medicine

## 2017-11-28 ENCOUNTER — Other Ambulatory Visit: Payer: Self-pay

## 2017-11-28 DIAGNOSIS — E039 Hypothyroidism, unspecified: Secondary | ICD-10-CM | POA: Diagnosis not present

## 2017-11-28 DIAGNOSIS — K122 Cellulitis and abscess of mouth: Secondary | ICD-10-CM | POA: Insufficient documentation

## 2017-11-28 DIAGNOSIS — K0889 Other specified disorders of teeth and supporting structures: Secondary | ICD-10-CM | POA: Diagnosis present

## 2017-11-28 DIAGNOSIS — E119 Type 2 diabetes mellitus without complications: Secondary | ICD-10-CM | POA: Diagnosis not present

## 2017-11-28 DIAGNOSIS — J45909 Unspecified asthma, uncomplicated: Secondary | ICD-10-CM | POA: Diagnosis not present

## 2017-11-28 DIAGNOSIS — Z79899 Other long term (current) drug therapy: Secondary | ICD-10-CM | POA: Insufficient documentation

## 2017-11-28 DIAGNOSIS — K047 Periapical abscess without sinus: Secondary | ICD-10-CM

## 2017-11-28 LAB — CBC WITH DIFFERENTIAL/PLATELET
Abs Immature Granulocytes: 0 10*3/uL (ref 0.0–0.1)
Basophils Absolute: 0 10*3/uL (ref 0.0–0.1)
Basophils Relative: 0 %
Eosinophils Absolute: 0.2 10*3/uL (ref 0.0–0.7)
Eosinophils Relative: 2 %
HCT: 40.1 % (ref 36.0–46.0)
Hemoglobin: 12.8 g/dL (ref 12.0–15.0)
Immature Granulocytes: 0 %
Lymphocytes Relative: 15 %
Lymphs Abs: 1.6 10*3/uL (ref 0.7–4.0)
MCH: 28.1 pg (ref 26.0–34.0)
MCHC: 31.9 g/dL (ref 30.0–36.0)
MCV: 88.1 fL (ref 78.0–100.0)
Monocytes Absolute: 1 10*3/uL (ref 0.1–1.0)
Monocytes Relative: 10 %
Neutro Abs: 7.9 10*3/uL — ABNORMAL HIGH (ref 1.7–7.7)
Neutrophils Relative %: 73 %
Platelets: 235 10*3/uL (ref 150–400)
RBC: 4.55 MIL/uL (ref 3.87–5.11)
RDW: 12.9 % (ref 11.5–15.5)
WBC: 10.9 10*3/uL — ABNORMAL HIGH (ref 4.0–10.5)

## 2017-11-28 LAB — BASIC METABOLIC PANEL
Anion gap: 13 (ref 5–15)
BUN: 9 mg/dL (ref 6–20)
CO2: 23 mmol/L (ref 22–32)
Calcium: 8.9 mg/dL (ref 8.9–10.3)
Chloride: 101 mmol/L (ref 98–111)
Creatinine, Ser: 0.68 mg/dL (ref 0.44–1.00)
GFR calc Af Amer: >60 mL/min (ref >60)
GFR calc non Af Amer: >60 mL/min (ref >60)
Glucose, Bld: 195 mg/dL — ABNORMAL HIGH (ref 70–99)
Potassium: 3.2 mmol/L — ABNORMAL LOW (ref 3.5–5.1)
Sodium: 137 mmol/L (ref 135–145)

## 2017-11-28 IMAGING — CT CT MAXILLOFACIAL W/ CM
3 series · 15 of 47 positions shown, 18 images · IV contrast (APPLIED)
Comparison: None.

CLINICAL DATA: Initial evaluation for acute pain and swelling about
the right mandible.

EXAM:
CT MAXILLOFACIAL WITH CONTRAST
TECHNIQUE: Multidetector CT imaging of the maxillofacial structures was
performed with intravenous contrast. Multiplanar CT image
reconstructions were also generated.
CONTRAST:  75mL OMNIPAQUE IOHEXOL 300 MG/ML  SOLN

[Series 3: facial/orbits w 2.0 st · axial · 0.39mm/px · z∈[-202,-44]mm · 9 of 93 slices shown, 12 images]
[im 7/93  brain]
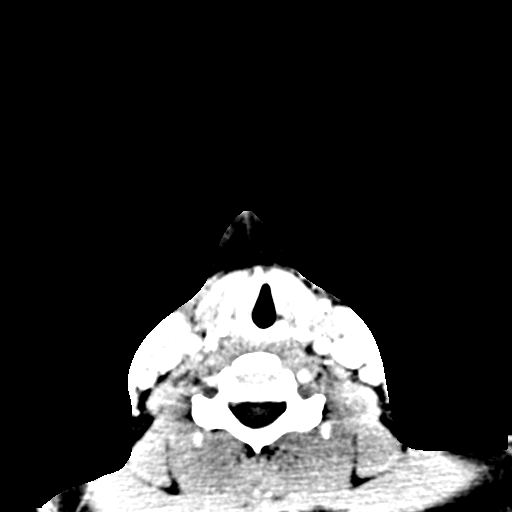
[im 7/93  bone]
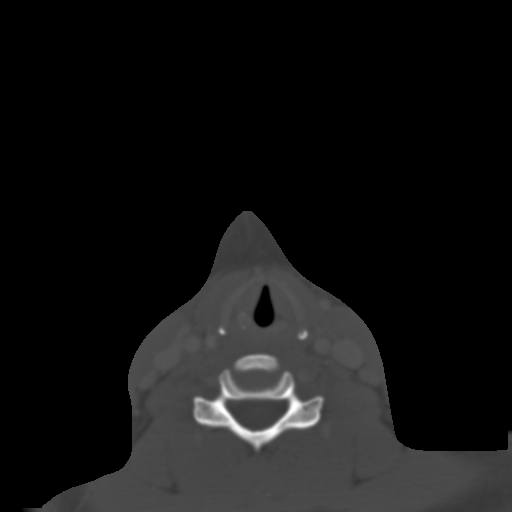
[im 16/93  bone]
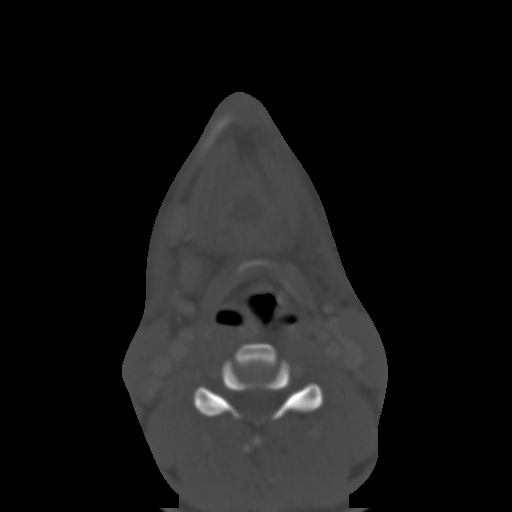
[im 26/93  bone]
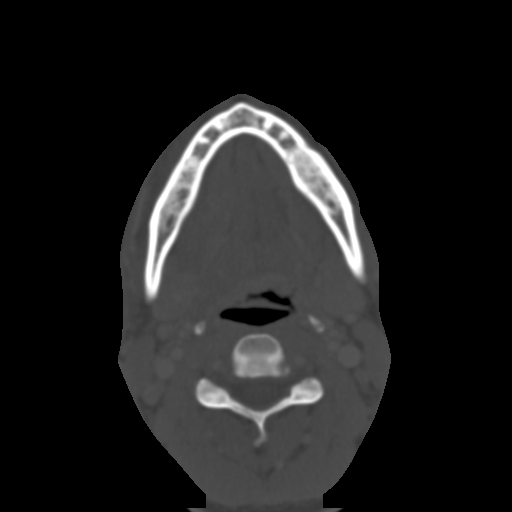
[im 35/93  bone]
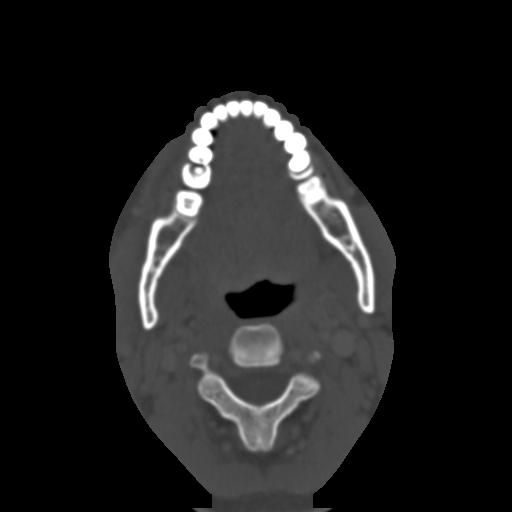
[im 48/93  brain]
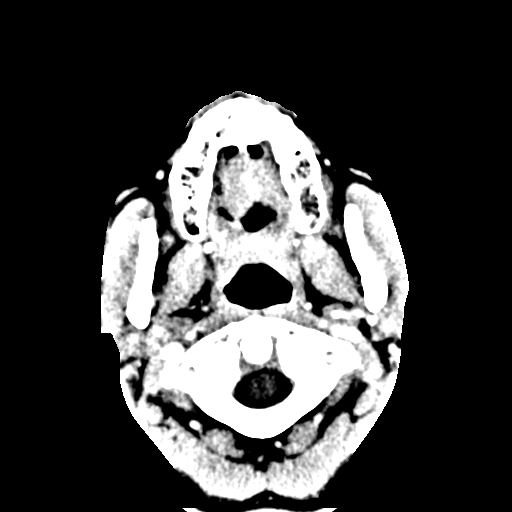
[im 48/93  bone]
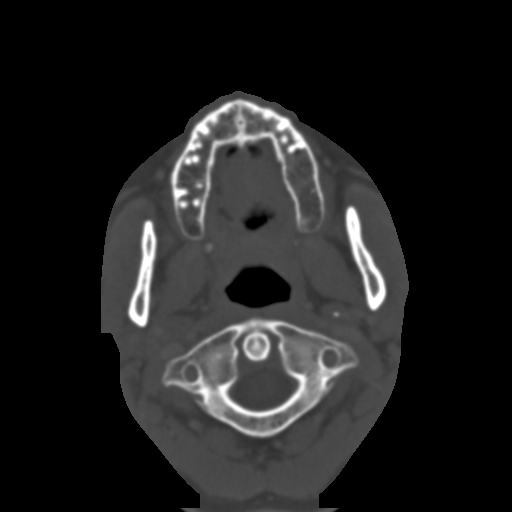
[im 58/93  bone]
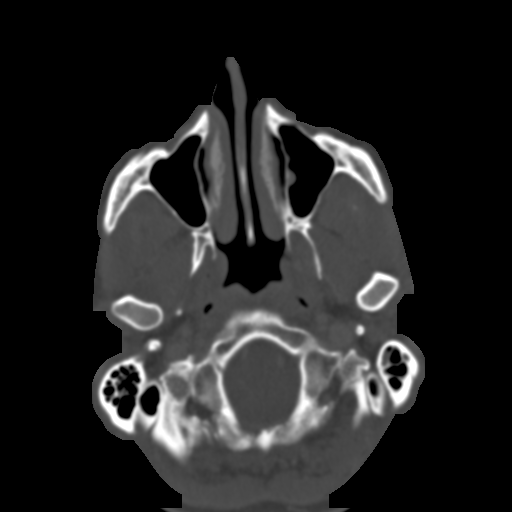
[im 67/93  bone]
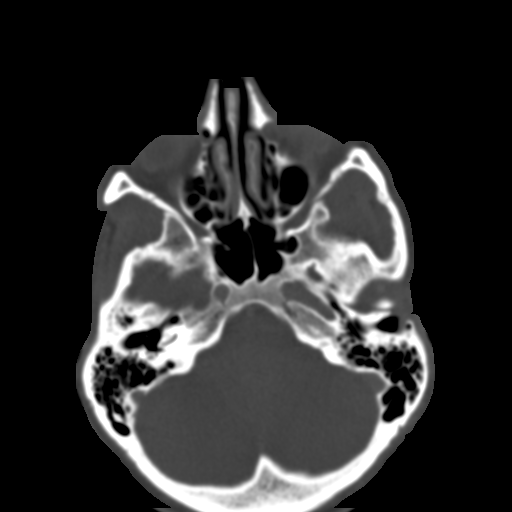
[im 77/93  bone]
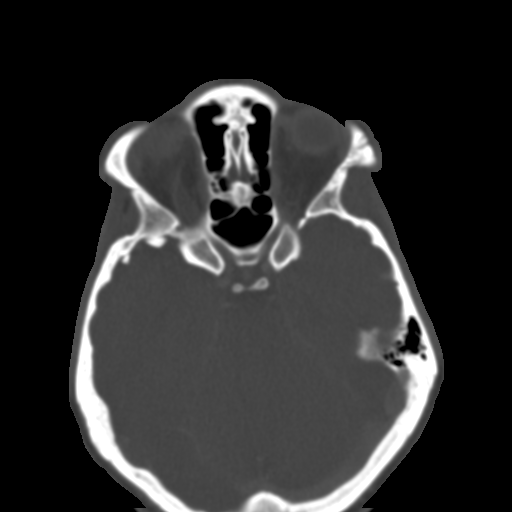
[im 86/93  brain]
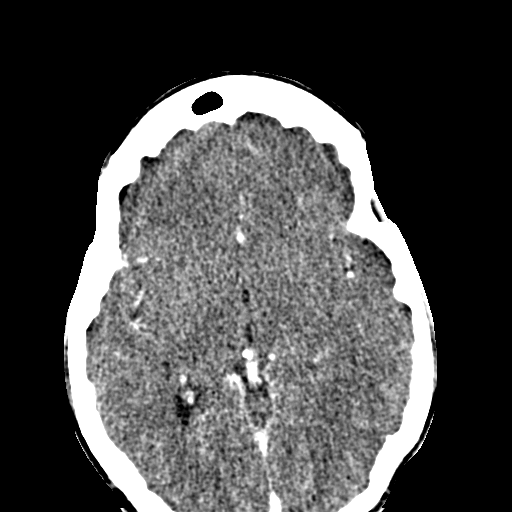
[im 86/93  bone]
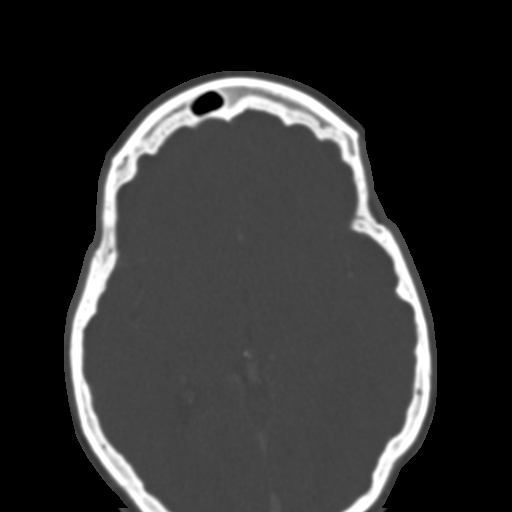

[Series 7: coronal soft tissue · coronal · 0.38mm/px · 3 of 94 slices shown]
[im 32/94  bone]
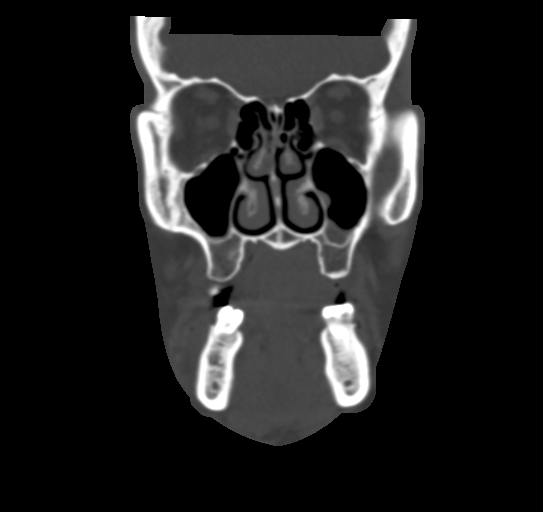
[im 42/94  bone]
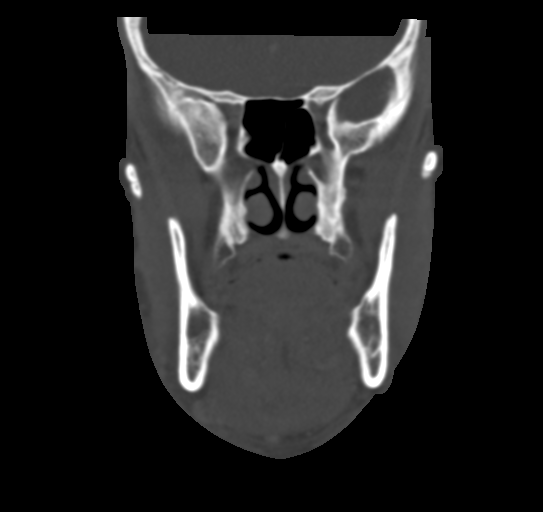
[im 52/94  bone]
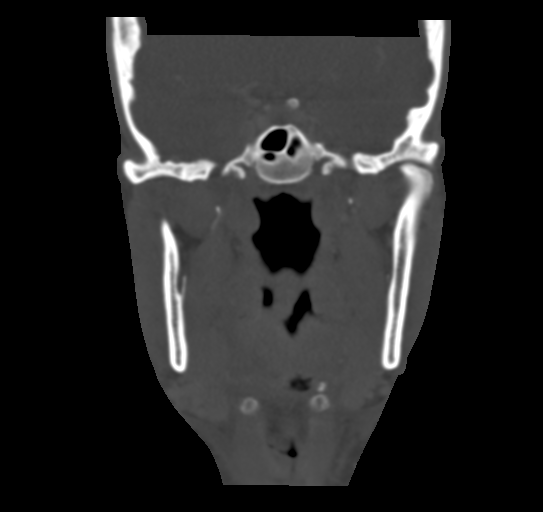

[Series 8: sagittal soft tissue · sagittal · 0.37mm/px · 3 of 105 slices shown]
[im 35/105  bone]
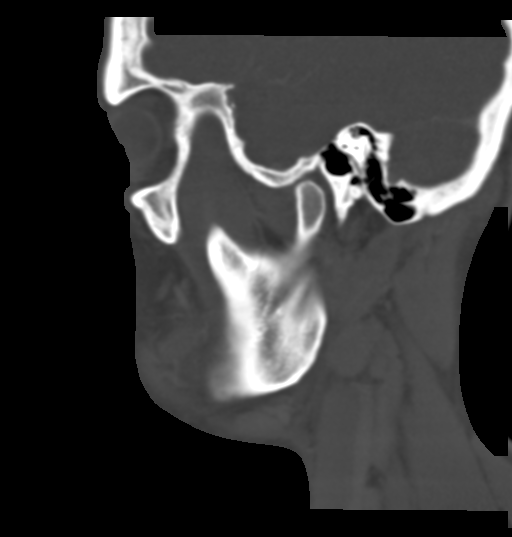
[im 53/105  bone]
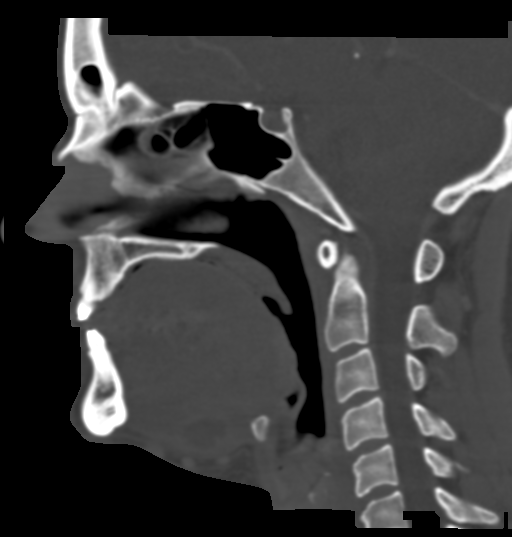
[im 70/105  bone]
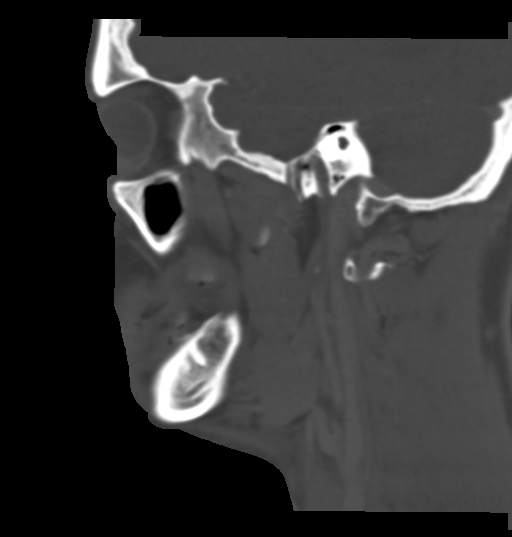

[15 of 47 positions shown; findings below may reference images not displayed]

FINDINGS: Osseous: No acute osseous abnormality within the face. Prominent
dental JOSHJAX with periapical lucency noted about the right first
mandibular molar. Additional small dental JOSHJAX noted within the
adjacent right mandibular second bicuspid. Additional caries noted
at the left first and second mandibular molars as well.

Orbits: Globes and orbital soft tissues within normal limits.

Sinuses: Mild scattered mucosal thickening about the ethmoidal air
cells in maxillary sinuses bilaterally. Paranasal sinuses are
otherwise largely clear. No air-fluid level to suggest acute
sinusitis. Trace left mastoid effusion noted at the inferior left
mastoid air cells, likely benign/sterile. Mastoid air cells are
otherwise clear. Middle ear cavities are well pneumatized and free
of fluid.

Soft tissues: Soft tissue swelling with inflammatory stranding seen
about the right mandibular body, involving the right masticator and
submandibular spaces. Inflammatory stranding extends into the
submental region, with mild inflammatory changes within the right
parapharyngeal space as well. There is a loculated hypodense rim
enhancing collection extending medially and inferiorly from the
right mandibular body into the central aspect of the floor of mouth
measuring approximately 1.5 x 3.3 x 1.2 cm in greatest dimensions
(AP by oblique transverse by oblique craniocaudad). Scattered foci
of internal gas seen within this collection. Finding consistent with
abscess. Collection originates near the base of the right first
mandibular molar where there is prominent dental JOSHJAX and
periapical lucency (series 4, images 35, 28), likely source of
infection. Mild localized edema with mass effect within the right
floor of mouth. Oropharyngeal airway remains patent at this time.

Few enlarged right level IB lymph nodes measure up to 12 mm, likely
reactive.

Limited intracranial: Possible Chiari 1 malformation with the
cerebellar tonsils extending approximately 9 mm through the foramen
magnum. Visualized intracranial contents otherwise unremarkable.
IMPRESSION: Acute swelling with inflammatory changes about the right mandibular
body with superimposed 1.5 x 3.3 x 1.2 cm abscess coursing
inferiorly and medially into the right floor of mouth as above.
Source of infection most likely odontogenic in nature, with
prominent dental JOSHJAX and periapical lucency about the right first
mandibular molar.

## 2017-11-28 MED ORDER — MORPHINE SULFATE (PF) 2 MG/ML IV SOLN
2.0000 mg | Freq: Once | INTRAVENOUS | Status: AC
  Administered 2017-11-28: 2 mg via INTRAVENOUS
  Filled 2017-11-28: qty 1

## 2017-11-28 MED ORDER — KETOROLAC TROMETHAMINE 30 MG/ML IJ SOLN
30.0000 mg | Freq: Once | INTRAMUSCULAR | Status: AC
  Administered 2017-11-28: 30 mg via INTRAVENOUS
  Filled 2017-11-28: qty 1

## 2017-11-28 MED ORDER — IOHEXOL 300 MG/ML  SOLN
75.0000 mL | Freq: Once | INTRAMUSCULAR | Status: AC | PRN
  Administered 2017-11-28: 75 mL via INTRAVENOUS

## 2017-11-28 MED ORDER — SODIUM CHLORIDE 0.9 % IV SOLN
3.0000 g | Freq: Once | INTRAVENOUS | Status: AC
  Administered 2017-11-28: 3 g via INTRAVENOUS
  Filled 2017-11-28: qty 3

## 2017-11-28 MED ORDER — POTASSIUM CHLORIDE 10 MEQ/100ML IV SOLN
10.0000 meq | Freq: Once | INTRAVENOUS | Status: AC
  Administered 2017-11-28: 10 meq via INTRAVENOUS
  Filled 2017-11-28: qty 100

## 2017-11-28 MED ORDER — MORPHINE SULFATE (PF) 4 MG/ML IV SOLN
4.0000 mg | Freq: Once | INTRAVENOUS | Status: AC
  Administered 2017-11-28: 4 mg via INTRAVENOUS
  Filled 2017-11-28: qty 1

## 2017-11-28 MED ORDER — DEXAMETHASONE SODIUM PHOSPHATE 10 MG/ML IJ SOLN
10.0000 mg | Freq: Once | INTRAMUSCULAR | Status: AC
  Administered 2017-11-28: 10 mg via INTRAVENOUS
  Filled 2017-11-28: qty 1

## 2017-11-28 NOTE — ED Triage Notes (Signed)
Pt reports that 2 days ago she started having pain and right jaw swelling resulting from a decayed tooth. Pt has hx of dental problems.

## 2017-11-28 NOTE — ED Provider Notes (Signed)
[facility] at shift change.  Please see his note for full details. In short patient presenting with facial pain and swelling after right lower dental abscess.  CT pending at time of handoff.  Physical Exam  BP 131/90   Pulse 94   Temp 99.7 F (37.6 C) (Oral)   Resp 15   Ht 5' 7.5" (1.715 m)   LMP  (LMP Unknown) Comment: depo shot, irregular periods  SpO2 99%   BMI 18.98 kg/m   Physical Exam  Constitutional: She appears well-developed and well-nourished.  Patient tearful, stating severe pain at this time. Not in respiratory distress.  HENT:  Head: Normocephalic and atraumatic.  Right Ear: External ear normal.  Left Ear: External ear normal.  Nose: Nose normal.  Mouth/Throat: Dental abscesses present. No uvula swelling.  No tongue protrusion of tongue base elevation at this time. Patient handling secretions without difficulty. No drooling present.  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  Pulmonary/Chest: Effort normal and breath sounds normal. No stridor. No respiratory distress.  Abdominal: Soft. There is no tenderness. There is no rebound and no guarding.  Musculoskeletal: Normal range of motion.  Neurological: She is alert. No sensory deficit.  Skin: Skin is warm and dry. Capillary refill takes less than 2 seconds.  Psychiatric: She has a normal mood and affect. Her behavior is normal.    ED Course/Procedures   Clinical Course as of Nov 29 112  Thu Nov 28, 2017  2245 Results of CT scan discussed with Dr. Silverio LayYao who recommends ENT consult.   [BM]  2254 Consult called to ENT Dr. Jenne PaneBates, Dr. Jenne PaneBates question was if there is respiratory compromise. I have informed Dr. 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 Dr. Jenne PaneBates who advises oral surgery consult.   [BM]  2310 Patient seen and evaluated by Dr. Silverio LayYao. Advises 10mg  IV decadron [facility]   [BM]  2349 [facility] Nicole CellaMagister. Informed of CT scan results, clinical appearance of Ludwig's angina stemming for right lower dental abscess. Patient tolerating secretions and no airway compromise at this time however with concern for worsening symptoms without definitive treatment. During consult Dr. Silverio LayYao personally spoke to Dr. Nicole CellaMagister and explained situation. Dr. Nicole CellaMagister has accepted patient, ED to ED transfer underway.   [BM]  Fri Nov 29, 2017  [id number] [facility].  Patient has been reevaluated by me, resting comfortably in bed no acute distress, tolerating oral secretions well.  Patient is speaking more clearly compared to my previous evaluation, endorses improvement in pain at this time.  Patient states understanding of care plan and is agreeable to transfer.   [BM]    Clinical Course User Index [BM] Bill SalinasMorelli, Yasir Kitner A, PA-C    Procedures  MDM  Handoff received pending CT scan.  IV Unasyn and pain control begun by previous provider.  IMPRESSION:  Acute swelling with inflammatory changes about the right mandibular  body with superimposed 1.5 x 3.3 x 1.2 cm abscess coursing  inferiorly and medially into the right floor of mouth as above.  Source of infection most likely odontogenic in nature, with  prominent dental carie and periapical lucency about the right first  mandibular molar.    CT scan resulted  and discussed with Dr. Silverio Lay who recommended ENT consult.  Consult called to ENT Dr. Jenne Pane, expressed concerns of Ludwig's angina without respiratory distress at this time.  Dr. Jenne Pane states that we should call oral surgery due to this being a complication from a dental abscess, informed that no oral surgeon is on-call at our facility. Consult with Dr. Jenne Pane has been discussed  with Dr. Silverio Lay. Dr Silverio Lay has come to see the patient. Dr. 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  Consult called to admitting [facility] ENT Dr. Nicole Cella, who has informed us that they do not have Oral Surgery on call at this time. Dr. Nicole Cella has been informed of CT scan results, clinical appearance of Ludwig's angina stemming for right lower dental abscess. Patient tolerating secretions and no airway compromise at this time however with concern for worsening symptoms without definitive treatment. During consult Dr. Silverio Lay personally spoke to Dr. Nicole Cella and explained situation. Dr. Nicole Cella has accepted patient, ED to ED transfer underway.  ------------------------------------------------------------------------------------- I have reevaluated the patient, she is resting comfortably in bed no acute distress at this time.  Patient tolerating oral secretions without difficulty, no drooling.  Patient has been updated on care plan and is agreeable to transfer at this time.  Patient states that pain has improved since receiving additional 2 mg of morphine and 10 mg of Decadron.  Patient denies trouble breathing or swallowing at this time.  No elevation of the tongue base or protrusion of tongue at this time.  [facility].  I reevaluated the patient upon their arrival, patient speaking more clearly and endorsing slight improvement to her pain at this time.  Patient is agreeable to plan of care and transfer.  No tongue protrusion, respiratory distress or stridor at this time.  Patient is tolerating her secretions without difficulty.  At this time patient appears stable for transfer to [facility]   Note: Portions of this report may have been transcribed using voice recognition software. Every effort was made to ensure accuracy;  however, inadvertent computerized transcription errors may still be present.   Elizabeth Palau 11/29/17 0119    Charlynne Pander, MD 11/29/17 [phone]

## 2017-11-28 NOTE — ED Provider Notes (Signed)
[facility] EMERGENCY DEPARTMENT Provider Note   CSN: [id number] Arrival date & time: 11/28/17  1655     History   Chief Complaint Chief Complaint  Patient presents with  . Dental Pain  . Facial Swelling    HPI Kristin Hart is a 37 y.o. female.  HPI   37 year old female presents today with complaints of facial pain and swelling.  Patient notes 2 days ago she developed pain at her right first molar.  She notes this tooth is severely decayed, she notes swelling to the area that has spread to under her jaw.  She denies any fever, difficulty swallowing, she notes pain with range of motion of the jaw.  She notes that she is an IV drug user but has not used in the last 5 days.  Past Medical History:  Diagnosis Date  . Asthma   . 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 Diabetes mellitus without complication (HCC)    type 2  . Hypothyroidism   . Stomach ulcer     Patient Active Problem List   Diagnosis Date Noted  . Foreign body in small intestine   . Anxiety 05/29/2017  . Self-mutilation 05/29/2017  . Depression 06/07/2016  . Marijuana use 06/07/2016  . Low vitamin D level 03/15/2016  . Diabetes mellitus affecting pregnancy 02/22/2016  . Hypothyroidism 02/22/2016  . Asthma 02/22/2016  . ADD (attention deficit disorder) 02/22/2016  . 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 Hyperlipidemia with target LDL less than 70 01/31/2012  . Migraine 01/31/2012  . Tremor, 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    Past Surgical History:  Procedure Laterality Date  . CESAREAN SECTION N/A 07/04/2016   Procedure: CESAREAN SECTION;  Surgeon: [name], DO;  Location: [facility] BIRTHING SUITES;  Service: Obstetrics;  Laterality: N/A;  . ENTEROSCOPY N/A 11/08/2017   Procedure: ENTEROSCOPY;  Surgeon: Hilarie Fredrickson, MD;  Location: [facility] ENDOSCOPY;  Service: Endoscopy;  Laterality: N/A;  . NO PAST SURGERIES       OB History    Gravida  1   Para  1   Term  1   Preterm      AB      Living        SAB      TAB      Ectopic      Multiple  0   Live Births               Home Medications    Prior to Admission medications   Medication Sig Start Date End Date Taking? Authorizing Provider  albuterol (PROAIR HFA) 108 (90 Base) MCG/ACT inhaler Inhale 1 puff into the lungs every 4 (four) hours as needed.  06/11/16   [provider]  amoxicillin (AMOXIL) 500 MG capsule Take 1 capsule (500 mg total) by mouth 3 (three) times daily. Patient not taking: Reported on 11/08/2017 05/16/17   Janne Napoleon, NP  busPIRone (BUSPAR) 10 MG tablet Take 10 mg by mouth 2 (two) times daily. 11/07/17   [provider]  doxycycline (VIBRAMYCIN) 100 MG capsule Take 1 capsule (100 mg total) by mouth 2 (two) times daily. Patient not taking: Reported on 11/08/2017 09/19/17   Evoleht Hovatter, Tinnie Gens, PA-C  fluconazole (DIFLUCAN) 150 MG tablet Take 1 tablet (150 mg total) by mouth daily. Patient not taking: Reported on 11/08/2017 09/29/17   Elson Areas, PA-C  guaiFENesin (MUCINEX) 600 MG 12 hr tablet Take 1 tablet (  600 mg total) by mouth 2 (two) times daily. Patient not taking: Reported on 11/08/2017 08/21/16   Aviva SignsWilliams, Marie L, CNM  ibuprofen (ADVIL,MOTRIN) 600 MG tablet Take 1 tablet (600 mg total) by mouth every 6 (six) hours as needed. Patient taking differently: Take 800 mg by mouth every 6 (six) hours as needed.  07/07/16   Arabella MerlesShaw, Kimberly D, CNM  metroNIDAZOLE (FLAGYL) 500 MG tablet Take 1 tablet (500 mg total) by mouth 2 (two) times daily. Patient not taking: Reported on 11/08/2017 09/29/17   Elson AreasSofia, Leslie K, PA-C  naproxen (NAPROSYN) 375 MG tablet Take 1 tablet (375 mg total) by mouth 2 (two) times daily. Patient not taking: Reported on 11/08/2017 05/16/17   Janne NapoleonNeese, Hope M, NP  potassium chloride 20 MEQ TBCR Take 20 mEq by mouth daily. Patient not taking: Reported on 11/08/2017 09/19/17   Loleta Frommelt, Tinnie GensJeffrey, PA-C  Prenat-FeCbn-FeAspGl-FA-Omega  (OB COMPLETE PETITE) 35-5-1-200 MG CAPS Take 1 tablet by mouth daily. Patient not taking: Reported on 08/21/2016 06/11/16   Levie HeritageStinson, Jacob J, DO  Vitamin D, Ergocalciferol, (DRISDOL) 50000 units CAPS capsule Take 1 capsule (50,000 Units total) by mouth every 7 (seven) days. Patient not taking: Reported on 05/30/2017 03/15/16   Roe Coombsenney, Rachelle A, CNM    Family History History reviewed. No pertinent family history.  Social History Social History   Tobacco Use  . Smoking status: Never Smoker  . Smokeless tobacco: Never Used  Substance Use Topics  . Alcohol use: No  . Drug use: Yes    Types: Methamphetamines, Marijuana    Comment: last used in Aug/Sept??     Allergies   Patient has no known allergies.   Review of Systems Review of Systems  All other systems reviewed and are negative.    Physical Exam Updated Vital Signs BP 133/77   Pulse 86   Temp 99.3 F (37.4 C) (Oral)   Resp 16   Ht 5' 7.5" (1.715 m)   LMP  (LMP Unknown) Comment: depo shot, irregular periods  SpO2 99%   BMI 18.98 kg/m   Physical Exam  Constitutional: She is oriented to person, place, and time. She appears well-developed and well-nourished.  HENT:  Head: Normocephalic and atraumatic.  Obvious facial swelling along the right lower jaw, this extends into the right submandibular space, neck supple full active range of motion nontender without swelling or edema-floor the mouth is soft-right lateral gumline with induration at the first molar-severely decayed right first molar- discharge noted at 1st molar   Eyes: Pupils are equal, round, and reactive to light. Conjunctivae are normal. Right eye exhibits no discharge. Left eye exhibits no discharge. No scleral icterus.  Neck: Normal range of motion. No JVD present. No tracheal deviation present.  Pulmonary/Chest: Effort normal. No stridor.  Neurological: She is alert and oriented to person, place, and time. Coordination normal.  Psychiatric: She has a normal  mood and affect. Her behavior is normal. Judgment and thought content normal.  Nursing note and vitals reviewed.    ED Treatments / Results  Labs (all labs ordered are listed, but only abnormal results are displayed) Labs Reviewed  CBC WITH DIFFERENTIAL/PLATELET - Abnormal; Notable for the following components:      Result Value   WBC 10.9 (*)    Neutro Abs 7.9 (*)    All other components within normal limits  BASIC METABOLIC PANEL - Abnormal; Notable for the following components:   Potassium 3.2 (*)    Glucose, Bld 195 (*)    All  other components within normal limits    EKG None  Radiology No results found.  Procedures .Marland KitchenIncision and Drainage Date/Time: 11/28/2017 8:39 PM Performed by: Eyvonne Mechanic, PA-C Authorized by: Eyvonne Mechanic, PA-C   Consent:    Consent obtained:  Verbal   Consent given by:  Patient   Risks discussed:  Bleeding, damage to other organs, infection, incomplete drainage and pain   Alternatives discussed:  No treatment, alternative treatment and delayed treatment Location:    Type:  Abscess   Size:  .5   Location: right lower gumline  Anesthesia (see MAR for exact dosages):    Anesthesia method:  Local infiltration   Local anesthetic:  Bupivacaine 0.5% WITH epi Procedure type:    Complexity:  Simple Procedure details:    Incision types:  Single straight   Incision depth:  Submucosal   Scalpel blade:  11   Drainage:  Bloody   Drainage amount:  Scant   Wound treatment:  Wound left open   Packing materials:  None Post-procedure details:    Patient tolerance of procedure:  Tolerated well, no immediate complications   (including critical care time)  Medications Ordered in ED Medications  potassium chloride 10 mEq in 100 mL IVPB (10 mEq Intravenous New Bag/Given 11/28/17 2208)  ketorolac (TORADOL) 30 MG/ML injection 30 mg (30 mg Intravenous Given 11/28/17 1942)  morphine 4 MG/ML injection 4 mg (4 mg Intravenous Given 11/28/17 2015)    Ampicillin-Sulbactam (UNASYN) 3 g in sodium chloride 0.9 % 100 mL IVPB (0 g Intravenous Stopped 11/28/17 2147)  iohexol (OMNIPAQUE) 300 MG/ML solution 75 mL (75 mLs Intravenous Contrast Given 11/28/17 2152)     Initial Impression / Assessment and Plan / ED Course  I have reviewed the triage vital signs and the nursing notes.  Pertinent labs & imaging results that were available during my care of the patient were reviewed by me and considered in my medical decision making (see chart for details).     Labs: CBC, BMP  Imaging: CT maxillofacial with contrast  Consults:  Therapeutics: Bupivacaine, morphine  Discharge Meds:   Assessment/Plan: 37 year old female presents today with dental infection.  Patient does have swelling along the lateral gumline and under the jaw.  She has purulent discharge coming out of the tooth, dental anesthesia was performed, incision was made with no significant purulent drainage at that site.. Patient is afebrile with minimal elevation in white count.  CT scan pending care signed to oncoming provider pending CT results.  Patient does not appear to be systemically ill.     Final Clinical Impressions(s) / ED Diagnoses   Final diagnoses:  Dental infection    ED Discharge Orders    None       Rosalio Loud 11/28/17 2212    Little, Ambrose Finland, MD 12/06/17 1520

## 2017-11-28 NOTE — ED Notes (Signed)
Patient transported to CT 

## 2017-11-29 MED ORDER — SODIUM CHLORIDE 0.9 % IV SOLN
INTRAVENOUS | Status: DC
Start: ? — End: 2017-11-29

## 2017-11-29 MED ORDER — CLINDAMYCIN PHOSPHATE IN D5W 600 MG/50ML IV SOLN
600.00 | INTRAVENOUS | Status: DC
Start: 2017-11-29 — End: 2017-11-29

## 2017-11-29 MED ORDER — MELATONIN 3 MG PO TABS
6.00 | ORAL_TABLET | ORAL | Status: DC
Start: ? — End: 2017-11-29

## 2017-11-29 MED ORDER — POLYETHYLENE GLYCOL 3350 17 G PO PACK
17.00 g | PACK | ORAL | Status: DC
Start: 2017-11-30 — End: 2017-11-29

## 2017-11-29 MED ORDER — ENOXAPARIN SODIUM 40 MG/0.4ML ~~LOC~~ SOLN
40.00 | SUBCUTANEOUS | Status: DC
Start: 2017-11-30 — End: 2017-11-29

## 2017-11-29 MED ORDER — OXYCODONE HCL 5 MG PO TABS
5.00 | ORAL_TABLET | ORAL | Status: DC
Start: ? — End: 2017-11-29

## 2017-11-29 MED ORDER — ONDANSETRON HCL 4 MG/2ML IJ SOLN
4.00 | INTRAMUSCULAR | Status: DC
Start: ? — End: 2017-11-29

## 2017-11-29 MED ORDER — ACETAMINOPHEN 325 MG PO TABS
650.00 | ORAL_TABLET | ORAL | Status: DC
Start: 2017-11-29 — End: 2017-11-29

## 2017-11-29 MED ORDER — IBUPROFEN 400 MG PO TABS
400.00 | ORAL_TABLET | ORAL | Status: DC
Start: 2017-11-29 — End: 2017-11-29

## 2017-11-29 NOTE — ED Notes (Signed)
[facility] called for transport

## 2017-12-18 ENCOUNTER — Ambulatory Visit (INDEPENDENT_AMBULATORY_CARE_PROVIDER_SITE_OTHER): Payer: Medicaid Other | Admitting: Student

## 2017-12-18 ENCOUNTER — Other Ambulatory Visit (HOSPITAL_COMMUNITY)
Admission: RE | Admit: 2017-12-18 | Discharge: 2017-12-18 | Disposition: A | Discharge status: Home / Self Care | Payer: Medicaid Other | Source: Ambulatory Visit | Attending: Student | Admitting: Student

## 2017-12-18 ENCOUNTER — Encounter: Payer: Self-pay | Admitting: Student

## 2017-12-18 VITALS — BP 121/75 | HR 97 | Ht 67.5 in | Wt 138.4 lb

## 2017-12-18 DIAGNOSIS — E039 Hypothyroidism, unspecified: Secondary | ICD-10-CM | POA: Insufficient documentation

## 2017-12-18 DIAGNOSIS — Z01419 Encounter for gynecological examination (general) (routine) without abnormal findings: Secondary | ICD-10-CM | POA: Insufficient documentation

## 2017-12-18 DIAGNOSIS — Z0000 Encounter for general adult medical examination without abnormal findings: Secondary | ICD-10-CM | POA: Diagnosis not present

## 2017-12-18 DIAGNOSIS — E119 Type 2 diabetes mellitus without complications: Secondary | ICD-10-CM | POA: Insufficient documentation

## 2017-12-18 DIAGNOSIS — J45909 Unspecified asthma, uncomplicated: Secondary | ICD-10-CM | POA: Diagnosis not present

## 2017-12-18 DIAGNOSIS — Z3202 Encounter for pregnancy test, result negative: Secondary | ICD-10-CM | POA: Diagnosis not present

## 2017-12-18 DIAGNOSIS — F603 Borderline personality disorder: Secondary | ICD-10-CM | POA: Insufficient documentation

## 2017-12-18 LAB — POCT PREGNANCY, URINE: Preg Test, Ur: NEGATIVE

## 2017-12-18 NOTE — Progress Notes (Signed)
Pt requests results be left on her voicemail.  Number confirmed.

## 2017-12-18 NOTE — Progress Notes (Signed)
GYNECOLOGY CLINIC ANNUAL PREVENTATIVE CARE ENCOUNTER NOTE  Subjective:   Kristin Hart is a 37 y.o. G2P1000 female here for a routine annual gynecologic exam.  Current complaints: painful bumps on left axilla & right labia.   Denies abnormal vaginal bleeding, discharge, pelvic pain, problems with intercourse or other gynecologic concerns.    Gynecologic History No LMP recorded (lmp unknown). Patient has had an injection. Contraception: Depo-Provera injections Last Pap: 04/12/2016. Results were: normal Last mammogram: n/a.   Obstetric History OB History  Gravida Para Term Preterm AB Living  1 1 1         SAB TAB Ectopic Multiple Live Births        0      # Outcome Date GA Lbr Len/2nd Weight Sex Delivery Anes PTL Lv  1 Term 07/04/16 [redacted]w[redacted]d  7 lb 3.3 oz (3.27 kg) M CS-LTranv EPI, Gen  LIV    Past Medical History:  Diagnosis Date  . Asthma   . 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 Diabetes mellitus without complication (HCC)    type 2  . Hypothyroidism   . Stomach ulcer     Past Surgical History:  Procedure Laterality Date  . CESAREAN SECTION N/A 07/04/2016   Procedure: CESAREAN SECTION;  Surgeon: [name], DO;  Location: [facility] BIRTHING SUITES;  Service: Obstetrics;  Laterality: N/A;  . ENTEROSCOPY N/A 11/08/2017   Procedure: ENTEROSCOPY;  Surgeon: Hilarie Fredrickson, MD;  Location: [facility] ENDOSCOPY;  Service: Endoscopy;  Laterality: N/A;  . NO PAST SURGERIES      Current Outpatient Medications on File Prior to Visit  Medication Sig Dispense Refill  . albuterol (PROAIR HFA) 108 (90 Base) MCG/ACT inhaler Inhale 1 puff into the lungs every 4 (four) hours as needed.     Marland Kitchen ibuprofen (ADVIL,MOTRIN) 600 MG tablet Take 1 tablet (600 mg total) by mouth every 6 (six) hours as needed. (Patient taking differently: Take 800 mg by mouth every 6 (six) hours as needed. ) 30 tablet 0  . naproxen (NAPROSYN) 375 MG tablet Take 1 tablet (375 mg total)  by mouth 2 (two) times daily. 20 tablet 0   No current facility-administered medications on file prior to visit.     No Known Allergies  Social History   Socioeconomic History  . Marital status: Single    Spouse name: Not on file  . Number of children: Not on file  . Years of education: Not on file  . Highest education level: Not on file  Occupational History  . Not on file  Social Needs  . Financial resource strain: Not on file  . Food insecurity:    Worry: Not on file    Inability: Not on file  . Transportation needs:    Medical: Not on file    Non-medical: Not on file  Tobacco Use  . Smoking status: Never Smoker  . Smokeless tobacco: Never Used  Substance and Sexual Activity  . Alcohol use: No  . Drug use: Yes    Types: Methamphetamines, Marijuana    Comment: last used in Aug/Sept??  . Sexual activity: Yes    Birth control/protection: Injection  Lifestyle  . Physical activity:    Days per week: Not on file    Minutes per session: Not on file  . Stress: Not on file  Relationships  . Social connections:    Talks on phone: Not on file    Gets together: Not  on file    Attends religious service: Not on file    Active member of club or organization: Not on file    Attends meetings of clubs or organizations: Not on file    Relationship status: Not on file  . Intimate partner violence:    Fear of current or ex partner: Not on file    Emotionally abused: Not on file    Physically abused: Not on file    Forced sexual activity: Not on file  Other Topics Concern  . Not on file  Social History Narrative  . Not on file    History reviewed. No pertinent family history.  The following portions of the patient's history were reviewed and updated as appropriate: allergies, current medications, past family history, past medical history, past social history, past surgical history and problem list.  Review of Systems Pertinent items are noted in HPI.   Objective:  BP  121/75   Pulse 97   Ht 5' 7.5" (1.715 m)   Wt 138 lb 6.4 oz (62.8 kg)   LMP  (LMP Unknown) Comment: depo shot, irregular periods  BMI 21.36 kg/m  CONSTITUTIONAL: Well-developed, well-nourished female in no acute distress.  EYES: Conjunctivae and EOM are normal.No scleral icterus.  NECK: Normal range of motion, supple, no masses.  Normal thyroid.  SKIN: Skin is warm and dry. Small erythematous cystic lumps on left axilla & 1 on right labis c/w hydradenitis suppurative - no signs of infection PSYCHIATRIC: Normal mood and affect. Normal behavior. Normal judgment and thought content. CARDIOVASCULAR: Normal heart rate noted, regular rhythm RESPIRATORY: Clear to auscultation bilaterally. Effort and breath sounds normal, no problems with respiration noted. BREASTS: Symmetric in size. No masses, skin changes, nipple drainage, or lymphadenopathy. ABDOMEN: Soft, normal bowel sounds, no distention noted.  No tenderness, rebound or guarding.  PELVIC: Declines spec exam. Bimanual - no CMT, uterus normal size, no adnexal mass/tenderness.   Assessment:  Annual gynecologic examination without pap smear. Pap smear up to date, next one due in 2021.   Plan:  1. Encounter for well woman exam - Cervicovaginal ancillary only - HIV Antibody (routine testing w rflx) - RPR - CBC    [name], NP

## 2017-12-18 NOTE — Patient Instructions (Signed)
Health Maintenance, Female Adopting a healthy lifestyle and getting preventive care can go a long way to promote health and wellness. Talk with your health care provider about what schedule of regular examinations is right for you. This is a good chance for you to check in with your provider about disease prevention and staying healthy. In between checkups, there are plenty of things you can do on your own. Experts have done a lot of research about which lifestyle changes and preventive measures are most likely to keep you healthy. Ask your health care provider for more information. Weight and diet Eat a healthy diet  Be sure to include plenty of vegetables, fruits, low-fat dairy products, and lean protein.  Do not eat a lot of foods high in solid fats, added sugars, or salt.  Get regular exercise. This is one of the most important things you can do for your health. ? Most adults should exercise for at least 150 minutes each week. The exercise should increase your heart rate and make you sweat (moderate-intensity exercise). ? Most adults should also do strengthening exercises at least twice a week. This is in addition to the moderate-intensity exercise.  Maintain a healthy weight  Body mass index (BMI) is a measurement that can be used to identify possible weight problems. It estimates body fat based on height and weight. Your health care provider can help determine your BMI and help you achieve or maintain a healthy weight.  For females 20 years of age and older: ? A BMI below 18.5 is considered underweight. ? A BMI of 18.5 to 24.9 is normal. ? A BMI of 25 to 29.9 is considered overweight. ? A BMI of 30 and above is considered obese.  Watch levels of cholesterol and blood lipids  You should start having your blood tested for lipids and cholesterol at 36 years of age, then have this test every 5 years.  You may need to have your cholesterol levels checked more often if: ? Your lipid or  cholesterol levels are high. ? You are older than 36 years of age. ? You are at high risk for heart disease.  Cancer screening Lung Cancer  Lung cancer screening is recommended for adults 55-80 years old who are at high risk for lung cancer because of a history of smoking.  A yearly low-dose CT scan of the lungs is recommended for people who: ? Currently smoke. ? Have quit within the past 15 years. ? Have at least a 30-pack-year history of smoking. A pack year is smoking an average of one pack of cigarettes a day for 1 year.  Yearly screening should continue until it has been 15 years since you quit.  Yearly screening should stop if you develop a health problem that would prevent you from having lung cancer treatment.  Breast Cancer  Practice breast self-awareness. This means understanding how your breasts normally appear and feel.  It also means doing regular breast self-exams. Let your health care provider know about any changes, no matter how small.  If you are in your 20s or 30s, you should have a clinical breast exam (CBE) by a health care provider every 1-3 years as part of a regular health exam.  If you are 40 or older, have a CBE every year. Also consider having a breast X-ray (mammogram) every year.  If you have a family history of breast cancer, talk to your health care provider about genetic screening.  If you are at high risk   for breast cancer, talk to your health care provider about having an MRI and a mammogram every year.  Breast cancer gene (BRCA) assessment is recommended for women who have family members with BRCA-related cancers. BRCA-related cancers include: ? Breast. ? Ovarian. ? Tubal. ? Peritoneal cancers.  Results of the assessment will determine the need for genetic counseling and BRCA1 and BRCA2 testing.  Cervical Cancer Your health care provider may recommend that you be screened regularly for cancer of the pelvic organs (ovaries, uterus, and  vagina). This screening involves a pelvic examination, including checking for microscopic changes to the surface of your cervix (Pap test). You may be encouraged to have this screening done every 3 years, beginning at age 22.  For women ages 56-65, health care providers may recommend pelvic exams and Pap testing every 3 years, or they may recommend the Pap and pelvic exam, combined with testing for human papilloma virus (HPV), every 5 years. Some types of HPV increase your risk of cervical cancer. Testing for HPV may also be done on women of any age with unclear Pap test results.  Other health care providers may not recommend any screening for nonpregnant women who are considered low risk for pelvic cancer and who do not have symptoms. Ask your health care provider if a screening pelvic exam is right for you.  If you have had past treatment for cervical cancer or a condition that could lead to cancer, you need Pap tests and screening for cancer for at least 20 years after your treatment. If Pap tests have been discontinued, your risk factors (such as having a new sexual partner) need to be reassessed to determine if screening should resume. Some women have medical problems that increase the chance of getting cervical cancer. In these cases, your health care provider may recommend more frequent screening and Pap tests.  Colorectal Cancer  This type of cancer can be detected and often prevented.  Routine colorectal cancer screening usually begins at 36 years of age and continues through 36 years of age.  Your health care provider may recommend screening at an earlier age if you have risk factors for colon cancer.  Your health care provider may also recommend using home test kits to check for hidden blood in the stool.  A small camera at the end of a tube can be used to examine your colon directly (sigmoidoscopy or colonoscopy). This is done to check for the earliest forms of colorectal  cancer.  Routine screening usually begins at age 33.  Direct examination of the colon should be repeated every 5-10 years through 36 years of age. However, you may need to be screened more often if early forms of precancerous polyps or small growths are found.  Skin Cancer  Check your skin from head to toe regularly.  Tell your health care provider about any new moles or changes in moles, especially if there is a change in a mole's shape or color.  Also tell your health care provider if you have a mole that is larger than the size of a pencil eraser.  Always use sunscreen. Apply sunscreen liberally and repeatedly throughout the day.  Protect yourself by wearing long sleeves, pants, a wide-brimmed hat, and sunglasses whenever you are outside.  Heart disease, diabetes, and high blood pressure  High blood pressure causes heart disease and increases the risk of stroke. High blood pressure is more likely to develop in: ? People who have blood pressure in the high end of  the normal range (130-139/85-89 mm Hg). ? People who are overweight or obese. ? People who are African American.  If you are 21-29 years of age, have your blood pressure checked every 3-5 years. If you are 3 years of age or older, have your blood pressure checked every year. You should have your blood pressure measured twice-once when you are at a hospital or clinic, and once when you are not at a hospital or clinic. Record the average of the two measurements. To check your blood pressure when you are not at a hospital or clinic, you can use: ? An automated blood pressure machine at a pharmacy. ? A home blood pressure monitor.  If you are between 17 years and 37 years old, ask your health care provider if you should take aspirin to prevent strokes.  Have regular diabetes screenings. This involves taking a blood sample to check your fasting blood sugar level. ? If you are at a normal weight and have a low risk for diabetes,  have this test once every three years after 36 years of age. ? If you are overweight and have a high risk for diabetes, consider being tested at a younger age or more often. Preventing infection Hepatitis B  If you have a higher risk for hepatitis B, you should be screened for this virus. You are considered at high risk for hepatitis B if: ? You were born in a country where hepatitis B is common. Ask your health care provider which countries are considered high risk. ? Your parents were born in a high-risk country, and you have not been immunized against hepatitis B (hepatitis B vaccine). ? You have HIV or AIDS. ? You use needles to inject street drugs. ? You live with someone who has hepatitis B. ? You have had sex with someone who has hepatitis B. ? You get hemodialysis treatment. ? You take certain medicines for conditions, including cancer, organ transplantation, and autoimmune conditions.  Hepatitis C  Blood testing is recommended for: ? Everyone born from [birth year] through 1964. ? Anyone with known risk factors for hepatitis C.  Sexually transmitted infections (STIs)  You should be screened for sexually transmitted infections (STIs) including gonorrhea and chlamydia if: ? You are sexually active and are younger than 36 years of age. ? You are older than 36 years of age and your health care provider tells you that you are at risk for this type of infection. ? Your sexual activity has changed since you were last screened and you are at an increased risk for chlamydia or gonorrhea. Ask your health care provider if you are at risk.  If you do not have HIV, but are at risk, it may be recommended that you take a prescription medicine daily to prevent HIV infection. This is called pre-exposure prophylaxis (PrEP). You are considered at risk if: ? You are sexually active and do not regularly use condoms or know the HIV status of your partner(s). ? You take drugs by injection. ? You are  sexually active with a partner who has HIV.  Talk with your health care provider about whether you are at high risk of being infected with HIV. If you choose to begin PrEP, you should first be tested for HIV. You should then be tested every 3 months for as long as you are taking PrEP. Pregnancy  If you are premenopausal and you may become pregnant, ask your health care provider about preconception counseling.  If you may become  pregnant, take 400 to 800 micrograms (mcg) of folic acid every day.  If you want to prevent pregnancy, talk to your health care provider about birth control (contraception). Osteoporosis and menopause  Osteoporosis is a disease in which the bones lose minerals and strength with aging. This can result in serious bone fractures. Your risk for osteoporosis can be identified using a bone density scan.  If you are 31 years of age or older, or if you are at risk for osteoporosis and fractures, ask your health care provider if you should be screened.  Ask your health care provider whether you should take a calcium or vitamin D supplement to lower your risk for osteoporosis.  Menopause may have certain physical symptoms and risks.  Hormone replacement therapy may reduce some of these symptoms and risks. Talk to your health care provider about whether hormone replacement therapy is right for you. Follow these instructions at home:  Schedule regular health, dental, and eye exams.  Stay current with your immunizations.  Do not use any tobacco products including cigarettes, chewing tobacco, or electronic cigarettes.  If you are pregnant, do not drink alcohol.  If you are breastfeeding, limit how much and how often you drink alcohol.  Limit alcohol intake to no more than 1 drink per day for nonpregnant women. One drink equals 12 ounces of beer, 5 ounces of wine, or 1 ounces of hard liquor.  Do not use street drugs.  Do not share needles.  Ask your health care  provider for help if you need support or information about quitting drugs.  Tell your health care provider if you often feel depressed.  Tell your health care provider if you have ever been abused or do not feel safe at home. This information is not intended to replace advice given to you by your health care provider. Make sure you discuss any questions you have with your health care provider. Document Released: 09/11/2010 Document Revised: 08/04/2015 Document Reviewed: 11/30/2014 Elsevier Interactive Patient Education  2018 [employer].    Hidradenitis Suppurativa Hidradenitis suppurativa is a long-term (chronic) skin disease that starts with blocked sweat glands or hair follicles. Bacteria may grow in these blocked openings of your skin. Hidradenitis suppurativa is like a severe form of acne that develops in areas of your body where acne would be unusual. It is most likely to affect the areas of your body where skin rubs against skin and becomes moist. This includes your:  Underarms.  Groin.  Genital areas.  Buttocks.  Upper thighs.  Breasts.  Hidradenitis suppurativa may start out with small pimples. The pimples can develop into deep sores that break open (rupture) and drain pus. Over time your skin may thicken and become scarred. Hidradenitis suppurativa cannot be passed from person to person. What are the causes? The exact cause of hidradenitis suppurativa is not known. This condition may be due to:  Female and female hormones. The condition is rare before and after puberty.  An overactive body defense system (immune system). Your immune system may overreact to the blocked hair follicles or sweat glands and cause swelling and pus-filled sores.  What increases the risk? You may have a higher risk of hidradenitis suppurativa if you:  Are a woman.  Are between ages 44 and 48.  Have a family history of hidradenitis suppurativa.  Have a personal history of acne.  Are  overweight.  Smoke.  Take the drug lithium.  What are the signs or symptoms? The first signs of  an outbreak are usually painful skin bumps that look like pimples. As the condition progresses:  Skin bumps may get bigger and grow deeper into the skin.  Bumps under the skin may rupture and drain smelly pus.  Skin may become itchy and infected.  Skin may thicken and scar.  Drainage may continue through tunnels under the skin (fistulas).  Walking and moving your arms can become painful.  How is this diagnosed? Your health care provider may diagnose hidradenitis suppurativa based on your medical history and your signs and symptoms. A physical exam will also be done. You may need to see a health care provider who specializes in skin diseases (dermatologist). You may also have tests done to confirm the diagnosis. These can include:  Swabbing a sample of pus or drainage from your skin so it can be sent to the lab and tested for infection.  Blood tests to check for infection.  How is this treated? The same treatment will not work for everybody with hidradenitis suppurativa. Your treatment will depend on how severe your symptoms are. You may need to try several treatments to find what works best for you. Part of your treatment may include cleaning and bandaging (dressing) your wounds. You may also have to take medicines, such as the following:  Antibiotics.  Acne medicines.  Medicines to block or suppress the immune system.  A diabetes medicine (metformin) is sometimes used to treat this condition.  For women, birth control pills can sometimes help relieve symptoms.  You may need surgery if you have a severe case of hidradenitis suppurativa that does not respond to medicine. Surgery may involve:  Using a laser to clear the skin and remove hair follicles.  Opening and draining deep sores.  Removing the areas of skin that are diseased and scarred.  Follow these instructions at  home:  Learn as much as you can about your disease, and work closely with your health care providers.  Take medicines only as directed by your health care provider.  If you were prescribed an antibiotic medicine, finish it all even if you start to feel better.  If you are overweight, losing weight may be very helpful. Try to reach and maintain a healthy weight.  Do not use any tobacco products, including cigarettes, chewing tobacco, or electronic cigarettes. If you need help quitting, ask your health care provider.  Do not shave the areas where you get hidradenitis suppurativa.  Do not wear deodorant.  Wear loose-fitting clothes.  Try not to overheat and get sweaty.  Take a daily bleach bath as directed by your health care provider. ? Fill your bathtub halfway with water. ? Pour in  cup of unscented household bleach. ? Soak for 5-10 minutes.  Cover sore areas with a warm, clean washcloth (compress) for 5-10 minutes. Contact a health care provider if:  You have a flare-up of hidradenitis suppurativa.  You have chills or a fever.  You are having trouble controlling your symptoms at home. This information is not intended to replace advice given to you by your health care provider. Make sure you discuss any questions you have with your health care provider. Document Released: 10/11/2003 Document Revised: 08/04/2015 Document Reviewed: 05/29/2013 Elsevier Interactive Patient Education  2018 [employer].

## 2017-12-19 LAB — RPR: RPR Ser Ql: NONREACTIVE

## 2017-12-19 LAB — CBC
Hematocrit: 37 % (ref 34.0–46.6)
Hemoglobin: 12.3 g/dL (ref 11.1–15.9)
MCH: 27.5 pg (ref 26.6–33.0)
MCHC: 33.2 g/dL (ref 31.5–35.7)
MCV: 83 fL (ref 79–97)
Platelets: 295 10*3/uL (ref 150–450)
RBC: 4.47 x10E6/uL (ref 3.77–5.28)
RDW: 12.7 % (ref 12.3–15.4)
WBC: 6.3 10*3/uL (ref 3.4–10.8)

## 2017-12-19 LAB — HIV ANTIBODY (ROUTINE TESTING W REFLEX): HIV Screen 4th Generation wRfx: NONREACTIVE

## 2017-12-20 LAB — CERVICOVAGINAL ANCILLARY ONLY
Bacterial vaginitis: NEGATIVE
Candida vaginitis: NEGATIVE
Chlamydia: NEGATIVE
Neisseria Gonorrhea: POSITIVE — AB
Trichomonas: NEGATIVE

## 2017-12-23 ENCOUNTER — Telehealth: Payer: Self-pay | Admitting: *Deleted

## 2017-12-23 NOTE — Telephone Encounter (Signed)
1944  Called pt and left new voicemail message to return the call to our office at her earliest convenience.

## 2017-12-23 NOTE — Telephone Encounter (Signed)
Called pt to inform her of positive gonorrhea results.  Pt did not pick up. Left voicemail requesting that pt call the office.  STD card completed

## 2017-12-24 ENCOUNTER — Encounter: Payer: Self-pay | Admitting: *Deleted

## 2017-12-24 NOTE — Telephone Encounter (Signed)
LM that this is our third attempt in trying to reach you to please give the office a call.  Letter sent.

## 2017-12-31 ENCOUNTER — Ambulatory Visit (INDEPENDENT_AMBULATORY_CARE_PROVIDER_SITE_OTHER): Payer: Medicaid Other | Admitting: *Deleted

## 2017-12-31 ENCOUNTER — Encounter: Payer: Self-pay | Admitting: *Deleted

## 2017-12-31 VITALS — BP 115/72 | HR 83 | Wt 136.0 lb

## 2017-12-31 DIAGNOSIS — A549 Gonococcal infection, unspecified: Secondary | ICD-10-CM | POA: Diagnosis not present

## 2017-12-31 MED ORDER — AZITHROMYCIN 250 MG PO TABS
1000.0000 mg | ORAL_TABLET | Freq: Once | ORAL | Status: AC
  Administered 2017-12-31: 1000 mg via ORAL

## 2017-12-31 MED ORDER — CEFTRIAXONE SODIUM 250 MG IJ SOLR
250.0000 mg | Freq: Once | INTRAMUSCULAR | Status: AC
  Administered 2017-12-31: 250 mg via INTRAMUSCULAR

## 2017-12-31 NOTE — Progress Notes (Signed)
Chart reviewed for nurse visit. Agree with plan of care.   Marylene Land, CNM 12/31/2017 6:53 PM

## 2017-12-31 NOTE — Progress Notes (Signed)
Kristin Hart here for Rocephin  Injection.  Injection administered without complication.  Pt also given 1g Zithromax for treatment of gonorrhea.  Pt instructed to abstain from sex until 2 weeks after her partner has been treated.  Advised pt that partner can be treated at the health department.  Pt verbalized understanding.   Pt also presents with infected area on leg that is warm and tender to the touch, red and has an open area.  Pt reports it started a week ago and she has been putting hydrogen peroxide and neosporin on it but it is only getting bigger.  Pt denies going to any other physician to be seen for this.  The central location is 1 inch x.75 inches.  With redness extending about 0.5 inches around the entire wound.  The opening is 0.25 in x 0.25 in and a scant amount of  purulent drainage noted.  The pt has a swollen area that spreads further out that is 3.5in x 4in  Luna Kitchens CNM assessed the area and agreed with RN recommendation that pt go to an urgent care directly from this office.  Pt verbalized understanding and reported that she would go straight to an urgent care.   DIANNIE WILLNER, RN 12/31/2017  5:38 PM

## 2018-01-08 ENCOUNTER — Ambulatory Visit: Payer: Self-pay

## 2018-01-13 ENCOUNTER — Telehealth: Payer: Self-pay | Admitting: Family Medicine

## 2018-01-13 NOTE — Telephone Encounter (Signed)
LVM for patient with her rescheduled depo injection appointment on 114/6/19 @ 10

## 2018-01-14 ENCOUNTER — Encounter

## 2018-01-15 ENCOUNTER — Ambulatory Visit: Payer: Self-pay

## 2018-01-23 ENCOUNTER — Ambulatory Visit (INDEPENDENT_AMBULATORY_CARE_PROVIDER_SITE_OTHER): Payer: Medicaid Other | Admitting: *Deleted

## 2018-01-23 VITALS — BP 132/77 | HR 90

## 2018-01-23 DIAGNOSIS — Z3042 Encounter for surveillance of injectable contraceptive: Secondary | ICD-10-CM | POA: Diagnosis not present

## 2018-01-23 DIAGNOSIS — N898 Other specified noninflammatory disorders of vagina: Secondary | ICD-10-CM

## 2018-01-23 DIAGNOSIS — Z3202 Encounter for pregnancy test, result negative: Secondary | ICD-10-CM | POA: Diagnosis not present

## 2018-01-23 LAB — POCT PREGNANCY, URINE: Preg Test, Ur: NEGATIVE

## 2018-01-23 MED ORDER — FLUCONAZOLE 150 MG PO TABS: 150.0000 mg | ORAL_TABLET | Freq: Once | ORAL | 0 refills | Status: AC

## 2018-01-23 MED ORDER — MEDROXYPROGESTERONE ACETATE 150 MG/ML IM SUSP
150.0000 mg | Freq: Once | INTRAMUSCULAR | Status: AC
  Administered 2018-01-23: 150 mg via INTRAMUSCULAR

## 2018-01-23 NOTE — Progress Notes (Signed)
Chart reviewed for nurse visit. Agree with plan of care.   Judeth HornLawrence, Breckyn Troyer, NP 01/23/2018 3:07 PM

## 2018-01-23 NOTE — Progress Notes (Signed)
Eveny R FVeronia Beetsisher here for depo-provera  Injection.  Injection administered without complication. Patient will return in in 12 weeks for next injection. Patient is one day late for depo-provera. UPT obtained and was negative. Also c/o vaginal itching and requested refill of diflucan which I ordered rx.   Simona HuhZeyfang, Bertis Hustead Lusk, RN 01/23/2018  2:28 PM

## 2018-04-10 ENCOUNTER — Ambulatory Visit (INDEPENDENT_AMBULATORY_CARE_PROVIDER_SITE_OTHER): Payer: Medicaid Other | Admitting: *Deleted

## 2018-04-10 VITALS — BP 145/75 | HR 88 | Ht 67.5 in | Wt 149.6 lb

## 2018-04-10 DIAGNOSIS — Z3042 Encounter for surveillance of injectable contraceptive: Secondary | ICD-10-CM | POA: Diagnosis present

## 2018-04-10 MED ORDER — MEDROXYPROGESTERONE ACETATE 150 MG/ML IM SUSP
150.0000 mg | Freq: Once | INTRAMUSCULAR | Status: AC
  Administered 2018-04-10: 150 mg via INTRAMUSCULAR

## 2018-04-10 NOTE — Progress Notes (Signed)
Depo Provera 150 mg IM administered as scheduled. Pt tolerated well. Next injection due 4/17-5/1. Annual Gyn exam due after 12/19/18. Noted mild BP elevation which pt has had in the past per chart review. Pt denies headaches but states she feels exhausted. She asked what could cause her to have high BP. Pt was advised there are multiple possibilities and that her values are not consistently high. She has PCP - Dr. Linwood Dibbles @ [facility] - last seen 05/31/17. Pt encouraged to schedule follow up appt @ [facility]. Pt voiced understanding of instructions given.

## 2018-04-12 NOTE — Progress Notes (Signed)
I agree with the nurses note and plan of care.  Duane Lope, NP 04/12/2018 9:52 PM

## 2018-06-18 ENCOUNTER — Encounter: Payer: Self-pay | Admitting: *Deleted

## 2018-07-03 ENCOUNTER — Ambulatory Visit: Payer: Self-pay

## 2018-07-15 ENCOUNTER — Ambulatory Visit: Payer: Medicaid Other

## 2018-08-14 ENCOUNTER — Ambulatory Visit (INDEPENDENT_AMBULATORY_CARE_PROVIDER_SITE_OTHER): Payer: Medicaid Other

## 2018-08-14 ENCOUNTER — Other Ambulatory Visit: Payer: Self-pay

## 2018-08-14 DIAGNOSIS — Z3042 Encounter for surveillance of injectable contraceptive: Secondary | ICD-10-CM | POA: Diagnosis not present

## 2018-08-14 LAB — POCT PREGNANCY, URINE: Preg Test, Ur: NEGATIVE

## 2018-08-14 MED ORDER — MEDROXYPROGESTERONE ACETATE 150 MG/ML IM SUSP
150.0000 mg | Freq: Once | INTRAMUSCULAR | Status: AC
  Administered 2018-08-14: 150 mg via INTRAMUSCULAR

## 2018-08-14 NOTE — Progress Notes (Signed)
Kristin Hart here for Depo-Provera  Injection.  Injection administered without complication. Patient will return in 3 months for next injection. Pt requested to have an appt with a Asher Muir due to her being homeless and the need for resources.  Informed pt that someone from the front office will contact with her an appt for [facility].  Pt agreed.   Ralene Bathe, RN 08/14/2018  12:09 PM

## 2018-08-17 ENCOUNTER — Emergency Department (HOSPITAL_COMMUNITY): Payer: Medicaid Other

## 2018-08-17 ENCOUNTER — Emergency Department (HOSPITAL_COMMUNITY)
Admission: EM | Admit: 2018-08-17 | Discharge: 2018-08-17 | Disposition: A | Discharge status: Home / Self Care | Payer: Medicaid Other | Attending: Emergency Medicine | Admitting: Emergency Medicine

## 2018-08-17 ENCOUNTER — Encounter (HOSPITAL_COMMUNITY): Payer: Self-pay

## 2018-08-17 ENCOUNTER — Other Ambulatory Visit: Payer: Self-pay

## 2018-08-17 DIAGNOSIS — J45909 Unspecified asthma, uncomplicated: Secondary | ICD-10-CM | POA: Insufficient documentation

## 2018-08-17 DIAGNOSIS — L249 Irritant contact dermatitis, unspecified cause: Secondary | ICD-10-CM | POA: Insufficient documentation

## 2018-08-17 DIAGNOSIS — E119 Type 2 diabetes mellitus without complications: Secondary | ICD-10-CM | POA: Diagnosis not present

## 2018-08-17 DIAGNOSIS — R21 Rash and other nonspecific skin eruption: Secondary | ICD-10-CM | POA: Diagnosis present

## 2018-08-17 DIAGNOSIS — M25532 Pain in left wrist: Secondary | ICD-10-CM | POA: Diagnosis not present

## 2018-08-17 DIAGNOSIS — E039 Hypothyroidism, unspecified: Secondary | ICD-10-CM | POA: Insufficient documentation
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
## 2018-08-17 MED ORDER — HYDROCORTISONE 1 % EX CREA: TOPICAL_CREAM | CUTANEOUS | 0 refills | Status: AC

## 2018-08-17 MED ORDER — DIPHENHYDRAMINE HCL 25 MG PO TABS: 25.0000 mg | ORAL_TABLET | Freq: Four times a day (QID) | ORAL | 0 refills | Status: DC | PRN

## 2018-08-17 MED ORDER — PREDNISONE 20 MG PO TABS: 40.0000 mg | ORAL_TABLET | Freq: Every day | ORAL | 0 refills | Status: AC

## 2018-08-17 NOTE — Discharge Instructions (Signed)
Evaluated today for wrist injury and poison ivy.  Take medications as prescribed.  You may take Tylenol as needed for your wrist pain.  Follow-up with orthopedics.  Return to the ED for any new worsening symptoms.
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
## 2018-08-18 NOTE — BH Specialist Note (Signed)
Pt did not arrive to [facility] video visit, and did not answer phone. Pt's MyChart is pending; Left HIPPA-compliant message to call back Roselyn Reef from [facility] for [employer] at [phone].    Integrated Behavioral Health Visit via Telemedicine (Telephone)  08/21/2018 Kristin Hart [id number]   Garlan Fair

## 2018-08-19 ENCOUNTER — Encounter: Payer: Self-pay | Admitting: *Deleted

## 2018-08-21 ENCOUNTER — Other Ambulatory Visit: Payer: Self-pay

## 2018-08-21 ENCOUNTER — Ambulatory Visit: Payer: Medicaid Other | Admitting: Clinical

## 2018-08-25 ENCOUNTER — Telehealth: Payer: Self-pay | Admitting: Lactation Services

## 2018-08-25 NOTE — Telephone Encounter (Signed)
Pt called and left message on nurse voicemail returning [facility], LCSW call. Message routed to [facility] for follow up.

## 2018-10-30 ENCOUNTER — Ambulatory Visit: Payer: Medicaid Other

## 2018-11-14 ENCOUNTER — Other Ambulatory Visit: Payer: Self-pay

## 2018-11-14 ENCOUNTER — Encounter: Payer: Self-pay | Admitting: Family Medicine

## 2018-11-14 ENCOUNTER — Ambulatory Visit (INDEPENDENT_AMBULATORY_CARE_PROVIDER_SITE_OTHER): Payer: Medicaid Other | Admitting: Family Medicine

## 2018-11-14 VITALS — BP 110/60 | HR 96 | Ht 67.0 in | Wt 129.5 lb

## 2018-11-14 DIAGNOSIS — F32A Depression, unspecified: Secondary | ICD-10-CM

## 2018-11-14 DIAGNOSIS — F419 Anxiety disorder, unspecified: Secondary | ICD-10-CM

## 2018-11-14 DIAGNOSIS — R7303 Prediabetes: Secondary | ICD-10-CM

## 2018-11-14 DIAGNOSIS — J4521 Mild intermittent asthma with (acute) exacerbation: Secondary | ICD-10-CM | POA: Diagnosis not present

## 2018-11-14 DIAGNOSIS — F329 Major depressive disorder, single episode, unspecified: Secondary | ICD-10-CM | POA: Diagnosis not present

## 2018-11-14 DIAGNOSIS — Z23 Encounter for immunization: Secondary | ICD-10-CM | POA: Diagnosis not present

## 2018-11-14 LAB — POCT GLYCOSYLATED HEMOGLOBIN (HGB A1C): HbA1c, POC (controlled diabetic range): 6.7 % (ref 0.0–7.0)

## 2018-11-14 MED ORDER — FLUTICASONE PROPIONATE HFA 44 MCG/ACT IN AERO: 2.0000 | INHALATION_SPRAY | Freq: Two times a day (BID) | RESPIRATORY_TRACT | 12 refills | Status: DC
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
## 2018-11-14 NOTE — Patient Instructions (Addendum)
It was great to see you!  Our plans for today:  - See below for psychiatry, therapy and counseling options. I think it is very important to establish with psychiatry given your history. - See below for resources to help with housing and food. - We provided a controller inhaler for your asthma. Take this every day regardless if you feel good or bad. Use the albuterol inhaler as needed for shortness of breath or wheezing.  - Come back in 3 months for a repeat A1c.  We are checking some labs today, we will call you or send you a letter if they are abnormal.   Take care and seek immediate care sooner if you develop any concerns.   Dr. Mollie Germanyumball [facility]  Psychiatry Resource List (Adults and Children) Most of these providers will take Medicaid. please consult your insurance for a complete and updated list of available providers. When calling to make an appointment have your insurance information available to confirm you are covered.  [facility]:   [place]: [address]     [phone] [place]: [address],        [phone] [place]: [address],    [phone] [place]: [address],                   [phone]  [facility]  (Psychiatry & counseling ; adults & children ; will take Medicaid) [address], [place], [place]        [phone]   [facility]  (Psychiatry only; Adults only, will take Medicaid)  [address], [place], [place] [address]       [phone]   [facility] (Psychiatry & counseling ; adults & children ; will take Medicaid [address]  [place] [address]   [phone]    (Spanish therapist)  [facility]  Psychiatry & counseling; Adults and children;  [address]    [place], [place] [address]    [phone]  [facility] (Psychiatry &  counseling; adults & children; Medicare no Medicaid)  [address]   [place], [place]  [address]      [phone]    [facility] (up to age 521)  Psychiatry & counseling ,will take Medicaid, must do counseling to receive psychiatry services  [address] [place] [address]        [phone]  [facility] (Psychiatry & counseling; adults & children; will take Medicaid) [address],  [place], [place]  [phone]  [facility]---  Walk-in Mon-Fri, 8:30-5:00 (will take Medicaid)  [address], [place], [place]  [phone]    [facility] --- Walk-In Mon-Friday 8am-3pm ( will take Medicaid, Psychiatry, Adults & children,  [address], [place], [place]   [phone]   [facility]--, Walk-in M-F 8am-12pm and 1pm -3pm   (Counseling, Psychiatry, will take Medicaid, adults & children)  [address], [place], [place]  [phone]    Family Medicine [facility] [employer]Community Resource Sheet Transportation Resources:  Marland Kitchen. [place] Transit Authority: reduced-fare bus ID available with unexpired Medicaid/Medicare or the orange card/ call [phone] to confirm hours to obtain ID card . Medicaid Transportation Medical appointment: Call [phone] to apply . SCAT Paratransit services: Eligible riders only, call [phone] for an application . Non- Medicaid  Transportation ([place]) Call for more information [phone] . Medicare Transportation- contact your provider to see if they offer transportation to medical appointments. The phone number is listed on the back of your card. Nurse, children's  . IT trainer Age 37 & older [phone] (HP) [phone] (GSB)  . [place] for non-medical rides,  Age 9 or older  [phone] . [place] only --Dial a Ride for medical and non-medical,  Age 91 or older   [phone] . [place]  Association:   313-649-6710 Housing Resources: Marland Kitchen [employer]:   www.gha-[place].org/  [phone] [place], [place], [place] [address]   . [place] Housing Medical sales representative.NCHousingSearch.org   [phone]  . [place]:  [phone]     [place], [place], [place] [address]    [place]:  [phone]     [place], [place], [place] [address]    . Affordable Housing Management:  [phone]  http://www.CreditChaos.com.ee.cfm  [address] [place] [address] [place], [place] [address]  . [employer]: [place] By appointment only - Call [phone]   . Homeless: Museum/gallery curator [facility]) [phone]   [address] . [employer] (Research scientist (medical))  www.GreenTheaters.com.au  [place].  Quintella Reichert [place], [place] [address]   [phone]  (For homeowners only)  Possible Utility Resources                                                                            [place] www.WRLP.net   [phone]  e-mail info@wrlp .net   2. [employer], [place] [phone] or [phone] (some housing help available, priority goes to [place] shelter residents with an income) 3. [place] (assistance available as funding permits) [address], [place] or [place] [facility] [employer] [phone]  4. [place] [address] [phone]   (up to $50 only) 5. [employer] [phone] 8:30-5:00 6. [facility] [phone] M, T, Th 10:00am-2:00pm 7. [facility] [phone], extension [phone]  T, Th 10:00am-1:00pm 8. [facility]), [phone] 8. Solicitor [facility]), [address] [phone] Chiropractor . [place]:  [phone]    [address]  [place] (2nd floor) Walk-in hours M-F 8:30 to 4:30    www.[email or web address].[email or web address]/family-[email or web address]-center Victims of domestic violence, child abuse, sexual assault, & elder abuse  . Family Service of the [employer] Crisis line:  [phone] (GSB)  &  ([phone] (HP) [address] [place]     Domestic violence, rape and victim assistance         . [employer] [facility] [place] Recovery Program  www.kellinfoundation.org  [address] Jacinto Reap  [place], [place] [address]     [phone]     Family violence, domestic violence, traumatic or stressful life events  . Women's Resource Center:  [address] [place], [place]  Call [phone]  (food and clothing referrals, legal advice, etc.).  Food Resources . SNAP/ Food benefits: [facility] [phone] [address] [place] ;     [address] [facility]) Nutrition program [place] [phone];     [place] [phone]    . Neighbors Helping Neighbors   Free Produce & Food  (Every Thursday 9AM to 11:00AM) [employer] Seventh Day [employer]      [phone] [address] [place]  . Agricultural engineer Assistance:  [address] [place], [place]   [phone] [address] [place]  [phone]  Legal Aid Helpline Toll-free: 314-790-3081 [phone]) [phone]) . Senior Legal Helpline : 318-093-7993 Hours: Monday - Friday: 9-11 a.m. & 1-3 p.m. Free legal help for [place]ians 74 years of age or older. No income limitations apply. . [place] Navigator Helpline : 217-651-2056 Hours: Monday - Friday: 10 a.m. - 4 p.m.  Free help with health insurance on the [employer] [place] Housing Helpline 737-537-1137 856-290-2119) : Free help with housing discrimination. . Battered Immigrant Helpline : 218-137-5263 Hours: Thursdays: 9 a.m. - 4 p.m. Free help for immigrants who are victims of domestic  violence.  [place] 211: Confidential 24/7/365  with Multilingual for all community resources. Three ways to obtain information: 1. Dial 211 2. ([phone]  3. Search online: [email or web address]-[email or web address]/

## 2018-11-15 ENCOUNTER — Encounter: Payer: Self-pay | Admitting: Family Medicine

## 2018-11-15 NOTE — Assessment & Plan Note (Signed)
Continues to battle unstable home situation. Previously established with Kristin Hart, was seeing counseling with OB/GYN after birth of her son but hasn't been seen in a while, per chart review missed last appointment. Gave resources for therapy and community resources for housing and food, see AVS for details. Follow up with [facility] regarding medication considering prior d/o bipolar vs borderline personality.

## 2018-11-15 NOTE — Assessment & Plan Note (Signed)
A1c 6.7 today. Discussed initiating metformin today but given patient's difficulties with home situation and obtaining food, will focus on establishing with community resources and follow up in 3 months for repeat A1c.

## 2018-11-15 NOTE — Assessment & Plan Note (Addendum)
Symptomatic despite daily albuterol use. Will provide low dose ICS for controller. Asthma Action plan reviewed.

## 2018-12-31 ENCOUNTER — Emergency Department (HOSPITAL_COMMUNITY)
Admission: EM | Admit: 2018-12-31 | Discharge: 2018-12-31 | Disposition: A | Discharge status: Home / Self Care | Payer: Medicaid Other | Attending: Emergency Medicine | Admitting: Emergency Medicine

## 2018-12-31 ENCOUNTER — Encounter: Payer: Self-pay | Admitting: Family Medicine

## 2018-12-31 ENCOUNTER — Other Ambulatory Visit: Payer: Self-pay

## 2018-12-31 ENCOUNTER — Encounter (HOSPITAL_COMMUNITY): Payer: Self-pay | Admitting: Emergency Medicine

## 2018-12-31 DIAGNOSIS — J45909 Unspecified asthma, uncomplicated: Secondary | ICD-10-CM | POA: Insufficient documentation

## 2018-12-31 DIAGNOSIS — L292 Pruritus vulvae: Secondary | ICD-10-CM | POA: Diagnosis present

## 2018-12-31 DIAGNOSIS — E119 Type 2 diabetes mellitus without complications: Secondary | ICD-10-CM | POA: Diagnosis not present

## 2018-12-31 DIAGNOSIS — N898 Other specified noninflammatory disorders of vagina: Secondary | ICD-10-CM | POA: Insufficient documentation

## 2018-12-31 DIAGNOSIS — B3731 Acute candidiasis of vulva and vagina: Secondary | ICD-10-CM

## 2018-12-31 DIAGNOSIS — B029 Zoster without complications: Secondary | ICD-10-CM | POA: Insufficient documentation

## 2018-12-31 DIAGNOSIS — B373 Candidiasis of vulva and vagina: Secondary | ICD-10-CM | POA: Diagnosis not present

## 2018-12-31 DIAGNOSIS — E039 Hypothyroidism, unspecified: Secondary | ICD-10-CM | POA: Insufficient documentation

## 2018-12-31 DIAGNOSIS — B20 Human immunodeficiency virus [HIV] disease: Secondary | ICD-10-CM | POA: Insufficient documentation

## 2018-12-31 LAB — I-STAT BETA HCG BLOOD, ED (MC, WL, AP ONLY): I-stat hCG, quantitative: <5 m[IU]/mL (ref <5)

## 2018-12-31 LAB — URINALYSIS, ROUTINE W REFLEX MICROSCOPIC
Bilirubin Urine: NEGATIVE
Glucose, UA: NEGATIVE mg/dL
Hgb urine dipstick: NEGATIVE
Ketones, ur: NEGATIVE mg/dL
Nitrite: NEGATIVE
Protein, ur: NEGATIVE mg/dL
Specific Gravity, Urine: 1.018 (ref 1.005–1.030)
pH: 5 (ref 5.0–8.0)

## 2018-12-31 LAB — WET PREP, GENITAL
Clue Cells Wet Prep HPF POC: NONE SEEN
Sperm: NONE SEEN
Trich, Wet Prep: NONE SEEN

## 2018-12-31 LAB — HIV ANTIBODY (ROUTINE TESTING W REFLEX): HIV Screen 4th Generation wRfx: REACTIVE — AB

## 2018-12-31 LAB — RPR: RPR Ser Ql: NONREACTIVE

## 2018-12-31 MED ORDER — VALACYCLOVIR HCL 1 G PO TABS: 1000.0000 mg | ORAL_TABLET | Freq: Three times a day (TID) | ORAL | 0 refills | Status: DC

## 2018-12-31 MED ORDER — HYDROCODONE-ACETAMINOPHEN 5-325 MG PO TABS: 1.0000 | ORAL_TABLET | Freq: Four times a day (QID) | ORAL | 0 refills | Status: DC | PRN

## 2018-12-31 MED ORDER — VALACYCLOVIR HCL 500 MG PO TABS
1000.0000 mg | ORAL_TABLET | Freq: Once | ORAL | Status: AC
  Administered 2018-12-31: 1000 mg via ORAL
  Filled 2018-12-31: qty 2

## 2018-12-31 MED ORDER — FLUCONAZOLE 150 MG PO TABS
150.0000 mg | ORAL_TABLET | Freq: Once | ORAL | Status: AC
  Administered 2018-12-31: 150 mg via ORAL
  Filled 2018-12-31: qty 1

## 2018-12-31 NOTE — ED Triage Notes (Signed)
Pt reports rash to abd and back for multiple days. Also endorses vaginal itching and worried about yeast infection or UTI. Pt reports missing last depo shot and unsure of LMP.

## 2018-12-31 NOTE — ED Provider Notes (Signed)
[facility] EMERGENCY DEPARTMENT Provider Note   CSN: [id number] Arrival date & time: 12/31/18  0800     History   Chief Complaint Chief Complaint  Patient presents with  . Herpes Zoster  . Vaginal Itching    HPI Kristin Hart is a 38 y.o. female who presents with rash and vaginal discomfort.  Patient states that she noticed pain, itching and development of a rash over the past 3 days along the left lateral rib cage.  She states "I think this might be shingles."  She denies any fever or chills.  The left rash is only located on one side.  Patient states that she is also having significant vaginal discomfort including burning, itching.  She states it is mostly vaginal.  She also has a bump on her labia that she is concerned about.  She is sexually active with a single female partner but states," but I know he is not only with me."  She last had intercourse about 1 week ago.  She denies urinary symptoms, nausea, vomiting, diarrhea.    HPI  Past Medical History:  Diagnosis Date  . Asthma   . 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 Diabetes mellitus without complication (HCC)    type 2  . Foreign body in small intestine   . Hypothyroidism   . Stomach ulcer     Patient Active Problem List   Diagnosis Date Noted  . Anxiety 05/29/2017  . Self-mutilation 05/29/2017  . Depression 06/07/2016  . Marijuana use 06/07/2016  . Low vitamin D level 03/15/2016  . Prediabetes 02/22/2016  . Asthma 02/22/2016  . ADD (attention deficit disorder) 02/22/2016  . 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 Hyperlipidemia with target LDL less than 70 01/31/2012  . Migraine 01/31/2012  . Tremor, 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    Past Surgical History:  Procedure Laterality Date  . CESAREAN SECTION N/A 07/04/2016   Procedure: CESAREAN SECTION;  Surgeon: [name], DO;  Location: [facility] BIRTHING SUITES;  Service: Obstetrics;  Laterality: N/A;   . ENTEROSCOPY N/A 11/08/2017   Procedure: ENTEROSCOPY;  Surgeon: Hilarie Fredrickson, MD;  Location: [facility] ENDOSCOPY;  Service: Endoscopy;  Laterality: N/A;  . NO PAST SURGERIES       OB History    Gravida  1   Para  1   Term  1   Preterm      AB      Living        SAB      TAB      Ectopic      Multiple  0   Live Births               Home Medications    Prior to Admission medications   Medication Sig Start Date End Date Taking? Authorizing Provider  albuterol (PROAIR HFA) 108 (90 Base) MCG/ACT inhaler Inhale 1 puff into the lungs every 4 (four) hours as needed.  06/11/16   [provider]  diphenhydrAMINE (BENADRYL) 25 MG tablet Take 1 tablet (25 mg total) by mouth every 6 (six) hours as needed. 08/17/18   Henderly, Britni A, PA-C  fluticasone (FLOVENT HFA) 44 MCG/ACT inhaler Inhale 2 puffs into the lungs 2 (two) times daily. 11/14/18   Ellwood Dense, DO  hydrocortisone cream 1 % Apply to affected area 2 times daily 08/17/18   Henderly, Britni A, PA-C  ibuprofen (ADVIL,MOTRIN) 600 MG tablet Take 1 tablet (600  mg total) by mouth every 6 (six) hours as needed. Patient taking differently: Take 800 mg by mouth every 6 (six) hours as needed.  07/07/16   Myrtis Ser, CNM  mupirocin ointment (BACTROBAN) 2 % APPLY SPARINGLY TO AFFECTED AREA 3 TIMES A DAY FOR 10 DAYS 01/11/18   [provider]  naproxen (NAPROSYN) 375 MG tablet Take 1 tablet (375 mg total) by mouth 2 (two) times daily. 05/16/17   Irelyn Murrain, NP    Family History No family history on file.  Social History Social History   Tobacco Use  . Smoking status: Never Smoker  . Smokeless tobacco: Never Used  Substance Use Topics  . Alcohol use: No  . Drug use: Yes    Types: Methamphetamines, Marijuana    Comment: last used in Aug/Sept??     Allergies   Patient has no known allergies.   Review of Systems Review of Systems Ten systems reviewed and are negative for acute change, except as  noted in the HPI.    Physical Exam Updated Vital Signs BP (!) 137/41 (BP Location: Left Arm)   Pulse 99   Temp 98.4 F (36.9 C) (Oral)   Resp 18   Ht 5' 7.5" (1.715 m)   SpO2 100%   BMI 19.98 kg/m   Physical Exam Vitals signs and nursing note reviewed. Exam conducted with a chaperone present.  Constitutional:      General: She is not in acute distress.    Appearance: She is well-developed. She is not diaphoretic.  HENT:     Head: Normocephalic and atraumatic.  Eyes:     General: No scleral icterus.    Conjunctiva/sclera: Conjunctivae normal.  Neck:     Musculoskeletal: Normal range of motion.  Cardiovascular:     Rate and Rhythm: Normal rate and regular rhythm.     Heart sounds: Normal heart sounds. No murmur. No friction rub. No gallop.   Pulmonary:     Effort: Pulmonary effort is normal. No respiratory distress.     Breath sounds: Normal breath sounds.  Abdominal:     General: Bowel sounds are normal. There is no distension.     Palpations: Abdomen is soft. There is no mass.     Tenderness: There is no abdominal tenderness. There is no guarding.  Genitourinary:    Vagina: Vaginal discharge and erythema present.     Cervix: No cervical motion tenderness.     Uterus: Normal. Not enlarged.      Adnexa: Right adnexa normal and left adnexa normal.       Right: No mass or tenderness.         Left: No mass or tenderness.      Skin:    General: Skin is warm and dry.     Findings: Rash present. Rash is crusting and vesicular.          Comments: Unilateral rash on the left side with patches of vesicles in various states of eruption along a single dermatome.  Neurological:     Mental Status: She is alert and oriented to person, place, and time.  Psychiatric:        Behavior: Behavior normal.      ED Treatments / Results  Labs (all labs ordered are listed, but only abnormal results are displayed) Labs Reviewed  WET PREP, GENITAL  RPR  URINALYSIS, ROUTINE W  REFLEX MICROSCOPIC  HIV ANTIBODY (ROUTINE TESTING W REFLEX)  I-STAT BETA HCG BLOOD, ED (MC, WL, AP  ONLY)  GC/CHLAMYDIA PROBE AMP ([place]) NOT AT [facility]    EKG None  Radiology No results found.  Procedures Procedures (including critical care time)  Medications Ordered in ED Medications  valACYclovir (VALTREX) tablet 1,000 mg (has no administration in time range)     Initial Impression / Assessment and Plan / ED Course  I have reviewed the triage vital signs and the nursing notes.  Pertinent labs & imaging results that were available during my care of the patient were reviewed by me and considered in my medical decision making (see chart for details).  Clinical Course as of Dec 31 1742  Wed Dec 31, 2018  1744 HIV Screen 4th Generation wRfx(!): Reactive [AH]    Clinical Course User Index [AH] Arthor CaptainHarris, Canna Nickelson, PA-C       38 year old female past medical history  prediabetes, depression, history of amphetamine abuse.  Clinical evaluation of the patient's rash shows obvious herpes zoster outbreak in the left T6/7 dermatome..  I personally reviewed the patient's labs which shows yeast infection.  Patient HIV and GC chlamydia are currently pending.  I have ordered Diflucan for treatment of her yeast infection.  She will be discharged on Valtrex.  Close follow-up with PCP, discussed with her precautions  Final Clinical Impressions(s) / ED Diagnoses   Final diagnoses:  Herpes zoster without complication  Yeast vaginitis    ED Discharge Orders    None       Arthor CaptainHarris, Collins Dimaria, PA-C 12/31/18 1748    Arby BarrettePfeiffer, Marcy, MD 01/03/19 [phone]

## 2018-12-31 NOTE — Discharge Instructions (Addendum)
You were treated today for yeast vaginitis with Diflucan.  If you continue to have symptoms I would encourage you to use an over-the-counter intravaginal cream treatment like Monistat which has broader coverage for various types of yeast.  You have an HIV and syphilis test as well as a gonorrhea and chlamydia test which are pending and take about 2 days to return.  You may check your MyChart or you will be contacted directly if there is an abnormality. I have prescribed a narcotic pain medicine as shingles can be extremely painful.  You will need to take the antiviral medication Valtrex 3 times a day for the next week to reduce the severity of your symptoms.  This is very important. Return to the emergency department if you have any new or worsening symptoms including fevers, chills, uncontrolled pain, new symptoms of pain, redness and swelling over the rash.

## 2019-01-01 LAB — GC/CHLAMYDIA PROBE AMP (~~LOC~~) NOT AT ARMC
Chlamydia: NEGATIVE
Neisseria Gonorrhea: NEGATIVE

## 2019-01-02 ENCOUNTER — Telehealth: Payer: Self-pay | Admitting: *Deleted

## 2019-01-02 NOTE — Telephone Encounter (Signed)
Thank you :)

## 2019-01-02 NOTE — Telephone Encounter (Signed)
Information sent to DIS to contact and connect to care.

## 2019-03-09 ENCOUNTER — Emergency Department (HOSPITAL_COMMUNITY)
Admission: EM | Admit: 2019-03-09 | Discharge: 2019-03-09 | Disposition: A | Discharge status: Home / Self Care | Payer: Medicaid Other | Attending: Emergency Medicine | Admitting: Emergency Medicine

## 2019-03-09 ENCOUNTER — Encounter (HOSPITAL_COMMUNITY): Payer: Self-pay | Admitting: Emergency Medicine

## 2019-03-09 DIAGNOSIS — K029 Dental caries, unspecified: Secondary | ICD-10-CM | POA: Diagnosis not present

## 2019-03-09 DIAGNOSIS — Z8709 Personal history of other diseases of the respiratory system: Secondary | ICD-10-CM | POA: Insufficient documentation

## 2019-03-09 DIAGNOSIS — E039 Hypothyroidism, unspecified: Secondary | ICD-10-CM | POA: Diagnosis not present

## 2019-03-09 DIAGNOSIS — B20 Human immunodeficiency virus [HIV] disease: Secondary | ICD-10-CM | POA: Insufficient documentation

## 2019-03-09 DIAGNOSIS — Z79899 Other long term (current) drug therapy: Secondary | ICD-10-CM | POA: Diagnosis not present

## 2019-03-09 DIAGNOSIS — R220 Localized swelling, mass and lump, head: Secondary | ICD-10-CM | POA: Diagnosis present

## 2019-03-09 DIAGNOSIS — K047 Periapical abscess without sinus: Secondary | ICD-10-CM | POA: Diagnosis not present

## 2019-03-09 HISTORY — DX: Asymptomatic human immunodeficiency virus (hiv) infection status: Z21

## 2019-03-09 HISTORY — DX: Human immunodeficiency virus (HIV) disease: B20

## 2019-03-09 LAB — POC URINE PREG, ED: Preg Test, Ur: NEGATIVE

## 2019-03-09 MED ORDER — AMOXICILLIN-POT CLAVULANATE 875-125 MG PO TABS: 1.0000 | ORAL_TABLET | Freq: Two times a day (BID) | ORAL | 0 refills | Status: DC

## 2019-03-09 MED ORDER — HYDROCODONE-ACETAMINOPHEN 5-325 MG PO TABS: 2.0000 | ORAL_TABLET | ORAL | 0 refills | Status: DC | PRN

## 2019-03-09 NOTE — Discharge Instructions (Addendum)
Psychiatry Resource List (Adults and Children) Most of these providers will take Medicaid. please consult your insurance for a complete and updated list of available providers. When calling to make an appointment have your insurance information available to confirm you are covered.   [place]:   [place]: [address]     [phone] [place] Hoff: [place],        [phone] [place]: [address] [place],    [place]: [address] [place],                   [place]  (Psychiatry & counseling ; adults & children ; will take Medicaid) [place], [place], [place]        [phone]     [place]  (Psychiatry only; Adults only, will take Medicaid)  [place], [place], [place] [address]       [phone]     [place] (Psychiatry & counseling ; adults & children ; will take Medicaid [address]  [place] [address]   [phone]    ([place] therapist)   [facility]  Psychiatry & counseling; Adults and children;  [address]    [place], [place] [address]    [phone]   [facility] (Psychiatry & counseling; adults & children; Medicare no Medicaid)  [place], [place]  [address]      [phone]     [facility] (up to age 73)  Psychiatry & counseling ,will take Medicaid, must do counseling to receive psychiatry services  [address] [place] [address]        ([place] (Psychiatry & counseling; adults & children; will take Medicaid) [address],  [place], [place]  [phone]   [facility]---  Walk-in Mon-Fri, 8:30-5:00 (will take Medicaid)  [address], [place], [place]  [phone]      [facility] --- Walk-In Mon-Friday 8am-3pm ( will take Medicaid, Psychiatry, Adults &  children,  [address], [place], [place]   [phone]     [facility] [place]--, Walk-in M-F 8am-12pm and 1pm -3pm   (Counseling, Psychiatry, will take Medicaid, adults & children)  [address] [place], [place]  [phone]     [place] Transportation Resources:  [place]: reduced-fare bus ID available with unexpired Medicaid/Medicare or the orange card/ call [phone] to confirm hours to obtain ID card Medicaid Transportation Medical appointment: Call [phone] to apply SCAT Paratransit services: Eligible riders only, call [phone] for an application Non- Medicaid Transportation ([place]) Call for more information [phone] Medicare Transportation- contact your provider to see if they offer transportation to medical appointments. The phone number is listed on the back of your card. Therapist, sports Age 31 & older [phone] (HP) [phone] (GSB)  [place] for non-medical rides,  Age 30 or older  [phone] [place] only --Dial a Ride for medical and non-medical,  Age 29 or older   [place]:   [place]: [employer]:   www.gha-[place].org/  [phone] [place], [place], [place] [address]    [place] Housing Medical sales representative.NCHousingSearch.org  [phone]   [facility] Housing Authority:  [phone]     [address] [place], [place], [place] [address]    [facility] Housing Authority:  [phone]     [address] Fransisca Kaufmann [place], [place] [address]     Affordable Housing Management:  [phone]  http://[email or web address].[email or web address]  [address] [place], [place] [address]   [employer]: [facility] Emergency Shelter By appointment only - Call [phone]    Homeless: [employer] [facility])  [phone]   [address] [employer]   Housing Publishing rights manager Housing Solutions (Home repair program)  www.http://ewing.com/  [address]  Gwyndolyn Kaufman [place], [place] [address]   [phone]  (For homeowners only)   Possible Utility Resources                                                                            [employer] Immunologist.WRLP.net   ([phone]  e-mail info@wrlp .net   [facility], [address] [place] [phone] or [phone] (some housing help available, priority goes to [employer] shelter residents with an income) [facility] Department of Social Services (assistance available as funding permits) [address] [place] or [address] [facility] [phone]  HU DJSHFWY OV ZCHY Society [address] [phone]   (up to $50 only) [employer] [phone] 8:30-5:00 [facility] [phone] Venita Lick, Th 10:00am-2:00pm [facility] [phone], extension 227  T, Th 10:00am-1:00pm [facility] [place]), [phone] [facility]), [address] [phone] [facility] Family Justice Center:  [phone] [address], [employer] (2nd floor) Walk-in hours M-F 8:30 to 4:30    www.my[place].com/family-justice-center Victims of domestic violence, child abuse, sexual assault, & elder abuse   Family Service of the [place]    24-hr Crisis line:  606-564-7386 (GSB)  &  [phone] (HP) [address] [place]     Domestic violence, rape and victim assistance         [employer] [facility]- Trauma Recovery Program  www.kellinfoundation.org  [address] Leonard Schwartz  [place], [place] [address]     [phone]     Family violence, domestic violence, traumatic or stressful life events   Women's Resource Center:  [address], [place], [place]  Call [phone]  (food and clothing referrals,  legal advice, etc.).    Food Resources SNAP/ Food benefits: [facility] DSS [phone] [address] [place] ;     [address] [place], [place] [facility]) Nutrition program [facility] [phone];     [place] [phone]    [facility]   Free Produce & Food  (Every Thursday 9AM to 11:00AM) [employer] Seventh Day [employer]      [phone] [address] [employer] [facility] Assistance:  [address] [place], [place]   [phone] [name] [place]  [phone]   Legal Aid Helpline Toll-free: 506-266-2954 331-092-3589) Senior Legal Helpline : 909-229-1041 Hours: Monday - Friday: 9-11 a.m. & 1-3 p.m. Free legal help for [employer] 60 years of  age or older. No income limitations apply. [place] Navigator Helpline : (251)396-42911-435-680-7781 Hours: Monday - Friday: 10 a.m. - 4 p.m.  Free help with health insurance on the [employer] Insurance Marketplace. Fair Housing Helpline : 74053700341-855-797-FAIR (825)040-5179(3247) : Free help with housing discrimination. Battered Immigrant Helpline : (919)422-80101-936-781-9647 Hours: Thursdays: 9 a.m. - 4 p.m. Free help for immigrants who are victims of domestic violence.   [place] 211: Confidential 24/7/365  with Multilingual for all community resources. Three ways to obtain information: Dial 211 [phone]  Search online: [email or web address]

## 2019-03-09 NOTE — ED Triage Notes (Signed)
R sided jaw swelling started several days ago. Also requesting preg test.

## 2019-03-09 NOTE — ED Provider Notes (Addendum)
[facility] EMERGENCY DEPARTMENT Provider Note   CSN: [id number] Arrival date & time: 03/09/19  1111     History Chief Complaint  Patient presents with  . Oral Swelling    Kristin Hart is a 38 y.o. female.  HPI Patient is a 38 year old female with history of dental caries and recently diagnosed with HIV presented today with right jaw pain and swelling.  Patient states that she has had several weeks of pain that progressively worsened over the past 3 days.  Patient states it hurts to chew and when she touches her teeth.  States that she has had numerous teeth pulled in the past due to dental caries and states that she has not seen a dentist in a while.  Patient has a history of methamphetamine use states that she is not currently using.   Discussions of patient's past medical history patient states she has no known past medical history other than mental health problems.  Mitral review patient has had positive HIV 2 months ago.  Patient states she is not aware and is shocked to discover this.      Past Medical History:  Diagnosis Date  . Asthma   . 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 Diabetes mellitus without complication (HCC)    type 2  . Foreign body in small intestine   . HIV (human immunodeficiency virus infection) (HCC)   . Hypothyroidism   . Stomach ulcer     Patient Active Problem List   Diagnosis Date Noted  . HIV (human immunodeficiency virus infection) (HCC) 12/31/2018  . Anxiety 05/29/2017  . Self-mutilation 05/29/2017  . Depression 06/07/2016  . Marijuana use 06/07/2016  . Low vitamin D level 03/15/2016  . Prediabetes 02/22/2016  . Asthma 02/22/2016  . ADD (attention deficit disorder) 02/22/2016  . 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 Hyperlipidemia with target LDL less than 70 01/31/2012  . Migraine 01/31/2012  . Tremor, 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    Past Surgical History:    Procedure Laterality Date  . CESAREAN SECTION N/A 07/04/2016   Procedure: CESAREAN SECTION;  Surgeon: [name], DO;  Location: [facility] BIRTHING SUITES;  Service: Obstetrics;  Laterality: N/A;  . ENTEROSCOPY N/A 11/08/2017   Procedure: ENTEROSCOPY;  Surgeon: Hilarie Fredrickson, MD;  Location: [facility] ENDOSCOPY;  Service: Endoscopy;  Laterality: N/A;  . NO PAST SURGERIES       OB History    Gravida  1   Para  1   Term  1   Preterm      AB      Living        SAB      TAB      Ectopic      Multiple  0   Live Births              No family history on file.  Social History   Tobacco Use  . Smoking status: Never Smoker  . Smokeless tobacco: Never Used  Substance Use Topics  . Alcohol use: No  . Drug use: Yes    Types: Methamphetamines, Marijuana    Comment: last used in Aug/Sept??    Home Medications Prior to Admission medications   Medication Sig Start Date End Date Taking? Authorizing Provider  albuterol (PROAIR HFA) 108 (90 Base) MCG/ACT inhaler Inhale 1 puff into the lungs every 4 (four) hours as needed.  06/11/16   [provider]  diphenhydrAMINE (  BENADRYL) 25 MG tablet Take 1 tablet (25 mg total) by mouth every 6 (six) hours as needed. 08/17/18   Henderly, Britni A, PA-C  fluticasone (FLOVENT HFA) 44 MCG/ACT inhaler Inhale 2 puffs into the lungs 2 (two) times daily. 11/14/18   Ellwood Denseumball, Alison, DO  HYDROcodone-acetaminophen (NORCO) 5-325 MG tablet Take 1 tablet by mouth every 6 (six) hours as needed for severe pain. 12/31/18   Arthor CaptainHarris, Abigail, PA-C  hydrocortisone cream 1 % Apply to affected area 2 times daily 08/17/18   Henderly, Britni A, PA-C  ibuprofen (ADVIL,MOTRIN) 600 MG tablet Take 1 tablet (600 mg total) by mouth every 6 (six) hours as needed. Patient taking differently: Take 800 mg by mouth every 6 (six) hours as needed.  07/07/16   Arabella MerlesShaw, Kimberly D, CNM  mupirocin ointment (BACTROBAN) 2 % APPLY SPARINGLY TO AFFECTED AREA 3 TIMES A DAY FOR 10 DAYS 01/11/18    [provider]  naproxen (NAPROSYN) 375 MG tablet Take 1 tablet (375 mg total) by mouth 2 (two) times daily. 05/16/17   Janne NapoleonNeese, Hope M, NP  valACYclovir (VALTREX) 1000 MG tablet Take 1 tablet (1,000 mg total) by mouth 3 (three) times daily. 12/31/18   Arthor CaptainHarris, Abigail, PA-C    Allergies    Patient has no known allergies.  Review of Systems   Review of Systems  Constitutional: Negative for chills and fever.  HENT: Positive for dental problem. Negative for congestion.   Respiratory: Negative for cough.   Cardiovascular: Negative for chest pain.  Gastrointestinal: Negative for abdominal pain and vomiting.  Genitourinary: Negative for dysuria.  Musculoskeletal: Negative for myalgias.  Skin: Negative for rash.  Neurological: Negative for dizziness and headaches.    Physical Exam Updated Vital Signs BP (!) 150/77   Pulse 96   Temp 98.1 F (36.7 C) (Oral)   Resp 16   LMP  (LMP Unknown)   SpO2 100%   Physical Exam Vitals and nursing note reviewed.  Constitutional:      General: She is not in acute distress.    Appearance: Normal appearance. She is not ill-appearing.  HENT:     Head: Normocephalic and atraumatic.      Nose: Nose normal. No congestion or rhinorrhea.     Mouth/Throat:     Mouth: Mucous membranes are moist.      Comments: Extremely poor dentition with numerous extracted teeth and multiple dental caries.  Eyes:     General: No scleral icterus.       Right eye: No discharge.        Left eye: No discharge.     Conjunctiva/sclera: Conjunctivae normal.  Pulmonary:     Effort: Pulmonary effort is normal.     Breath sounds: No stridor.  Neurological:     Mental Status: She is alert and oriented to person, place, and time. Mental status is at baseline.     ED Results / Procedures / Treatments   Labs (all labs ordered are listed, but only abnormal results are displayed) Labs Reviewed  POC URINE PREG, ED    EKG None  Radiology No results  found.  Procedures Procedures (including critical care time)  Medications Ordered in ED Medications - No data to display  ED Course  I have reviewed the triage vital signs and the nursing notes.  Pertinent labs & imaging results that were available during my care of the patient were reviewed by me and considered in my medical decision making (see chart for details).  MDM Rules/Calculators/A&P                      Patient is a 38 year old female with history of dental caries and recently diagnosed with HIV presented today with right jaw pain and swelling.  Physical exam significant for pain to percussion of right lower teeth.  There is no evidence of abscess on exam.  There is swelling of her right lower jaw.  Patient has no abdominal tenderness to palpation tongue or buccal mucosa.  Doubt Ludwig's angina.  Doubt deep space infection.  She is well-appearing.  However she was unaware that she was HIV positive.  I discussed this with her she is understandably upset.  Recommended infectious disease follow-up.  Consult infectious disease placed in emergency department however patient preferred to be discharged now and would not like to wait and have blood work done such as CBC, BMP, viral load.  I think is reasonable at this time she appears well.  Patient states she will follow up with infectious disease and she will follow-up with her primary care doctor this week.  Patient states she will also follow-up with dentist as soon as she is able to.  Patient is afebrile vitals within normal limits nontachycardic.  Doubt that she has serious infection at this time.  Given return precautions she is understanding of these.    This patient appears reasonably screened and I doubt any other medical condition requiring further workup, evaluation, or treatment in the ED at this time prior to discharge.   Patient's vitals are WNL apart from vital sign abnormalities discussed above, patient is in  NAD, and able to ambulate in the ED at their baseline. Pain has been managed or a plan has been made for home management and has no complaints prior to discharge. Patient is comfortable with above plan and is stable for discharge at this time. All questions were answered prior to disposition. Results from the ER workup discussed with the patient face to face and all questions answered to the best of my ability. The patient is safe for discharge with strict return precautions. Patient appears safe for discharge with appropriate follow-up. Conveyed my impression with the patient and they voiced understanding and are agreeable to plan.   An After Visit Summary was printed and given to the patient.  Portions of this note were generated with Lobbyist. Dictation errors may occur despite best attempts at proofreading.   PDMP reviewed patient does have history of drug use however she is in pain will discharge with 6 pills of Norco and extensive counseling given on use.  Patient will use ibuprofen and Norco only for breakthrough pain.  Final Clinical Impression(s) / ED Diagnoses Final diagnoses:  Dental infection    Rx / DC Orders ED Discharge Orders    None       Tedd Sias, [place] 03/09/19 [place], Matteus Mcnelly [place], [place] 03/09/19 1324    Pattricia Boss, MD 03/10/19 1356

## 2019-03-09 NOTE — ED Notes (Signed)
ED Provider at bedside. 

## 2019-05-28 DIAGNOSIS — R030 Elevated blood-pressure reading, without diagnosis of hypertension: Secondary | ICD-10-CM | POA: Diagnosis not present

## 2019-05-28 DIAGNOSIS — L0390 Cellulitis, unspecified: Secondary | ICD-10-CM | POA: Diagnosis not present

## 2019-06-08 ENCOUNTER — Encounter (HOSPITAL_COMMUNITY): Payer: Self-pay | Admitting: *Deleted

## 2019-06-08 ENCOUNTER — Other Ambulatory Visit: Payer: Self-pay

## 2019-06-08 ENCOUNTER — Emergency Department (HOSPITAL_COMMUNITY)
Admission: EM | Admit: 2019-06-08 | Discharge: 2019-06-08 | Disposition: A | Discharge status: Home / Self Care | Payer: Medicaid Other | Attending: Emergency Medicine | Admitting: Emergency Medicine

## 2019-06-08 DIAGNOSIS — Z7984 Long term (current) use of oral hypoglycemic drugs: Secondary | ICD-10-CM | POA: Diagnosis not present

## 2019-06-08 DIAGNOSIS — L089 Local infection of the skin and subcutaneous tissue, unspecified: Secondary | ICD-10-CM | POA: Diagnosis not present

## 2019-06-08 DIAGNOSIS — E119 Type 2 diabetes mellitus without complications: Secondary | ICD-10-CM | POA: Diagnosis not present

## 2019-06-08 DIAGNOSIS — Z114 Encounter for screening for human immunodeficiency virus [HIV]: Secondary | ICD-10-CM | POA: Diagnosis not present

## 2019-06-08 LAB — HIV ANTIBODY (ROUTINE TESTING W REFLEX): HIV Screen 4th Generation wRfx: REACTIVE — AB

## 2019-06-08 MED ORDER — HYDROXYZINE HCL 25 MG PO TABS: 25.0000 mg | ORAL_TABLET | Freq: Four times a day (QID) | ORAL | 0 refills | Status: AC

## 2019-06-08 NOTE — ED Provider Notes (Signed)
[facility] EMERGENCY DEPARTMENT Provider Note   CSN: [id number] Arrival date & time: 06/08/19  1607     History Chief Complaint  Patient presents with  . Recurrent Skin Infections    Kristin Hart is a 39 y.o. female with PMHx asthma, borderline personality disorder, diabetes who presents to the ED today for recurrent skin infections.  She reports that she has had sores on her skin has picked at them ever since she was a small child.  She reports that recently her son who is 20 years old noticed a spot of some sore on one of his fingers and was diagnosed with MRSA at his pediatrician's office.  Pt's mother-in-law is requesting that she be tested for MRSA in order to see her child -is concerned that patient could have passed it onto him.  [facility] her current skin infections and was placed on doxycycline as well as instructed to apply bacitracin to the areas.  Patient has about 2 days left of the doxycycline and has been compliant.  She states that her sores have been clearing up.  Denies fevers or chills.  Mention that prior to being started on antibiotics the sores were draining what appeared to be green purulent material.   Patient is also requesting to be tested for HIV.  She reports that she found out approximately 1 month ago that she had a positive test.  She states that she was unaware of this.  Per chart review patient was seen on 12/31/2022 shingles.  She was tested for HIV at that time which returned positive.  Patient does not have a up-to-date cell phone number in the system and unfortunately was never contacted with her results.  She states that she was at another visit about 1 month ago and someone had mentioned her HIV positive status to her.  Patient states she wants a second opinion on her test.   The history is provided by the patient and medical records.       Past Medical History:  Diagnosis Date  . Asthma    . 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 Diabetes mellitus without complication (HCC)    type 2  . Foreign body in small intestine   . HIV (human immunodeficiency virus infection) (HCC)   . Hypothyroidism   . Stomach ulcer     Patient Active Problem List   Diagnosis Date Noted  . HIV (human immunodeficiency virus infection) (HCC) 12/31/2018  . Anxiety 05/29/2017  . Self-mutilation 05/29/2017  . Depression 06/07/2016  . Marijuana use 06/07/2016  . Low vitamin D level 03/15/2016  . Prediabetes 02/22/2016  . Asthma 02/22/2016  . ADD (attention deficit disorder) 02/22/2016  . 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 Hyperlipidemia with target LDL less than 70 01/31/2012  . Migraine 01/31/2012  . Tremor, 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    Past Surgical History:  Procedure Laterality Date  . CESAREAN SECTION N/A 07/04/2016   Procedure: CESAREAN SECTION;  Surgeon: [name], DO;  Location: [facility] BIRTHING SUITES;  Service: Obstetrics;  Laterality: N/A;  . ENTEROSCOPY N/A 11/08/2017   Procedure: ENTEROSCOPY;  Surgeon: Hilarie Fredrickson, MD;  Location: [facility] ENDOSCOPY;  Service: Endoscopy;  Laterality: N/A;  . NO PAST SURGERIES       OB History    Gravida  1   Para  1   Term  1   Preterm  AB      Living        SAB      TAB      Ectopic      Multiple  0   Live Births              No family history on file.  Social History   Tobacco Use  . Smoking status: Never Smoker  . Smokeless tobacco: Never Used  Substance Use Topics  . Alcohol use: No  . Drug use: Yes    Types: Methamphetamines, Marijuana    Comment: last used in Aug/Sept??    Home Medications Prior to Admission medications   Medication Sig Start Date End Date Taking? Authorizing Provider  albuterol (PROAIR HFA) 108 (90 Base) MCG/ACT inhaler Inhale 1 puff into the lungs every 4 (four) hours as needed.  06/11/16   [provider]    amoxicillin-clavulanate (AUGMENTIN) 875-125 MG tablet Take 1 tablet by mouth every 12 (twelve) hours. 03/09/19   Gailen Shelter, PA  diphenhydrAMINE (BENADRYL) 25 MG tablet Take 1 tablet (25 mg total) by mouth every 6 (six) hours as needed. 08/17/18   Henderly, Britni A, PA-C  fluticasone (FLOVENT HFA) 44 MCG/ACT inhaler Inhale 2 puffs into the lungs 2 (two) times daily. 11/14/18   Ellwood Dense, DO  HYDROcodone-acetaminophen (NORCO/VICODIN) 5-325 MG tablet Take 2 tablets by mouth every 4 (four) hours as needed. 03/09/19   Gailen Shelter, PA  hydrocortisone cream 1 % Apply to affected area 2 times daily 08/17/18   Henderly, Britni A, PA-C  hydrOXYzine (ATARAX/VISTARIL) 25 MG tablet Take 1 tablet (25 mg total) by mouth every 6 (six) hours for 15 days. 06/08/19 06/23/19  Tanda Rockers, PA-C  ibuprofen (ADVIL,MOTRIN) 600 MG tablet Take 1 tablet (600 mg total) by mouth every 6 (six) hours as needed. Patient taking differently: Take 800 mg by mouth every 6 (six) hours as needed.  07/07/16   Arabella Merles, CNM  mupirocin ointment (BACTROBAN) 2 % APPLY SPARINGLY TO AFFECTED AREA 3 TIMES A DAY FOR 10 DAYS 01/11/18   [provider]  naproxen (NAPROSYN) 375 MG tablet Take 1 tablet (375 mg total) by mouth 2 (two) times daily. 05/16/17   Janne Napoleon, NP  valACYclovir (VALTREX) 1000 MG tablet Take 1 tablet (1,000 mg total) by mouth 3 (three) times daily. 12/31/18   Arthor Captain, PA-C    Allergies    Patient has no known allergies.  Review of Systems   Review of Systems  Constitutional: Negative for chills and fever.  Skin: Positive for wound.    Physical Exam Updated Vital Signs BP 135/83 (BP Location: Left Arm)   Pulse (!) 104   Temp 98.9 F (37.2 C) (Oral)   Resp 14   SpO2 99%   Physical Exam Vitals and nursing note reviewed.  Constitutional:      Appearance: She is not ill-appearing or diaphoretic.  HENT:     Head: Normocephalic and atraumatic.  Eyes:      Conjunctiva/sclera: Conjunctivae normal.  Cardiovascular:     Rate and Rhythm: Normal rate and regular rhythm.     Pulses: Normal pulses.  Pulmonary:     Effort: Pulmonary effort is normal.     Breath sounds: Normal breath sounds. No wheezing, rhonchi or rales.  Skin:    General: Skin is warm and dry.     Coloration: Skin is not jaundiced.     Comments: Multiple open sores diffusely to patient's body  with excoriations. No obvious drainage. Sore on left anterior thigh with small amount of clear drainage.   Neurological:     Mental Status: She is alert.     ED Results / Procedures / Treatments   Labs (all labs ordered are listed, but only abnormal results are displayed) Labs Reviewed  AEROBIC CULTURE (SUPERFICIAL SPECIMEN)  HIV ANTIBODY (ROUTINE TESTING W REFLEX)    EKG None  Radiology No results found.  Procedures Procedures (including critical care time)  Medications Ordered in ED Medications - No data to display  ED Course  I have reviewed the triage vital signs and the nursing notes.  Pertinent labs & imaging results that were available during my care of the patient were reviewed by me and considered in my medical decision making (see chart for details).    MDM Rules/Calculators/A&P                      39 year old female who presents to the ED today complaining of recurrent skin infections.  Currently requesting to be tested for MRSA as her son recently tested positive for this after he had a sore on his finger.  She is also request to be tested for HIV.  Per chart review patient has positive HIV test approximately 5 months ago in October 2020.  I had lengthy discussion with patient regarding the fact that that test was positive and is accurate.  Patient is an understanding but would still like an additional test despite the recurring cost.  She never followed up with infectious disease as she was never contacted due to this result.  I will provide information for  infectious disease provider for further follow-up.  In regards to patient's recurrent skin infections, she is currently on doxycycline and has approximately 2 days left.  Has also been applying bacitracin.  She has one sore on her anterior left thigh that appears to be draining a small amount of clear fluid.  Will obtain superficial wound culture at this time.  Patient is on the appropriate antibiotic and she is encouraged to continue doing this.  She does endorse that she picks at her skin due to anxiety as well as itching.  I will prescribe hydroxyzine for her.   To be discharged at this time.  She has been given a prescription for hydroxyzine for itching.  She is on the appropriate antibiotic to cover for potential MRSA.  Advised that she can check her MyChart results in 2 days time if she does not have a cell phone in no way of contacting her via phone for her HIV status.  She will follow up with infectious disease to discuss starting antivirals.   This note was prepared using Dragon voice recognition software and may include unintentional dictation errors due to the inherent limitations of voice recognition software.  Final Clinical Impression(s) / ED Diagnoses Final diagnoses:  Recurrent infection of skin    Rx / DC Orders ED Discharge Orders         Ordered    hydrOXYzine (ATARAX/VISTARIL) 25 MG tablet  Every 6 hours     06/08/19 1928           Discharge Instructions     Please continue taking your antibiotic as prescribed. You are on the appropriate antibiotic to cover for potential MRSA. We have sent a wound culture and will contact you if it grows any type of bacteria.   We have also test you for HIV per  your request. You have a positive HIV test in our system from October 2020 - please call Dr. Cliffton Asters with Infectious Disease to schedule an appointment to be seen to discuss starting antiviral HIV medications.   Please pick up the medication and take as prescribed. It will  help with itching and feelings of nervousness.   Return to the ED for any worsening symptoms.       Tanda Rockers, PA-C 06/08/19 1930    Milagros Loll, MD 06/09/19 [phone]

## 2019-06-08 NOTE — Discharge Instructions (Addendum)
Please continue taking your antibiotic as prescribed. You are on the appropriate antibiotic to cover for potential MRSA. We have sent a wound culture and will contact you if it grows any type of bacteria.   We have also test you for HIV per your request. You have a positive HIV test in our system from October 2020 - please call Dr. Cliffton Asters with Infectious Disease to schedule an appointment to be seen to discuss starting antiviral HIV medications.   Please pick up the medication and take as prescribed. It will help with itching and feelings of nervousness.   Return to the ED for any worsening symptoms.

## 2019-06-08 NOTE — ED Notes (Signed)
Pt tells me that the grandmother of her child is requiring that she be tested for MRSA in order to see her child. Pt is tearful as PA speaks with her regarding her HIV test having been positive 5 months ago.

## 2019-06-08 NOTE — ED Triage Notes (Signed)
Pt states that her son has been dx with MRSA (spot on his finger) and she would like to be evaluated to see if she has this, she is very emotional about this.  Pt does have some sores on her body and states that she "picks at skin since first grade" and while none of them are infected at this time but she has had to take antibiotics in the past.  Pt also states that she was exposed to HIV and would like to be checked.

## 2019-06-10 ENCOUNTER — Telehealth: Payer: Self-pay | Admitting: *Deleted

## 2019-06-10 LAB — HIV-1/2 AB - DIFFERENTIATION
HIV 1 Ab: POSITIVE — AB
HIV 2 Ab: NEGATIVE

## 2019-06-10 LAB — AEROBIC CULTURE W GRAM STAIN (SUPERFICIAL SPECIMEN)
Culture: NORMAL
Gram Stain: NONE SEEN

## 2019-06-10 NOTE — Telephone Encounter (Signed)
Spoke with [place] at [facility]. Faxed over referral, let her know there is not a good phone number to reach [place]. Andree Coss, RN

## 2019-06-10 NOTE — Telephone Encounter (Signed)
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 Can we refer to [facility] and attempt to get her into care? Thanks!

## 2019-06-23 ENCOUNTER — Ambulatory Visit: Payer: Medicaid Other | Admitting: *Deleted

## 2019-06-23 ENCOUNTER — Other Ambulatory Visit (HOSPITAL_COMMUNITY)
Admission: RE | Admit: 2019-06-23 | Discharge: 2019-06-23 | Disposition: A | Discharge status: Home / Self Care | Payer: Medicaid Other | Source: Ambulatory Visit | Attending: Infectious Diseases | Admitting: Infectious Diseases

## 2019-06-23 ENCOUNTER — Other Ambulatory Visit: Payer: Self-pay

## 2019-06-23 ENCOUNTER — Other Ambulatory Visit: Payer: Medicaid Other

## 2019-06-23 ENCOUNTER — Ambulatory Visit: Payer: Medicaid Other

## 2019-06-23 ENCOUNTER — Other Ambulatory Visit: Payer: Self-pay | Admitting: *Deleted

## 2019-06-23 DIAGNOSIS — B20 Human immunodeficiency virus [HIV] disease: Secondary | ICD-10-CM

## 2019-06-23 DIAGNOSIS — Z79899 Other long term (current) drug therapy: Secondary | ICD-10-CM | POA: Diagnosis not present

## 2019-06-23 DIAGNOSIS — Z113 Encounter for screening for infections with a predominantly sexual mode of transmission: Secondary | ICD-10-CM

## 2019-06-23 LAB — URINALYSIS
Bilirubin Urine: NEGATIVE
Glucose, UA: NEGATIVE
Hgb urine dipstick: NEGATIVE
Ketones, ur: NEGATIVE
Leukocytes,Ua: NEGATIVE
Nitrite: NEGATIVE
Protein, ur: NEGATIVE
Specific Gravity, Urine: 1.023 (ref 1.001–1.03)
pH: 6 (ref 5.0–8.0)

## 2019-06-23 NOTE — Progress Notes (Signed)
RN met with Kristin Hart before her new Intake appointments. She brought her best friend Kristin Hart with her. Patient tearful, worried and upset at her diagnosis.  RN introduced the clinic, discussed HIV transmission/safety at home, community support for and resources patient. Patient felt more comfortable with her diagnosis and plan after discussion. RN introduced Kristin Hart to [facility], [facility] counselor, for assessment for services. At the end of her lab visit, Kristin Hart became lightheaded. RN provided support, water, let her rest in a room for 15 minutes until she felt she was back to normal. She left with her friend Kristin Hart, who is driving. Landis Gandy, RN

## 2019-06-24 LAB — URINE CYTOLOGY ANCILLARY ONLY
Chlamydia: NEGATIVE
Comment: NEGATIVE
Comment: NORMAL
Neisseria Gonorrhea: NEGATIVE

## 2019-06-24 LAB — T-HELPER CELL (CD4) - (RCID CLINIC ONLY)
CD4 % Helper T Cell: 41 % (ref 33–65)
CD4 T Cell Abs: 577 /uL (ref 400–1790)

## 2019-07-01 LAB — LIPID PANEL
Cholesterol: 172 mg/dL (ref <200)
HDL: 54 mg/dL (ref >=50)
LDL Cholesterol (Calc): 95 mg/dL (calc)
Non-HDL Cholesterol (Calc): 118 mg/dL (calc) (ref <130)
Total CHOL/HDL Ratio: 3.2 (calc) (ref <5.0)
Triglycerides: 125 mg/dL (ref <150)

## 2019-07-01 LAB — COMPLETE METABOLIC PANEL WITH GFR
AG Ratio: 1.2 (calc) (ref 1.0–2.5)
ALT: 13 U/L (ref 6–29)
AST: 19 U/L (ref 10–30)
Albumin: 4.1 g/dL (ref 3.6–5.1)
Alkaline phosphatase (APISO): 88 U/L (ref 31–125)
BUN: 18 mg/dL (ref 7–25)
CO2: 29 mmol/L (ref 20–32)
Calcium: 9.2 mg/dL (ref 8.6–10.2)
Chloride: 98 mmol/L (ref 98–110)
Creat: 0.79 mg/dL (ref 0.50–1.10)
GFR, Est African American: 111 mL/min/{1.73_m2} (ref >=60)
GFR, Est Non African American: 96 mL/min/{1.73_m2} (ref >=60)
Globulin: 3.3 g/dL (calc) (ref 1.9–3.7)
Glucose, Bld: 238 mg/dL — ABNORMAL HIGH (ref 65–99)
Potassium: 3.6 mmol/L (ref 3.5–5.3)
Sodium: 133 mmol/L — ABNORMAL LOW (ref 135–146)
Total Bilirubin: 0.4 mg/dL (ref 0.2–1.2)
Total Protein: 7.4 g/dL (ref 6.1–8.1)

## 2019-07-01 LAB — CBC WITH DIFFERENTIAL/PLATELET
Absolute Monocytes: 243 cells/uL (ref 200–950)
Basophils Absolute: 22 cells/uL (ref 0–200)
Basophils Relative: 0.4 %
Eosinophils Absolute: 119 cells/uL (ref 15–500)
Eosinophils Relative: 2.2 %
HCT: 38.1 % (ref 35.0–45.0)
Hemoglobin: 11.9 g/dL (ref 11.7–15.5)
Lymphs Abs: 1534 cells/uL (ref 850–3900)
MCH: 25.8 pg — ABNORMAL LOW (ref 27.0–33.0)
MCHC: 31.2 g/dL — ABNORMAL LOW (ref 32.0–36.0)
MCV: 82.6 fL (ref 80.0–100.0)
MPV: 10.5 fL (ref 7.5–12.5)
Monocytes Relative: 4.5 %
Neutro Abs: 3483 cells/uL (ref 1500–7800)
Neutrophils Relative %: 64.5 %
Platelets: 243 10*3/uL (ref 140–400)
RBC: 4.61 10*6/uL (ref 3.80–5.10)
RDW: 12.7 % (ref 11.0–15.0)
Total Lymphocyte: 28.4 %
WBC: 5.4 10*3/uL (ref 3.8–10.8)

## 2019-07-01 LAB — HEPATITIS B CORE ANTIBODY, TOTAL: Hep B Core Total Ab: NONREACTIVE

## 2019-07-01 LAB — HLA B*5701: HLA-B*5701 w/rflx HLA-B High: NEGATIVE

## 2019-07-01 LAB — QUANTIFERON-TB GOLD PLUS
Mitogen-NIL: >10 IU/mL
NIL: 0.1 IU/mL
QuantiFERON-TB Gold Plus: NEGATIVE
TB1-NIL: 0 IU/mL
TB2-NIL: <0 IU/mL

## 2019-07-01 LAB — HIV-1 RNA ULTRAQUANT REFLEX TO GENTYP+
HIV 1 RNA Quant: 23300 copies/mL — ABNORMAL HIGH
HIV-1 RNA Quant, Log: 4.37 Log copies/mL — ABNORMAL HIGH

## 2019-07-01 LAB — HEPATITIS A ANTIBODY, TOTAL: Hepatitis A AB,Total: NONREACTIVE

## 2019-07-01 LAB — HEPATITIS B SURFACE ANTIGEN: Hepatitis B Surface Ag: NONREACTIVE

## 2019-07-01 LAB — HIV-1 GENOTYPE: HIV-1 Genotype: DETECTED — AB

## 2019-07-01 LAB — HEPATITIS C ANTIBODY
Hepatitis C Ab: NONREACTIVE
SIGNAL TO CUT-OFF: 0.07 (ref <1.00)

## 2019-07-01 LAB — HEPATITIS B SURFACE ANTIBODY,QUALITATIVE: Hep B S Ab: REACTIVE — AB

## 2019-07-01 LAB — RPR: RPR Ser Ql: NONREACTIVE

## 2019-07-14 ENCOUNTER — Encounter: Payer: Self-pay | Admitting: Infectious Diseases

## 2019-07-14 ENCOUNTER — Telehealth: Payer: Self-pay

## 2019-07-14 ENCOUNTER — Telehealth: Payer: Self-pay | Admitting: Pharmacy Technician

## 2019-07-14 ENCOUNTER — Ambulatory Visit: Payer: Medicaid Other | Admitting: Pharmacist

## 2019-07-14 ENCOUNTER — Encounter: Payer: Medicaid Other | Admitting: Infectious Diseases

## 2019-07-14 NOTE — Telephone Encounter (Signed)
[facility] Patient Advocate Encounter  Insurance verification completed.    The patient is insured through [place] MEDICA[employer] and has a $3 copay.    Geneieve Duell E. Maricarmen Braziel, CPhT [facility] Phone: [phone] Fax:  [phone]   

## 2019-07-14 NOTE — Telephone Encounter (Signed)
Per Judeth Cornfield, NP called patient to follow on missed new patient appt with office. Patient does not have a voicemail set up; unable to leave message. Called alternative number, female voice answered call requested he have patient call office back regarding her appt.  Did not disclose patient information.  Lorenso Courier, [place]

## 2019-07-22 ENCOUNTER — Encounter: Payer: Medicaid Other | Admitting: Infectious Diseases

## 2019-07-22 ENCOUNTER — Ambulatory Visit: Payer: Medicaid Other | Admitting: Pharmacist

## 2019-07-30 DIAGNOSIS — Z23 Encounter for immunization: Secondary | ICD-10-CM | POA: Diagnosis not present

## 2019-08-05 ENCOUNTER — Other Ambulatory Visit: Payer: Self-pay

## 2019-08-05 ENCOUNTER — Ambulatory Visit (INDEPENDENT_AMBULATORY_CARE_PROVIDER_SITE_OTHER): Payer: Medicaid Other | Admitting: Pharmacist

## 2019-08-05 ENCOUNTER — Encounter: Payer: Self-pay | Admitting: Family

## 2019-08-05 ENCOUNTER — Ambulatory Visit (INDEPENDENT_AMBULATORY_CARE_PROVIDER_SITE_OTHER): Payer: Medicaid Other | Admitting: Family

## 2019-08-05 ENCOUNTER — Telehealth: Payer: Self-pay | Admitting: Pharmacy Technician

## 2019-08-05 VITALS — BP 129/81 | HR 105 | Wt 141.0 lb

## 2019-08-05 DIAGNOSIS — B20 Human immunodeficiency virus [HIV] disease: Secondary | ICD-10-CM

## 2019-08-05 DIAGNOSIS — Z0000 Encounter for general adult medical examination without abnormal findings: Secondary | ICD-10-CM | POA: Diagnosis not present

## 2019-08-05 MED ORDER — BICTEGRAVIR-EMTRICITAB-TENOFOV 50-200-25 MG PO TABS: 1.0000 | ORAL_TABLET | Freq: Every day | ORAL | 3 refills | Status: DC

## 2019-08-05 NOTE — Progress Notes (Signed)
HPI: Kristin Hart is a 39 y.o. female who presents to the [facility] today to initiate care for a newly diagnosed HIV infection.  Patient Active Problem List   Diagnosis Date Noted  . HIV (human immunodeficiency virus infection) ([place]) 12/31/2018  . Anxiety 05/29/2017  . Self-mutilation 05/29/2017  . Depression 06/07/2016  . Marijuana use 06/07/2016  . Low vitamin D level 03/15/2016  . Prediabetes 02/22/2016  . Asthma 02/22/2016  . ADD (attention deficit disorder) 02/22/2016  . 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 Hyperlipidemia with target LDL less than 70 01/31/2012  . Migraine 01/31/2012  . Tremor, 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    Patient's Medications  New Prescriptions   No medications on file  Previous Medications   ALBUTEROL (PROAIR HFA) 108 (90 BASE) MCG/ACT INHALER    Inhale 1 puff into the lungs every 4 (four) hours as needed.    BICTEGRAVIR-EMTRICITABINE-TENOFOVIR AF (BIKTARVY) 50-200-25 MG TABS TABLET    Take 1 tablet by mouth daily.   DIPHENHYDRAMINE (BENADRYL) 25 MG TABLET    Take 1 tablet (25 mg total) by mouth every 6 (six) hours as needed.   FLUTICASONE (FLOVENT HFA) 44 MCG/ACT INHALER    Inhale 2 puffs into the lungs 2 (two) times daily.   HYDROCORTISONE CREAM 1 %    Apply to affected area 2 times daily   IBUPROFEN (ADVIL,MOTRIN) 600 MG TABLET    Take 1 tablet (600 mg total) by mouth every 6 (six) hours as needed.   MUPIROCIN OINTMENT (BACTROBAN) 2 %    APPLY SPARINGLY TO AFFECTED AREA 3 TIMES A DAY FOR 10 DAYS  Modified Medications   No medications on file  Discontinued Medications   No medications on file    Allergies: No Known Allergies  Past Medical History: Past Medical History:  Diagnosis Date  . Asthma   . Borderline personality disorder ([place])    with schizophrenic tendancies, per pt  . Diabetes mellitus without complication ([place])    type 2  . Foreign body in small intestine   . HIV (human immunodeficiency virus infection) ([place])   .  Hypothyroidism   . Stomach ulcer     Social History: Social History   Socioeconomic History  . Marital status: Single    Spouse name: Not on file  . Number of children: 1  . Years of education: Not on file  . Highest education level: Not on file  Occupational History  . Occupation: Unemployed   Tobacco Use  . Smoking status: Never Smoker  . Smokeless tobacco: Never Used  Substance and Sexual Activity  . Alcohol use: No  . Drug use: Yes    Types: Methamphetamines, Marijuana    Comment: Couple of times per month  . Sexual activity: Yes    Birth control/protection: Injection  Other Topics Concern  . Not on file  Social History Narrative  . Not on file   Social Determinants of Health   Financial Resource Strain:   . Difficulty of Paying Living Expenses:   Food Insecurity:   . Worried About Charity fundraiser in the Last Year:   . Arboriculturist in the Last Year:   Transportation Needs:   . Film/video editor (Medical):   Marland Kitchen Lack of Transportation (Non-Medical):   Physical Activity:   . Days of Exercise per Week:   . Minutes of Exercise per Session:   Stress:   . Feeling of Stress :   Social Connections:   .  Frequency of Communication with Friends and Family:   . Frequency of Social Gatherings with Friends and Family:   . Attends Religious Services:   . Active Member of Clubs or Organizations:   . Attends Banker Meetings:   Marland Kitchen Marital Status:     Labs: Lab Results  Component Value Date   HIV1RNAQUANT 23,300 (H) 06/23/2019   CD4TABS 577 06/23/2019    RPR and STI Lab Results  Component Value Date   LABRPR NON-REACTIVE 06/23/2019   LABRPR NON REACTIVE 12/31/2018   LABRPR Non Reactive 12/18/2017   LABRPR Non Reactive 09/19/2017   LABRPR Non Reactive 07/03/2016    STI Results GC CT  06/23/2019 Negative Negative  12/31/2018 Negative Negative  12/18/2017 **POSITIVE**(A) Negative  09/29/2017 Negative Negative  09/19/2017 Negative Negative    06/22/2016 Negative Negative    Hepatitis B Lab Results  Component Value Date   HEPBSAB REACTIVE (A) 06/23/2019   HEPBSAG NON-REACTIVE 06/23/2019   HEPBCAB NON-REACTIVE 06/23/2019   Hepatitis C Lab Results  Component Value Date   HEPCAB NON-REACTIVE 06/23/2019   Hepatitis A Lab Results  Component Value Date   HAV NON-REACTIVE 06/23/2019   Lipids: Lab Results  Component Value Date   CHOL 172 06/23/2019   TRIG 125 06/23/2019   HDL 54 06/23/2019   CHOLHDL 3.2 06/23/2019   LDLCALC 95 06/23/2019    Current HIV Regimen: Treatment naive  Assessment: Kristin Hart is here today to initiate care with Kristin Hart for newly diagnosed HIV infection. She is treatment naive with an initial HIV viral load of 23,300 and a CD4 count of 577. Genotype mutations found, however no drug associated resistance indicated. Hepatitis C negative and serologically immune to hepatitis B. STI testing was negative for gonorrhea, chlamydia, and syphilis. She uses marijuana and methamphetamines a couple of times per month and is currently unemployed.   Will start patient on Biktarvy 1 tablet once daily with or without food. Discussed importance of medication adherence and counseled patient on potential side-effects and that if she wishes or needs to take multivitamins in the future, she needs to separate Biktarvy 2 hours before taking them or 6 hours after. Provided patient with Kristin Hart's phone number in case she has any questions about her medication.   Plan: - Start Biktarvy 1 tablet once daily w/wo food - F/u with Kristin Hart in 1 month on 6/25  Kristin Hart, PharmD PGY1 Ambulatory Care Resident [facility] 08/05/2019, 3:55 PM

## 2019-08-05 NOTE — Patient Instructions (Addendum)
Nice to meet you.  We will get you started on Biktarvy today.  We will check your diabetes status at your next office visit.   Let us know if you have any questions.  Plan for follow up in 1 month or sooner If needed with lab work on the same day.  Have a great day!

## 2019-08-05 NOTE — Progress Notes (Signed)
Subjective:    Patient ID: Kristin Hart, female    DOB: [birth year], 39 y.o.   MRN: [id number]  Chief Complaint  Patient presents with  . HIV Positive/AIDS    HPI:  Kristin Hart is a 39 y.o. female with previous medical history of migraines, hyperlipidemia, attention deficit, depression/anxiety, and marijuana use who presents today for initial evaluation and treatment for HIV disease.   Kristin Hart was recently in the ED on 06/08/19 with concern for MRSA skin infection. She had previously tested positive for HIV on 12/31/18 and confirmed with HIV-1 Ab positive test on 3/29. Last negative HIV test was 12/18/17.   Kristin Hart completed initial clinic blood work on 06/23/2019 with initial viral load of 23,300 and CD4 count of 577.  Genotype testing with wild-type virus no significant resistance patterns.  HLA B5 701 and QuantiFERON gold negative.  Hepatitis C negative and serologically immune to hepatitis B. STI testing was negative for gonorrhea, chlamydia, and syphilis.  Kidney function, liver function, and electrolytes within normal ranges.  Blood sugar was elevated at 238.  Lipid profile with triglycerides 125, LDL 95, and HDL 54.  Kristin Hart currently has Medicaid. Denies any feelings of being down, depressed or hopeless. Uses marijuana and methamphetamines a couple of times per month. No tobacco use or alcohol consumption. She is currently living in a house that has no electricity. Unemployed and may be seeking employment. Having lots of generalized pain today and believes she may have fibromyalgia. She has a 49 year old son who is everything to her. Transportation is limited and uses the bus when she can. Sexually active and uses condoms when she is.  No Known Allergies    Outpatient Medications Prior to Visit  Medication Sig Dispense Refill  . albuterol (PROAIR HFA) 108 (90 Base) MCG/ACT inhaler Inhale 1 puff into the lungs every 4 (four) hours as needed.     . diphenhydrAMINE  (BENADRYL) 25 MG tablet Take 1 tablet (25 mg total) by mouth every 6 (six) hours as needed. 30 tablet 0  . fluticasone (FLOVENT HFA) 44 MCG/ACT inhaler Inhale 2 puffs into the lungs 2 (two) times daily. 1 Inhaler 12  . hydrocortisone cream 1 % Apply to affected area 2 times daily 15 g 0  . ibuprofen (ADVIL,MOTRIN) 600 MG tablet Take 1 tablet (600 mg total) by mouth every 6 (six) hours as needed. (Patient taking differently: Take 800 mg by mouth every 6 (six) hours as needed. ) 30 tablet 0  . mupirocin ointment (BACTROBAN) 2 % APPLY SPARINGLY TO AFFECTED AREA 3 TIMES A DAY FOR 10 DAYS  0  . amoxicillin-clavulanate (AUGMENTIN) 875-125 MG tablet Take 1 tablet by mouth every 12 (twelve) hours. 14 tablet 0  . HYDROcodone-acetaminophen (NORCO/VICODIN) 5-325 MG tablet Take 2 tablets by mouth every 4 (four) hours as needed. 6 tablet 0  . naproxen (NAPROSYN) 375 MG tablet Take 1 tablet (375 mg total) by mouth 2 (two) times daily. 20 tablet 0  . valACYclovir (VALTREX) 1000 MG tablet Take 1 tablet (1,000 mg total) by mouth 3 (three) times daily. 21 tablet 0   No facility-administered medications prior to visit.     Past Medical History:  Diagnosis Date  . Asthma   . 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 Diabetes mellitus without complication ([place])    type 2  . Foreign body in small intestine   . HIV (human immunodeficiency virus infection) ([place])   .  Hypothyroidism   . Stomach ulcer       Past Surgical History:  Procedure Laterality Date  . CESAREAN SECTION N/A 07/04/2016   Procedure: CESAREAN SECTION;  Surgeon: Truett Mainland, DO;  Location: [facility];  Service: Obstetrics;  Laterality: N/A;  . ENTEROSCOPY N/A 11/08/2017   Procedure: ENTEROSCOPY;  Surgeon: Irene Shipper, MD;  Location[facility] ENDOSCOPY;  Service: Endoscopy;  Laterality: N/A;  . NO PAST SURGERIES      History reviewed. No pertinent family history.    Social History    Socioeconomic History  . Marital status: Single    Spouse name: Not on file  . Number of children: 1  . Years of education: Not on file  . Highest education level: Not on file  Occupational History  . Occupation: Unemployed   Tobacco Use  . Smoking status: Never Smoker  . Smokeless tobacco: Never Used  Substance and Sexual Activity  . Alcohol use: No  . Drug use: Yes    Types: Methamphetamines, Marijuana    Comment: Couple of times per month  . Sexual activity: Yes    Birth control/protection: Injection  Other Topics Concern  . Not on file  Social History Narrative  . Not on file   Social Determinants of Health   Financial Resource Strain:   . Difficulty of Paying Living Expenses:   Food Insecurity:   . Worried About Charity fundraiser in the Last Year:   . Arboriculturist in the Last Year:   Transportation Needs:   . Film/video editor (Medical):   Marland Kitchen Lack of Transportation (Non-Medical):   Physical Activity:   . Days of Exercise per Week:   . Minutes of Exercise per Session:   Stress:   . Feeling of Stress :   Social Connections:   . Frequency of Communication with Friends and Family:   . Frequency of Social Gatherings with Friends and Family:   . Attends Religious Services:   . Active Member of Clubs or Organizations:   . Attends Archivist Meetings:   Marland Kitchen Marital Status:   Intimate Partner Violence:   . Fear of Current or Ex-Partner:   . Emotionally Abused:   Marland Kitchen Physically Abused:   . Sexually Abused:       Review of Systems  Constitutional: Negative for appetite change, chills, diaphoresis, fatigue, fever and unexpected weight change.  Eyes:       Negative for acute change in vision  Respiratory: Negative for chest tightness, shortness of breath and wheezing.   Cardiovascular: Negative for chest pain.  Gastrointestinal: Negative for diarrhea, nausea and vomiting.  Genitourinary: Negative for dysuria, pelvic pain and vaginal discharge.   Musculoskeletal: Negative for neck pain and neck stiffness.  Skin: Negative for rash.  Neurological: Negative for seizures, syncope, weakness and headaches.  Hematological: Negative for adenopathy. Does not bruise/bleed easily.  Psychiatric/Behavioral: Negative for hallucinations.       Objective:    BP 129/81   Pulse (!) 105   Wt 141 lb (64 kg)   BMI 21.76 kg/m  Nursing note and vital signs reviewed.  Physical Exam Constitutional:      General: She is not in acute distress.    Appearance: She is well-developed.  Eyes:     Conjunctiva/sclera: Conjunctivae normal.  Cardiovascular:     Rate and Rhythm: Normal rate and regular rhythm.     Heart sounds: Normal heart sounds. No murmur. No friction rub. No gallop.  Pulmonary:     Effort: Pulmonary effort is normal. No respiratory distress.     Breath sounds: Normal breath sounds. No wheezing or rales.  Chest:     Chest wall: No tenderness.  Abdominal:     General: Bowel sounds are normal.     Palpations: Abdomen is soft.     Tenderness: There is no abdominal tenderness.  Musculoskeletal:     Cervical back: Neck supple.  Lymphadenopathy:     Cervical: No cervical adenopathy.  Skin:    General: Skin is warm and dry.     Findings: No rash.  Neurological:     Mental Status: She is alert and oriented to person, place, and time.  Psychiatric:        Behavior: Behavior normal.        Thought Content: Thought content normal.        Judgment: Judgment normal.        Assessment & Plan:   Patient Active Problem List   Diagnosis Date Noted  . Healthcare maintenance 08/05/2019  . HIV (human immunodeficiency virus infection) ([place]) 12/31/2018  . Anxiety 05/29/2017  . Self-mutilation 05/29/2017  . Depression 06/07/2016  . Marijuana use 06/07/2016  . Low vitamin D level 03/15/2016  . Prediabetes 02/22/2016  . Asthma 02/22/2016  . ADD (attention deficit disorder) 02/22/2016  . 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  Hyperlipidemia with target LDL less than 70 01/31/2012  . Migraine 01/31/2012  . Tremor, 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     Problem List Items Addressed This Visit      Other   HIV (human immunodeficiency virus infection) ([place]) (Chronic)    Ms. Clendenin is a 39 y/o female with newly diagnosed HIV-1 disease with initial viral load of 23,300 and CD4 count of 577 with genotype showing wild type with no significant resistance. Risk factor for acquiring HIV is heterosexual contact. No acute retroviral syndrome or symptoms consistent with opportunistic infection. Discussed the pathogenesis, transmission, risks if left untreated, and treatment options for HIV. She has Medicaid and should have no problems obtaining medication. Met with pharmacy staff and [place] today. Food, tolietries and condoms provided. Start [employer]. Plan for follow up in 1 month or sooner if needed.       Relevant Medications   bictegravir-emtricitabine-tenofovir AF (BIKTARVY) 50-200-25 MG TABS tablet   Healthcare maintenance     Concern for current living situation - met with [place].   Discussed importance of safe sexual practice to reduce risk of STI. Condoms provided.       Other Visit Diagnoses    Human immunodeficiency virus (HIV) disease ([place])    -  Primary   Relevant Medications   bictegravir-emtricitabine-tenofovir AF (BIKTARVY) 50-200-25 MG TABS tablet       I have discontinued Nicola Girt. Louque's naproxen, valACYclovir, amoxicillin-clavulanate, and HYDROcodone-acetaminophen. I am also having her start on bictegravir-emtricitabine-tenofovir AF. Additionally, I am having her maintain her ibuprofen, albuterol, mupirocin ointment, hydrocortisone cream, diphenhydrAMINE, and fluticasone.   Meds ordered this encounter  Medications  . bictegravir-emtricitabine-tenofovir AF (BIKTARVY) 50-200-25 MG TABS tablet    Sig: Take 1 tablet by mouth daily.    Dispense:  30 tablet    Refill:  3    Order  Specific Question:   Supervising Provider    Answer:   Carlyle Basques [[id number]]     Follow-up: Return in about 1 month (around 09/05/2019), or if symptoms worsen or fail to improve.    Terri Piedra, MSN,  FNP-C Nurse Practitioner [facility] [place] number: [phone]

## 2019-08-05 NOTE — Telephone Encounter (Signed)
[facility] Patient Advocate Encounter    Findings of the benefits investigation:   Insurance: NCMED- active  Test run with drug historically used [employer]) Estimated copay amount: $3.00 Prior Authorization: not required  [facility] Patient Advocate will follow up once patient arrives for their appointment and see if any additional assistance is needed. She is able to fill at [facility].   Beulah Gandy, CPhT Specialty Pharmacy Patient [facility] Phone: [phone] Fax: [phone] 08/05/2019 8:02 AM

## 2019-08-05 NOTE — Assessment & Plan Note (Signed)
Kristin Hart is a 39 y/o female with newly diagnosed HIV-1 disease with initial viral load of 23,300 and CD4 count of 577 with genotype showing wild type with no significant resistance. Risk factor for acquiring HIV is heterosexual contact. No acute retroviral syndrome or symptoms consistent with opportunistic infection. Discussed the pathogenesis, transmission, risks if left untreated, and treatment options for HIV. She has Medicaid and should have no problems obtaining medication. Met with pharmacy staff and [place] today. Food, tolietries and condoms provided. Start [employer]. Plan for follow up in 1 month or sooner if needed.

## 2019-08-05 NOTE — Assessment & Plan Note (Signed)
   Concern for current living situation - met with [place].   Discussed importance of safe sexual practice to reduce risk of STI. Condoms provided.

## 2019-08-11 ENCOUNTER — Telehealth: Payer: Self-pay

## 2019-08-11 NOTE — Telephone Encounter (Signed)
Patient left voicemail on triage line requesting call regarding issues getting medication. States she is unable to get one of her medicines at the pharmacy and would like details.  Tried to call patient back with number provided. Unable to reach her at this time. Left voicemail requesting a call back. P: [phone] Lorenso Courier, CMA

## 2019-08-12 ENCOUNTER — Telehealth: Payer: Self-pay | Admitting: *Deleted

## 2019-08-12 DIAGNOSIS — B20 Human immunodeficiency virus [HIV] disease: Secondary | ICD-10-CM

## 2019-08-12 DIAGNOSIS — K219 Gastro-esophageal reflux disease without esophagitis: Secondary | ICD-10-CM

## 2019-08-12 MED ORDER — BICTEGRAVIR-EMTRICITAB-TENOFOV 50-200-25 MG PO TABS: 1.0000 | ORAL_TABLET | Freq: Every day | ORAL | 3 refills | Status: DC

## 2019-08-12 MED ORDER — OMEPRAZOLE 40 MG PO CPDR: 40.0000 mg | DELAYED_RELEASE_CAPSULE | Freq: Every day | ORAL | 1 refills | Status: DC

## 2019-08-12 NOTE — Telephone Encounter (Signed)
Sent in omeprazole 40 mg #30 with 1 refill, notified patient. Please cosign. Andree Coss, RN

## 2019-08-12 NOTE — Telephone Encounter (Signed)
I am fine with the omeprazole - she can do 40 mg daily that will not interfere with her medication. She does need to start her Biktarvy.

## 2019-08-12 NOTE — Addendum Note (Signed)
Addended by: Andree Coss on: 08/12/2019 04:27 PM   Modules accepted: Orders

## 2019-08-12 NOTE — Telephone Encounter (Signed)
Patient called for advice. She has not yet started [employer]. Resent to [facility] per patient's request.   She reports abdominal pain, burning, cramping and excessive belching for a few days plus watery diarrhea. She denies any fevers.  No one else around her is sick.  She is currently staying with her son's grandmother.  She does not have electricity where she normally stays, but does not think she has eaten any spoiled food (only eats shelf-stable food there).  She states this has been happening in cycles "ever since I was a little girl".  She found omeprazole and imodium to be helpful in the past.  Patient without transportation today. She cannot get to urgent care today.  RN encouraged patient to call her primary care, perhaps see if she can be connected to GI. RN gave her the phone number for medicaid transportation services as well. Andree Coss, RN

## 2019-09-04 ENCOUNTER — Ambulatory Visit: Payer: Medicaid Other | Admitting: Family

## 2019-09-08 ENCOUNTER — Ambulatory Visit: Payer: Medicaid Other | Admitting: Infectious Diseases

## 2019-10-12 ENCOUNTER — Other Ambulatory Visit: Payer: Self-pay

## 2019-10-12 ENCOUNTER — Ambulatory Visit
Admission: RE | Admit: 2019-10-12 | Discharge: 2019-10-12 | Disposition: A | Discharge status: Home / Self Care | Payer: Medicaid Other | Source: Ambulatory Visit | Attending: Infectious Diseases | Admitting: Infectious Diseases

## 2019-10-12 ENCOUNTER — Ambulatory Visit (INDEPENDENT_AMBULATORY_CARE_PROVIDER_SITE_OTHER): Payer: Medicaid Other | Admitting: Infectious Diseases

## 2019-10-12 ENCOUNTER — Encounter: Payer: Self-pay | Admitting: Infectious Diseases

## 2019-10-12 VITALS — BP 150/95 | HR 95 | Temp 98.6°F | Resp 17 | Ht 67.5 in | Wt 147.0 lb

## 2019-10-12 DIAGNOSIS — R202 Paresthesia of skin: Secondary | ICD-10-CM | POA: Diagnosis not present

## 2019-10-12 DIAGNOSIS — M533 Sacrococcygeal disorders, not elsewhere classified: Secondary | ICD-10-CM

## 2019-10-12 DIAGNOSIS — G8929 Other chronic pain: Secondary | ICD-10-CM | POA: Diagnosis not present

## 2019-10-12 DIAGNOSIS — B20 Human immunodeficiency virus [HIV] disease: Secondary | ICD-10-CM | POA: Diagnosis not present

## 2019-10-12 DIAGNOSIS — F1520 Other stimulant dependence, uncomplicated: Secondary | ICD-10-CM

## 2019-10-12 DIAGNOSIS — M545 Low back pain: Secondary | ICD-10-CM | POA: Diagnosis not present

## 2019-10-12 DIAGNOSIS — F99 Mental disorder, not otherwise specified: Secondary | ICD-10-CM

## 2019-10-12 IMAGING — CR DG LUMBAR SPINE 1V
1 series · 1 of 1 positions shown · non-contrast
Comparison: None.

CLINICAL DATA: Low back pain

EXAM:
LUMBAR SPINE - 1 VIEW

[t l-spine a.p.]
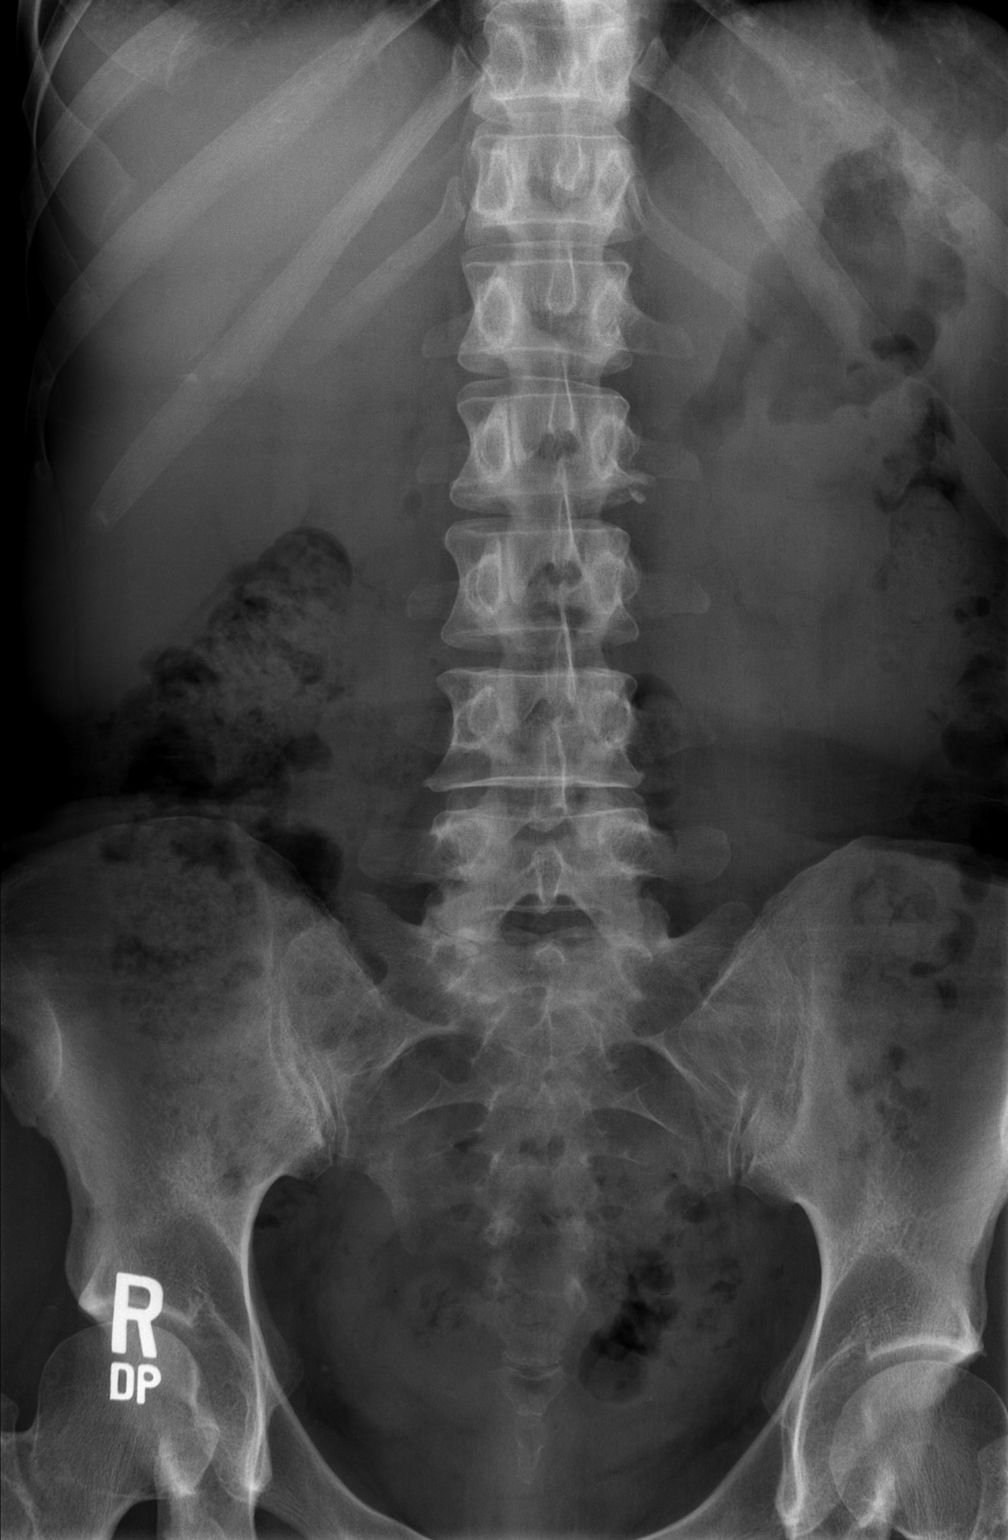

[1 of 1 positions shown; findings below may reference images not displayed]

FINDINGS: Frontal view only obtained per physician request. There are 5
non-rib-bearing lumbar type vertebral bodies. On this frontal view,
there is no fracture. No obvious disc space narrowing noted on
frontal view. There is a suggestion of osteoarthritic change at
L5-S1. There is moderate stool in the colon. There is no bowel
dilatation or air-fluid level to suggest bowel obstruction. No free
air.
IMPRESSION: On frontal view, no fracture evident. Suggestion of facet
osteoarthritic change at L5-S1. No bowel obstruction or free air.

## 2019-10-12 MED ORDER — BICTEGRAVIR-EMTRICITAB-TENOFOV 50-200-25 MG PO TABS: 1.0000 | ORAL_TABLET | Freq: Every day | ORAL | 3 refills | Status: DC

## 2019-10-12 NOTE — Progress Notes (Signed)
Subjective:    Patient ID: Kristin Hart, female    DOB: 1980-09-27, 39 y.o.   MRN: [id number]  Chief Complaint  Patient presents with  . Follow-up    HPI:  Kristin Hart is a 39 y.o. female with previous medical history of migraines, hyperlipidemia, attention deficit, depression/anxiety, and marijuana use who presents today for follow up regarding HIV disease.   Last office visit with [name] in May 2021 - started on Biktarvy once daily for HIV. Since that time she has struggled with the same housing situation where she has no electricity and some running water. She finds the conditions filthy and she worries about her health when she has to clean up other people's excrement.   She went to the [facility] today for some toiletries. She has been trying to work with [employer] but her phone only works with Wi-Fi.   She has a few things to discuss today - she has pains on the bottoms of her feet that feel like shards of glass and burning pain that is worse in her big toes. Her most severe pain today is over the right SI joint/hip area. She can barely touch this area. Attributes it to the pain she started with during incarceration where she had to sleep on metal board. She also has throbbing pains into her elbows down to wrists bilaterally. She cannot identify what aggravates it. Seems unassociated and sporadic.  Wonders if she has fibromyalgia.   She smokes meth daily - helps with her pain. She feels that her mental health has really suffered the last year. She would be open to seeing another psychiatric provider but gets fatigued with "going over her story over and over." She has been journaling lately and is desperately trying to figure out how to get out of current mood. She does hear voices but they are her own - almost to the point where she is arguing with herself. She has in the past had a history of self mutilating behavior but none recently. She has a 78 year old son who is "her reason" for continuing  and not giving up.    No Known Allergies   Outpatient Medications Prior to Visit  Medication Sig Dispense Refill  . albuterol (PROAIR HFA) 108 (90 Base) MCG/ACT inhaler Inhale 1 puff into the lungs every 4 (four) hours as needed.     . diphenhydrAMINE (BENADRYL) 25 MG tablet Take 1 tablet (25 mg total) by mouth every 6 (six) hours as needed. 30 tablet 0  . fluticasone (FLOVENT HFA) 44 MCG/ACT inhaler Inhale 2 puffs into the lungs 2 (two) times daily. 1 Inhaler 12  . hydrocortisone cream 1 % Apply to affected area 2 times daily 15 g 0  . ibuprofen (ADVIL,MOTRIN) 600 MG tablet Take 1 tablet (600 mg total) by mouth every 6 (six) hours as needed. (Patient taking differently: Take 800 mg by mouth every 6 (six) hours as needed. ) 30 tablet 0  . mupirocin ointment (BACTROBAN) 2 % APPLY SPARINGLY TO AFFECTED AREA 3 TIMES A DAY FOR 10 DAYS  0  . omeprazole (PRILOSEC) 40 MG capsule Take 1 capsule (40 mg total) by mouth daily. 30 capsule 1  . bictegravir-emtricitabine-tenofovir AF (BIKTARVY) 50-200-25 MG TABS tablet Take 1 tablet by mouth daily. 30 tablet 3   No facility-administered medications prior to visit.     Past Medical History:  Diagnosis Date  . Asthma   . Borderline personality disorder (HCC)    with schizophrenic  tendancies, per pt  . Diabetes mellitus without complication (HCC)    type 2  . Foreign body in small intestine   . HIV (human immunodeficiency virus infection) (HCC)   . Hypothyroidism   . Stomach ulcer       Past Surgical History:  Procedure Laterality Date  . CESAREAN SECTION N/A 07/04/2016   Procedure: CESAREAN SECTION;  Surgeon: [name], DO;  Location: [facility] BIRTHING SUITES;  Service: Obstetrics;  Laterality: N/A;  . ENTEROSCOPY N/A 11/08/2017   Procedure: ENTEROSCOPY;  Surgeon: Hilarie Fredrickson, MD;  Location: [facility] ENDOSCOPY;  Service: Endoscopy;  Laterality: N/A;  . NO PAST SURGERIES         Social History   Socioeconomic History  . Marital status:  Single    Spouse name: Not on file  . Number of children: 1  . Years of education: Not on file  . Highest education level: Not on file  Occupational History  . Occupation: Unemployed   Tobacco Use  . Smoking status: Never Smoker  . Smokeless tobacco: Never Used  Vaping Use  . Vaping Use: Never used  Substance and Sexual Activity  . Alcohol use: No  . Drug use: Yes    Types: Methamphetamines, Marijuana    Comment: Couple of times per month  . Sexual activity: Yes    Birth control/protection: Injection  Other Topics Concern  . Not on file  Social History Narrative  . Not on file   Social Determinants of Health   Financial Resource Strain:   . Difficulty of Paying Living Expenses:   Food Insecurity:   . Worried About Programme researcher, broadcasting/film/video in the Last Year:   . Barista in the Last Year:   Transportation Needs:   . Freight forwarder (Medical):   Marland Kitchen Lack of Transportation (Non-Medical):   Physical Activity:   . Days of Exercise per Week:   . Minutes of Exercise per Session:   Stress:   . Feeling of Stress :   Social Connections:   . Frequency of Communication with Friends and Family:   . Frequency of Social Gatherings with Friends and Family:   . Attends Religious Services:   . Active Member of Clubs or Organizations:   . Attends Banker Meetings:   Marland Kitchen Marital Status:   Intimate Partner Violence:   . Fear of Current or Ex-Partner:   . Emotionally Abused:   Marland Kitchen Physically Abused:   . Sexually Abused:       Review of Systems  Constitutional: Negative for appetite change, chills, diaphoresis, fatigue, fever and unexpected weight change.  Eyes:       Negative for acute change in vision  Respiratory: Negative for chest tightness, shortness of breath and wheezing.   Cardiovascular: Negative for chest pain.  Gastrointestinal: Negative for diarrhea, nausea and vomiting.  Genitourinary: Negative for dysuria, pelvic pain and vaginal discharge.    Musculoskeletal: Negative for neck pain and neck stiffness.  Skin: Negative for rash.  Neurological: Negative for seizures, syncope, weakness and headaches.  Hematological: Negative for adenopathy. Does not bruise/bleed easily.  Psychiatric/Behavioral: Negative for hallucinations.       Objective:    BP (!) 150/95   Pulse 95   Temp 98.6 F (37 C)   Resp 17   Ht 5' 7.5" (1.715 m)   Wt 147 lb (66.7 kg)   BMI 22.68 kg/m  Nursing note and vital signs reviewed.  Physical Exam Constitutional:  General: She is not in acute distress.    Appearance: She is well-developed.  Eyes:     Conjunctiva/sclera: Conjunctivae normal.  Cardiovascular:     Rate and Rhythm: Normal rate and regular rhythm.     Heart sounds: Normal heart sounds. No murmur heard.  No friction rub. No gallop.   Pulmonary:     Effort: Pulmonary effort is normal. No respiratory distress.     Breath sounds: Normal breath sounds. No wheezing or rales.  Chest:     Chest wall: No tenderness.  Abdominal:     General: Bowel sounds are normal.     Palpations: Abdomen is soft.     Tenderness: There is no abdominal tenderness.  Musculoskeletal:     Cervical back: Neck supple.  Lymphadenopathy:     Cervical: No cervical adenopathy.  Skin:    General: Skin is warm and dry.     Findings: Rash present.     Comments: Multiple scattered skin lesions resembling picking/scratching. None look acutely infected.   Neurological:     Mental Status: She is alert and oriented to person, place, and time.  Psychiatric:        Attention and Perception: She perceives auditory hallucinations. She does not perceive visual hallucinations.        Mood and Affect: Mood is depressed. Affect is tearful.        Speech: Speech is not rapid and pressured or tangential.        Behavior: Behavior normal.        Thought Content: Thought content is paranoid. Thought content is not delusional. Thought content does not include suicidal plan.         Judgment: Judgment normal.        Assessment & Plan:   Patient Active Problem List   Diagnosis Date Noted  . Mental health disorder 10/12/2019  . 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  . Paresthesias 10/12/2019  . Healthcare maintenance 08/05/2019  . HIV (human immunodeficiency virus infection) (HCC) 12/31/2018  . Anxiety 05/29/2017  . Self-mutilation 05/29/2017  . Depression 06/07/2016  . Marijuana use 06/07/2016  . Low vitamin D level 03/15/2016  . Prediabetes 02/22/2016  . Asthma 02/22/2016  . ADD (attention deficit disorder) 02/22/2016  . Methamphetamine use disorder, severe, 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/22/2016  . Hyperlipidemia with target LDL less than 70 01/31/2012  . Migraine 01/31/2012  . Tremor, 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     Problem List Items Addressed This Visit      Unprioritized   Chronic right SI joint pain    She is very tender overlying R SI joint. No warmth or tenderness. With complaints of shooting pains will check L-spine xray today for any concerns of lower back pathology. She has minimal tenderness to the left SI joint.       Relevant Orders   DG Lumbar Spine 1 View   HIV (human immunodeficiency virus infection) (HCC) - Primary (Chronic)    Will check VL and CD4 today for therapeutic monitoring. Refills provided for Biktarvy.  She will need STI screening at next appointment - we did not get to discus this today but I worry she is using sex to get drugs. Check RPR today.   She will return in 70m to see [name] to follow up on other concerns. She would do well to get in with Internal Medicine to help with other chronic medical conditions.       Relevant Medications   bictegravir-emtricitabine-tenofovir AF (BIKTARVY) 50-200-25 MG TABS  tablet   Other Relevant Orders   HIV-1 RNA quant-no reflex-bld   T-helper cell (CD4)- ([facility] clinic only)   COMPLETE METABOLIC PANEL WITH GFR   B12   Folate   RPR   Mental health disorder    ?bipolar depression vs multiple  personality vs alternative. We discussed that this is likely worsened by substance use, which is daily.  Will refer to Psychiatry for assistance with work up and recommendations for management. May benefit from Seroquel at night at least for now until this appointment. Will follow up at return in 5511m.       Relevant Orders   Ambulatory referral to Psychiatry   Methamphetamine use disorder, severe, dependence (HCC)    Ongoing - many of her symptoms I am sure are exacerbated by the daily use. She is not at a point where she can consider reducing or quitting since she uses it for pain management.       Paresthesias    "Creepy crawly" sensation to feet with symptoms of neuropathy. Will check Vitamin B12 and Folate.  She was previously T2DM.  HIV can be contributing if not under good control.  ?Methamphetamine side effect / withdrawal effect.  I hesitate to start Gabapentin for her at this time - can discuss at follow up or with psychiatry.          Rexene AlbertsStephanie Twanisha Foulk, MSN, NP-C [facility]  [email or web address] Pager: [phone] Office: [phone] [facility] Main Line: [phone]

## 2019-10-12 NOTE — Assessment & Plan Note (Signed)
?  bipolar depression vs multiple personality vs alternative. We discussed that this is likely worsened by substance use, which is daily.  Will refer to Psychiatry for assistance with work up and recommendations for management. May benefit from Seroquel at night at least for now until this appointment. Will follow up at return in 35m.

## 2019-10-12 NOTE — Assessment & Plan Note (Addendum)
Will check VL and CD4 today for therapeutic monitoring. Refills provided for Biktarvy.  She will need STI screening at next appointment - we did not get to discus this today but I worry she is using sex to get drugs. Check RPR today.   She will return in 68m to see [name] to follow up on other concerns. She would do well to get in with Internal Medicine to help with other chronic medical conditions.

## 2019-10-12 NOTE — Patient Instructions (Signed)
I sent in refills for your Biktarvy. Please continue taking this every day.   I will send in a referral for you to see psychiatry - I think this is the best place to help you get to feeling better and get you what you need.   Please come back in 1 month to see [name] and check in with [place] so we can start working through some of what's been going on one at a time.

## 2019-10-12 NOTE — Assessment & Plan Note (Signed)
She is very tender overlying R SI joint. No warmth or tenderness. With complaints of shooting pains will check L-spine xray today for any concerns of lower back pathology. She has minimal tenderness to the left SI joint.

## 2019-10-12 NOTE — Assessment & Plan Note (Signed)
"  Creepy crawly" sensation to feet with symptoms of neuropathy. Will check Vitamin B12 and Folate.  She was previously T2DM.  HIV can be contributing if not under good control.  ?Methamphetamine side effect / withdrawal effect.  I hesitate to start Gabapentin for her at this time - can discuss at follow up or with psychiatry.

## 2019-10-12 NOTE — Assessment & Plan Note (Addendum)
Ongoing - many of her symptoms I am sure are exacerbated by the daily use. She is not at a point where she can consider reducing or quitting since she uses it for pain management.

## 2019-10-13 ENCOUNTER — Telehealth: Payer: Self-pay

## 2019-10-13 LAB — T-HELPER CELL (CD4) - (RCID CLINIC ONLY)
CD4 % Helper T Cell: 48 % (ref 33–65)
CD4 T Cell Abs: 1015 /uL (ref 400–1790)

## 2019-10-13 NOTE — Telephone Encounter (Signed)
Patient returned call. Relayed provider recommendations. Patient was curious about VL count; labs have not resulted yet. Informed her we would notify her with any concerns.  Shanaya Schneck Loyola Mast, RN

## 2019-10-13 NOTE — Telephone Encounter (Signed)
-----   Message from Blanchard Kelch, NP sent at 10/13/2019  9:20 AM EDT ----- Kristin Hart has arthritis (bone joint wear and tear) changes on spine xray - best thing is to do some tylenol (2 extra strength every 8 hours) or ibuprofen (400 - 600 mg every 8 hours with food) for pain and inflammation.  Thank you kindly for calling her.

## 2019-10-13 NOTE — Telephone Encounter (Signed)
Patient phone number called, got in contact with mother instead. Informed patient's mother I was trying to get in contact with patient with results. Did not specify more than that since I couldn't verify further. Mother stated they would get phone to patient and will attempt to call later. Kristin Hart

## 2019-10-14 LAB — COMPLETE METABOLIC PANEL WITH GFR
AG Ratio: 1.3 (calc) (ref 1.0–2.5)
ALT: 18 U/L (ref 6–29)
AST: 30 U/L (ref 10–30)
Albumin: 3.6 g/dL (ref 3.6–5.1)
Alkaline phosphatase (APISO): 67 U/L (ref 31–125)
BUN: 24 mg/dL (ref 7–25)
CO2: 28 mmol/L (ref 20–32)
Calcium: 9 mg/dL (ref 8.6–10.2)
Chloride: 105 mmol/L (ref 98–110)
Creat: 0.85 mg/dL (ref 0.50–1.10)
GFR, Est African American: 101 mL/min/{1.73_m2} (ref >=60)
GFR, Est Non African American: 88 mL/min/{1.73_m2} (ref >=60)
Globulin: 2.7 g/dL (calc) (ref 1.9–3.7)
Glucose, Bld: 135 mg/dL — ABNORMAL HIGH (ref 65–99)
Potassium: 4 mmol/L (ref 3.5–5.3)
Sodium: 141 mmol/L (ref 135–146)
Total Bilirubin: 0.4 mg/dL (ref 0.2–1.2)
Total Protein: 6.3 g/dL (ref 6.1–8.1)

## 2019-10-14 LAB — VITAMIN B12: Vitamin B-12: 548 pg/mL (ref 200–1100)

## 2019-10-14 LAB — RPR: RPR Ser Ql: NONREACTIVE

## 2019-10-14 LAB — HIV-1 RNA QUANT-NO REFLEX-BLD
HIV 1 RNA Quant: <20 Copies/mL
HIV-1 RNA Quant, Log: <1.3 Log cps/mL

## 2019-10-14 LAB — FOLATE: Folate: 10.6 ng/mL

## 2019-10-15 ENCOUNTER — Telehealth: Payer: Self-pay

## 2019-10-15 NOTE — Telephone Encounter (Signed)
-----   Message from Blanchard Kelch, NP sent at 10/14/2019  3:55 PM EDT ----- Please call Kristin Hart to let her know that her viral load is undetectable and her medication is working perfectly. Keep taking it every day.

## 2019-10-15 NOTE — Telephone Encounter (Signed)
Called patient. Unable to leave VM at this time.  Kristin Hart

## 2019-10-30 ENCOUNTER — Emergency Department (HOSPITAL_COMMUNITY)
Admission: EM | Admit: 2019-10-30 | Discharge: 2019-10-30 | Disposition: A | Discharge status: Home / Self Care | Payer: Medicaid Other | Attending: Emergency Medicine | Admitting: Emergency Medicine

## 2019-10-30 ENCOUNTER — Other Ambulatory Visit: Payer: Self-pay

## 2019-10-30 ENCOUNTER — Encounter (HOSPITAL_COMMUNITY): Payer: Self-pay | Admitting: Pediatrics

## 2019-10-30 DIAGNOSIS — H1031 Unspecified acute conjunctivitis, right eye: Secondary | ICD-10-CM | POA: Diagnosis not present

## 2019-10-30 DIAGNOSIS — Z5321 Procedure and treatment not carried out due to patient leaving prior to being seen by health care provider: Secondary | ICD-10-CM | POA: Diagnosis not present

## 2019-10-30 NOTE — ED Notes (Signed)
Pt wanted to leave due to their friend being in a car accident and the wait time. Pt was encouraged to stay but pt left anyway.

## 2019-10-30 NOTE — ED Triage Notes (Signed)
Concern for staph infection on right eye; recently diagnose with stye but has not gone away,

## 2019-11-02 NOTE — Telephone Encounter (Signed)
Patient called office back for results. Relayed message from Britton, NP regarding undetectable VL. Patient is happy with results. Will follow up as planned. Lorenso Courier, [place]

## 2019-11-18 ENCOUNTER — Ambulatory Visit: Payer: Medicaid Other | Admitting: Family

## 2019-11-23 ENCOUNTER — Encounter: Payer: Self-pay | Admitting: Family

## 2019-11-23 ENCOUNTER — Ambulatory Visit (INDEPENDENT_AMBULATORY_CARE_PROVIDER_SITE_OTHER): Payer: Medicaid Other | Admitting: Family

## 2019-11-23 ENCOUNTER — Other Ambulatory Visit: Payer: Self-pay

## 2019-11-23 VITALS — BP 148/93 | HR 98 | Temp 98.1°F | Wt 141.0 lb

## 2019-11-23 DIAGNOSIS — Z599 Problem related to housing and economic circumstances, unspecified: Secondary | ICD-10-CM

## 2019-11-23 DIAGNOSIS — Z23 Encounter for immunization: Secondary | ICD-10-CM

## 2019-11-23 DIAGNOSIS — Z0000 Encounter for general adult medical examination without abnormal findings: Secondary | ICD-10-CM | POA: Diagnosis not present

## 2019-11-23 DIAGNOSIS — B20 Human immunodeficiency virus [HIV] disease: Secondary | ICD-10-CM | POA: Diagnosis not present

## 2019-11-23 DIAGNOSIS — G8929 Other chronic pain: Secondary | ICD-10-CM | POA: Diagnosis not present

## 2019-11-23 DIAGNOSIS — M533 Sacrococcygeal disorders, not elsewhere classified: Secondary | ICD-10-CM | POA: Diagnosis not present

## 2019-11-23 MED ORDER — BICTEGRAVIR-EMTRICITAB-TENOFOV 50-200-25 MG PO TABS: 1.0000 | ORAL_TABLET | Freq: Every day | ORAL | 3 refills | Status: DC

## 2019-11-23 NOTE — Patient Instructions (Addendum)
Nice to see you.  Continue to take your [place] daily as prescribed.  Refills have been sent to the pharmacy.  Continue to follow up with Lafonda Mosses and check on your disability.  We will plan to see you back in one month and will do blood work at that time.  Please be safe!  Have a great day!

## 2019-11-23 NOTE — Assessment & Plan Note (Signed)
Kristin Hart has well-controlled HIV disease with good adherence and tolerance to her ART regimen of Biktarvy. No signs/symptoms of opportunistic infection or progressive HIV disease. We reviewed her lab work and discussed plan of care. Continue current dose of Biktarvy. Plan for follow-up in 1 month or sooner if needed.

## 2019-11-23 NOTE — Assessment & Plan Note (Signed)
Continues to have right sided SI joint pain with arthritis noted on previous x-rays. Continue to treat conservatively with ice/heat and home exercise therapy coupled with Tylenol and ibuprofen as needed for pain.

## 2019-11-23 NOTE — Assessment & Plan Note (Signed)
·   Influenza vaccine given today.  Discussed/recommended Covid vaccination.  Discussed importance of safe sexual practice to reduce risk of STI. Condoms provided.  Cervical cancer screening up-to-date per recommendations.

## 2019-11-23 NOTE — Assessment & Plan Note (Signed)
Kristin Hart continues to live in a home without power but does have clean running water.  Working with case management and discussed importance of finding suitable housing.  Continue to monitor.

## 2019-11-23 NOTE — Progress Notes (Signed)
Subjective:    Patient ID: Kristin Hart, female    DOB: 1980-05-14, 39 y.o.   MRN: [id number]  Chief Complaint  Patient presents with  . Follow-up    B20     HPI:  Kristin Hart is a 39 y.o. female with HIV disease who was last seen in the office on 10/12/19 by Kristin Hart with multiple concerns for pain and mental health. Her vial load at the time was undetectable and CD4 count was 1015. She had several concerns about her living situation and was attempting to work with [facility]. Here today for routine follow up.   Kristin Hart has been taking her Biktarvy daily as prescribed with no adverse side effects or missed doses. Has noticed a slight increase in the frequency of headaches. She is feeling okay today and continues to have pain in her right back/hip that sometimes radiates down her leg as well as her elbows. Has been taking Tylenol and ibuprofen along with using Tiger Balm which does help. Denies fevers, chills, night sweats, changes in vision, neck pain/stiffness, nausea, diarrhea, vomiting, lesions or rashes.   Kristin Hart has no problems obtaining medication from the pharmacy. She continues to live in a home that does not have power but does have running water. She is living there because it is close to her son whom she gets to see and hold who is currently living with his grandmother (child's father's mother). She is also working with [name]. Continues to use Meth and marijuana and denies alcohol or tobacco. Condoms are provided per request.    No Known Allergies    Outpatient Medications Prior to Visit  Medication Sig Dispense Refill  . albuterol (PROAIR HFA) 108 (90 Base) MCG/ACT inhaler Inhale 1 puff into the lungs every 4 (four) hours as needed.     . diphenhydrAMINE (BENADRYL) 25 MG tablet Take 1 tablet (25 mg total) by mouth every 6 (six) hours as needed. 30 tablet 0  . fluticasone (FLOVENT HFA) 44 MCG/ACT inhaler Inhale 2 puffs into the lungs 2 (two) times  daily. 1 Inhaler 12  . hydrocortisone cream 1 % Apply to affected area 2 times daily 15 g 0  . ibuprofen (ADVIL,MOTRIN) 600 MG tablet Take 1 tablet (600 mg total) by mouth every 6 (six) hours as needed. (Patient taking differently: Take 800 mg by mouth every 6 (six) hours as needed. ) 30 tablet 0  . mupirocin ointment (BACTROBAN) 2 % APPLY SPARINGLY TO AFFECTED AREA 3 TIMES A DAY FOR 10 DAYS  0  . omeprazole (PRILOSEC) 40 MG capsule Take 1 capsule (40 mg total) by mouth daily. 30 capsule 1  . bictegravir-emtricitabine-tenofovir AF (BIKTARVY) 50-200-25 MG TABS tablet Take 1 tablet by mouth daily. 30 tablet 3   No facility-administered medications prior to visit.     Past Medical History:  Diagnosis Date  . Asthma   . 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 Diabetes mellitus without complication (HCC)    type 2  . Foreign body in small intestine   . HIV (human immunodeficiency virus infection) (HCC)   . Hypothyroidism   . Stomach ulcer      Past Surgical History:  Procedure Laterality Date  . CESAREAN SECTION N/A 07/04/2016   Procedure: CESAREAN SECTION;  Surgeon: Kristin Heritage, Kristin Hart;  Location: [facility] BIRTHING SUITES;  Service: Obstetrics;  Laterality: N/A;  . ENTEROSCOPY N/A 11/08/2017   Procedure: ENTEROSCOPY;  Surgeon: Kristin Hart,  Kristin Bonito, Kristin Hart;  Location: [facility] ENDOSCOPY;  Service: Endoscopy;  Laterality: N/A;  . NO PAST SURGERIES         Review of Systems  Constitutional: Negative for chills, diaphoresis, fatigue and fever.  Respiratory: Negative for cough, chest tightness, shortness of breath and wheezing.   Cardiovascular: Negative for chest pain.  Gastrointestinal: Negative for abdominal pain, diarrhea, nausea and vomiting.      Objective:    BP (!) 148/93   Pulse 98   Temp 98.1 F (36.7 C) (Oral)   Wt 141 lb (64 kg)   BMI 21.76 kg/m  Nursing note and vital signs reviewed.  Physical Exam Constitutional:      General: She is not  in acute distress.    Appearance: She is well-developed.  Eyes:     Conjunctiva/sclera: Conjunctivae normal.  Cardiovascular:     Rate and Rhythm: Normal rate and regular rhythm.     Heart sounds: Normal heart sounds. No murmur heard.  No friction rub. No gallop.   Pulmonary:     Effort: Pulmonary effort is normal. No respiratory distress.     Breath sounds: Normal breath sounds. No wheezing or rales.  Chest:     Chest wall: No tenderness.  Abdominal:     General: Bowel sounds are normal.     Palpations: Abdomen is soft.     Tenderness: There is no abdominal tenderness.  Musculoskeletal:     Cervical back: Neck supple.  Lymphadenopathy:     Cervical: No cervical adenopathy.  Skin:    General: Skin is warm and dry.     Findings: No rash.  Neurological:     Mental Status: She is alert and oriented to person, place, and time.  Psychiatric:        Behavior: Behavior normal.        Thought Content: Thought content normal.        Judgment: Judgment normal.      Depression screen [facility] 2/9 10/12/2019 08/05/2019 05/30/2017 05/30/2017 08/21/2016  Decreased Interest 1 0 0 0 0  Down, Depressed, Hopeless 1 1 0 1 1  PHQ - 2 Score 2 1 0 1 1  Altered sleeping 3 - 1 - 1  Tired, decreased energy 1 - 1 - 1  Change in appetite 3 - 1 - 0  Feeling bad or failure about yourself  3 - 1 - 1  Trouble concentrating 3 - 1 - 0  Moving slowly or fidgety/restless - - 1 - 1  Suicidal thoughts 0 - 0 - 0  PHQ-9 Score 15 - 6 - 5  Difficult doing work/chores - - Somewhat difficult - -       Assessment & Plan:    Patient Active Problem List   Diagnosis Date Noted  . Housing problems 11/23/2019  . Mental health disorder 10/12/2019  . 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  . Paresthesias 10/12/2019  . Healthcare maintenance 08/05/2019  . HIV (human immunodeficiency virus infection) (HCC) 12/31/2018  . Anxiety 05/29/2017  . Self-mutilation 05/29/2017  . Depression 06/07/2016  . Marijuana use  06/07/2016  . Low vitamin D level 03/15/2016  . Prediabetes 02/22/2016  . Asthma 02/22/2016  . ADD (attention deficit disorder) 02/22/2016  . Methamphetamine use disorder, severe, 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/22/2016  . Hyperlipidemia with target LDL less than 70 01/31/2012  . Migraine 01/31/2012  . Tremor, 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     Problem List Items Addressed This Visit      Other  HIV (human immunodeficiency virus infection) (HCC) - Primary (Chronic)    Ms. Petitjean has well-controlled HIV disease with good adherence and tolerance to her ART regimen of Biktarvy. No signs/symptoms of opportunistic infection or progressive HIV disease. We reviewed her lab work and discussed plan of care. Continue current dose of Biktarvy. Plan for follow-up in 1 month or sooner if needed.      Relevant Medications   bictegravir-emtricitabine-tenofovir AF (BIKTARVY) 50-200-25 MG TABS tablet   Healthcare maintenance     Influenza vaccine given today.  Discussed/recommended Covid vaccination.  Discussed importance of safe sexual practice to reduce risk of STI. Condoms provided.  Cervical cancer screening up-to-date per recommendations.      Chronic right SI joint pain    Continues to have right sided SI joint pain with arthritis noted on previous x-rays. Continue to treat conservatively with ice/heat and home exercise therapy coupled with Tylenol and ibuprofen as needed for pain.      Housing problems    Ms. Goldberg continues to live in a home without power but does have clean running water.  Working with case management and discussed importance of finding suitable housing.  Continue to monitor.       Other Visit Diagnoses    Need for immunization against influenza       Relevant Orders   Flu Vaccine QUAD 36+ mos IM (Completed)       I am having Alvan Dame. Boggus maintain her ibuprofen, albuterol, mupirocin ointment, hydrocortisone cream, diphenhydrAMINE, fluticasone, omeprazole, and  bictegravir-emtricitabine-tenofovir AF.   Meds ordered this encounter  Medications  . bictegravir-emtricitabine-tenofovir AF (BIKTARVY) 50-200-25 MG TABS tablet    Sig: Take 1 tablet by mouth daily.    Dispense:  30 tablet    Refill:  3    Order Specific Question:   Supervising Provider    Answer:   Judyann Munson [[id number]]     Follow-up: Return in about 1 month (around 12/23/2019), or if symptoms worsen or fail to improve.   Marcos Eke, MSN, FNP-C Nurse Practitioner [facility] Main number: [phone]

## 2019-12-04 ENCOUNTER — Other Ambulatory Visit: Payer: Self-pay

## 2019-12-04 ENCOUNTER — Encounter: Payer: Self-pay | Admitting: Family Medicine

## 2019-12-04 ENCOUNTER — Ambulatory Visit (INDEPENDENT_AMBULATORY_CARE_PROVIDER_SITE_OTHER): Payer: Medicaid Other | Admitting: Family Medicine

## 2019-12-04 VITALS — BP 128/82 | HR 86 | Wt 144.0 lb

## 2019-12-04 DIAGNOSIS — G8929 Other chronic pain: Secondary | ICD-10-CM

## 2019-12-04 DIAGNOSIS — R7303 Prediabetes: Secondary | ICD-10-CM

## 2019-12-04 DIAGNOSIS — Z0000 Encounter for general adult medical examination without abnormal findings: Secondary | ICD-10-CM | POA: Diagnosis not present

## 2019-12-04 DIAGNOSIS — M25521 Pain in right elbow: Secondary | ICD-10-CM

## 2019-12-04 DIAGNOSIS — B20 Human immunodeficiency virus [HIV] disease: Secondary | ICD-10-CM | POA: Diagnosis not present

## 2019-12-04 DIAGNOSIS — J4521 Mild intermittent asthma with (acute) exacerbation: Secondary | ICD-10-CM | POA: Diagnosis not present

## 2019-12-04 DIAGNOSIS — F419 Anxiety disorder, unspecified: Secondary | ICD-10-CM | POA: Diagnosis not present

## 2019-12-04 DIAGNOSIS — G43909 Migraine, unspecified, not intractable, without status migrainosus: Secondary | ICD-10-CM | POA: Diagnosis not present

## 2019-12-04 DIAGNOSIS — M533 Sacrococcygeal disorders, not elsewhere classified: Secondary | ICD-10-CM | POA: Diagnosis not present

## 2019-12-04 LAB — POCT GLYCOSYLATED HEMOGLOBIN (HGB A1C): HbA1c, POC (controlled diabetic range): 6.6 % (ref 0.0–7.0)

## 2019-12-04 LAB — POCT URINE PREGNANCY: Preg Test, Ur: NEGATIVE

## 2019-12-04 MED ORDER — CETIRIZINE HCL 10 MG PO TABS: 10.0000 mg | ORAL_TABLET | Freq: Every day | ORAL | 11 refills | Status: DC

## 2019-12-04 MED ORDER — DICLOFENAC SODIUM 1 % EX GEL: 2.0000 g | Freq: Four times a day (QID) | CUTANEOUS | 3 refills | Status: DC | PRN

## 2019-12-04 MED ORDER — VENLAFAXINE HCL ER 37.5 MG PO CP24: 37.5000 mg | ORAL_CAPSULE | Freq: Every day | ORAL | 0 refills | Status: DC

## 2019-12-04 NOTE — Assessment & Plan Note (Signed)
Right elbow pain.  Consider nerve impingement.  Minimally improved with ibuprofen. -Voltaren gel -Reevaluate at next visit

## 2019-12-04 NOTE — Assessment & Plan Note (Signed)
Continues to have right-sided SI joint pain.  Minimally improved with ibuprofen. -Voltaren gel -Reevaluate at next visit

## 2019-12-04 NOTE — Assessment & Plan Note (Signed)
Patient currently taking albuterol inhaler.  Requesting prescription for cetirizine

## 2019-12-04 NOTE — Assessment & Plan Note (Signed)
Patient reports increased anxiety and decreased sense of self-worth.  PHQ-9 score of 7. -Start venlafaxine 37.5 g daily -Follow-up in 2 weeks

## 2019-12-04 NOTE — Assessment & Plan Note (Addendum)
Patient has history of migraines, previously medicated with unknown PRN medication.  Currently having 3-4 migraines per month. -Venlafaxine 37.5 mg -Follow-up in 2 weeks

## 2019-12-04 NOTE — Assessment & Plan Note (Signed)
HbA1c today was 6.6.  Counseled on diet.  Repeat HbA1c in 3 months

## 2019-12-04 NOTE — Progress Notes (Signed)
SUBJECTIVE:   CHIEF COMPLAINT / HPI:   Elbow pain Patient reports that she has been having elbow pain for over 6 months.  She has no reportable injury but it has been gradually worsening and is extremely painful.  She describes the pain as sharp worsens with movement.  Ibuprofen "knocks the edge off", she also states she has been taking Tiger balm which helped but states that she was told it "burns of the top layer of the skin". -Giving Voltaren gel  -Continue ibuprofen as needed  Buttock pain Patient reports that she has had right buttock pain for the last 3 years.  It started when she was pregnant with her son, and she was in jail sleeping on the metal frame and began to have the pain.  It is started to worsen and she has already [place] she sleeps on for the last 6 months.  She describes the pain as a dull constant ache that over the course of the day worsened and radiates down to her leg.  Notes that she has to do several position changes of sitting in 1 position for long time.  Ibuprofen minimally helps. -Can consider trying Voltaren gel -Position changes and stretching to alleviate the pain may help -Ibuprofen as needed -Consider reevaluation at next visit  Migraines Patient has a history of frequent migraines 1.5 years ago she was medicated, although she is on sure of what medication she was taking; it was symptom relief only not a daily medication.  Recently she reports 3-4 migraines per month with occasional nausea, photophobia, phonophobia, worse pain with movement.  Reports occasional associated aura. -Starting venlafaxine 37.5 mg daily  Depression/anxiety Patient has struggled with depression anxiety for a long time.  She has many social situations that are causing her stressors.  She is also recently lost several months been diagnosed with HIV. -Starting venlafaxine 37.5 mg daily  Prediabetes At last visit to clinic 1 year ago, patient had a HbA1c of 6.7 and was lost to  follow-up.  Repeat point-of-care HbA1c today is 6.6 patient has many social situations that limit her ability to change diet and get medications.  At this time we will continue to monitor. -Repeat HbA1c in 3 months  HIV Patient was recently diagnosed with HIV, she thinks that she had gotten it from her son's father with whom she still has intimate relations.  She reports he states that he is HIV negative, shin thinks he is the source as she states he has relations with a known HIV positive person.  Currently being seen at the [facility] clinic.  She also has support and help through [facility] [place].  Patient reports that she uses condoms for contraception/STD/STI protection, but has not had her Depo shot recently. -Counseled on restarting Depo  Social -Patient is a 38-year-old son that lives with paternal grandmother, CPS not involved. -Patient reports that she takes meth weekly, denies alcohol and tobacco use -Patient lives in a house with no electricity, with alcoholics, in a very dirty situation.  She has a high financial strain.  PERTINENT  PMH / PSH: HIV, prediabetes, methamphetamine use, anxiety, chronic right SI joint pain  OBJECTIVE:   BP 128/82   Pulse 86   Wt 144 lb (65.3 kg)   LMP 11/17/2019 (Approximate)   SpO2 98%   BMI 22.22 kg/m   General: Female sitting in clinic, NAD CV: RRR, no M/R/G appreciated MSK tender to palpation of right elbow that is increased  with passive and active movement.  Tenderness to right gluteal muscles with exacerbation with every movement.  Right SI joint tenderness.  Normal findings on left side Derm: Areas of excoriation and picking on arms and legs.  ASSESSMENT/PLAN:   Migraine Patient has history of migraines, previously medicated with unknown PRN medication.  Currently having 3-4 migraines per month. -Venlafaxine 37.5 mg -Follow-up in 2 weeks  Healthcare maintenance Patient is HIV positive, currently uses condoms for  contraception.  Previously was on Depo shot: Counseled on continuing condom use and restarting Depo.  Anxiety Patient reports increased anxiety and decreased sense of self-worth.  PHQ-9 score of 7. -Start venlafaxine 37.5 g daily -Follow-up in 2 weeks  Prediabetes HbA1c today was 6.6.  Counseled on diet.  Repeat HbA1c in 3 months  Elbow pain, right Right elbow pain.  Consider nerve impingement.  Minimally improved with ibuprofen. -Voltaren gel -Reevaluate at next visit  Chronic right SI joint pain Continues to have right-sided SI joint pain.  Minimally improved with ibuprofen. -Voltaren gel -Reevaluate at next visit  Asthma Patient currently taking albuterol inhaler.  Requesting prescription for cetirizine     Evelena Leyden, DO [facility]

## 2019-12-04 NOTE — Patient Instructions (Signed)
It was so great meeting you today! Today we discussed the following:   Elbow pain: we are giving you voltaren gel to take for your pain.  Buttock pain: you can try and use the voltaren gel and see if that helps with the pain. Otherwise, try working on position changes and stretching your leg in positions that allow for you to feel the stretch but not be overwhelmed by pain.  Headaches: we are giving your venlafaxine, which is a medication that can help with depression/anxiety, migraines, and neuropathy. I will need to see you back in 2 weeks and this medication needs to be taken daily.  If you have any questions, don't hesitate to ask and call.  Dr. Clayborne Artist

## 2019-12-04 NOTE — Assessment & Plan Note (Signed)
Patient is HIV positive, currently uses condoms for contraception.  Previously was on Depo shot: Counseled on continuing condom use and restarting Depo.

## 2019-12-24 ENCOUNTER — Encounter: Payer: Self-pay | Admitting: Family

## 2019-12-24 ENCOUNTER — Other Ambulatory Visit: Payer: Self-pay

## 2019-12-24 ENCOUNTER — Ambulatory Visit (INDEPENDENT_AMBULATORY_CARE_PROVIDER_SITE_OTHER): Payer: Medicaid Other | Admitting: Family

## 2019-12-24 VITALS — BP 146/97 | HR 118 | Temp 97.7°F | Wt 143.0 lb

## 2019-12-24 DIAGNOSIS — Z599 Problem related to housing and economic circumstances, unspecified: Secondary | ICD-10-CM | POA: Diagnosis not present

## 2019-12-24 DIAGNOSIS — B20 Human immunodeficiency virus [HIV] disease: Secondary | ICD-10-CM

## 2019-12-24 DIAGNOSIS — Z0000 Encounter for general adult medical examination without abnormal findings: Secondary | ICD-10-CM

## 2019-12-24 NOTE — Assessment & Plan Note (Signed)
Kristin Hart continues to have well-controlled HIV disease with good adherence and tolerance to her ART regimen of Biktarvy.  No signs/symptoms of opportunistic infection or progressive HIV disease.  We reviewed previous lab work and discussed plan of care.  Continue current dose of Biktarvy.  Plan for follow-up in 1 month or sooner if needed with lab work on the same day.

## 2019-12-24 NOTE — Progress Notes (Signed)
Subjective:    Patient ID: Kristin Hart, female    DOB: 23-Apr-1980, 39 y.o.   MRN: [id number]  Chief Complaint  Patient presents with  . Follow-up    B20     HPI:  Kristin Hart is a 39 y.o. female with HIV disease who was last seen in the office on 11/23/2019 with good adherence and tolerance to her ART regimen of Biktarvy.  Blood work at the time showed a viral load that was undetectable with CD4 count of 1015.  Here today for routine follow-up.  Ms. Rodier continues to take her Biktarvy daily as prescribed with no adverse side effects or missed doses since her last office visit.  She is feeling okay today.  Continues to have a less than optimal living situation which she describes waxes and wanes and has slightly worsened since she was last seen.  She feels safe majority of the time.  There is still no working electricity. Denies fevers, chills, night sweats, headaches, changes in vision, neck pain/stiffness, nausea, diarrhea, vomiting, lesions or rashes.  Ms. Aten continues to have no problems obtaining her medication from the pharmacy.  Denies feelings of being down, depressed, or hopeless recently.  She does smoke crystal meth and denies tobacco and alcohol consumption.  She is sexually active and uses condoms. Requesting condoms today.    No Known Allergies    Outpatient Medications Prior to Visit  Medication Sig Dispense Refill  . albuterol (PROAIR HFA) 108 (90 Base) MCG/ACT inhaler Inhale 1 puff into the lungs every 4 (four) hours as needed.     . bictegravir-emtricitabine-tenofovir AF (BIKTARVY) 50-200-25 MG TABS tablet Take 1 tablet by mouth daily. 30 tablet 3  . cetirizine (ZYRTEC) 10 MG tablet Take 1 tablet (10 mg total) by mouth daily. 30 tablet 11  . diclofenac Sodium (VOLTAREN) 1 % GEL Apply 2 g topically 4 (four) times daily as needed (for elbow pain). 2 g 3  . hydrocortisone cream 1 % Apply to affected area 2 times daily 15 g 0  . ibuprofen (ADVIL,MOTRIN) 600  MG tablet Take 1 tablet (600 mg total) by mouth every 6 (six) hours as needed. (Patient taking differently: Take 800 mg by mouth every 6 (six) hours as needed. ) 30 tablet 0  . mupirocin ointment (BACTROBAN) 2 % APPLY SPARINGLY TO AFFECTED AREA 3 TIMES A DAY FOR 10 DAYS  0  . omeprazole (PRILOSEC) 40 MG capsule Take 1 capsule (40 mg total) by mouth daily. 30 capsule 1  . venlafaxine XR (EFFEXOR XR) 37.5 MG 24 hr capsule Take 1 capsule (37.5 mg total) by mouth daily with breakfast. (Patient not taking: Reported on 12/24/2019) 30 capsule 0   No facility-administered medications prior to visit.     Past Medical History:  Diagnosis Date  . Asthma   . 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 Diabetes mellitus without complication (HCC)    type 2  . Foreign body in small intestine   . HIV (human immunodeficiency virus infection) (HCC)   . Hypothyroidism   . Stomach ulcer      Past Surgical History:  Procedure Laterality Date  . CESAREAN SECTION N/A 07/04/2016   Procedure: CESAREAN SECTION;  Surgeon: [name], DO;  Location: [facility] BIRTHING SUITES;  Service: Obstetrics;  Laterality: N/A;  . ENTEROSCOPY N/A 11/08/2017   Procedure: ENTEROSCOPY;  Surgeon: Hilarie Fredrickson, MD;  Location: [facility] ENDOSCOPY;  Service: Endoscopy;  Laterality:  N/A;  . NO PAST SURGERIES         Review of Systems  Constitutional: Negative for appetite change, chills, diaphoresis, fatigue, fever and unexpected weight change.  Eyes:       Negative for acute change in vision  Respiratory: Negative for chest tightness, shortness of breath and wheezing.   Cardiovascular: Negative for chest pain.  Gastrointestinal: Negative for diarrhea, nausea and vomiting.  Genitourinary: Negative for dysuria, pelvic pain and vaginal discharge.  Musculoskeletal: Negative for neck pain and neck stiffness.  Skin: Negative for rash.  Neurological: Negative for seizures, syncope, weakness and  headaches.  Hematological: Negative for adenopathy. Does not bruise/bleed easily.  Psychiatric/Behavioral: Negative for hallucinations.      Objective:    BP (!) 146/97   Pulse (!) 118   Temp 97.7 F (36.5 C) (Oral)   Wt 143 lb (64.9 kg)   BMI 22.07 kg/m  Nursing note and vital signs reviewed.  Physical Exam Constitutional:      General: She is not in acute distress.    Appearance: She is well-developed.  Eyes:     Conjunctiva/sclera: Conjunctivae normal.  Cardiovascular:     Rate and Rhythm: Normal rate and regular rhythm.     Heart sounds: Normal heart sounds. No murmur heard.  No friction rub. No gallop.   Pulmonary:     Effort: Pulmonary effort is normal. No respiratory distress.     Breath sounds: Normal breath sounds. No wheezing or rales.  Chest:     Chest wall: No tenderness.  Abdominal:     General: Bowel sounds are normal.     Palpations: Abdomen is soft.     Tenderness: There is no abdominal tenderness.  Musculoskeletal:     Cervical back: Neck supple.  Lymphadenopathy:     Cervical: No cervical adenopathy.  Skin:    General: Skin is warm and dry.     Findings: No rash.  Neurological:     Mental Status: She is alert and oriented to person, place, and time.  Psychiatric:        Behavior: Behavior normal.        Thought Content: Thought content normal.        Judgment: Judgment normal.      Depression screen [facility] 2/9 12/04/2019 10/12/2019 08/05/2019 05/30/2017 05/30/2017  Decreased Interest 0 1 0 0 0  Down, Depressed, Hopeless 1 1 1  0 1  PHQ - 2 Score 1 2 1  0 1  Altered sleeping 1 3 - 1 -  Tired, decreased energy 1 1 - 1 -  Change in appetite 0 3 - 1 -  Feeling bad or failure about yourself  3 3 - 1 -  Trouble concentrating 1 3 - 1 -  Moving slowly or fidgety/restless 0 - - 1 -  Suicidal thoughts 0 0 - 0 -  PHQ-9 Score 7 15 - 6 -  Difficult doing work/chores Very difficult - - Somewhat difficult -  Some recent data might be hidden       Assessment  & Plan:    Patient Active Problem List   Diagnosis Date Noted  . Elbow pain, right 12/04/2019  . Housing problems 11/23/2019  . Mental health disorder 10/12/2019  . 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  . Paresthesias 10/12/2019  . Healthcare maintenance 08/05/2019  . HIV (human immunodeficiency virus infection) (HCC) 12/31/2018  . Anxiety 05/29/2017  . Self-mutilation 05/29/2017  . Depression 06/07/2016  . Marijuana use 06/07/2016  .  Low vitamin D level 03/15/2016  . Prediabetes 02/22/2016  . Asthma 02/22/2016  . ADD (attention deficit disorder) 02/22/2016  . Methamphetamine use disorder, severe, 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/22/2016  . Hyperlipidemia with target LDL less than 70 01/31/2012  . Migraine 01/31/2012  . Tremor, 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     Problem List Items Addressed This Visit      Other   HIV (human immunodeficiency virus infection) (HCC) - Primary (Chronic)    Ms. Slagel continues to have well-controlled HIV disease with good adherence and tolerance to her ART regimen of Biktarvy.  No signs/symptoms of opportunistic infection or progressive HIV disease.  We reviewed previous lab work and discussed plan of care.  Continue current dose of Biktarvy.  Plan for follow-up in 1 month or sooner if needed with lab work on the same day.      Healthcare maintenance     Discussed importance of safe sexual practice to reduce risk of STI.  Condoms provided.  Due for routine dental care and will arrange through [facility]  Influenza vaccination up-to-date per recommendations.      Housing problems    Ms. Herrig continues to have problems with her housing with no electricity.  I am also concerned about her wellbeing although she assures that she is okay.  Discussed importance of having resources available with additional resources provided.  She will continue to work with [name] for assistance. Food provided today. Will continue to monitor.           I have discontinued  Alvan Dame. Raia's venlafaxine XR. I am also having her maintain her ibuprofen, albuterol, mupirocin ointment, hydrocortisone cream, omeprazole, bictegravir-emtricitabine-tenofovir AF, cetirizine, and diclofenac Sodium.   Follow-up: Return in about 1 month (around 01/24/2020), or if symptoms worsen or fail to improve.   Marcos Eke, MSN, FNP-C Nurse Practitioner [facility] Main number: [phone]

## 2019-12-24 NOTE — Assessment & Plan Note (Signed)
Kristin Hart continues to have problems with her housing with no electricity.  I am also concerned about her wellbeing although she assures that she is okay.  Discussed importance of having resources available with additional resources provided.  She will continue to work with [name] for assistance. Food provided today. Will continue to monitor.

## 2019-12-24 NOTE — Patient Instructions (Signed)
Nice to see you.  Continue to take your [place] daily as prescribed.  Refills will be sent to the pharmacy.  Recommend an appointment with our counselor Marylu Lund.   Continue working with Dorene Grebe from [facility].  Have a great day and be safe!

## 2019-12-24 NOTE — Assessment & Plan Note (Signed)
   Discussed importance of safe sexual practice to reduce risk of STI.  Condoms provided.  Due for routine dental care and will arrange through [facility]  Influenza vaccination up-to-date per recommendations.

## 2020-01-21 ENCOUNTER — Ambulatory Visit (INDEPENDENT_AMBULATORY_CARE_PROVIDER_SITE_OTHER): Payer: Medicaid Other | Admitting: Family

## 2020-01-21 ENCOUNTER — Ambulatory Visit: Payer: Medicaid Other

## 2020-01-21 ENCOUNTER — Other Ambulatory Visit: Payer: Self-pay

## 2020-01-21 ENCOUNTER — Other Ambulatory Visit (HOSPITAL_COMMUNITY)
Admission: RE | Admit: 2020-01-21 | Discharge: 2020-01-21 | Disposition: A | Discharge status: Home / Self Care | Payer: Medicaid Other | Source: Ambulatory Visit | Attending: Family | Admitting: Family

## 2020-01-21 ENCOUNTER — Encounter: Payer: Self-pay | Admitting: Family

## 2020-01-21 VITALS — BP 144/96 | HR 106 | Temp 98.0°F | Wt 149.2 lb

## 2020-01-21 DIAGNOSIS — Z113 Encounter for screening for infections with a predominantly sexual mode of transmission: Secondary | ICD-10-CM

## 2020-01-21 DIAGNOSIS — Z0000 Encounter for general adult medical examination without abnormal findings: Secondary | ICD-10-CM

## 2020-01-21 DIAGNOSIS — F1520 Other stimulant dependence, uncomplicated: Secondary | ICD-10-CM

## 2020-01-21 DIAGNOSIS — B20 Human immunodeficiency virus [HIV] disease: Secondary | ICD-10-CM

## 2020-01-21 MED ORDER — BICTEGRAVIR-EMTRICITAB-TENOFOV 50-200-25 MG PO TABS: 1.0000 | ORAL_TABLET | Freq: Every day | ORAL | 3 refills | Status: DC

## 2020-01-21 MED ORDER — ALBUTEROL SULFATE HFA 108 (90 BASE) MCG/ACT IN AERS: 1.0000 | INHALATION_SPRAY | RESPIRATORY_TRACT | 2 refills | Status: DC | PRN

## 2020-01-21 NOTE — Assessment & Plan Note (Signed)
Kristin Hart continues to use methamphetamine likely the result of her increasing blood pressure.  Recommended counseling for which she has an appointment here in our office.  Continue to monitor.

## 2020-01-21 NOTE — Patient Instructions (Addendum)
Nice to see you.   Continue to take your [place] daily as prescribed.   Refills will be sent to the pharmacy.  We will check your blood work today.  Plan for follow up in 1 month or sooner if needed.   Have a great day and stay safe!

## 2020-01-21 NOTE — Assessment & Plan Note (Signed)
Kristin Hart appears to have good adherence and tolerance to her ART regimen of Biktarvy.  No signs/symptoms of opportunistic infection or progressive HIV disease.  Reviewed previous lab work and discussed plan of care.  Continue current dose of Biktarvy.  Check blood work today.  Plan for follow-up in 1 month or sooner if needed.

## 2020-01-21 NOTE — Progress Notes (Signed)
Subjective:    Patient ID: Kristin Hart, female    DOB: September 19, 1980, 39 y.o.   MRN: [id number]  Chief Complaint  Patient presents with   Follow-up    blood pressure concerns     HPI:  Kristin Hart is a 39 y.o. female with HIV disease who was last seen in the office on 12/24/2019 with good adherence and tolerance to her ART regimen of Biktarvy.  Viral load at the time was undetectable with CD4 count of 1015.  We discussed having a safety plan in the event she should need it.  Here today for routine follow-up.  Kristin Hart has been taking her Biktarvy daily as prescribed with no adverse side effects or missed doses since her last office visit.  Overall feeling well today.  No new concerns/complaints.  She is feeling better from previous office visits.  Has also developed a plan should she need it. Denies fevers, chills, night sweats, headaches, changes in vision, neck pain/stiffness, nausea, diarrhea, vomiting, lesions or rashes.  Kristin Hart has no problems obtaining her medication from the pharmacy and remains covered through [facility].  Denies any feelings of being down, depressed, or hopeless recently.  She has not been able to see her son recently secondary to a household contact that was diagnosed with Covid.  Continues to use methamphetamine and marijuana.  No tobacco use or alcohol consumption.  Remains sexually active and requests condoms.  She is due for routine dental work and cervical cancer screening through Pap smear.  Recently tested negative for pregnancy and not currently on birth control.    No Known Allergies    Outpatient Medications Prior to Visit  Medication Sig Dispense Refill   cetirizine (ZYRTEC) 10 MG tablet Take 1 tablet (10 mg total) by mouth daily. 30 tablet 11   diclofenac Sodium (VOLTAREN) 1 % GEL Apply 2 g topically 4 (four) times daily as needed (for elbow pain). 2 g 3   hydrocortisone cream 1 % Apply to affected area 2 times daily 15 g 0    ibuprofen (ADVIL,MOTRIN) 600 MG tablet Take 1 tablet (600 mg total) by mouth every 6 (six) hours as needed. (Patient taking differently: Take 800 mg by mouth every 6 (six) hours as needed. ) 30 tablet 0   mupirocin ointment (BACTROBAN) 2 % APPLY SPARINGLY TO AFFECTED AREA 3 TIMES A DAY FOR 10 DAYS  0   omeprazole (PRILOSEC) 40 MG capsule Take 1 capsule (40 mg total) by mouth daily. 30 capsule 1   albuterol (PROAIR HFA) 108 (90 Base) MCG/ACT inhaler Inhale 1 puff into the lungs every 4 (four) hours as needed.      bictegravir-emtricitabine-tenofovir AF (BIKTARVY) 50-200-25 MG TABS tablet Take 1 tablet by mouth daily. 30 tablet 3   No facility-administered medications prior to visit.     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, per pt   Diabetes mellitus without complication (HCC)    type 2   Foreign body in small intestine    HIV (human immunodeficiency virus infection) (HCC)    Hypothyroidism    Stomach ulcer      Past Surgical History:  Procedure Laterality Date   CESAREAN SECTION N/A 07/04/2016   Procedure: CESAREAN SECTION;  Surgeon: [name], DO;  Location: [facility] BIRTHING SUITES;  Service: Obstetrics;  Laterality: N/A;   ENTEROSCOPY N/A 11/08/2017   Procedure: ENTEROSCOPY;  Surgeon: Marina Goodell,  Wilhemina Bonito, MD;  Location: [facility] ENDOSCOPY;  Service: Endoscopy;  Laterality: N/A;   NO PAST SURGERIES         Review of Systems  Constitutional: Negative for appetite change, chills, diaphoresis, fatigue, fever and unexpected weight change.  Eyes:       Negative for acute change in vision  Respiratory: Negative for chest tightness, shortness of breath and wheezing.   Cardiovascular: Negative for chest pain.  Gastrointestinal: Negative for diarrhea, nausea and vomiting.  Genitourinary: Negative for dysuria, pelvic pain and vaginal discharge.  Musculoskeletal: Negative for neck pain and neck  stiffness.  Skin: Negative for rash.  Neurological: Negative for seizures, syncope, weakness and headaches.  Hematological: Negative for adenopathy. Does not bruise/bleed easily.  Psychiatric/Behavioral: Negative for hallucinations.      Objective:    BP (!) 144/96    Pulse (!) 106    Temp 98 F (36.7 C) (Oral)    Wt 149 lb 3.2 oz (67.7 kg)    LMP 12/21/2019 (Approximate)    BMI 23.02 kg/m  Nursing note and vital signs reviewed.  Physical Exam Constitutional:      General: She is not in acute distress.    Appearance: She is well-developed.  Eyes:     Conjunctiva/sclera: Conjunctivae normal.  Cardiovascular:     Rate and Rhythm: Normal rate and regular rhythm.     Heart sounds: Normal heart sounds. No murmur heard.  No friction rub. No gallop.   Pulmonary:     Effort: Pulmonary effort is normal. No respiratory distress.     Breath sounds: Normal breath sounds. No wheezing or rales.  Chest:     Chest wall: No tenderness.  Abdominal:     General: Bowel sounds are normal.     Palpations: Abdomen is soft.     Tenderness: There is no abdominal tenderness.  Musculoskeletal:     Cervical back: Neck supple.  Lymphadenopathy:     Cervical: No cervical adenopathy.  Skin:    General: Skin is warm and dry.     Findings: No rash.  Neurological:     Mental Status: She is alert and oriented to person, place, and time.  Psychiatric:        Behavior: Behavior normal.        Thought Content: Thought content normal.        Judgment: Judgment normal.      Depression screen [facility] 2/9 12/04/2019 10/12/2019 08/05/2019 05/30/2017 05/30/2017  Decreased Interest 0 1 0 0 0  Down, Depressed, Hopeless 1 1 1  0 1  PHQ - 2 Score 1 2 1  0 1  Altered sleeping 1 3 - 1 -  Tired, decreased energy 1 1 - 1 -  Change in appetite 0 3 - 1 -  Feeling bad or failure about yourself  3 3 - 1 -  Trouble concentrating 1 3 - 1 -  Moving slowly or fidgety/restless 0 - - 1 -  Suicidal thoughts 0 0 - 0 -  PHQ-9 Score  7 15 - 6 -  Difficult doing work/chores Very difficult - - Somewhat difficult -  Some recent data might be hidden       Assessment & Plan:    Patient Active Problem List   Diagnosis Date Noted   Elbow pain, right 12/04/2019   Housing problems 11/23/2019   Mental health disorder 10/12/2019   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   Paresthesias 10/12/2019   Healthcare maintenance 08/05/2019   HIV (human  immunodeficiency virus infection) (HCC) 12/31/2018   Anxiety 05/29/2017   Self-mutilation 05/29/2017   Depression 06/07/2016   Marijuana use 06/07/2016   Low vitamin D level 03/15/2016   Prediabetes 02/22/2016   Asthma 02/22/2016   ADD (attention deficit disorder) 02/22/2016   Methamphetamine use disorder, severe, 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/22/2016   Hyperlipidemia with target LDL less than 70 01/31/2012   Migraine 01/31/2012   Tremor, 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     Problem List Items Addressed This Visit      Other   HIV (human immunodeficiency virus infection) (HCC) - Primary (Chronic)    Ms. Remlinger appears to have good adherence and tolerance to her ART regimen of Biktarvy.  No signs/symptoms of opportunistic infection or progressive HIV disease.  Reviewed previous lab work and discussed plan of care.  Continue current dose of Biktarvy.  Check blood work today.  Plan for follow-up in 1 month or sooner if needed.      Relevant Medications   bictegravir-emtricitabine-tenofovir AF (BIKTARVY) 50-200-25 MG TABS tablet   Other Relevant Orders   COMPLETE METABOLIC PANEL WITH GFR   HIV-1 RNA quant-no reflex-bld   T-helper cell (CD4)- ([facility] clinic only)   Methamphetamine use disorder, severe, dependence (HCC)    Ms. Horkey continues to use methamphetamine likely the result of her increasing blood pressure.  Recommended counseling for which she has an appointment here in our office.  Continue to monitor.      Healthcare maintenance     Discussed importance of safe sexual  practice to reduce risk of STI.  Condoms provided.  Due for routine cervical cancer screening through Pap smear.  Recommend making appointment with Pap clinic.  Due for routine dental care with referral placed to [facility].   Declines Covid vaccine today.  Discussed possibly restarting birth control and will follow up with primary care.       Other Visit Diagnoses    Screening for STDs (sexually transmitted diseases)       Relevant Orders   [facility]   Cytology (oral, anal, urethral) ancillary only       I am having Alvan Dame. [place] maintain her ibuprofen, mupirocin ointment, hydrocortisone cream, omeprazole, cetirizine, diclofenac Sodium, bictegravir-emtricitabine-tenofovir AF, and albuterol.   Meds ordered this encounter  Medications   bictegravir-emtricitabine-tenofovir AF (BIKTARVY) 50-200-25 MG TABS tablet    Sig: Take 1 tablet by mouth daily.    Dispense:  30 tablet    Refill:  3    Order Specific Question:   Supervising Provider    Answer:   Drue Second, CYNTHIA [[id number]]   albuterol (PROAIR HFA) 108 (90 Base) MCG/ACT inhaler    Sig: Inhale 1 puff into the lungs every 4 (four) hours as needed.    Dispense:  18 g    Refill:  2    Order Specific Question:   Supervising Provider    Answer:   Judyann Munson [[id number]]     Follow-up: Return in about 1 month (around 02/20/2020), or if symptoms worsen or fail to improve.   Marcos Eke, MSN, FNP-C Nurse Practitioner [facility] for Infectious Disease [facility] Main number: [phone]

## 2020-01-21 NOTE — Assessment & Plan Note (Signed)
   Discussed importance of safe sexual practice to reduce risk of STI.  Condoms provided.  Due for routine cervical cancer screening through Pap smear.  Recommend making appointment with Pap clinic.  Due for routine dental care with referral placed to [facility].   Declines Covid vaccine today.  Discussed possibly restarting birth control and will follow up with primary care.

## 2020-01-22 LAB — T-HELPER CELL (CD4) - (RCID CLINIC ONLY)
CD4 % Helper T Cell: 53 % (ref 33–65)
CD4 T Cell Abs: 1093 /uL (ref 400–1790)

## 2020-01-24 LAB — COMPLETE METABOLIC PANEL WITH GFR
AG Ratio: 1.7 (calc) (ref 1.0–2.5)
ALT: 16 U/L (ref 6–29)
AST: 23 U/L (ref 10–30)
Albumin: 4 g/dL (ref 3.6–5.1)
Alkaline phosphatase (APISO): 73 U/L (ref 31–125)
BUN: 17 mg/dL (ref 7–25)
CO2: 26 mmol/L (ref 20–32)
Calcium: 9.2 mg/dL (ref 8.6–10.2)
Chloride: 104 mmol/L (ref 98–110)
Creat: 0.81 mg/dL (ref 0.50–1.10)
GFR, Est African American: 108 mL/min/{1.73_m2} (ref >=60)
GFR, Est Non African American: 93 mL/min/{1.73_m2} (ref >=60)
Globulin: 2.3 g/dL (calc) (ref 1.9–3.7)
Glucose, Bld: 137 mg/dL — ABNORMAL HIGH (ref 65–99)
Potassium: 3.7 mmol/L (ref 3.5–5.3)
Sodium: 138 mmol/L (ref 135–146)
Total Bilirubin: 0.4 mg/dL (ref 0.2–1.2)
Total Protein: 6.3 g/dL (ref 6.1–8.1)

## 2020-01-24 LAB — RPR: RPR Ser Ql: NONREACTIVE

## 2020-01-24 LAB — HIV-1 RNA QUANT-NO REFLEX-BLD
HIV 1 RNA Quant: <20 Copies/mL
HIV-1 RNA Quant, Log: <1.3 Log cps/mL

## 2020-01-25 LAB — CYTOLOGY, (ORAL, ANAL, URETHRAL) ANCILLARY ONLY
Chlamydia: NEGATIVE
Comment: NEGATIVE
Comment: NORMAL
Neisseria Gonorrhea: NEGATIVE

## 2020-01-26 ENCOUNTER — Ambulatory Visit: Payer: Medicaid Other

## 2020-02-18 ENCOUNTER — Ambulatory Visit: Payer: Medicaid Other | Admitting: Family

## 2020-02-19 ENCOUNTER — Encounter: Payer: Self-pay | Admitting: Family Medicine

## 2020-02-19 ENCOUNTER — Ambulatory Visit (INDEPENDENT_AMBULATORY_CARE_PROVIDER_SITE_OTHER): Payer: Medicaid Other | Admitting: Family Medicine

## 2020-02-19 ENCOUNTER — Other Ambulatory Visit: Payer: Self-pay

## 2020-02-19 VITALS — BP 122/70 | HR 86 | Ht 67.0 in | Wt 147.5 lb

## 2020-02-19 DIAGNOSIS — M25552 Pain in left hip: Secondary | ICD-10-CM | POA: Diagnosis not present

## 2020-02-19 DIAGNOSIS — M25551 Pain in right hip: Secondary | ICD-10-CM | POA: Diagnosis not present

## 2020-02-19 DIAGNOSIS — H01024 Squamous blepharitis left upper eyelid: Secondary | ICD-10-CM | POA: Diagnosis not present

## 2020-02-19 DIAGNOSIS — M940 Chondrocostal junction syndrome [Tietze]: Secondary | ICD-10-CM | POA: Diagnosis not present

## 2020-02-19 DIAGNOSIS — H01009 Unspecified blepharitis unspecified eye, unspecified eyelid: Secondary | ICD-10-CM | POA: Insufficient documentation

## 2020-02-19 NOTE — Assessment & Plan Note (Signed)
Apply topical mupirocin twice daily for at least the next week. -7-9 packets of mupirocin ointment provided in clinic

## 2020-02-19 NOTE — Progress Notes (Signed)
SUBJECTIVE:   CHIEF COMPLAINT / HPI:   Blepharitis Ms. Kristin Hart notes that she has been having a swollen eyelid on her left side for roughly the past week.  Started out as a small area of redness on 1 part of the eyelid and seems to have spread over the past days to now encompass her entire eyelid.  She denies any vision changes or eye pain.  Costochondritis She reports 2-3 days of increasing tenderness and discomfort of her left middle chest.  She does not report any trauma or recent injuries.  She denies fever, new shortness of breath, palpitations, nausea, vomiting.  Hip pain, bilateral Per chart review, Ms. Kristin Hart has a history of hip pain going back about 3 years.  She reports that she often has a sharp or achy discomfort sometimes in her buttocks and sometimes coming from the inside of her hips.  Most recently, it has been bothering her on the right side more than the left side.  She reports occasional sharp pain inside of her hips without significant radiation.  She denies any shooting pain down her leg to her foot.  PERTINENT  PMH / PSH: Ongoing methamphetamine use (smokes crack), HIV positive (well-controlled) denies IV drug use  OBJECTIVE:   BP 122/70    Pulse 86    Ht 5\' 7"  (1.702 m)    Wt 147 lb 8 oz (66.9 kg)    LMP 02/17/2020    SpO2 97%    BMI 23.10 kg/m    General: Alert and cooperative and appears to be in no acute distress HEENT: Swollen and mildly erythematous left upper eyelid.  No conjunctival injection.  Extraocular muscles intact.  No pain with eye movement or vision changes. Chest: Mildly tender to palpation along the costochondral junction of the second rib.  Breast exam is normal with tenderness to palpation of the left axilla.  No nodules or lymphatic structures palpated. Pulm: Breathing comfortably on room air.  No respiratory distress. Back/spine: No tenderness with percussion of the thoracic or lumbar spine.  No paraspinal muscle tenderness.  Mild tenderness  to palpation of the upper gluteal region.  No tenderness with palpation of the greater trochanter bilaterally.  Negative straight leg raise bilaterally but significant discomfort from the inside of her hip joints during straight leg raise. Neuro: Cranial nerves grossly intact  ASSESSMENT/PLAN:   Blepharitis Apply topical mupirocin twice daily for at least the next week. -7-9 packets of mupirocin ointment provided in clinic  Costochondritis History and physical exam most consistent with acute costochondritis.  It seems to improve with Motrin.  Physical exam demonstrates this is clearly musculoskeletal and have very low suspicion for any heart related pathology. HIV is well controlled at this time reducing suspicion of atypical infections.  Additionally, physical exam shows no concerning signs of infection, no purulence, no erythema, no drainage.  No evidence lymphadenopathy in axilla bilaterally. -P.o. Motrin for now -Given red flags for when to return to clinic.  Bilateral hip pain Physical exam is consistent with osteoarthritis although that would be very atypical for 39 year old woman.  She is not endorsing any systemic symptoms at this time which would be consistent with infectious etiology.  Think it would be reasonable to have bilateral hip x-rays performed to assess for other bony pathology before settling on a diagnosis of osteoarthritis of the hip.  -follow-up hip x-rays -P.o. Motrin for now   She is encouraged to follow-up in 1 month with her primary care provider.  Mirian Mo, MD [facility]

## 2020-02-19 NOTE — Patient Instructions (Addendum)
Eyelid swelling: I have provided 1 weeks worth of bacitracin.  I recommend that you put a small coat of bacitracin over your eyelid once in the morning and once in the evening for at least the next week.  I think he should do this until you run out of antibiotics I provided.  I expect that this will improve your eyelid swelling.  When possible, try also to apply a light, warm compress to the eyelid.  Chest pain: I think is most likely related to some inflammation of the joints in your chest here and possibly some muscle irritation.  I think there is a very good chance that this will go away with time.  I recommend he take Motrin for her discomfort.  Hip pain: Today we can get some x-rays of your hips to see if there is any evidence of bony injury to your hips because it seems like there is quite a bit of discomfort.

## 2020-02-19 NOTE — Assessment & Plan Note (Signed)
Physical exam is consistent with osteoarthritis although that would be very atypical for 39 year old woman.  She is not endorsing any systemic symptoms at this time which would be consistent with infectious etiology.  Think it would be reasonable to have bilateral hip x-rays performed to assess for other bony pathology before settling on a diagnosis of osteoarthritis of the hip.  -follow-up hip x-rays -P.o. Motrin for now

## 2020-02-19 NOTE — Assessment & Plan Note (Signed)
History and physical exam most consistent with acute costochondritis.  It seems to improve with Motrin.  Physical exam demonstrates this is clearly musculoskeletal and have very low suspicion for any heart related pathology. HIV is well controlled at this time reducing suspicion of atypical infections.  Additionally, physical exam shows no concerning signs of infection, no purulence, no erythema, no drainage.  No evidence lymphadenopathy in axilla bilaterally. -P.o. Motrin for now -Given red flags for when to return to clinic.

## 2020-02-22 ENCOUNTER — Ambulatory Visit
Admission: RE | Admit: 2020-02-22 | Discharge: 2020-02-22 | Disposition: A | Discharge status: Home / Self Care | Payer: Medicaid Other | Source: Ambulatory Visit | Attending: Family Medicine | Admitting: Family Medicine

## 2020-02-22 DIAGNOSIS — M25551 Pain in right hip: Secondary | ICD-10-CM | POA: Diagnosis not present

## 2020-02-22 DIAGNOSIS — M25552 Pain in left hip: Secondary | ICD-10-CM | POA: Diagnosis not present

## 2020-02-22 IMAGING — CR DG HIP (WITH OR WITHOUT PELVIS) 2V BILAT
3 series · 3 of 3 positions shown · non-contrast
Comparison: None.

CLINICAL DATA: Bilateral hip pain

EXAM:
DG HIP (WITH OR WITHOUT PELVIS) 2V BILAT

[t pelvis a.p.]
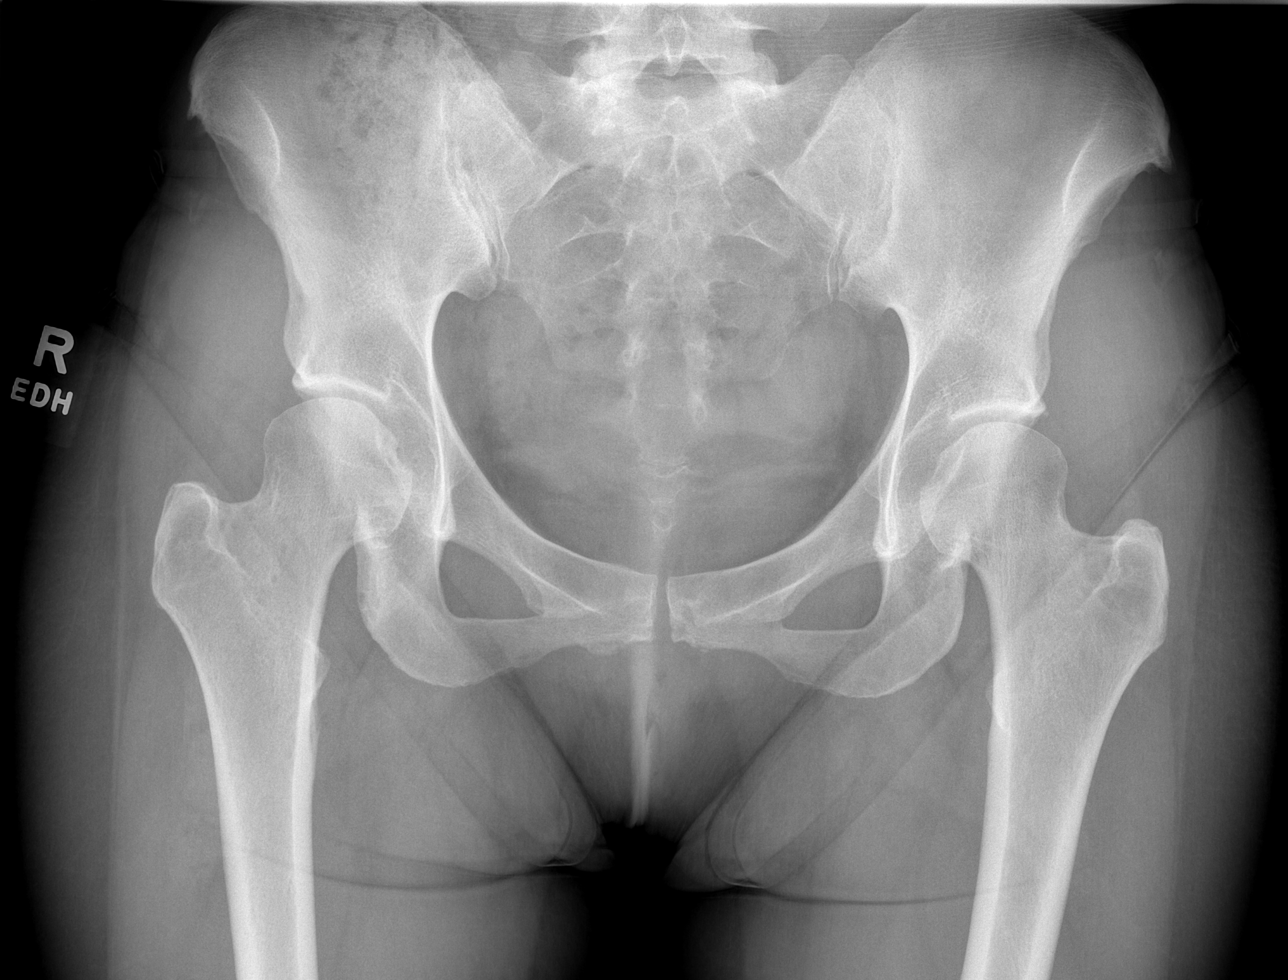

[t hip frog leg left]
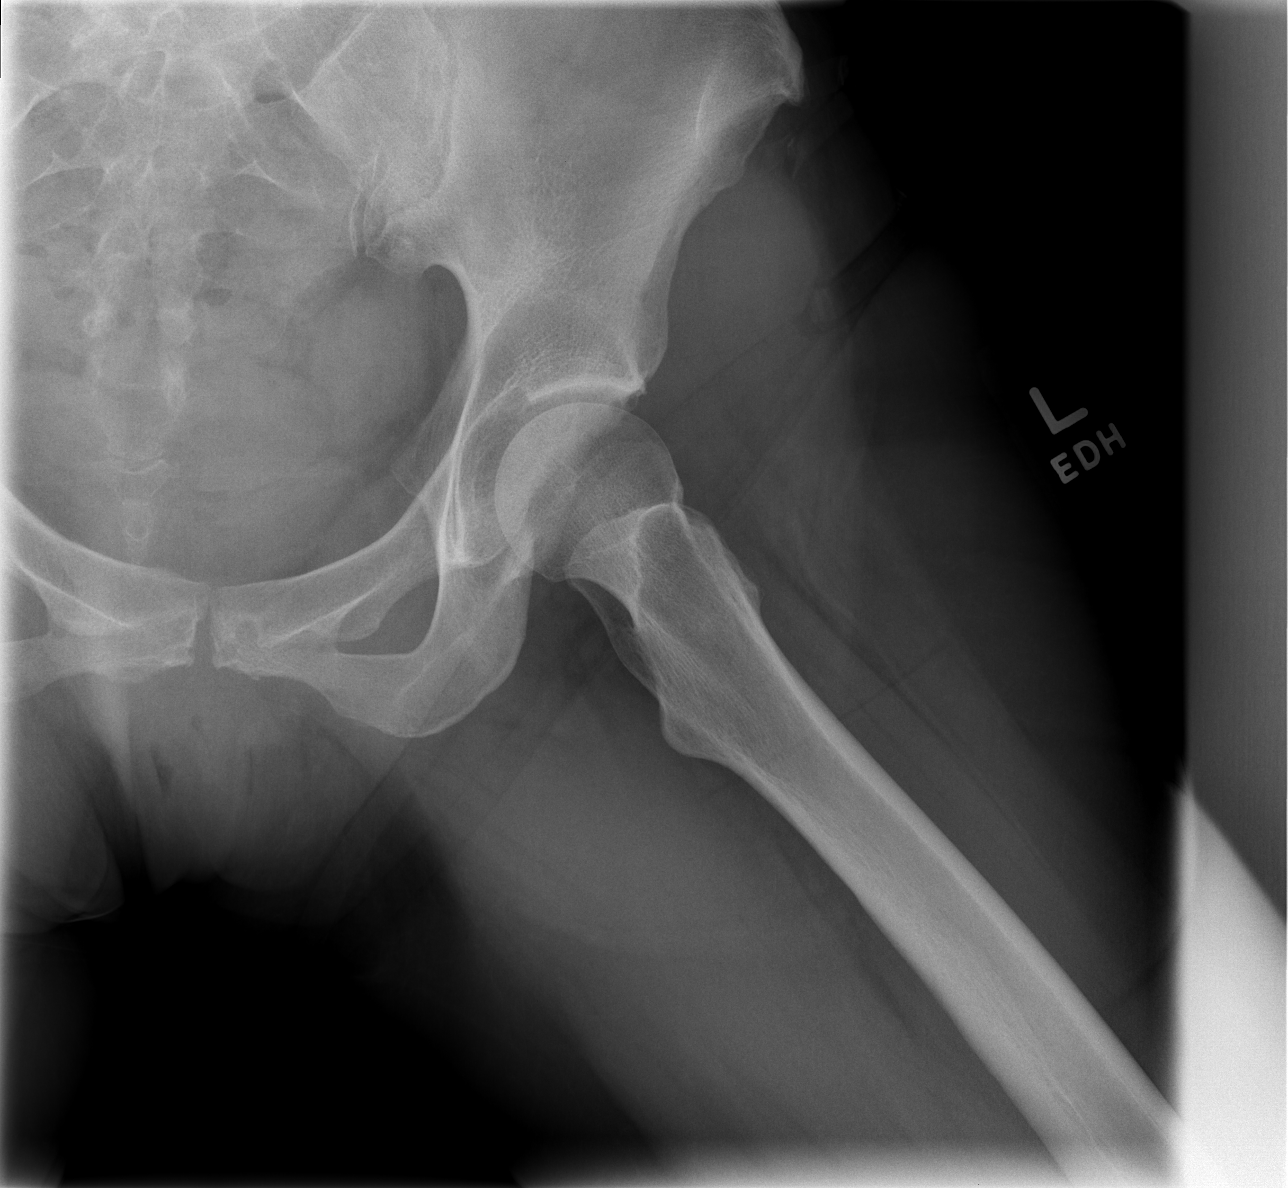

[t hip frog leg right]
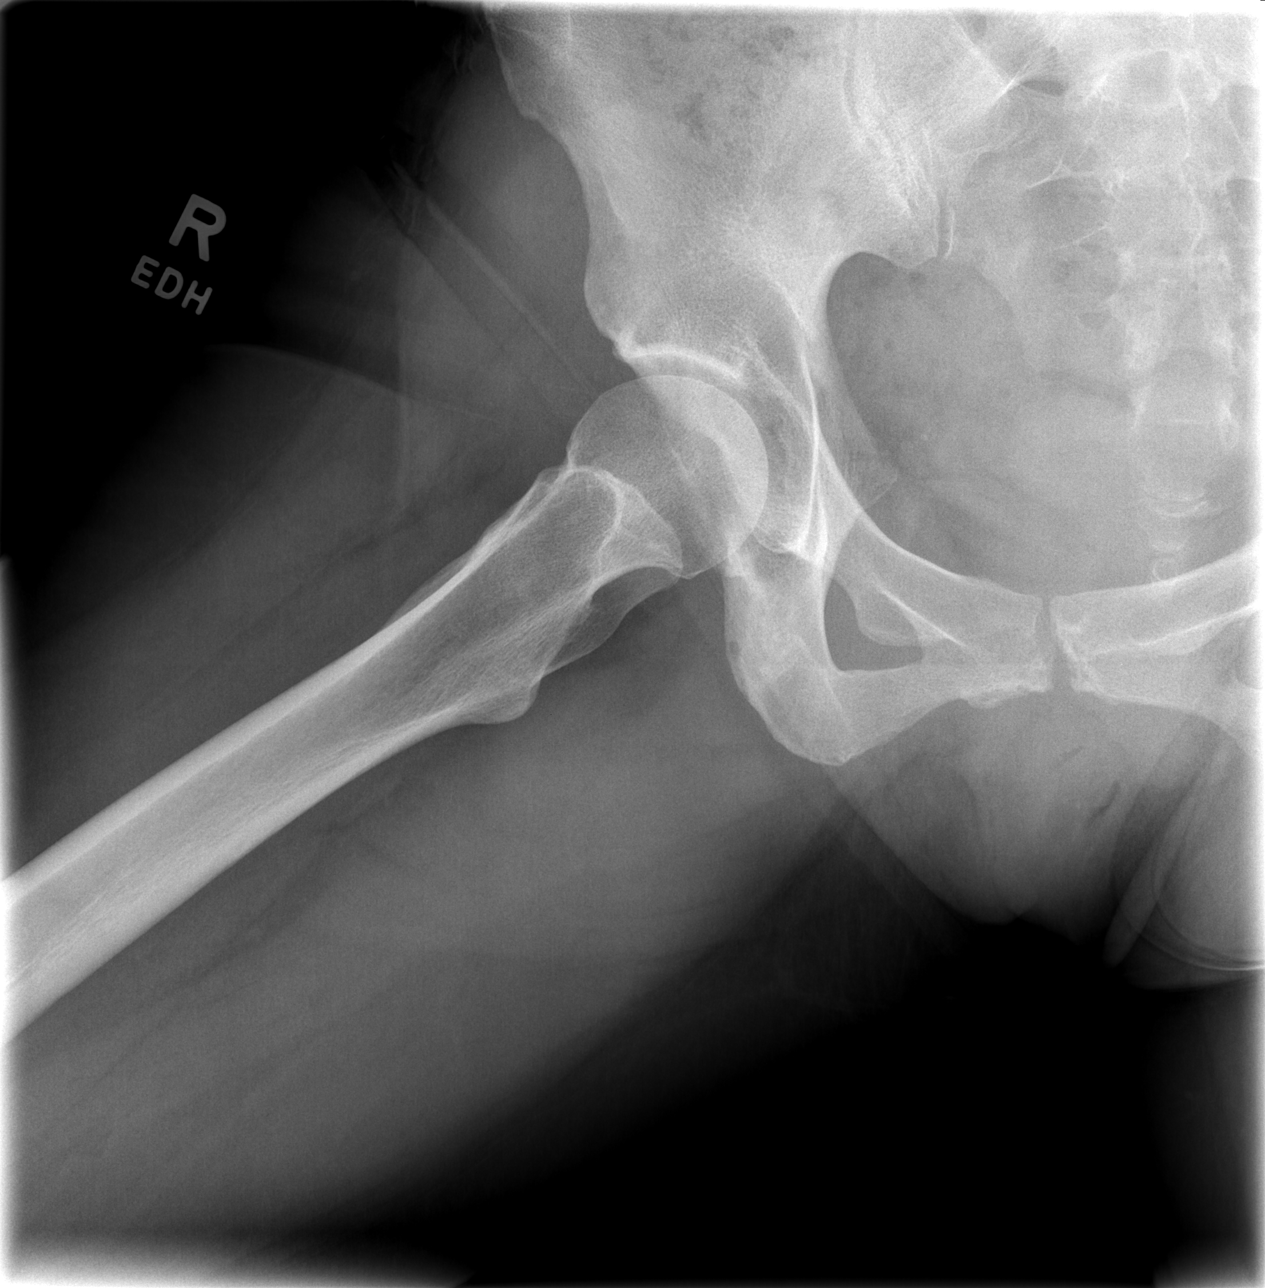

[3 of 3 positions shown; findings below may reference images not displayed]

FINDINGS: Alignment is anatomic. Joint spaces are preserved. No periosteal
reaction. No intrinsic osseous lesion.
IMPRESSION: Negative.

## 2020-03-08 ENCOUNTER — Ambulatory Visit (INDEPENDENT_AMBULATORY_CARE_PROVIDER_SITE_OTHER): Payer: Medicaid Other | Admitting: Family

## 2020-03-08 ENCOUNTER — Other Ambulatory Visit: Payer: Self-pay

## 2020-03-08 ENCOUNTER — Encounter (INDEPENDENT_AMBULATORY_CARE_PROVIDER_SITE_OTHER): Payer: Medicaid Other

## 2020-03-08 ENCOUNTER — Encounter: Payer: Self-pay | Admitting: Family

## 2020-03-08 DIAGNOSIS — Z0000 Encounter for general adult medical examination without abnormal findings: Secondary | ICD-10-CM

## 2020-03-08 DIAGNOSIS — B20 Human immunodeficiency virus [HIV] disease: Secondary | ICD-10-CM

## 2020-03-08 MED ORDER — BICTEGRAVIR-EMTRICITAB-TENOFOV 50-200-25 MG PO TABS: 1.0000 | ORAL_TABLET | Freq: Every day | ORAL | 3 refills | Status: DC

## 2020-03-08 NOTE — Progress Notes (Signed)
Subjective:    Patient ID: Kristin Hart, female    DOB: [birth year], 39 y.o.   MRN: [id number]  Chief Complaint  Patient presents with  . Follow-up    Given condoms     HPI:  Kristin Hart is a 39 y.o. female with HIV disease last seen on 01/21/2020 with good adherence and tolerance to her ART regimen of Biktarvy.  Viral load at the time was undetectable with CD4 count of 1093.  No recent blood work completed.  Here today for routine follow-up.  [name] continues to take her Biktarvy daily as prescribed with no adverse side effects or missed doses since her last office visit.  Overall feeling well although continues to have bilateral hip pain and is concerned about coughing up mucus recently. Denies fevers, chills, night sweats, headaches, changes in vision, neck pain/stiffness, nausea, diarrhea, vomiting, lesions or rashes.  [name] has no problems obtaining her medication from the pharmacy.  Denies feelings of being down, depressed, or hopeless recently.  Continues to use methamphetamine with no alcohol consumption or tobacco use.  She was seen by our counseling service today.  Condoms provided.  She was able to spend additional time with her son recently.   No Known Allergies    Outpatient Medications Prior to Visit  Medication Sig Dispense Refill  . albuterol (PROAIR HFA) 108 (90 Base) MCG/ACT inhaler Inhale 1 puff into the lungs every 4 (four) hours as needed. 18 g 2  . cetirizine (ZYRTEC) 10 MG tablet Take 1 tablet (10 mg total) by mouth daily. 30 tablet 11  . diclofenac Sodium (VOLTAREN) 1 % GEL Apply 2 g topically 4 (four) times daily as needed (for elbow pain). 2 g 3  . hydrocortisone cream 1 % Apply to affected area 2 times daily 15 g 0  . ibuprofen (ADVIL,MOTRIN) 600 MG tablet Take 1 tablet (600 mg total) by mouth every 6 (six) hours as needed. (Patient taking differently: Take 800 mg by mouth every 6 (six) hours as needed.) 30 tablet 0  . omeprazole (PRILOSEC)  40 MG capsule Take 1 capsule (40 mg total) by mouth daily. 30 capsule 1  . bictegravir-emtricitabine-tenofovir AF (BIKTARVY) 50-200-25 MG TABS tablet Take 1 tablet by mouth daily. 30 tablet 3  . mupirocin ointment (BACTROBAN) 2 % APPLY SPARINGLY TO AFFECTED AREA 3 TIMES A DAY FOR 10 DAYS (Patient not taking: Reported on 03/08/2020)  0   No facility-administered medications prior to visit.     Past Medical History:  Diagnosis Date  . Asthma   . 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 Diabetes mellitus without complication (HCC)    type 2  . Foreign body in small intestine   . HIV (human immunodeficiency virus infection) (HCC)   . Hypothyroidism   . Stomach ulcer      Past Surgical History:  Procedure Laterality Date  . CESAREAN SECTION N/A 07/04/2016   Procedure: CESAREAN SECTION;  Surgeon: [name], DO;  Location: [facility] BIRTHING SUITES;  Service: Obstetrics;  Laterality: N/A;  . ENTEROSCOPY N/A 11/08/2017   Procedure: ENTEROSCOPY;  Surgeon: Hilarie Fredrickson, MD;  Location: [facility] ENDOSCOPY;  Service: Endoscopy;  Laterality: N/A;  . NO PAST SURGERIES         Review of Systems  Constitutional: Negative for appetite change, chills, diaphoresis, fatigue, fever and unexpected weight change.  Eyes:       Negative for acute change in vision  Respiratory: Negative for chest tightness, shortness of breath and wheezing.   Cardiovascular: Negative for chest pain.  Gastrointestinal: Negative for diarrhea, nausea and vomiting.  Genitourinary: Negative for dysuria, pelvic pain and vaginal discharge.  Musculoskeletal: Negative for neck pain and neck stiffness.  Skin: Negative for rash.  Neurological: Negative for seizures, syncope, weakness and headaches.  Hematological: Negative for adenopathy. Does not bruise/bleed easily.  Psychiatric/Behavioral: Negative for hallucinations.      Objective:    BP 135/85   Pulse 85   Temp 98.3 F (36.8 C)  (Oral)   Ht 5\' 7"  (1.702 m)   Wt 152 lb (68.9 kg)   LMP 02/17/2020   SpO2 100%   BMI 23.81 kg/m  Nursing note and vital signs reviewed.  Physical Exam Constitutional:      General: She is not in acute distress.    Appearance: She is well-developed.  HENT:     Mouth/Throat:     Mouth: Oropharynx is clear and moist.  Eyes:     Conjunctiva/sclera: Conjunctivae normal.  Cardiovascular:     Rate and Rhythm: Normal rate and regular rhythm.     Pulses: Intact distal pulses.     Heart sounds: Normal heart sounds. No murmur heard. No friction rub. No gallop.   Pulmonary:     Effort: Pulmonary effort is normal. No respiratory distress.     Breath sounds: Normal breath sounds. No wheezing or rales.  Chest:     Chest wall: No tenderness.  Abdominal:     General: Bowel sounds are normal.     Palpations: Abdomen is soft.     Tenderness: There is no abdominal tenderness.  Musculoskeletal:     Cervical back: Neck supple.  Lymphadenopathy:     Cervical: No cervical adenopathy.  Skin:    General: Skin is warm and dry.     Findings: No rash.  Neurological:     Mental Status: She is alert and oriented to person, place, and time.  Psychiatric:        Mood and Affect: Mood and affect normal.        Behavior: Behavior normal.        Thought Content: Thought content normal.        Judgment: Judgment normal.      Depression screen [facility] 2/9 03/08/2020 02/19/2020 12/04/2019 10/12/2019 08/05/2019  Decreased Interest 0 0 0 1 0  Down, Depressed, Hopeless 0 1 1 1 1   PHQ - 2 Score 0 1 1 2 1   Altered sleeping - 1 1 3  -  Tired, decreased energy - 1 1 1  -  Change in appetite - 0 0 3 -  Feeling bad or failure about yourself  - 1 3 3  -  Trouble concentrating - 1 1 3  -  Moving slowly or fidgety/restless - 0 0 - -  Suicidal thoughts - 0 0 0 -  PHQ-9 Score - 5 7 15  -  Difficult doing work/chores - Not difficult at all Very difficult - -  Some recent data might be hidden       Assessment & Plan:     Patient Active Problem List   Diagnosis Date Noted  . Blepharitis 02/19/2020  . Costochondritis 02/19/2020  . Bilateral hip pain 02/19/2020  . Elbow pain, right 12/04/2019  . Housing problems 11/23/2019  . Mental health disorder 10/12/2019  . 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  . Paresthesias 10/12/2019  . Healthcare maintenance 08/05/2019  . HIV (human immunodeficiency virus infection) (  HCC) 12/31/2018  . Anxiety 05/29/2017  . Self-mutilation 05/29/2017  . Depression 06/07/2016  . Marijuana use 06/07/2016  . Low vitamin D level 03/15/2016  . Prediabetes 02/22/2016  . Asthma 02/22/2016  . ADD (attention deficit disorder) 02/22/2016  . Methamphetamine use disorder, severe, 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/22/2016  . Hyperlipidemia with target LDL less than 70 01/31/2012  . Migraine 01/31/2012  . Tremor, 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     Problem List Items Addressed This Visit      Other   HIV (human immunodeficiency virus infection) (HCC) (Chronic)    Ms. Benedicto continues have well-controlled HIV disease with good adherence and tolerance to her ART regimen of Biktarvy.  No signs/symptoms of opportunistic infection or progressive HIV disease.  We reviewed previous lab work and discussed plan of care.  Continue current dose of Biktarvy.  Will need to renew financial assistance after January 1.  Plan for follow-up in 1 month or sooner if needed.      Healthcare maintenance     Discussed importance of safe sexual practice to reduce risk of STI.  Condoms provided.  Declines Prevnar and Pneumovax today.  Discussed/encourage Covid vaccines and to restart series of either Pfizer or Moderna  Cervical cancer screening is up to date.          I am having Alvan Dame. Cardozo maintain her ibuprofen, mupirocin ointment, hydrocortisone cream, omeprazole, cetirizine, diclofenac Sodium, and albuterol.   Follow-up: Return in about 1 month (around 04/08/2020).   Marcos Eke, MSN, FNP-C Nurse  Practitioner [facility] for Infectious Disease [facility] Main number: [phone]

## 2020-03-08 NOTE — Assessment & Plan Note (Signed)
Kristin Hart continues have well-controlled HIV disease with good adherence and tolerance to her ART regimen of Biktarvy.  No signs/symptoms of opportunistic infection or progressive HIV disease.  We reviewed previous lab work and discussed plan of care.  Continue current dose of Biktarvy.  Will need to renew financial assistance after January 1.  Plan for follow-up in 1 month or sooner if needed.

## 2020-03-08 NOTE — Patient Instructions (Signed)
Nice to see you.  Continue current Biktarvy.  Refills have been sent to the pharmacy.  Recommend COVID vaccine series - Moderna or Pfizer  Recommend Prevnar and Pneumovax for pneumonia vaccine  Continue with ice/heat and icy hot as needed. Work with primary care for any additional treatments as needed.   Plan for follow up in 1 month or sooner if needed with lab work on same day.  Have a great day and stay safe!

## 2020-03-08 NOTE — Assessment & Plan Note (Signed)
·   Discussed importance of safe sexual practice to reduce risk of STI.  Condoms provided.  Declines Prevnar and Pneumovax today.  Discussed/encourage Covid vaccines and to restart series of either Pfizer or Moderna  Cervical cancer screening is up to date.

## 2020-03-08 NOTE — Patient Instructions (Addendum)
Nice to see you.  We will continue with your current Biktarvy.  Refills have been sent to the pharmacy.  Recommend COVID vaccine when you are able - restart Moderna or [employer] series  Consider Prevnar and Pneumovax for pneumonia vaccines.   Continue with ice/heat/icy hot as needed for hip pains.  Plan for follow up in 1 month or sooner if needed.   Have a great day and stay safe!  Happy New Year!

## 2020-03-08 NOTE — Progress Notes (Deleted)
Subjective:    Patient ID: Kristin Hart, female    DOB: 02-Mar-1981, 39 y.o.   MRN: [id number]  No chief complaint on file.    HPI:  Kristin Hart is a 39 y.o. female    Continues to have pain in her hips and legs that is refractory to lidocaine and ice. Usually ibuprofen and aleve   Wt Readings from Last 3 Encounters:  02/19/20 147 lb 8 oz (66.9 kg)  01/21/20 149 lb 3.2 oz (67.7 kg)  12/24/19 143 lb (64.9 kg)      No Known Allergies    Outpatient Medications Prior to Visit  Medication Sig Dispense Refill  . albuterol (PROAIR HFA) 108 (90 Base) MCG/ACT inhaler Inhale 1 puff into the lungs every 4 (four) hours as needed. 18 g 2  . bictegravir-emtricitabine-tenofovir AF (BIKTARVY) 50-200-25 MG TABS tablet Take 1 tablet by mouth daily. 30 tablet 3  . cetirizine (ZYRTEC) 10 MG tablet Take 1 tablet (10 mg total) by mouth daily. 30 tablet 11  . diclofenac Sodium (VOLTAREN) 1 % GEL Apply 2 g topically 4 (four) times daily as needed (for elbow pain). 2 g 3  . hydrocortisone cream 1 % Apply to affected area 2 times daily 15 g 0  . ibuprofen (ADVIL,MOTRIN) 600 MG tablet Take 1 tablet (600 mg total) by mouth every 6 (six) hours as needed. (Patient taking differently: Take 800 mg by mouth every 6 (six) hours as needed.) 30 tablet 0  . mupirocin ointment (BACTROBAN) 2 % APPLY SPARINGLY TO AFFECTED AREA 3 TIMES A DAY FOR 10 DAYS (Patient not taking: Reported on 03/08/2020)  0  . omeprazole (PRILOSEC) 40 MG capsule Take 1 capsule (40 mg total) by mouth daily. 30 capsule 1   No facility-administered medications prior to visit.     Past Medical History:  Diagnosis Date  . Asthma   . 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 Diabetes mellitus without complication (HCC)    type 2  . Foreign body in small intestine   . HIV (human immunodeficiency virus infection) (HCC)   . Hypothyroidism   . Stomach ulcer      Past Surgical History:   Procedure Laterality Date  . CESAREAN SECTION N/A 07/04/2016   Procedure: CESAREAN SECTION;  Surgeon: [name], DO;  Location: [facility] BIRTHING SUITES;  Service: Obstetrics;  Laterality: N/A;  . ENTEROSCOPY N/A 11/08/2017   Procedure: ENTEROSCOPY;  Surgeon: Hilarie Fredrickson, MD;  Location: [facility] ENDOSCOPY;  Service: Endoscopy;  Laterality: N/A;  . NO PAST SURGERIES         Review of Systems    Objective:    LMP 02/17/2020  Nursing note and vital signs reviewed.  Physical Exam   Depression screen [facility] 2/9 03/08/2020 02/19/2020 12/04/2019 10/12/2019 08/05/2019  Decreased Interest 0 0 0 1 0  Down, Depressed, Hopeless 0 1 1 1 1   PHQ - 2 Score 0 1 1 2 1   Altered sleeping - 1 1 3  -  Tired, decreased energy - 1 1 1  -  Change in appetite - 0 0 3 -  Feeling bad or failure about yourself  - 1 3 3  -  Trouble concentrating - 1 1 3  -  Moving slowly or fidgety/restless - 0 0 - -  Suicidal thoughts - 0 0 0 -  PHQ-9 Score - 5 7 15  -  Difficult doing work/chores - Not difficult at all Very difficult - -  Some recent data might be hidden       Assessment & Plan:    Patient Active Problem List   Diagnosis Date Noted  . Blepharitis 02/19/2020  . Costochondritis 02/19/2020  . Bilateral hip pain 02/19/2020  . Elbow pain, right 12/04/2019  . Housing problems 11/23/2019  . Mental health disorder 10/12/2019  . 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  . Paresthesias 10/12/2019  . HIV (human immunodeficiency virus infection) (HCC) 12/31/2018  . Anxiety 05/29/2017  . Self-mutilation 05/29/2017  . Depression 06/07/2016  . Marijuana use 06/07/2016  . Low vitamin D level 03/15/2016  . Prediabetes 02/22/2016  . Asthma 02/22/2016  . ADD (attention deficit disorder) 02/22/2016  . Methamphetamine use disorder, severe, 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/22/2016  . Hyperlipidemia with target LDL less than 70 01/31/2012  . Migraine 01/31/2012  . Tremor, 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     Problem List Items Addressed This  Visit   None      I am having Alvan Dame. Love maintain her ibuprofen, mupirocin ointment, hydrocortisone cream, omeprazole, cetirizine, diclofenac Sodium, bictegravir-emtricitabine-tenofovir AF, and albuterol.   No orders of the defined types were placed in this encounter.    Follow-up: No follow-ups on file.   Marcos Eke, MSN, FNP-C Nurse Practitioner [facility] for Infectious Disease [facility] Main number: [phone]

## 2020-03-09 NOTE — Progress Notes (Signed)
Error

## 2020-04-13 ENCOUNTER — Encounter: Payer: Self-pay | Admitting: Family Medicine

## 2020-04-13 ENCOUNTER — Ambulatory Visit (INDEPENDENT_AMBULATORY_CARE_PROVIDER_SITE_OTHER): Payer: Medicaid Other | Admitting: Family Medicine

## 2020-04-13 ENCOUNTER — Other Ambulatory Visit: Payer: Self-pay

## 2020-04-13 VITALS — BP 150/90 | HR 118 | Wt 152.0 lb

## 2020-04-13 DIAGNOSIS — M25551 Pain in right hip: Secondary | ICD-10-CM

## 2020-04-13 DIAGNOSIS — G43009 Migraine without aura, not intractable, without status migrainosus: Secondary | ICD-10-CM

## 2020-04-13 DIAGNOSIS — M25552 Pain in left hip: Secondary | ICD-10-CM

## 2020-04-13 DIAGNOSIS — M25521 Pain in right elbow: Secondary | ICD-10-CM | POA: Diagnosis not present

## 2020-04-13 MED ORDER — DICLOFENAC SODIUM 1 % EX GEL: 2.0000 g | Freq: Four times a day (QID) | CUTANEOUS | 3 refills | Status: DC | PRN

## 2020-04-13 MED ORDER — TOPIRAMATE 25 MG PO TABS: 25.0000 mg | ORAL_TABLET | Freq: Two times a day (BID) | ORAL | 0 refills | Status: DC

## 2020-04-13 MED ORDER — NAPROXEN 500 MG PO TABS: 500.0000 mg | ORAL_TABLET | Freq: Two times a day (BID) | ORAL | 0 refills | Status: DC
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
## 2020-04-13 NOTE — Patient Instructions (Signed)
It was so great seeing you! Today we discussed the following:  Migraines: I am prescribing topiramate for you to take daily, this is a medication to try and prevent migraines from occurring. You will start off taking 1 25mg  pill daily for 7 days, then twice daily for 7 days, followed by one tablet in the morning and 2 at night for 7 days and then 2 tablets twice daily. I want you to follow up in the next 2-3 weeks to ensure that you are doing well with this medication.  Hip pain: I have sent in a prescription for naproxen and the Voltaren gel. I have also sent in a referral for physical therapy.

## 2020-04-14 ENCOUNTER — Ambulatory Visit (INDEPENDENT_AMBULATORY_CARE_PROVIDER_SITE_OTHER): Payer: Medicaid Other | Admitting: Family

## 2020-04-14 ENCOUNTER — Encounter: Payer: Self-pay | Admitting: Family

## 2020-04-14 ENCOUNTER — Other Ambulatory Visit (HOSPITAL_COMMUNITY)
Admission: RE | Admit: 2020-04-14 | Discharge: 2020-04-14 | Disposition: A | Discharge status: Home / Self Care | Payer: Medicaid Other | Source: Ambulatory Visit | Attending: Family | Admitting: Family

## 2020-04-14 ENCOUNTER — Ambulatory Visit: Payer: Medicaid Other

## 2020-04-14 VITALS — BP 147/96 | HR 96 | Temp 98.4°F | Wt 153.0 lb

## 2020-04-14 DIAGNOSIS — B20 Human immunodeficiency virus [HIV] disease: Secondary | ICD-10-CM | POA: Diagnosis not present

## 2020-04-14 DIAGNOSIS — Z30013 Encounter for initial prescription of injectable contraceptive: Secondary | ICD-10-CM | POA: Diagnosis not present

## 2020-04-14 DIAGNOSIS — F32A Depression, unspecified: Secondary | ICD-10-CM

## 2020-04-14 DIAGNOSIS — Z113 Encounter for screening for infections with a predominantly sexual mode of transmission: Secondary | ICD-10-CM | POA: Diagnosis not present

## 2020-04-14 DIAGNOSIS — Z0000 Encounter for general adult medical examination without abnormal findings: Secondary | ICD-10-CM

## 2020-04-14 LAB — POCT URINE PREGNANCY: Preg Test, Ur: NEGATIVE

## 2020-04-14 MED ORDER — BICTEGRAVIR-EMTRICITAB-TENOFOV 50-200-25 MG PO TABS: 1.0000 | ORAL_TABLET | Freq: Every day | ORAL | 3 refills | Status: DC

## 2020-04-14 NOTE — Assessment & Plan Note (Signed)
Kristin Hart continues to have well-controlled HIV disease with good adherence and tolerance to her ART regimen of Biktarvy.  No signs/symptoms of opportunistic infection or progressive HIV disease.  We reviewed lab work and discussed plan of care.  Continue current dose of Biktarvy.  Check blood work today.  Plan for follow-up in 1 month or sooner if needed.

## 2020-04-14 NOTE — Assessment & Plan Note (Signed)
   Discussed importance of safe sexual practice to reduce risk of STI.  Condoms provided.  Started on Depo-Provera for birth control which she was previously on.  Point-of-care pregnancy test negative.  Due for routine dental care with referral placed to [facility] dental clinic  Recommend follow-up for cervical cancer screening with gynecology or [facility] clinic.

## 2020-04-14 NOTE — Assessment & Plan Note (Signed)
Depression is stable with no suicidal ideations or signs of psychosis.  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  Continue to monitor.

## 2020-04-14 NOTE — Progress Notes (Signed)
Subjective:    Patient ID: Kristin Hart, female    DOB: 05-May-1980, 41 y.o.   MRN: [id number]  Chief Complaint  Patient presents with  . Follow-up    B20     HPI:  Kristin Hart is a 40 y.o. female with HIV disease last seen on 03/08/20 with good adherence and tolerance to her ART regimen of Biktarvy and well controlled virus with viral load being undetectable and CD4 count of 1093. Here today for routine follow up.   Kristin Hart continues to take her Biktarvy daily as prescribed with no adverse side effects or missed doses since her last office visit.  Overall feeling well today. Denies fevers, chills, night sweats, headaches, changes in vision, neck pain/stiffness, nausea, diarrhea, vomiting, lesions or rashes.  Kristin Hart has no problems obtaining medication from the pharmacy.  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  Using methamphetamine and marijuana a couple times per month.  No tobacco use.  Condoms currently dictation provided.  Declines vaccines today.  Interested in starting on Depo-Provera for birth control.   No Known Allergies    Outpatient Medications Prior to Visit  Medication Sig Dispense Refill  . albuterol (PROAIR HFA) 108 (90 Base) MCG/ACT inhaler Inhale 1 puff into the lungs every 4 (four) hours as needed. 18 g 2  . cetirizine (ZYRTEC) 10 MG tablet Take 1 tablet (10 mg total) by mouth daily. 30 tablet 11  . diclofenac Sodium (VOLTAREN) 1 % GEL Apply 2 g topically 4 (four) times daily as needed (for elbow pain). As needed for hip pain. 2 g 3  . hydrocortisone cream 1 % Apply to affected area 2 times daily 15 g 0  . ibuprofen (ADVIL,MOTRIN) 600 MG tablet Take 1 tablet (600 mg total) by mouth every 6 (six) hours as needed. (Patient taking differently: Take 800 mg by mouth every 6 (six) hours as needed.) 30 tablet 0  . mupirocin ointment (BACTROBAN) 2 %   0  . naproxen (NAPROSYN) 500 MG tablet Take 1  tablet (500 mg total) by mouth 2 (two) times daily with a meal. 30 tablet 0  . omeprazole (PRILOSEC) 40 MG capsule Take 1 capsule (40 mg total) by mouth daily. 30 capsule 1  . topiramate (TOPAMAX) 25 MG tablet Take 1 tablet (25 mg total) by mouth 2 (two) times daily. Take 25 mg orally once daily in the evening for first week, 25 mg twice daily for second week, 25 mg in the morning and 50 mg in the evening for third week, and then 50 mg twice daily 70 tablet 0  . bictegravir-emtricitabine-tenofovir AF (BIKTARVY) 50-200-25 MG TABS tablet Take 1 tablet by mouth daily.     No facility-administered medications prior to visit.     Past Medical History:  Diagnosis Date  . Asthma   . 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 Diabetes mellitus without complication ([place])    type 2  . Foreign body in small intestine   . HIV (human immunodeficiency virus infection) ([place])   . Hypothyroidism   . Stomach ulcer      Past Surgical History:  Procedure Laterality Date  . CESAREAN SECTION N/A 07/04/2016   Procedure: CESAREAN SECTION;  Surgeon: Truett Mainland, DO;  Location: [facility];  Service: Obstetrics;  Laterality: N/A;  . ENTEROSCOPY N/A 11/08/2017   Procedure: ENTEROSCOPY;  Surgeon: Irene Shipper, MD;  Location: [facility];  Service: Endoscopy;  Laterality: N/A;  . NO PAST SURGERIES         Review of Systems  Constitutional: Negative for appetite change, chills, diaphoresis, fatigue, fever and unexpected weight change.  Eyes:       Negative for acute change in vision  Respiratory: Negative for chest tightness, shortness of breath and wheezing.   Cardiovascular: Negative for chest pain.  Gastrointestinal: Negative for diarrhea, nausea and vomiting.  Genitourinary: Negative for dysuria, pelvic pain and vaginal discharge.  Musculoskeletal: Negative for neck pain and neck stiffness.  Skin: Negative for rash.  Neurological: Negative for  seizures, syncope, weakness and headaches.  Hematological: Negative for adenopathy. Does not bruise/bleed easily.  Psychiatric/Behavioral: Negative for hallucinations.      Objective:    BP (!) 147/96   Pulse 96   Temp 98.4 F (36.9 C) (Oral)   Wt 153 lb (69.4 kg)   LMP 04/13/2020   BMI 23.96 kg/m  Nursing note and vital signs reviewed.  Physical Exam Constitutional:      General: She is not in acute distress.    Appearance: She is well-developed.  HENT:     Mouth/Throat:     Mouth: Oropharynx is clear and moist.  Eyes:     Conjunctiva/sclera: Conjunctivae normal.  Cardiovascular:     Rate and Rhythm: Normal rate and regular rhythm.     Pulses: Intact distal pulses.     Heart sounds: Normal heart sounds. No murmur heard. No friction rub. No gallop.   Pulmonary:     Effort: Pulmonary effort is normal. No respiratory distress.     Breath sounds: Normal breath sounds. No wheezing or rales.  Chest:     Chest wall: No tenderness.  Abdominal:     General: Bowel sounds are normal.     Palpations: Abdomen is soft.     Tenderness: There is no abdominal tenderness.  Musculoskeletal:     Cervical back: Neck supple.  Lymphadenopathy:     Cervical: No cervical adenopathy.  Skin:    General: Skin is warm and dry.     Findings: No rash.  Neurological:     Mental Status: She is alert and oriented to person, place, and time.  Psychiatric:        Mood and Affect: Mood and affect normal.        Behavior: Behavior normal.        Thought Content: Thought content normal.        Judgment: Judgment normal.      Depression screen [facility] 2/9 04/13/2020 03/08/2020 02/19/2020 12/04/2019 10/12/2019  Decreased Interest 1 0 0 0 1  Down, Depressed, Hopeless 1 0 1 1 1   PHQ - 2 Score 2 0 1 1 2   Altered sleeping 1 - 1 1 3   Tired, decreased energy 1 - 1 1 1   Change in appetite 1 - 0 0 3  Feeling bad or failure about yourself  2 - 1 3 3   Trouble concentrating 2 - 1 1 3   Moving slowly or  fidgety/restless 0 - 0 0 -  Suicidal thoughts 0 - 0 0 0  PHQ-9 Score 9 - 5 7 15   Difficult doing work/chores - - Not difficult at all Very difficult -  Some recent data might be hidden       Assessment & Plan:    Patient Active Problem List   Diagnosis Date Noted  . Costochondritis 02/19/2020  . Bilateral hip pain 02/19/2020  .  Elbow pain, right 12/04/2019  . Housing problems 11/23/2019  . Mental health disorder 10/12/2019  . 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  . Paresthesias 10/12/2019  . Healthcare maintenance 08/05/2019  . HIV (human immunodeficiency virus infection) ([place]) 12/31/2018  . Anxiety 05/29/2017  . Self-mutilation 05/29/2017  . Depression 06/07/2016  . Marijuana use 06/07/2016  . Low vitamin D level 03/15/2016  . Prediabetes 02/22/2016  . Asthma 02/22/2016  . ADD (attention deficit disorder) 02/22/2016  . Methamphetamine use disorder, severe, dependence ([place]) 02/22/2016  . Hyperlipidemia with target LDL less than 70 01/31/2012  . Migraine 01/31/2012  . Tremor, 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     Problem List Items Addressed This Visit      Other   HIV (human immunodeficiency virus infection) ([place]) - Primary (Chronic)    Kristin Hart continues to have well-controlled HIV disease with good adherence and tolerance to her ART regimen of Biktarvy.  No signs/symptoms of opportunistic infection or progressive HIV disease.  We reviewed lab work and discussed plan of care.  Continue current dose of Biktarvy.  Check blood work today.  Plan for follow-up in 1 month or sooner if needed.      Relevant Medications   bictegravir-emtricitabine-tenofovir AF (BIKTARVY) 50-200-25 MG TABS tablet   Other Relevant Orders   HIV-1 RNA quant-no reflex-bld   T-helper cell (CD4)- ([facility] clinic only)   Comp Met ([facility])   Depression    Depression is stable with no suicidal ideations or signs of psychosis.  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  Continue to monitor.       Healthcare maintenance     Discussed importance of safe sexual practice to reduce risk of STI.  Condoms provided.  Started on Depo-Provera for birth control which she was previously on.  Point-of-care pregnancy test negative.  Due for routine dental care with referral placed to [facility]  Recommend follow-up for cervical cancer screening with gynecology or [facility] Pap clinic.        Other Visit Diagnoses    Screening for STDs (sexually transmitted diseases)       Relevant Orders   RPR   Cytology (oral, anal, urethral) ancillary only   Urine cytology ancillary only([place])   Encounter for initial prescription of injectable contraceptive       Relevant Orders   POCT urine pregnancy (Completed)       I am having Kristin Hart. Kristin Hart maintain her ibuprofen, mupirocin ointment, hydrocortisone cream, omeprazole, cetirizine, albuterol, diclofenac Sodium, naproxen, topiramate, and bictegravir-emtricitabine-tenofovir AF.   Meds ordered this encounter  Medications  . bictegravir-emtricitabine-tenofovir AF (BIKTARVY) 50-200-25 MG TABS tablet    Sig: Take 1 tablet by mouth daily.    Dispense:  30 tablet    Refill:  3    Order Specific Question:   Supervising Provider    Answer:   Carlyle Basques [[id number]]     Follow-up: Return in about 1 month (around 05/12/2020), or if symptoms worsen or fail to improve.   Terri Piedra, MSN, FNP-C Nurse Practitioner [facility] [place] number: [phone]

## 2020-04-15 LAB — T-HELPER CELL (CD4) - (RCID CLINIC ONLY)
CD4 % Helper T Cell: 55 % (ref 33–65)
CD4 T Cell Abs: 1113 /uL (ref 400–1790)

## 2020-04-15 MED ORDER — MEDROXYPROGESTERONE ACETATE 150 MG/ML IM SUSP
150.0000 mg | Freq: Once | INTRAMUSCULAR | Status: AC
  Administered 2020-04-14: 150 mg via INTRAMUSCULAR

## 2020-04-15 NOTE — Addendum Note (Signed)
Addended by: Tressa Busman T on: 04/15/2020 09:31 AM   Modules accepted: Orders

## 2020-04-17 ENCOUNTER — Other Ambulatory Visit: Payer: Self-pay | Admitting: Family

## 2020-04-17 DIAGNOSIS — K219 Gastro-esophageal reflux disease without esophagitis: Secondary | ICD-10-CM

## 2020-04-17 LAB — URINE CYTOLOGY ANCILLARY ONLY
Chlamydia: NEGATIVE
Comment: NEGATIVE
Comment: NORMAL
Neisseria Gonorrhea: NEGATIVE

## 2020-04-17 LAB — CYTOLOGY, (ORAL, ANAL, URETHRAL) ANCILLARY ONLY
Chlamydia: NEGATIVE
Comment: NEGATIVE
Comment: NORMAL
Neisseria Gonorrhea: NEGATIVE

## 2020-04-18 LAB — COMPREHENSIVE METABOLIC PANEL
AG Ratio: 1.5 (calc) (ref 1.0–2.5)
ALT: 22 U/L (ref 6–29)
AST: 27 U/L (ref 10–30)
Albumin: 4.3 g/dL (ref 3.6–5.1)
Alkaline phosphatase (APISO): 78 U/L (ref 31–125)
BUN: 22 mg/dL (ref 7–25)
CO2: 24 mmol/L (ref 20–32)
Calcium: 9.4 mg/dL (ref 8.6–10.2)
Chloride: 102 mmol/L (ref 98–110)
Creat: 0.96 mg/dL (ref 0.50–1.10)
Globulin: 2.8 g/dL (calc) (ref 1.9–3.7)
Glucose, Bld: 165 mg/dL — ABNORMAL HIGH (ref 65–99)
Potassium: 3.5 mmol/L (ref 3.5–5.3)
Sodium: 137 mmol/L (ref 135–146)
Total Bilirubin: 0.6 mg/dL (ref 0.2–1.2)
Total Protein: 7.1 g/dL (ref 6.1–8.1)

## 2020-04-18 LAB — HIV-1 RNA QUANT-NO REFLEX-BLD
HIV 1 RNA Quant: <20 Copies/mL
HIV-1 RNA Quant, Log: <1.3 Log cps/mL

## 2020-04-18 LAB — RPR: RPR Ser Ql: NONREACTIVE

## 2020-04-21 ENCOUNTER — Encounter: Payer: Self-pay | Admitting: Infectious Diseases

## 2020-04-21 ENCOUNTER — Ambulatory Visit (INDEPENDENT_AMBULATORY_CARE_PROVIDER_SITE_OTHER): Payer: Medicaid Other | Admitting: Infectious Diseases

## 2020-04-21 ENCOUNTER — Other Ambulatory Visit: Payer: Self-pay

## 2020-04-21 ENCOUNTER — Other Ambulatory Visit (HOSPITAL_COMMUNITY)
Admission: RE | Admit: 2020-04-21 | Discharge: 2020-04-21 | Disposition: A | Discharge status: Home / Self Care | Payer: Medicaid Other | Source: Ambulatory Visit | Attending: Infectious Diseases | Admitting: Infectious Diseases

## 2020-04-21 DIAGNOSIS — Z113 Encounter for screening for infections with a predominantly sexual mode of transmission: Secondary | ICD-10-CM | POA: Diagnosis not present

## 2020-04-21 DIAGNOSIS — M25551 Pain in right hip: Secondary | ICD-10-CM

## 2020-04-21 DIAGNOSIS — B20 Human immunodeficiency virus [HIV] disease: Secondary | ICD-10-CM

## 2020-04-21 DIAGNOSIS — M25552 Pain in left hip: Secondary | ICD-10-CM | POA: Diagnosis not present

## 2020-04-21 DIAGNOSIS — Z124 Encounter for screening for malignant neoplasm of cervix: Secondary | ICD-10-CM | POA: Diagnosis not present

## 2020-04-21 NOTE — Assessment & Plan Note (Signed)
FU with [name] as outlined. Well controlled on Biktarvy.

## 2020-04-21 NOTE — Progress Notes (Signed)
      Subjective:    Kristin Hart is a 40 y.o. female with well controlled HIV here for pelvic exam and pap smear.    Review of Systems: Current GYN complaints or concerns: none Patient denies any abdominal/pelvic pain, problems with bowel movements, urination, vaginal discharge or intercourse.   Past Medical History:  Diagnosis Date  . Asthma   . 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 Diabetes mellitus without complication ([facility])    type 2  . Foreign body in small intestine   . HIV (human immunodeficiency virus infection) ([facility])   . Hypothyroidism   . Stomach ulcer     Gynecologic History: G1P1000  Patient's last menstrual period was 04/13/2020. Contraception: depo-provera injections  Last Pap: uncertain. Does not report any abnormal in the past Anal Intercourse:  Last Mammogram: n/a.   Objective:  Physical Exam  Constitutional: Well developed, well nourished, no acute distress. She is alert and oriented x3.  Pelvic: External genitalia is normal in appearance. The vagina is normal in appearance. The cervix is bulbous and easily visualized. Tender with collecting cervical brushings and easy bleeding. Thin appearing cervicovaginal discharge noted. Deferred bimanual exam with hip pain and discomfort.  Breasts: symmetrical in contour, shape.  Psych: She has a normal mood and affect.    Assessment & Plan:    Problem List Items Addressed This Visit      Unprioritized   Screening for STDs (sexually transmitted diseases) - Primary    Cervicovaginal swab obtained.   Did not do internal exam today as she was in pain. Discussed presumptive treatment for gonorrhea/chlamydia but she would prefer to wait.       Relevant Orders   Cervicovaginal ancillary only( [place])   Screening for cervical cancer    Normal pelvic exam. Deferred bimanual exam with hip pain.  Cervical brushing collected for cytology and HPV. Her cervix is  mildly inflamed with easy bleeding. Slight thin white drainage noted. Will follow up cervicovaginal swab to determine if BV vs STI and treat accordingly. Did not want empiric treatment today.  Discussed recommended screening interval for women living with HIV disease meant to be lifelong and at an interval of Q1-3 years pending results. Acceptable to space out Q3y with 3 consecutively normal exams. Further recommendations for Kristin Hart to follow today's results.  Results will be communicated to the patient via telephone call.        Relevant Orders   Cytology - PAP( [place])   HIV (human immunodeficiency virus infection) (HCC) (Chronic)    FU with [name] as outlined. Well controlled on Biktarvy.       Bilateral hip pain    FU with PCP - XRay without osteoarthritis. Has anterior groin pain today on the left - ?adnexal tenderness maybe d/t STI? She would rather wait on test results than empiric treamtent. She is also limping clearly and has joint pain.          04/21/20 3:27 PM

## 2020-04-21 NOTE — Assessment & Plan Note (Signed)
FU with PCP - XRay without osteoarthritis. Has anterior groin pain today on the left - ?adnexal tenderness maybe d/t STI? She would rather wait on test results than empiric treamtent. She is also limping clearly and has joint pain.

## 2020-04-21 NOTE — Assessment & Plan Note (Addendum)
Normal pelvic exam. Deferred bimanual exam with hip pain.  Cervical brushing collected for cytology and HPV. Her cervix is mildly inflamed with easy bleeding. Slight thin white drainage noted. Will follow up cervicovaginal swab to determine if BV vs STI and treat accordingly. Did not want empiric treatment today.  Discussed recommended screening interval for women living with HIV disease meant to be lifelong and at an interval of Q1-3 years pending results. Acceptable to space out Q3y with 3 consecutively normal exams. Further recommendations for ADRIONNA DELCID to follow today's results.  Results will be communicated to the patient via telephone call.

## 2020-04-21 NOTE — Assessment & Plan Note (Addendum)
Cervicovaginal swab obtained.   Did not do internal exam today as she was in pain. Discussed presumptive treatment for gonorrhea/chlamydia but she would prefer to wait.

## 2020-04-22 LAB — CERVICOVAGINAL ANCILLARY ONLY
Chlamydia: NEGATIVE
Comment: NEGATIVE
Comment: NEGATIVE
Comment: NORMAL
Neisseria Gonorrhea: NEGATIVE
Trichomonas: NEGATIVE

## 2020-04-26 LAB — CYTOLOGY - PAP
Chlamydia: NEGATIVE
Comment: NEGATIVE
Comment: NEGATIVE
Comment: NEGATIVE
Comment: NORMAL
Diagnosis: UNDETERMINED — AB
High risk HPV: NEGATIVE
Neisseria Gonorrhea: NEGATIVE
Trichomonas: NEGATIVE

## 2020-04-28 ENCOUNTER — Telehealth: Payer: Self-pay

## 2020-04-28 NOTE — Telephone Encounter (Signed)
-----   Message from Diminique Jonathon Resides, [place] sent at 04/28/2020  3:23 PM EST -----  ----- Message ----- From: Blanchard Kelch, NP Sent: 04/28/2020   8:56 AM EST To: Judith Part, CMA  Please call Kristin Hart to let her know that she has no findings of any STDs from her pap smear.  She has some very minor abnormalities on her pap smear that we should follow up with another test in 1 year.  It is not too uncommon to have some changes that our body takes care of. The important thing for her is to continue on her HIV medication and repeat this in 1 year for surveillance.   Thank you

## 2020-04-28 NOTE — Telephone Encounter (Signed)
Attempted to call patient to relay pap results. Not able to reach patient at this time. Left voicemail requesting patient call office back to discuss in further details. Did not disclose results in message.

## 2020-04-28 NOTE — Progress Notes (Signed)
Please call Kristin Hart to let her know that she has no findings of any STDs from her pap smear.  She has some very minor abnormalities on her pap smear that we should follow up with another test in 1 year.  It is not too uncommon to have some changes that our body takes care of. The important thing for her is to continue on her HIV medication and repeat this in 1 year for surveillance.   Thank you

## 2020-04-29 NOTE — Telephone Encounter (Signed)
Patient returning call. RN relayed per Rexene Alberts, NP that there were no findings of any STDs on her pap smear, but that she does have some very minor abnormalities on her pap smear that should be followed up on in 1 year. Advised her that Judeth Cornfield said it is not too uncommon to have some changes that our body takes care of and that the important thing is for her to continue on her HIV medication and repeat this in 1 year to keep an eye on things. Patient asking about her most recent viral load. RN relayed that as of 04/14/20 her labs have come back as undetectable. Patient very appreciative of this news, she verbalized understanding and has no further questions.   Transferred patient to [facility] who has been trying to get in touch with her.   Sandie Ano, RN

## 2020-05-05 DIAGNOSIS — Z20822 Contact with and (suspected) exposure to covid-19: Secondary | ICD-10-CM | POA: Diagnosis not present

## 2020-05-10 ENCOUNTER — Ambulatory Visit: Payer: Medicaid Other | Attending: Family Medicine | Admitting: Physical Therapy

## 2020-05-12 ENCOUNTER — Ambulatory Visit: Payer: Medicaid Other | Admitting: Family

## 2020-05-12 ENCOUNTER — Ambulatory Visit: Payer: Medicaid Other

## 2020-05-13 ENCOUNTER — Other Ambulatory Visit: Payer: Self-pay

## 2020-05-13 ENCOUNTER — Ambulatory Visit (INDEPENDENT_AMBULATORY_CARE_PROVIDER_SITE_OTHER): Payer: Medicaid Other | Admitting: Family Medicine

## 2020-05-13 ENCOUNTER — Encounter: Payer: Self-pay | Admitting: Family Medicine

## 2020-05-13 VITALS — BP 144/88 | HR 98 | Ht 67.5 in | Wt 154.6 lb

## 2020-05-13 DIAGNOSIS — L0291 Cutaneous abscess, unspecified: Secondary | ICD-10-CM | POA: Diagnosis present

## 2020-05-13 DIAGNOSIS — J4521 Mild intermittent asthma with (acute) exacerbation: Secondary | ICD-10-CM

## 2020-05-13 DIAGNOSIS — F1520 Other stimulant dependence, uncomplicated: Secondary | ICD-10-CM | POA: Diagnosis not present

## 2020-05-13 DIAGNOSIS — G43001 Migraine without aura, not intractable, with status migrainosus: Secondary | ICD-10-CM

## 2020-05-13 DIAGNOSIS — Z599 Problem related to housing and economic circumstances, unspecified: Secondary | ICD-10-CM

## 2020-05-13 MED ORDER — AMOXICILLIN-POT CLAVULANATE 875-125 MG PO TABS
1.0000 | ORAL_TABLET | Freq: Two times a day (BID) | ORAL | 0 refills | Status: AC
Start: 2020-05-13 — End: 2020-05-20

## 2020-05-13 MED ORDER — CETIRIZINE HCL 10 MG PO TABS: 10.0000 mg | ORAL_TABLET | Freq: Every day | ORAL | 11 refills | Status: DC

## 2020-05-13 MED ORDER — ALBUTEROL SULFATE HFA 108 (90 BASE) MCG/ACT IN AERS: 1.0000 | INHALATION_SPRAY | RESPIRATORY_TRACT | 2 refills | Status: DC | PRN

## 2020-05-13 NOTE — Progress Notes (Signed)
SUBJECTIVE:   CHIEF COMPLAINT / HPI:   Submental mass  concern for abscess Patient reports she has a mass on the underside of her jaw that is painful to touch and has been present for the last 1-2 weeks. She reports no known dental infection and no infectious symptoms such as fever, chills, erythema, and enlarging swelling in the area. She denies difficulty with swallowing or breathing. Patient reports that she has had a mass similar to this in the same spot after a dental infection. She states she required hospitalization at that time and the area was drained and she had to pack it afterwards.   Elevated BP Patient reports that she used meth early today prior to our visit. She states that she uses most days that she sees a doctor and it is why her BP has been elevated  Migraines Patient recently started on topiramate for migraines, she reports she has not had a migraine since she started. She did not titrate up the medication and is only taking 25mg  daily. Reports prior triggers including raining weather have not triggered any migraines.  Social  Patient reports that she is very anxious lately because on 3/10 there is a neighborhood meeting about the house that she is living in and she is worried that she will not have a place to live. She does state that her child's grandmother will allow her to stay in a certain area of her house for a limited amount of time.   PERTINENT  PMH / PSH: meth use, HIV, migraines, anxiety/depression  OBJECTIVE:   BP (!) 144/88   Pulse 98   Ht 5' 7.5" (1.715 m)   Wt 154 lb 9.6 oz (70.1 kg)   LMP 04/13/2020   SpO2 99%   BMI 23.86 kg/m   Gen: well-nourished, NAD, sitting in clinic Neck: submental mass ~3cm in diameter slightly to the left of midline and crosses over midline, extreme tenderness to palpation, minimal erythema, no induration and unable to appreciate fluctuance. No lymph cervical lymph nodes appreciated.  Mouth: poor dental hygiene with  multiple teeth missing and others with severe dental cavities and discoloration.  ASSESSMENT/PLAN:   Submental mass  Concern for abscess  Concern for soft tissue abscess, no evidence of dental infection at time of exam. Mass ~3cm with extreme tenderness to palpation, no fever or erythema present. Patient reports a history of abscess requiring hospitalization and I&D, there are no records in our system and may have been completed at an outside hospital--we cannot determine what antibiotics were previously used and what organism was isolated. Possible reactive lymphadenopathy from recent viral or bacterial illness (though patient has not reported any recent infectious symptoms), or a dermoid cyst, unlikely lipoma as mass is painful. Very unlikely to be a cancerous mass due to quick onset, those masses are often asymptomatic, and patient has not had any weight loss or other cancer concerning symptoms. Patient does have HIV, currently on Biktarvy with viral load undetectable.  - Augmentin 875mg  BID x 7 days - Scheduled appointment for close follow-up on 3/8 - CT scan to assess deeper infection and concern for dental origin  Migraines - Continue topiramate 25mg  daily for migraine ppx  Elevated BP in the setting of recent meth use Patient has used meth prior to recent visits, unsure of baseline blood pressure when not under some influence of drugs.  - No medications at this time - Patient counseled on cessation of drug use  Social Patient has extreme  needs for improvement in housing, which has been a chronic problem. Patient has difficulty with transportation, which is a barrier to getting to and from her appointments. - Patient filled out [place] transportation system, will send in application  Evelena Leyden, DO [facility]

## 2020-05-13 NOTE — Patient Instructions (Signed)
The mass on her neck is concerning for a possible abscess, especially given your history of high 1 day before.  I am prescribing you an antibiotic that I want you to take for the next 7 days, you will take it twice a day.  If you start to notice any worsening of your symptoms including fevers, redness, tenderness, the mass growing in size please come in immediately, or go to the ED if the clinic is not open.  I would like you to be able to get a CT scan to make sure this is not going further into your neck, we will try and schedule this once we are able to but may not be until next week.     It is possible that the small bumps you are having, under your armpit are also small skin abscesses.  Continue to do warm compresses at this time and over-the-counter pain medications as needed for pain.    Skin Abscess  A skin abscess is an infected area on or under your skin that contains a collection of pus and other material. An abscess may also be called a furuncle, carbuncle, or boil. An abscess can occur in or on almost any part of your body. Some abscesses break open (rupture) on their own. Most continue to get worse unless they are treated. The infection can spread deeper into the body and eventually into your blood, which can make you feel ill. Treatment usually involves draining the abscess. What are the causes? An abscess occurs when germs, like bacteria, pass through your skin and cause an infection. This may be caused by:  A scrape or cut on your skin.  A puncture wound through your skin, including a needle injection or insect bite.  Blocked oil or sweat glands.  Blocked and infected hair follicles.  A cyst that forms beneath your skin (sebaceous cyst) and becomes infected. What increases the risk? This condition is more likely to develop in people who:  Have a weak body defense system (immune system).  Have diabetes.  Have dry and irritated skin.  Get frequent injections or use  illegal IV drugs.  Have a foreign body in a wound, such as a splinter.  Have problems with their lymph system or veins. What are the signs or symptoms? Symptoms of this condition include:  A painful, firm bump under the skin.  A bump with pus at the top. This may break through the skin and drain. Other symptoms include:  Redness surrounding the abscess site.  Warmth.  Swelling of the lymph nodes (glands) near the abscess.  Tenderness.  A sore on the skin. How is this diagnosed? This condition may be diagnosed based on:  A physical exam.  Your medical history.  A sample of pus. This may be used to find out what is causing the infection.  Blood tests.  Imaging tests, such as an ultrasound, CT scan, or MRI. How is this treated? A small abscess that drains on its own may not need treatment. Treatment for larger abscesses may include:  Moist heat or heat pack applied to the area several times a day.  A procedure to drain the abscess (incision and drainage).  Antibiotic medicines. For a severe abscess, you may first get antibiotics through an IV and then change to antibiotics by mouth. Follow these instructions at home: Medicines  Take over-the-counter and prescription medicines only as told by your health care provider.  If you were prescribed an antibiotic medicine, take it  as told by your health care provider. Do not stop taking the antibiotic even if you start to feel better.   Abscess care  If you have an abscess that has not drained, apply heat to the affected area. Use the heat source that your health care provider recommends, such as a moist heat pack or a heating pad. ? Place a towel between your skin and the heat source. ? Leave the heat on for 20-30 minutes. ? Remove the heat if your skin turns bright red. This is especially important if you are unable to feel pain, heat, or cold. You may have a greater risk of getting burned.  Follow instructions from your  health care provider about how to take care of your abscess. Make sure you: ? Cover the abscess with a bandage (dressing). ? Change your dressing or gauze as told by your health care provider. ? Wash your hands with soap and water before you change the dressing or gauze. If soap and water are not available, use hand sanitizer.  Check your abscess every day for signs of a worsening infection. Check for: ? More redness, swelling, or pain. ? More fluid or blood. ? Warmth. ? More pus or a bad smell.   General instructions  To avoid spreading the infection: ? Do not share personal care items, towels, or hot tubs with others. ? Avoid making skin contact with other people.  Keep all follow-up visits as told by your health care provider. This is important. Contact a health care provider if you have:  More redness, swelling, or pain around your abscess.  More fluid or blood coming from your abscess.  Warm skin around your abscess.  More pus or a bad smell coming from your abscess.  A fever.  Muscle aches.  Chills or a general ill feeling. Get help right away if you:  Have severe pain.  See red streaks on your skin spreading away from the abscess. Summary  A skin abscess is an infected area on or under your skin that contains a collection of pus and other material.  A small abscess that drains on its own may not need treatment.  Treatment for larger abscesses may include having a procedure to drain the abscess and taking an antibiotic. This information is not intended to replace advice given to you by your health care provider. Make sure you discuss any questions you have with your health care provider. Document Revised: 06/19/2018 Document Reviewed: 04/11/2017 Elsevier Patient Education  2021 [employer].

## 2020-05-17 ENCOUNTER — Ambulatory Visit: Payer: Medicaid Other

## 2020-05-17 NOTE — Progress Notes (Deleted)
    SUBJECTIVE:   CHIEF COMPLAINT / HPI:   Neck abscess: under her jaw.  Previous dental infection with similar mass.  hiv.  Hx of self mutiliation?? Uses meth.  Poor dental hygeine. Was puton augmentin x 7d.  Didn't get ct done.    PERTINENT  PMH / PSH: ***  OBJECTIVE:   There were no vitals taken for this visit.  ***  ASSESSMENT/PLAN:   No problem-specific Assessment & Plan notes found for this encounter.     Sandre Kitty, MD [place] [facility]   {    This will disappear when note is signed, click to select method of visit    :1}

## 2020-05-19 ENCOUNTER — Ambulatory Visit (HOSPITAL_COMMUNITY): Payer: Medicaid Other

## 2020-05-26 ENCOUNTER — Encounter (HOSPITAL_COMMUNITY): Payer: Self-pay

## 2020-05-26 ENCOUNTER — Other Ambulatory Visit: Payer: Self-pay

## 2020-05-26 ENCOUNTER — Ambulatory Visit (HOSPITAL_COMMUNITY)
Admission: RE | Admit: 2020-05-26 | Discharge: 2020-05-26 | Disposition: A | Discharge status: Home / Self Care | Payer: Medicaid Other | Source: Ambulatory Visit | Attending: Family Medicine | Admitting: Family Medicine

## 2020-05-26 DIAGNOSIS — L0291 Cutaneous abscess, unspecified: Secondary | ICD-10-CM

## 2020-05-26 MED ORDER — IOHEXOL 300 MG/ML  SOLN: 75.0000 mL | Freq: Once | INTRAMUSCULAR | Status: DC | PRN

## 2020-05-31 ENCOUNTER — Telehealth: Payer: Self-pay

## 2020-05-31 ENCOUNTER — Ambulatory Visit: Payer: Medicaid Other | Admitting: Student in an Organized Health Care Education/Training Program

## 2020-05-31 ENCOUNTER — Emergency Department (HOSPITAL_COMMUNITY)
Admission: EM | Admit: 2020-05-31 | Discharge: 2020-06-01 | Disposition: A | Discharge status: Home / Self Care | Payer: Medicaid Other | Attending: Emergency Medicine | Admitting: Emergency Medicine

## 2020-05-31 DIAGNOSIS — E039 Hypothyroidism, unspecified: Secondary | ICD-10-CM | POA: Insufficient documentation

## 2020-05-31 DIAGNOSIS — Z21 Asymptomatic human immunodeficiency virus [HIV] infection status: Secondary | ICD-10-CM | POA: Diagnosis not present

## 2020-05-31 DIAGNOSIS — N898 Other specified noninflammatory disorders of vagina: Secondary | ICD-10-CM | POA: Diagnosis present

## 2020-05-31 DIAGNOSIS — E119 Type 2 diabetes mellitus without complications: Secondary | ICD-10-CM | POA: Diagnosis not present

## 2020-05-31 DIAGNOSIS — J45909 Unspecified asthma, uncomplicated: Secondary | ICD-10-CM | POA: Diagnosis not present

## 2020-05-31 DIAGNOSIS — A6004 Herpesviral vulvovaginitis: Secondary | ICD-10-CM | POA: Diagnosis not present

## 2020-05-31 LAB — URINALYSIS, ROUTINE W REFLEX MICROSCOPIC
Bilirubin Urine: NEGATIVE
Glucose, UA: NEGATIVE mg/dL
Hgb urine dipstick: NEGATIVE
Ketones, ur: 5 mg/dL — AB
Nitrite: NEGATIVE
Protein, ur: 30 mg/dL — AB
Specific Gravity, Urine: 1.024 (ref 1.005–1.030)
pH: 6 (ref 5.0–8.0)

## 2020-05-31 LAB — CBC WITH DIFFERENTIAL/PLATELET
Abs Immature Granulocytes: 0.04 10*3/uL (ref 0.00–0.07)
Basophils Absolute: 0 10*3/uL (ref 0.0–0.1)
Basophils Relative: 0 %
Eosinophils Absolute: 0.4 10*3/uL (ref 0.0–0.5)
Eosinophils Relative: 4 %
HCT: 35.6 % — ABNORMAL LOW (ref 36.0–46.0)
Hemoglobin: 11.2 g/dL — ABNORMAL LOW (ref 12.0–15.0)
Immature Granulocytes: 0 %
Lymphocytes Relative: 12 %
Lymphs Abs: 1.2 10*3/uL (ref 0.7–4.0)
MCH: 25.6 pg — ABNORMAL LOW (ref 26.0–34.0)
MCHC: 31.5 g/dL (ref 30.0–36.0)
MCV: 81.5 fL (ref 80.0–100.0)
Monocytes Absolute: 0.8 10*3/uL (ref 0.1–1.0)
Monocytes Relative: 8 %
Neutro Abs: 7.7 10*3/uL (ref 1.7–7.7)
Neutrophils Relative %: 76 %
Platelets: 297 10*3/uL (ref 150–400)
RBC: 4.37 MIL/uL (ref 3.87–5.11)
RDW: 13.7 % (ref 11.5–15.5)
WBC: 10.1 10*3/uL (ref 4.0–10.5)
nRBC: 0 % (ref 0.0–0.2)

## 2020-05-31 LAB — I-STAT BETA HCG BLOOD, ED (MC, WL, AP ONLY): I-stat hCG, quantitative: <5 m[IU]/mL (ref <5)

## 2020-05-31 NOTE — Telephone Encounter (Signed)
Patient calls nurse line requesting to schedule same day appointment for UTI symptoms. Patient reports burning with urination and being unable to empty bladder completely.   Scheduled same day appointment with Dr. Dareen Piano for further evaluation.   Veronda Prude, RN

## 2020-05-31 NOTE — ED Triage Notes (Addendum)
Pt reports think she has an UTI  Due to burning sensation while urinating or yeast infection and red bumps on the vagina as well and is concern doesn't think its in groin hairs

## 2020-06-01 LAB — WET PREP, GENITAL
Clue Cells Wet Prep HPF POC: NONE SEEN
Sperm: NONE SEEN
Trich, Wet Prep: NONE SEEN
Yeast Wet Prep HPF POC: NONE SEEN

## 2020-06-01 LAB — COMPREHENSIVE METABOLIC PANEL
ALT: 19 U/L (ref 0–44)
AST: 26 U/L (ref 15–41)
Albumin: 3.6 g/dL (ref 3.5–5.0)
Alkaline Phosphatase: 82 U/L (ref 38–126)
Anion gap: 7 (ref 5–15)
BUN: 13 mg/dL (ref 6–20)
CO2: 23 mmol/L (ref 22–32)
Calcium: 9.2 mg/dL (ref 8.9–10.3)
Chloride: 104 mmol/L (ref 98–111)
Creatinine, Ser: 0.83 mg/dL (ref 0.44–1.00)
GFR, Estimated: >60 mL/min (ref >60)
Glucose, Bld: 184 mg/dL — ABNORMAL HIGH (ref 70–99)
Potassium: 3.6 mmol/L (ref 3.5–5.1)
Sodium: 134 mmol/L — ABNORMAL LOW (ref 135–145)
Total Bilirubin: 0.5 mg/dL (ref 0.3–1.2)
Total Protein: 6.9 g/dL (ref 6.5–8.1)

## 2020-06-01 MED ORDER — FLUCONAZOLE 200 MG PO TABS: 200.0000 mg | ORAL_TABLET | ORAL | 0 refills | Status: AC

## 2020-06-01 MED ORDER — ACYCLOVIR 400 MG PO TABS: 400.0000 mg | ORAL_TABLET | Freq: Four times a day (QID) | ORAL | 0 refills | Status: AC

## 2020-06-01 MED ORDER — ACYCLOVIR 400 MG PO TABS: 400.0000 mg | ORAL_TABLET | Freq: Four times a day (QID) | ORAL | 0 refills | Status: DC

## 2020-06-01 NOTE — ED Notes (Signed)
Called pt. x3 with no answer.  

## 2020-06-01 NOTE — ED Provider Notes (Signed)
[facility] EMERGENCY DEPARTMENT Provider Note   CSN: [id number] Arrival date & time: 05/31/20  2200     History No chief complaint on file.   Kristin Hart is a 40 y.o. female.  HPI Patient is 40 year old female with a past medical history of methamphetamine use, borderline personality disorder, DM 2, HIV on Biktarvy followed by infectious disease at [facility], stomach ulcer, polysubstance abuse  Patient presented today with vaginal irritation.  She states is been ongoing for 1 week.  She states she has a history of getting yeast infections as well as UTIs although she has not had 1-2 UTIs in the past 10 years.  She denies any abdominal pain, nausea or vomiting or headache.  No fevers or chills.  She denies any significant vaginal discharge.  She states that she has several sexual partners does not use barrier protection with one of them and that female partner has numerous other sexual partners.  She is uncertain whether he uses any barrier protection with those partners.  She states that her HIV is controlled with Biktarvy and that her viral load has been undetectably low.  No recent antibiotics used  Denies any other significant associated symptoms.     Past Medical History:  Diagnosis Date  . Asthma   . 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 Diabetes mellitus without complication (HCC)    type 2  . Foreign body in small intestine   . HIV (human immunodeficiency virus infection) (HCC)   . Hypothyroidism   . Stomach ulcer     Patient Active Problem List   Diagnosis Date Noted  . Screening for cervical cancer 04/21/2020  . Screening for STDs (sexually transmitted diseases) 04/21/2020  . Costochondritis 02/19/2020  . Bilateral hip pain 02/19/2020  . Elbow pain, right 12/04/2019  . Housing problems 11/23/2019  . Mental health disorder 10/12/2019  . 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  . Paresthesias  10/12/2019  . Healthcare maintenance 08/05/2019  . HIV (human immunodeficiency virus infection) (HCC) 12/31/2018  . Anxiety 05/29/2017  . Self-mutilation 05/29/2017  . Depression 06/07/2016  . Marijuana use 06/07/2016  . Low vitamin D level 03/15/2016  . Prediabetes 02/22/2016  . Asthma 02/22/2016  . ADD (attention deficit disorder) 02/22/2016  . Methamphetamine use disorder, severe, 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/22/2016  . Hyperlipidemia with target LDL less than 70 01/31/2012  . Migraine 01/31/2012  . Tremor, 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    Past Surgical History:  Procedure Laterality Date  . CESAREAN SECTION N/A 07/04/2016   Procedure: CESAREAN SECTION;  Surgeon: [name], DO;  Location: [facility] BIRTHING SUITES;  Service: Obstetrics;  Laterality: N/A;  . ENTEROSCOPY N/A 11/08/2017   Procedure: ENTEROSCOPY;  Surgeon: Hilarie Fredrickson, MD;  Location: [facility] [place] ENDOSCOPY;  Service: Endoscopy;  Laterality: N/A;  . NO PAST SURGERIES       OB History    Gravida  1   Para  1   Term  1   Preterm      AB      Living        SAB      IAB      Ectopic      Multiple  0   Live Births              No family history on file.  Social History   Tobacco Use  . Smoking status: Never Smoker  . Smokeless tobacco: Never Used  Vaping Use  . Vaping Use: Never used  Substance Use Topics  . Alcohol use: No  . Drug use: Yes    Types: Methamphetamines, Marijuana    Comment: Couple of times per month    Home Medications Prior to Admission medications   Medication Sig Start Date End Date Taking? Authorizing Provider  fluconazole (DIFLUCAN) 200 MG tablet Take 1 tablet (200 mg total) by mouth once a week for 2 doses. Take one tablet now and if symptoms persist you may take the second dose in 1 week 06/02/20 06/10/20 Yes Kawanna Christley, Rodrigo Ran, PA  acyclovir (ZOVIRAX) 400 MG tablet Take 1 tablet (400 mg total) by mouth 4 (four) times daily for 7 days. 06/01/20 06/08/20  Gailen Shelter, PA  albuterol (PROAIR  HFA) 108 (90 Base) MCG/ACT inhaler Inhale 1 puff into the lungs every 4 (four) hours as needed. 05/13/20   Lilland, Alana, DO  bictegravir-emtricitabine-tenofovir AF (BIKTARVY) 50-200-25 MG TABS tablet Take 1 tablet by mouth daily. 04/14/20   Veryl Speak, FNP  cetirizine (ZYRTEC) 10 MG tablet Take 1 tablet (10 mg total) by mouth daily. 05/13/20   Lilland, Alana, DO  diclofenac Sodium (VOLTAREN) 1 % GEL Apply 2 g topically 4 (four) times daily as needed (for elbow pain). As needed for hip pain. 04/13/20   Lilland, Alana, DO  hydrocortisone cream 1 % Apply to affected area 2 times daily 08/17/18   Henderly, Britni A, PA-C  ibuprofen (ADVIL,MOTRIN) 600 MG tablet Take 1 tablet (600 mg total) by mouth every 6 (six) hours as needed. Patient taking differently: Take 800 mg by mouth every 6 (six) hours as needed. 07/07/16   Arabella Merles, CNM  mupirocin ointment Idelle Jo) 2 %  01/11/18   [provider]  naproxen (NAPROSYN) 500 MG tablet Take 1 tablet (500 mg total) by mouth 2 (two) times daily with a meal. 04/13/20   Lilland, Alana, DO  omeprazole (PRILOSEC) 40 MG capsule TAKE 1 CAPSULE BY MOUTH EVERY DAY 04/18/20   Veryl Speak, FNP  topiramate (TOPAMAX) 25 MG tablet Take 1 tablet (25 mg total) by mouth 2 (two) times daily. Take 25 mg orally once daily in the evening for first week, 25 mg twice daily for second week, 25 mg in the morning and 50 mg in the evening for third week, and then 50 mg twice daily 04/13/20   Lilland, Alana, DO    Allergies    Patient has no known allergies.  Review of Systems   Review of Systems  Constitutional: Negative for chills and fever.  HENT: Negative for congestion.   Eyes: Negative for pain.  Respiratory: Negative for cough and shortness of breath.   Cardiovascular: Negative for chest pain and leg swelling.  Gastrointestinal: Negative for abdominal pain and vomiting.  Genitourinary: Positive for vaginal pain. Negative for dysuria.  Musculoskeletal: Negative  for myalgias.  Skin: Negative for rash.  Neurological: Negative for dizziness and headaches.    Physical Exam Updated Vital Signs BP (!) 167/107 (BP Location: Right Arm)   Pulse 85   Temp 97.6 F (36.4 C) (Oral)   Resp 16   SpO2 98%   Physical Exam Vitals and nursing note reviewed.  Constitutional:      General: She is not in acute distress. HENT:     Head: Normocephalic and atraumatic.     Nose: Nose normal.  Eyes:     General: No scleral icterus. Cardiovascular:     Rate and Rhythm: Normal rate  and regular rhythm.     Pulses: Normal pulses.     Heart sounds: Normal heart sounds.  Pulmonary:     Effort: Pulmonary effort is normal. No respiratory distress.     Breath sounds: No wheezing.  Abdominal:     Palpations: Abdomen is soft.     Tenderness: There is no abdominal tenderness.  Genitourinary:    Comments: Multiple small fluid-filled vesicles on the mons pubis approximately midline.  Vaginal exam is notable for ulcerative lesions in the vaginal canal.  There is no significant cervical motion tenderness or adnexal tenderness however exam is somewhat confounded by the severe vaginal wall pain patient has with exam. Musculoskeletal:     Cervical back: Normal range of motion.     Right lower leg: No edema.     Left lower leg: No edema.  Skin:    General: Skin is warm and dry.     Capillary Refill: Capillary refill takes less than 2 seconds.  Neurological:     Mental Status: She is alert. Mental status is at baseline.  Psychiatric:        Mood and Affect: Mood normal.        Behavior: Behavior normal.     ED Results / Procedures / Treatments   Labs (all labs ordered are listed, but only abnormal results are displayed) Labs Reviewed  WET PREP, GENITAL - Abnormal; Notable for the following components:      Result Value   WBC, Wet Prep HPF POC MANY (*)    All other components within normal limits  COMPREHENSIVE METABOLIC PANEL - Abnormal; Notable for the following  components:   Sodium 134 (*)    Glucose, Bld 184 (*)    All other components within normal limits  CBC WITH DIFFERENTIAL/PLATELET - Abnormal; Notable for the following components:   Hemoglobin 11.2 (*)    HCT 35.6 (*)    MCH 25.6 (*)    All other components within normal limits  URINALYSIS, ROUTINE W REFLEX MICROSCOPIC - Abnormal; Notable for the following components:   APPearance HAZY (*)    Ketones, ur 5 (*)    Protein, ur 30 (*)    Leukocytes,Ua TRACE (*)    Bacteria, UA RARE (*)    All other components within normal limits  HSV CULTURE AND TYPING  I-STAT BETA HCG BLOOD, ED ([facility], [facility], [facility] ONLY)  GC/CHLAMYDIA PROBE AMP ([place]) NOT AT [facility]    EKG None  Radiology No results found.  Procedures Procedures   Medications Ordered in ED Medications - No data to display  ED Course  I have reviewed the triage vital signs and the nursing notes.  Pertinent labs & imaging results that were available during my care of the patient were reviewed by me and considered in my medical decision making (see chart for details).    MDM Rules/Calculators/A&P                          Patient is a 40 year old female with past medical history significant for HIV, risky sexual practices, see HPI for full details  Patient is presented today with vaginal irritation she was noted to have ulcers on her vaginal mucosa.  I recommend that she follow-up with her OB/GYN as well as infectious disease we will go ahead and start patient on acyclovir.  Suspect that she has herpes simplex given the vesicular lesions.  She is also requesting fluconazole which I will provide  her with.  CMP without significant abnormalities.  CBC without significant leukocytosis or clinically significant anemia.  Urinalysis with rare bacteria trace leukocytes some protein and some contents.  She will follow up with PCP for recheck of this given the protein in her urine.  Wet prep without any significant abnormalities there are  many WBCs present but no other abnormalities.  I did obtain HSV culture and typing and GC chlamydia probe which are pending at this time.  Patient understands return precautions. Final Clinical Impression(s) / ED Diagnoses Final diagnoses:  Herpes simplex vulvovaginitis    Rx / DC Orders ED Discharge Orders         Ordered    fluconazole (DIFLUCAN) 200 MG tablet  Weekly        06/01/20 1122    acyclovir (ZOVIRAX) 400 MG tablet  4 times daily,   Status:  Discontinued        06/01/20 1101    acyclovir (ZOVIRAX) 400 MG tablet  4 times daily        06/01/20 1102           Solon Augusta Gazelle, [place] 06/01/20 1707    Arby Barrette, MD 06/02/20 [phone]

## 2020-06-01 NOTE — Discharge Instructions (Signed)
Please take the antiviral medications I prescribed you for the next 7 days.  Please drink plenty of water.  Please follow-up with your infectious disease doctor.  Please stop using methamphetamine and read the attached resource information I have provided you.  It is vitally important that you are sexually abstinent until you are cleared by infectious disease.  This will be well after your lesions have healed.

## 2020-06-01 NOTE — ED Notes (Signed)
Assist pt with pelvic exam. Provided pt with sandwich and drink.

## 2020-06-02 ENCOUNTER — Ambulatory Visit: Payer: Medicaid Other | Admitting: Physical Therapy

## 2020-06-02 ENCOUNTER — Ambulatory Visit: Payer: Medicaid Other

## 2020-06-02 LAB — GC/CHLAMYDIA PROBE AMP (~~LOC~~) NOT AT ARMC
Chlamydia: NEGATIVE
Comment: NEGATIVE
Comment: NORMAL
Neisseria Gonorrhea: NEGATIVE

## 2020-06-02 LAB — HSV CULTURE AND TYPING

## 2020-06-07 ENCOUNTER — Ambulatory Visit: Payer: Medicaid Other

## 2020-06-07 ENCOUNTER — Other Ambulatory Visit: Payer: Self-pay

## 2020-06-08 ENCOUNTER — Ambulatory Visit (INDEPENDENT_AMBULATORY_CARE_PROVIDER_SITE_OTHER): Payer: Medicaid Other | Admitting: Family

## 2020-06-08 ENCOUNTER — Encounter: Payer: Self-pay | Admitting: Family

## 2020-06-08 VITALS — Wt 152.0 lb

## 2020-06-08 DIAGNOSIS — Z0000 Encounter for general adult medical examination without abnormal findings: Secondary | ICD-10-CM | POA: Diagnosis not present

## 2020-06-08 DIAGNOSIS — Z21 Asymptomatic human immunodeficiency virus [HIV] infection status: Secondary | ICD-10-CM

## 2020-06-08 DIAGNOSIS — A6004 Herpesviral vulvovaginitis: Secondary | ICD-10-CM | POA: Diagnosis not present

## 2020-06-08 NOTE — Assessment & Plan Note (Signed)
Kristin Hart has symptoms consistent with herpes simplex outbreak and would favor this diagnosis as opposed to varicella-zoster.  She continues to complete her acyclovir and reports her symptoms are improving slowly.  Discussed the nature of herpes and varicella.  Continue to monitor.

## 2020-06-08 NOTE — Assessment & Plan Note (Signed)
   Discussed importance of safe sexual practice to reduce risk of STI.  Condoms provided.  Declines immunizations.

## 2020-06-08 NOTE — Assessment & Plan Note (Signed)
Ms. Kristin Hart continues to have well-controlled HIV disease with good adherence and tolerance to her ART regimen of Biktarvy.  No signs/symptoms of opportunistic infection or progressive HIV.  We reviewed previous lab work and discussed plan of care.  Continue current dose of Biktarvy.  Plan for follow-up in 1 month or sooner if needed.

## 2020-06-08 NOTE — Patient Instructions (Addendum)
Nice to see you.  Continue to take your Ethridge daily as prescribed.  Refills are available at the pharmacy.  Plan for follow up in 1 month or sooner if needed.   Have a great day and stay safe!

## 2020-06-08 NOTE — Progress Notes (Signed)
Subjective:    Patient ID: Kristin Hart, female    DOB: 1980/12/31, 40 y.o.   MRN: [id number]  Chief Complaint  Patient presents with  . Follow-up    Questions about shingles (vaccine and having it); reports having repeated abscesses (axilla and groin area)  Condoms, lube, toiletries and food given.       HPI:  Kristin Hart is a 40 y.o. female with HIV disease last seen on 04/14/20 with well-controlled virus and good adherence and tolerance to her ART regimen of Biktarvy.  Viral load was undetectable with CD4 count of 1113.  In the interim she has completed her cervical cancer screening and was recently seen in the emergency department and diagnosed with herpes simplex vulvovaginitis.  Here today for routine follow-up.  Kristin Hart continues to take her Biktarvy daily as prescribed with no adverse side effects or missed doses since her last office visit.  Overall feeling well today.  Previous herpes rash is improving and has several questions of herpes versus varicella-zoster. Denies fevers, chills, night sweats, headaches, changes in vision, neck pain/stiffness, nausea, diarrhea, vomiting, lesions or rashes.  Kristin Hart has no problems obtaining medication from the pharmacy remains covered through [facility].  Denies feelings of being down, depressed, or hopeless recently.  Has continue to work with counseling services.  Continues to use methamphetamine and marijuana with no alcohol consumption or tobacco use.  Condoms and toiletries provided.  Declines immunizations.  No Known Allergies    Outpatient Medications Prior to Visit  Medication Sig Dispense Refill  . acyclovir (ZOVIRAX) 400 MG tablet Take 1 tablet (400 mg total) by mouth 4 (four) times daily for 7 days. 28 tablet 0  . bictegravir-emtricitabine-tenofovir AF (BIKTARVY) 50-200-25 MG TABS tablet Take 1 tablet by mouth daily. 30 tablet 3  . cetirizine (ZYRTEC) 10 MG tablet Take 1 tablet (10 mg total) by mouth daily. 30  tablet 11  . diclofenac Sodium (VOLTAREN) 1 % GEL Apply 2 g topically 4 (four) times daily as needed (for elbow pain). As needed for hip pain. 2 g 3  . fluconazole (DIFLUCAN) 200 MG tablet Take 1 tablet (200 mg total) by mouth once a week for 2 doses. Take one tablet now and if symptoms persist you may take the second dose in 1 week 2 tablet 0  . naproxen (NAPROSYN) 500 MG tablet Take 1 tablet (500 mg total) by mouth 2 (two) times daily with a meal. 30 tablet 0  . omeprazole (PRILOSEC) 40 MG capsule TAKE 1 CAPSULE BY MOUTH EVERY DAY 30 capsule 1  . topiramate (TOPAMAX) 25 MG tablet Take 1 tablet (25 mg total) by mouth 2 (two) times daily. Take 25 mg orally once daily in the evening for first week, 25 mg twice daily for second week, 25 mg in the morning and 50 mg in the evening for third week, and then 50 mg twice daily 70 tablet 0  . albuterol (PROAIR HFA) 108 (90 Base) MCG/ACT inhaler Inhale 1 puff into the lungs every 4 (four) hours as needed. 18 g 2  . hydrocortisone cream 1 % Apply to affected area 2 times daily 15 g 0  . ibuprofen (ADVIL,MOTRIN) 600 MG tablet Take 1 tablet (600 mg total) by mouth every 6 (six) hours as needed. (Patient taking differently: Take 800 mg by mouth every 6 (six) hours as needed.) 30 tablet 0  . mupirocin ointment (BACTROBAN) 2 %   0   No facility-administered medications prior to visit.  Past Medical History:  Diagnosis Date  . Asthma   . 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 Diabetes mellitus without complication (HCC)    type 2  . Foreign body in small intestine   . HIV (human immunodeficiency virus infection) (HCC)   . Hypothyroidism   . Stomach ulcer      Past Surgical History:  Procedure Laterality Date  . CESAREAN SECTION N/A 07/04/2016   Procedure: CESAREAN SECTION;  Surgeon: [name], DO;  Location: [facility] BIRTHING SUITES;  Service: Obstetrics;  Laterality: N/A;  . ENTEROSCOPY N/A 11/08/2017    Procedure: ENTEROSCOPY;  Surgeon: Hilarie Fredrickson, MD;  Location: [facility] ENDOSCOPY;  Service: Endoscopy;  Laterality: N/A;  . NO PAST SURGERIES         Review of Systems  Constitutional: Negative for appetite change, chills, diaphoresis, fatigue, fever and unexpected weight change.  Eyes:       Negative for acute change in vision  Respiratory: Negative for chest tightness, shortness of breath and wheezing.   Cardiovascular: Negative for chest pain.  Gastrointestinal: Negative for diarrhea, nausea and vomiting.  Genitourinary: Negative for dysuria, pelvic pain and vaginal discharge.  Musculoskeletal: Negative for neck pain and neck stiffness.  Skin: Negative for rash.  Neurological: Negative for seizures, syncope, weakness and headaches.  Hematological: Negative for adenopathy. Does not bruise/bleed easily.  Psychiatric/Behavioral: Negative for hallucinations.      Objective:    Wt 152 lb (68.9 kg)   BMI 23.46 kg/m  Nursing note and vital signs reviewed.  Physical Exam Constitutional:      General: She is not in acute distress.    Appearance: She is well-developed.  Eyes:     Conjunctiva/sclera: Conjunctivae normal.  Cardiovascular:     Rate and Rhythm: Normal rate and regular rhythm.     Heart sounds: Normal heart sounds. No murmur heard. No friction rub. No gallop.   Pulmonary:     Effort: Pulmonary effort is normal. No respiratory distress.     Breath sounds: Normal breath sounds. No wheezing or rales.  Chest:     Chest wall: No tenderness.  Abdominal:     General: Bowel sounds are normal.     Palpations: Abdomen is soft.     Tenderness: There is no abdominal tenderness.  Musculoskeletal:     Cervical back: Neck supple.  Lymphadenopathy:     Cervical: No cervical adenopathy.  Skin:    General: Skin is warm and dry.     Findings: No rash.  Neurological:     Mental Status: She is alert and oriented to person, place, and time.  Psychiatric:        Mood and Affect:  Mood normal.      Depression screen [facility] 2/9 06/08/2020 05/13/2020 04/13/2020 03/08/2020 02/19/2020  Decreased Interest 0 0 1 0 0  Down, Depressed, Hopeless 0 1 1 0 1  PHQ - 2 Score 0 1 2 0 1  Altered sleeping - 1 1 - 1  Tired, decreased energy - 1 1 - 1  Change in appetite - 1 1 - 0  Feeling bad or failure about yourself  - 1 2 - 1  Trouble concentrating - 1 2 - 1  Moving slowly or fidgety/restless - 0 0 - 0  Suicidal thoughts - 0 0 - 0  PHQ-9 Score - 6 9 - 5  Difficult doing work/chores - - - - Not difficult at all  Some recent  data might be hidden       Assessment & Plan:    Patient Active Problem List   Diagnosis Date Noted  . 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 Screening for cervical cancer 04/21/2020  . Screening for STDs (sexually transmitted diseases) 04/21/2020  . Costochondritis 02/19/2020  . Bilateral hip pain 02/19/2020  . Elbow pain, right 12/04/2019  . Housing problems 11/23/2019  . Mental health disorder 10/12/2019  . 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  . Paresthesias 10/12/2019  . Healthcare maintenance 08/05/2019  . HIV (human immunodeficiency virus infection) (HCC) 12/31/2018  . Anxiety 05/29/2017  . Self-mutilation 05/29/2017  . Depression 06/07/2016  . Marijuana use 06/07/2016  . Low vitamin D level 03/15/2016  . Prediabetes 02/22/2016  . Asthma 02/22/2016  . ADD (attention deficit disorder) 02/22/2016  . Methamphetamine use disorder, severe, 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/22/2016  . Hyperlipidemia with target LDL less than 70 01/31/2012  . Migraine 01/31/2012  . 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  She continues to complete her acyclovir and reports her symptoms are improving slowly.   Discussed the nature of herpes and varicella.  Continue to monitor.        Other   HIV (human immunodeficiency virus infection) (HCC) (Chronic)    Kristin Hart continues to have well-controlled HIV disease with good adherence and tolerance to her ART regimen of Biktarvy.  No signs/symptoms of opportunistic infection or progressive HIV.  We reviewed previous lab work and discussed plan of care.  Continue current dose of Biktarvy.  Plan for follow-up in 1 month or sooner if needed.      Healthcare maintenance     Discussed importance of safe sexual practice to reduce risk of STI.  Condoms provided.  Declines immunizations.          I am having Alvan Dame. Jagger maintain her ibuprofen, mupirocin ointment, hydrocortisone cream, diclofenac Sodium, naproxen, topiramate, bictegravir-emtricitabine-tenofovir AF, omeprazole, albuterol, cetirizine, acyclovir, and fluconazole.   Follow-up: Return in about 1 month (around 07/09/2020), or if symptoms worsen or fail to improve.   Marcos Eke, MSN, FNP-C Nurse Practitioner [facility] Main number: [phone]

## 2020-06-30 ENCOUNTER — Ambulatory Visit: Payer: Medicaid Other | Admitting: Family

## 2020-06-30 ENCOUNTER — Ambulatory Visit: Payer: Medicaid Other

## 2020-07-03 ENCOUNTER — Other Ambulatory Visit: Payer: Self-pay | Admitting: Family Medicine

## 2020-07-03 DIAGNOSIS — G43009 Migraine without aura, not intractable, without status migrainosus: Secondary | ICD-10-CM

## 2020-07-05 ENCOUNTER — Ambulatory Visit: Payer: Medicaid Other

## 2020-07-12 ENCOUNTER — Ambulatory Visit: Payer: Medicaid Other

## 2020-07-12 ENCOUNTER — Other Ambulatory Visit: Payer: Self-pay

## 2020-07-13 ENCOUNTER — Ambulatory Visit (INDEPENDENT_AMBULATORY_CARE_PROVIDER_SITE_OTHER): Payer: Medicaid Other | Admitting: Family Medicine

## 2020-07-13 ENCOUNTER — Encounter: Payer: Self-pay | Admitting: Family Medicine

## 2020-07-13 ENCOUNTER — Other Ambulatory Visit: Payer: Self-pay

## 2020-07-13 VITALS — BP 145/80 | HR 85 | Ht 67.5 in | Wt 158.2 lb

## 2020-07-13 DIAGNOSIS — M25552 Pain in left hip: Secondary | ICD-10-CM | POA: Diagnosis not present

## 2020-07-13 DIAGNOSIS — G43009 Migraine without aura, not intractable, without status migrainosus: Secondary | ICD-10-CM

## 2020-07-13 DIAGNOSIS — M25551 Pain in right hip: Secondary | ICD-10-CM | POA: Diagnosis not present

## 2020-07-13 MED ORDER — TOPIRAMATE 25 MG PO TABS: 25.0000 mg | ORAL_TABLET | Freq: Every day | ORAL | 1 refills | Status: DC

## 2020-07-13 MED ORDER — NAPROXEN 500 MG PO TABS: 500.0000 mg | ORAL_TABLET | Freq: Two times a day (BID) | ORAL | 0 refills | Status: DC

## 2020-07-13 MED ORDER — CYCLOBENZAPRINE HCL 5 MG PO TABS: 5.0000 mg | ORAL_TABLET | Freq: Every day | ORAL | 0 refills | Status: DC

## 2020-07-13 MED ORDER — GABAPENTIN 100 MG PO CAPS: 100.0000 mg | ORAL_CAPSULE | Freq: Two times a day (BID) | ORAL | 0 refills | Status: DC

## 2020-07-13 NOTE — Progress Notes (Signed)
SUBJECTIVE:   CHIEF COMPLAINT / HPI:   Left Hip Pain, severe: Patient has a history of bilateral hip pains for which she has been seen several times.  She reports that she has had hip pain on and off for the past 3 years, most recently has switched that her left hip is more painful than her right. She denies any recent falls or accidents that have started her left hip pain.  She denies any redness, swelling, bruising.  Reports that her hip pain is worse with ambulation.  She reports that the pain "feels like fireworks" and feels as if it is deep inside her left hip.  She also feels significant tenderness in her lateral trochanter as well as in the lateral aspect of her thigh to palpation.  She denies loss of sensation, swelling in the left leg.  Patient was most recently seen in February 2022 at infectious disease with concerns for hip pain.  Pap smear and STI testing at the time to rule out internal pelvic/adnexal cause of her pain was negative for STI, Pap was positive for ASCUS.  Hip x-ray December 2021 negative for OA or other MSK cause of hip pain. Patient prescribed naproxen 500 mg twice daily and instructed to apply Voltaren gel upwards of 4 times daily as needed for hip pain.  Referral for physical therapy was placed by PCP earlier in February 2022, however patient has not yet gone.  Patient also seen in December 2021 with similar presentation at today's visit 5/6.  At the time she reported sharp/achy discomfort sometimes in the buttock that usually comes from the inside of her hip.  At that time pain was bothering her more on the right hip than the left.  Denied radiation of her pain.  Denied shooting pains going down her leg into her foot.    PERTINENT  PMH / PSH:  Patient Active Problem List   Diagnosis Date Noted  . Left hip pain 07/17/2020  . 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 Screening for cervical cancer 04/21/2020  . Screening for STDs (sexually  transmitted diseases) 04/21/2020  . Costochondritis 02/19/2020  . Bilateral hip pain 02/19/2020  . Elbow pain, right 12/04/2019  . Housing problems 11/23/2019  . Mental health disorder 10/12/2019  . 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  . Paresthesias 10/12/2019  . Healthcare maintenance 08/05/2019  . HIV (human immunodeficiency virus infection) ([place]) 12/31/2018  . Anxiety 05/29/2017  . Self-mutilation 05/29/2017  . Depression 06/07/2016  . Marijuana use 06/07/2016  . Low vitamin D level 03/15/2016  . Prediabetes 02/22/2016  . Asthma 02/22/2016  . ADD (attention deficit disorder) 02/22/2016  . Methamphetamine use disorder, severe, dependence ([place]) 02/22/2016  . Hyperlipidemia with target LDL less than 70 01/31/2012  . Migraine 01/31/2012  . Tremor, 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     OBJECTIVE:   BP (!) 145/80   Pulse 85   Ht 5' 7.5" (1.715 m)   Wt 158 lb 3.2 oz (71.8 kg)   SpO2 98%   BMI 24.41 kg/m    Physical exam: General: tearful on exam, begins to fall asleep during second half of evaluation, easily awoken, nontoxic appearing Respiratory: CTA bilaterally, comfortable work of breathing Left Hip:  - Inspection: No gross deformity, no swelling, erythema, or ecchymosis - Palpation: Significant TTP over greater trochanter, glute and IT band extending to mid lateral thigh - ROM: mildly limited ROM in Flexion, extension, abduction, internal and external rotation - Strength: 4/5 secondary  to tenderness/pain - Neuro/vasc: NV intact distally - Special Tests: Pain with FABER and FADIR.  Negative Log roll. Did not assess Scour, Trendelenberg, or Ober's.   ASSESSMENT/PLAN:   Left hip pain Patient with history of multiple body aches, more recently has been experiencing hip pains.  Today left hip pain is greater than that of right side.  Patient with significant tenderness to palpation to left greater trochanter as well as to left IT band.  Greater trochanter bursitis versus IT band  syndrome.  Patient compares sensation of discomfort in her hip to "fireworks".  Previous x-rays of hip are negative for acute OA.  Patient denies any recent traumas or falls that contributed to her hip pain.  No erythema appreciated to left hip/left lower extremity concerning for septic joint. -Patient given Rx for Flexeril/Cyclobenzaprine 13m tablet (muscle relaxer). Told to take 1 tablet at night before bed time.  -Patient prescribed Gabapentin 1065mtwice daily for nerve pain to trial. -Take Naproxen only 1 tablet of 50015mwice daily.  -Instructed to get in touch with Physical Therapy to see about scheduling an appointment with them. -Labs such as ESR (nml at 12), CRP (nml at 2), CBC and BMP.  -MRI of left hip ordered, scheduled 5/14. -Can follow up as needed     Kristin Hart

## 2020-07-13 NOTE — Patient Instructions (Signed)
Thank you for coming in to see [place] today! Please see below to review our plan for today's visit:  1. Flexeril/Cyclobenzaprine 5mg  tablet (muscle relaxer). Take 1 tablet at night before bed time.  2. I have also prescribed Gabapentin 100mg  twice daily for nerve pain. Try this to see how you like it.  3. Also take Naproxen only 1 tablet of 500mg  twice daily.  4. Please get in touch with Physical Therapy to see about scheduling an appointment with them.  5. We are checking labs on you to see if there is any obvious cause of your symptoms.  6. We have ordered an MRI of your left hip. Please go to this appointment when scheduled  If you develop shortness of breath, nausea, vomiting, fever, skin changes/rashes, or worsening hip pain please be seen.   Please call the clinic at [phone] if your symptoms worsen or you have any concerns. It was our pleasure to serve you!   Dr. [facility]   Hip Bursitis  Hip bursitis is the inflammation of one or more bursae in the hip joint. Bursae are small fluid-filled sacs that absorb shock and prevent bones from rubbing against each other. Hip bursitis can cause mild to moderate pain, and symptoms often come and go over time. What are the causes? This condition results from increased friction between the hip bones and the tendons around the hip joint. This condition can happen if you:  Overuse your hip muscles.  Injure your hip.  Have weak buttocks muscles.  Have bone spurs.  Have an infection. In some cases, the cause may not be known. What increases the risk? You are more likely to develop this condition if:  You injured your hip previously or had hip surgery.  You have a medical condition, such as arthritis, gout, diabetes, or thyroid disease.  You have spine problems.  You have one leg that is shorter than the other.  You participate in athletic activities that include repetitive motion, like  running.  You participate in sports where there is a risk of injury or falling, such as football, martial arts, or skiing. What are the signs or symptoms? Symptoms may come and go, and they often include:  Pain in the hip or groin area. Pain may get worse with movement.  Tenderness and swelling of the hip. In rare cases, the bursa may become infected. If this happens, you may get a fever, as well as warmth and redness in the hip area. How is this diagnosed? This condition may be diagnosed based on:  Your symptoms.  Your medical history.  A physical exam.  Imaging tests, such as: ? X-rays to check your bones. ? MRI or ultrasound to check your tendons and muscles. ? Bone scan.  A biopsy to remove fluid from your inflamed bursa for testing. How is this treated? This condition is treated by resting, icing, applying pressure (compression), and raising (elevating) the injured area. This is called RICE treatment. In some cases, RICE treatment may not be enough to make your symptoms go away. Treatment may also include:  Taking medicine to help with swelling and pain.  Using crutches, a cane, or a walker to decrease the strain on your hip.  Getting a shot of cortisone medicine to help reduce swelling.  Taking other medicines if the bursa is infected.  Draining fluid out of the bursa to help relieve swelling.  Having surgery to remove a damaged or infected bursa. This  is rare. Long-term treatment may include:  Physical therapy exercises for strength and flexibility.  Lifestyle changes, such as weight loss, to reduce the strain on the hip. Follow these instructions at home: Managing pain, stiffness, and swelling  If directed, put ice on the painful area. ? Put ice in a plastic bag. ? Place a towel between your skin and the bag. ? Leave the ice on for 20 minutes, 2-3 times a day.  Raise (elevate) your hip as much as you can without pain. To do this, put a pillow under your  hips while you lie down.  If directed, apply heat to the affected area as often as told by your health care provider. Use the heat source that your health care provider recommends, such as a moist heat pack or a heating pad. ? Place a towel between your skin and the heat source. ? Leave the heat on for 20-30 minutes. ? Remove the heat if your skin turns bright red. This is especially important if you are unable to feel pain, heat, or cold. You may have a greater risk of getting burned.      Activity  Do not use your hip to support your body weight until your health care provider says that you can. Use crutches, a cane, or a walker as told by your health care provider.  If the affected leg is one that you use to drive, ask your health care provider if it is safe to drive.  Rest and protect your hip as much as possible until your pain and swelling get better.  Return to your normal activities as told by your health care provider. Ask your health care provider what activities are safe for you.  Do exercises as told by your health care provider. General instructions  Take over-the-counter and prescription medicines only as told by your health care provider.  Gently massage and stretch your injured area as often as is comfortable.  Wear compression wraps only as told by your health care provider.  If one of your legs is shorter than the other, get fitted for a shoe insert or orthotic.  Maintain a healthy weight. Follow instructions from your health care provider for weight control. These may include dietary restrictions.  Keep all follow-up visits as told by your health care provider. This is important. How is this prevented?  Exercise regularly, as told by your health care provider.  Wear supportive footwear that is appropriate for your sport.  Warm up and stretch before being active. Cool down and stretch after being active.  Take breaks regularly from repetitive activity.  If  an activity irritates your hip or causes pain, avoid the activity as much as possible.  Avoid sitting down for long periods at a time. Where to find more information  American Academy of Orthopaedic Surgeons: orthoinfo.aaos.org Contact a health care provider if:  You have a fever.  You develop new symptoms.  You have trouble walking or doing everyday activities.  You have pain that gets worse or does not get better with medicine.  You develop red skin or a feeling of warmth in your hip area. Get help right away if:  You cannot move your hip.  You have severe pain.  You cannot control the muscles in your feet. Summary  Hip bursitis is the inflammation of one or more bursae in the hip joint. Bursae are small fluid-filled sacs that absorb shock and prevent bones from rubbing against each other.  Hip bursitis  can cause hip or groin pain, and symptoms often come and go over time.  This condition is often treated by resting, icing, applying pressure (compression), and raising (elevating) the injured area. Other treatments may be needed. This information is not intended to replace advice given to you by your health care provider. Make sure you discuss any questions you have with your health care provider. Document Revised: 12/29/2018 Document Reviewed: 11/04/2017 Elsevier Patient Education  2021 [employer].

## 2020-07-14 ENCOUNTER — Other Ambulatory Visit: Payer: Self-pay | Admitting: Family

## 2020-07-14 DIAGNOSIS — K219 Gastro-esophageal reflux disease without esophagitis: Secondary | ICD-10-CM

## 2020-07-14 LAB — CBC
Hematocrit: 33.4 % — ABNORMAL LOW (ref 34.0–46.6)
Hemoglobin: 10.4 g/dL — ABNORMAL LOW (ref 11.1–15.9)
MCH: 24.9 pg — ABNORMAL LOW (ref 26.6–33.0)
MCHC: 31.1 g/dL — ABNORMAL LOW (ref 31.5–35.7)
MCV: 80 fL (ref 79–97)
Platelets: 254 10*3/uL (ref 150–450)
RBC: 4.17 x10E6/uL (ref 3.77–5.28)
RDW: 13.1 % (ref 11.7–15.4)
WBC: 6.1 10*3/uL (ref 3.4–10.8)

## 2020-07-14 LAB — BASIC METABOLIC PANEL
BUN/Creatinine Ratio: 20 (ref 9–23)
BUN: 15 mg/dL (ref 6–20)
CO2: 21 mmol/L (ref 20–29)
Calcium: 8.6 mg/dL — ABNORMAL LOW (ref 8.7–10.2)
Chloride: 100 mmol/L (ref 96–106)
Creatinine, Ser: 0.75 mg/dL (ref 0.57–1.00)
Glucose: 206 mg/dL — ABNORMAL HIGH (ref 65–99)
Potassium: 3.9 mmol/L (ref 3.5–5.2)
Sodium: 137 mmol/L (ref 134–144)
eGFR: 104 mL/min/{1.73_m2} (ref >59)

## 2020-07-14 LAB — SEDIMENTATION RATE: Sed Rate: 12 mm/hr (ref 0–32)

## 2020-07-14 LAB — C-REACTIVE PROTEIN: CRP: 2 mg/L (ref 0–10)

## 2020-07-17 DIAGNOSIS — M25552 Pain in left hip: Secondary | ICD-10-CM | POA: Insufficient documentation

## 2020-07-17 NOTE — Assessment & Plan Note (Addendum)
Patient with history of multiple body aches, more recently has been experiencing hip pains.  Today left hip pain is greater than that of right side.  Patient with significant tenderness to palpation to left greater trochanter as well as to left IT band.  Greater trochanter bursitis versus IT band syndrome.  Patient compares sensation of discomfort in her hip to "fireworks".  Previous x-rays of hip are negative for acute OA.  Patient denies any recent traumas or falls that contributed to her hip pain.  No erythema appreciated to left hip/left lower extremity concerning for septic joint. -Patient given Rx for Flexeril/Cyclobenzaprine 65m tablet (muscle relaxer). Told to take 1 tablet at night before bed time.  -Patient prescribed Gabapentin 1024mtwice daily for nerve pain to trial. -Take Naproxen only 1 tablet of 50078mwice daily.  -Instructed to get in touch with Physical Therapy to see about scheduling an appointment with them. -Labs such as ESR (nml at 12), CRP (nml at 2), CBC and BMP.  -MRI of left hip ordered, scheduled 5/14. -Can follow up as needed

## 2020-07-23 ENCOUNTER — Other Ambulatory Visit: Payer: Self-pay

## 2020-07-23 ENCOUNTER — Ambulatory Visit
Admission: RE | Admit: 2020-07-23 | Discharge: 2020-07-23 | Disposition: A | Discharge status: Home / Self Care | Payer: Medicaid Other | Source: Ambulatory Visit | Attending: Family Medicine | Admitting: Family Medicine

## 2020-07-23 DIAGNOSIS — M25552 Pain in left hip: Secondary | ICD-10-CM

## 2020-07-29 ENCOUNTER — Encounter: Payer: Self-pay | Admitting: Family

## 2020-07-29 ENCOUNTER — Other Ambulatory Visit (HOSPITAL_COMMUNITY)
Admission: RE | Admit: 2020-07-29 | Discharge: 2020-07-29 | Disposition: A | Discharge status: Home / Self Care | Payer: Medicaid Other | Source: Ambulatory Visit | Attending: Family | Admitting: Family

## 2020-07-29 ENCOUNTER — Ambulatory Visit (INDEPENDENT_AMBULATORY_CARE_PROVIDER_SITE_OTHER): Payer: Medicaid Other | Admitting: Family

## 2020-07-29 ENCOUNTER — Other Ambulatory Visit: Payer: Self-pay

## 2020-07-29 VITALS — BP 133/80 | HR 107 | Temp 98.3°F

## 2020-07-29 DIAGNOSIS — Z113 Encounter for screening for infections with a predominantly sexual mode of transmission: Secondary | ICD-10-CM

## 2020-07-29 DIAGNOSIS — F1520 Other stimulant dependence, uncomplicated: Secondary | ICD-10-CM

## 2020-07-29 DIAGNOSIS — Z21 Asymptomatic human immunodeficiency virus [HIV] infection status: Secondary | ICD-10-CM | POA: Diagnosis not present

## 2020-07-29 DIAGNOSIS — M25552 Pain in left hip: Secondary | ICD-10-CM | POA: Diagnosis not present

## 2020-07-29 DIAGNOSIS — Z30013 Encounter for initial prescription of injectable contraceptive: Secondary | ICD-10-CM

## 2020-07-29 DIAGNOSIS — Z0000 Encounter for general adult medical examination without abnormal findings: Secondary | ICD-10-CM | POA: Diagnosis not present

## 2020-07-29 DIAGNOSIS — Z3042 Encounter for surveillance of injectable contraceptive: Secondary | ICD-10-CM | POA: Insufficient documentation

## 2020-07-29 LAB — POCT URINE PREGNANCY: Preg Test, Ur: NEGATIVE

## 2020-07-29 MED ORDER — BICTEGRAVIR-EMTRICITAB-TENOFOV 50-200-25 MG PO TABS: 1.0000 | ORAL_TABLET | Freq: Every day | ORAL | 3 refills | Status: DC

## 2020-07-29 MED ORDER — MEDROXYPROGESTERONE ACETATE 150 MG/ML IM SUSP
150.0000 mg | Freq: Once | INTRAMUSCULAR | Status: AC
  Administered 2020-07-29: 150 mg via INTRAMUSCULAR

## 2020-07-29 NOTE — Assessment & Plan Note (Signed)
Ms. Barbour was outside the window for continued injection with Depo-Provera.  Urine pregnancy test negative.  Depo-Provera provided.

## 2020-07-29 NOTE — Assessment & Plan Note (Signed)
Kristin Hart continues to have well-controlled HIV disease with good adherence and tolerance to her ART regimen of Biktarvy.  No signs/symptoms of opportunistic infection.  Attempt was made to draw blood work today but was unable to be successfully completed.  Plan for checking blood work at next office visit if able.  Continue current dose of Biktarvy.  Plan for follow-up in 1 month or sooner if needed.

## 2020-07-29 NOTE — Patient Instructions (Addendum)
Nice to see you.  We will check your lab work today.  Continue to take your medications daily as prescribed.   Refills have been sent to the pharmacy.  Plan for follow up in 1 month or sooner if needed.   Hope your hip is feeling better.  Have a great day and stay safe!

## 2020-07-29 NOTE — Progress Notes (Signed)
Patient ID: Kristin Hart, female    DOB: 10-16-1980, 40 y.o.   MRN: [id number]    Subjective:    Chief Complaint  Patient presents with  . Follow-up    Given condoms      HPI:  Kristin Hart is a 40 y.o. female with HIV disease last seen on 06/08/2020 with well-controlled virus and good adherence and tolerance to her ART regimen of Biktarvy.  Last blood work drawn on 04/14/2020 with viral load that was undetectable and CD4 count of 1113.  Here today for routine follow-up.  Kristin Hart has been taking her Biktarvy daily as prescribed with no adverse side effects or missed doses since her last office visit.  She is having significantly increased left hip pain and is currently awaiting MRI as ordered by her primary care team.  Also having a cough that has been going on for several days.  Tested negative for COVID. Denies fevers, chills, night sweats, headaches, changes in vision, neck pain/stiffness, nausea, diarrhea, vomiting, lesions or rashes.  Kristin Hart has no problems obtaining her medication from the pharmacy and remains covered through [facility].  Denies feelings of being down, depressed, or hopeless.  Continues to use marijuana and meth daily.  No tobacco or alcohol consumption.  Condoms provided.  Requesting to restart Depo Provera for birth control.   No Known Allergies    Outpatient Medications Prior to Visit  Medication Sig Dispense Refill  . albuterol (PROAIR HFA) 108 (90 Base) MCG/ACT inhaler Inhale 1 puff into the lungs every 4 (four) hours as needed. 18 g 2  . cetirizine (ZYRTEC) 10 MG tablet Take 1 tablet (10 mg total) by mouth daily. 30 tablet 11  . cyclobenzaprine (FLEXERIL) 5 MG tablet Take 1 tablet (5 mg total) by mouth at bedtime. 10 tablet 0  . diclofenac Sodium (VOLTAREN) 1 % GEL Apply 2 g topically 4 (four) times daily as needed (for elbow pain). As needed for hip pain. 2 g 3  . hydrocortisone cream 1 % Apply to affected area 2 times daily 15 g 0  . ibuprofen  (ADVIL,MOTRIN) 600 MG tablet Take 1 tablet (600 mg total) by mouth every 6 (six) hours as needed. (Patient taking differently: Take 800 mg by mouth every 6 (six) hours as needed.) 30 tablet 0  . mupirocin ointment (BACTROBAN) 2 %   0  . naproxen (NAPROSYN) 500 MG tablet Take 1 tablet (500 mg total) by mouth 2 (two) times daily with a meal. 30 tablet 0  . omeprazole (PRILOSEC) 40 MG capsule TAKE 1 CAPSULE BY MOUTH EVERY DAY 30 capsule 1  . topiramate (TOPAMAX) 25 MG tablet Take 1 tablet (25 mg total) by mouth daily. 30 tablet 1  . bictegravir-emtricitabine-tenofovir AF (BIKTARVY) 50-200-25 MG TABS tablet Take 1 tablet by mouth daily. 30 tablet 3  . gabapentin (NEURONTIN) 100 MG capsule Take 1 capsule (100 mg total) by mouth 2 (two) times daily. (Patient not taking: Reported on 07/29/2020) 30 capsule 0   No facility-administered medications prior to visit.     Past Medical History:  Diagnosis Date  . Asthma   . 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 Diabetes mellitus without complication (HCC)    type 2  . Foreign body in small intestine   . HIV (human immunodeficiency virus infection) (HCC)   . Hypothyroidism   . Stomach ulcer      Past Surgical History:  Procedure Laterality Date  .  CESAREAN SECTION N/A 07/04/2016   Procedure: CESAREAN SECTION;  Surgeon: [name], DO;  Location: [facility] BIRTHING SUITES;  Service: Obstetrics;  Laterality: N/A;  . ENTEROSCOPY N/A 11/08/2017   Procedure: ENTEROSCOPY;  Surgeon: Hilarie Fredrickson, MD;  Location: [facility] ENDOSCOPY;  Service: Endoscopy;  Laterality: N/A;  . NO PAST SURGERIES         Review of Systems  Constitutional: Negative for appetite change, chills, diaphoresis, fatigue, fever and unexpected weight change.  Eyes:       Negative for acute change in vision  Respiratory: Negative for chest tightness, shortness of breath and wheezing.   Cardiovascular: Negative for chest pain.  Gastrointestinal:  Negative for diarrhea, nausea and vomiting.  Genitourinary: Negative for dysuria, pelvic pain and vaginal discharge.  Musculoskeletal: Negative for neck pain and neck stiffness.  Skin: Negative for rash.  Neurological: Negative for seizures, syncope, weakness and headaches.  Hematological: Negative for adenopathy. Does not bruise/bleed easily.  Psychiatric/Behavioral: Negative for hallucinations.      Objective:    BP 133/80   Pulse (!) 107   Temp 98.3 F (36.8 C) (Oral)   SpO2 97%  Nursing note and vital signs reviewed.  Physical Exam Constitutional:      General: She is not in acute distress.    Appearance: She is well-developed.  Eyes:     Conjunctiva/sclera: Conjunctivae normal.  Cardiovascular:     Rate and Rhythm: Normal rate and regular rhythm.     Heart sounds: Normal heart sounds. No murmur heard. No friction rub. No gallop.   Pulmonary:     Effort: Pulmonary effort is normal. No respiratory distress.     Breath sounds: Normal breath sounds. No wheezing or rales.  Chest:     Chest wall: No tenderness.  Abdominal:     General: Bowel sounds are normal.     Palpations: Abdomen is soft.     Tenderness: There is no abdominal tenderness.  Musculoskeletal:     Cervical back: Neck supple.  Lymphadenopathy:     Cervical: No cervical adenopathy.  Skin:    General: Skin is warm and dry.     Findings: No rash.  Neurological:     Mental Status: She is alert and oriented to person, place, and time.  Psychiatric:        Behavior: Behavior normal.        Thought Content: Thought content normal.        Judgment: Judgment normal.      Depression screen [facility] 2/9 07/29/2020 07/13/2020 07/13/2020 06/08/2020 05/13/2020  Decreased Interest 0 3 3 0 0  Down, Depressed, Hopeless 0 3 3 0 1  PHQ - 2 Score 0 6 6 0 1  Altered sleeping - 0 0 - 1  Tired, decreased energy - 3 3 - 1  Change in appetite - 0 0 - 1  Feeling bad or failure about yourself  - 0 0 - 1  Trouble concentrating - 0  0 - 1  Moving slowly or fidgety/restless - 0 0 - 0  Suicidal thoughts - 0 0 - 0  PHQ-9 Score - 9 9 - 6  Difficult doing work/chores - Somewhat difficult Somewhat difficult - -  Some recent data might be hidden       Assessment & Plan:    Patient Active Problem List   Diagnosis Date Noted  . Encounter for initial prescription of injectable contraceptive 07/29/2020  . Left hip pain 07/17/2020  . 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 Screening for cervical cancer 04/21/2020  . Screening for STDs (sexually transmitted diseases) 04/21/2020  . Costochondritis 02/19/2020  . Bilateral hip pain 02/19/2020  . Elbow pain, right 12/04/2019  . Housing problems 11/23/2019  . Mental health disorder 10/12/2019  . 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  . Paresthesias 10/12/2019  . Healthcare maintenance 08/05/2019  . HIV (human immunodeficiency virus infection) (HCC) 12/31/2018  . Anxiety 05/29/2017  . Self-mutilation 05/29/2017  . Depression 06/07/2016  . Marijuana use 06/07/2016  . Low vitamin D level 03/15/2016  . Prediabetes 02/22/2016  . Asthma 02/22/2016  . ADD (attention deficit disorder) 02/22/2016  . Methamphetamine use disorder, severe, 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/22/2016  . Hyperlipidemia with target LDL less than 70 01/31/2012  . Migraine 01/31/2012  . Tremor, 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     Problem List Items Addressed This Visit      Other   HIV (human immunodeficiency virus infection) (HCC) - Primary (Chronic)    Ms. Degraffenreid continues to have well-controlled HIV disease with good adherence and tolerance to her ART regimen of Biktarvy.  No signs/symptoms of opportunistic infection.  Attempt was made to draw blood work today but was unable to be successfully completed.  Plan for checking blood work at next office visit if able.  Continue current dose of Biktarvy.  Plan for follow-up in 1 month or sooner if needed.      Relevant Medications    bictegravir-emtricitabine-tenofovir AF (BIKTARVY) 50-200-25 MG TABS tablet   Other Relevant Orders   T-helper cells (CD4) count   HIV-1 RNA quant-no reflex-bld   Methamphetamine use disorder, severe, dependence (HCC)    Ms. Jarnagin continues to use methamphetamine and marijuana on a daily basis.  Discussed importance of trying to stop drug use as it carries significant risks. Continue to monitor.       Healthcare maintenance     Discussed importance of safe sexual practice to reduce risk of STI.  Condoms provided.      Screening for STDs (sexually transmitted diseases)   Relevant Orders   Urine cytology ancillary only([place])   RPR   Left hip pain    Ms. Sarff continues to have poorly controlled left hip pain and is awaiting MRI ordered by primary care.  Discussed nonpharmacological methods of pain control.      Encounter for initial prescription of injectable contraceptive    Ms. Akerson was outside the window for continued injection with Depo-Provera.  Urine pregnancy test negative.  Depo-Provera provided.      Relevant Orders   POCT urine pregnancy (Completed)       I am having Alvan Dame. [place] maintain her ibuprofen, mupirocin ointment, hydrocortisone cream, diclofenac Sodium, albuterol, cetirizine, cyclobenzaprine, gabapentin, topiramate, naproxen, omeprazole, and bictegravir-emtricitabine-tenofovir AF. We administered medroxyPROGESTERone.   Meds ordered this encounter  Medications  . bictegravir-emtricitabine-tenofovir AF (BIKTARVY) 50-200-25 MG TABS tablet    Sig: Take 1 tablet by mouth daily.    Dispense:  30 tablet    Refill:  3    Order Specific Question:   Supervising Provider    Answer:   Judyann Munson [[id number]]  . medroxyPROGESTERone (DEPO-PROVERA) injection 150 mg     Follow-up: Return in about 1 month (around 08/29/2020).   Marcos Eke, MSN, FNP-C Nurse Practitioner [facility] for Infectious Disease [facility] Main number:  [phone]

## 2020-07-29 NOTE — Assessment & Plan Note (Signed)
Kristin Hart continues to have poorly controlled left hip pain and is awaiting MRI ordered by primary care.  Discussed nonpharmacological methods of pain control.

## 2020-07-29 NOTE — Assessment & Plan Note (Signed)
Ms. Pluta continues to use methamphetamine and marijuana on a daily basis.  Discussed importance of trying to stop drug use as it carries significant risks. Continue to monitor.

## 2020-07-29 NOTE — Assessment & Plan Note (Signed)
   Discussed importance of safe sexual practice to reduce risk of STI. Condoms provided.  

## 2020-08-01 LAB — URINE CYTOLOGY ANCILLARY ONLY
Chlamydia: NEGATIVE
Comment: NEGATIVE
Comment: NORMAL
Neisseria Gonorrhea: NEGATIVE

## 2020-08-05 ENCOUNTER — Ambulatory Visit: Payer: Medicaid Other | Admitting: Family Medicine

## 2020-08-11 ENCOUNTER — Encounter: Payer: Self-pay | Admitting: Physical Medicine and Rehabilitation

## 2020-08-16 ENCOUNTER — Ambulatory Visit: Payer: Medicaid Other

## 2020-08-29 ENCOUNTER — Encounter: Payer: Self-pay | Admitting: Family

## 2020-08-29 ENCOUNTER — Ambulatory Visit (INDEPENDENT_AMBULATORY_CARE_PROVIDER_SITE_OTHER): Payer: Medicaid Other | Admitting: Family

## 2020-08-29 ENCOUNTER — Other Ambulatory Visit: Payer: Self-pay

## 2020-08-29 VITALS — BP 160/101 | HR 100 | Temp 97.9°F | Wt 153.0 lb

## 2020-08-29 DIAGNOSIS — Z21 Asymptomatic human immunodeficiency virus [HIV] infection status: Secondary | ICD-10-CM

## 2020-08-29 DIAGNOSIS — Z0000 Encounter for general adult medical examination without abnormal findings: Secondary | ICD-10-CM | POA: Diagnosis not present

## 2020-08-29 DIAGNOSIS — F4321 Adjustment disorder with depressed mood: Secondary | ICD-10-CM | POA: Diagnosis not present

## 2020-08-29 DIAGNOSIS — Z599 Problem related to housing and economic circumstances, unspecified: Secondary | ICD-10-CM

## 2020-08-29 NOTE — Assessment & Plan Note (Signed)
Kristin Hart is experiencing acute onset grief starting approximately 3 weeks ago when she found her roommate deceased after accidental drug overdose.  Currently grieving with no suicidal ideations.  Discussed importance of the grief process and the availability of counseling if necessary.  Does not appear to be complicated at the present time.  Continue to monitor.

## 2020-08-29 NOTE — Assessment & Plan Note (Signed)
Kristin Hart informed me that she is likely to be evicted from her house in the coming future with concern of no place to go.  Have advised her to contact [facility] and will work to find other community resources to aid in her finding shelter and providing basic needs.

## 2020-08-29 NOTE — Assessment & Plan Note (Signed)
   Discussed importance of safe sexual practice to reduce risk of STI. Condoms provided.  

## 2020-08-29 NOTE — Progress Notes (Signed)
Patient ID: Kristin Hart, female    DOB: [birth year], 40 y.o.   MRN: [id number]  Subjective:    Chief Complaint  Patient presents with   Follow-up    1 month follow up , having  pain in hip    HPI:  Kristin Hart is a 40 y.o. female with HIV disease last seen on 07/29/2020 with well-controlled virus and good adherence and tolerance to her ART regimen of Biktarvy.  No blood work was able to be drawn.  Here today for routine follow-up.  Kristin Hart has been taking her Biktarvy daily as prescribed with no adverse side effects or missed doses since her last office visit.  She is feeling sad today as approximately 3 weeks ago she found a roommate had had unintentionally overdosed on drugs and was found deceased.  She is also concerned about the possibility of developing COPD. Denies fevers, chills, night sweats, headaches, changes in vision, neck pain/stiffness, nausea, diarrhea, vomiting, lesions or rashes.  Kristin Hart has no problems obtaining medication from the pharmacy remains covered through [facility].  Continues to use methamphetamine with occasional marijuana.  Has concerns about her current housing situation as there is a high likelihood she will be evicted along with her current cohabitant's.  Requesting to defer blood work today.  Having problems getting in touch with [facility] for assistance.   No Known Allergies    Outpatient Medications Prior to Visit  Medication Sig Dispense Refill   albuterol (PROAIR HFA) 108 (90 Base) MCG/ACT inhaler Inhale 1 puff into the lungs every 4 (four) hours as needed. 18 g 2   bictegravir-emtricitabine-tenofovir AF (BIKTARVY) 50-200-25 MG TABS tablet Take 1 tablet by mouth daily. 30 tablet 3   cetirizine (ZYRTEC) 10 MG tablet Take 1 tablet (10 mg total) by mouth daily. 30 tablet 11   cyclobenzaprine (FLEXERIL) 5 MG tablet Take 1 tablet (5 mg total) by mouth at bedtime. 10 tablet 0   diclofenac Sodium (VOLTAREN) 1 % GEL Apply 2 g  topically 4 (four) times daily as needed (for elbow pain). As needed for hip pain. 2 g 3   gabapentin (NEURONTIN) 100 MG capsule Take 1 capsule (100 mg total) by mouth 2 (two) times daily. 30 capsule 0   hydrocortisone cream 1 % Apply to affected area 2 times daily 15 g 0   ibuprofen (ADVIL,MOTRIN) 600 MG tablet Take 1 tablet (600 mg total) by mouth every 6 (six) hours as needed. (Patient taking differently: Take 800 mg by mouth every 6 (six) hours as needed.) 30 tablet 0   mupirocin ointment (BACTROBAN) 2 %   0   naproxen (NAPROSYN) 500 MG tablet Take 1 tablet (500 mg total) by mouth 2 (two) times daily with a meal. 30 tablet 0   omeprazole (PRILOSEC) 40 MG capsule TAKE 1 CAPSULE BY MOUTH EVERY DAY 30 capsule 1   topiramate (TOPAMAX) 25 MG tablet Take 1 tablet (25 mg total) by mouth daily. 30 tablet 1   No facility-administered medications prior to visit.     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, per pt   Diabetes mellitus without complication (HCC)    type 2   Foreign body in small intestine    HIV (human immunodeficiency virus infection) (HCC)    Hypothyroidism    Stomach ulcer      Past Surgical History:  Procedure Laterality Date  CESAREAN SECTION N/A 07/04/2016   Procedure: CESAREAN SECTION;  Surgeon: [name], DO;  Location: [facility] BIRTHING SUITES;  Service: Obstetrics;  Laterality: N/A;   ENTEROSCOPY N/A 11/08/2017   Procedure: ENTEROSCOPY;  Surgeon: Hilarie Fredrickson, MD;  Location: [facility] ENDOSCOPY;  Service: Endoscopy;  Laterality: N/A;   NO PAST SURGERIES         Review of Systems  Constitutional:  Negative for chills, diaphoresis, fatigue and fever.  Respiratory:  Negative for cough, chest tightness, shortness of breath and wheezing.   Cardiovascular:  Negative for chest pain.  Gastrointestinal:  Negative for abdominal pain, diarrhea, nausea and vomiting.  Psychiatric/Behavioral:   Positive for dysphoric mood.      Objective:    BP (!) 160/101   Pulse 100   Temp 97.9 F (36.6 C)   Wt 153 lb (69.4 kg)   SpO2 100%   BMI 23.61 kg/m  Nursing note and vital signs reviewed.  Physical Exam Constitutional:      General: She is not in acute distress.    Appearance: She is well-developed.  Eyes:     Conjunctiva/sclera: Conjunctivae normal.  Cardiovascular:     Rate and Rhythm: Normal rate and regular rhythm.     Heart sounds: Normal heart sounds. No murmur heard.   No friction rub. No gallop.  Pulmonary:     Effort: Pulmonary effort is normal. No respiratory distress.     Breath sounds: Normal breath sounds. No wheezing or rales.  Chest:     Chest wall: No tenderness.  Abdominal:     General: Bowel sounds are normal.     Palpations: Abdomen is soft.     Tenderness: There is no abdominal tenderness.  Musculoskeletal:     Cervical back: Neck supple.  Lymphadenopathy:     Cervical: No cervical adenopathy.  Skin:    General: Skin is warm and dry.     Findings: No rash.  Neurological:     Mental Status: She is alert and oriented to person, place, and time.  Psychiatric:        Mood and Affect: Affect is tearful.        Behavior: Behavior normal.        Thought Content: Thought content normal.        Judgment: Judgment normal.     Depression screen [facility] 2/9 07/29/2020 07/13/2020 07/13/2020 06/08/2020 05/13/2020  Decreased Interest 0 3 3 0 0  Down, Depressed, Hopeless 0 3 3 0 1  PHQ - 2 Score 0 6 6 0 1  Altered sleeping - 0 0 - 1  Tired, decreased energy - 3 3 - 1  Change in appetite - 0 0 - 1  Feeling bad or failure about yourself  - 0 0 - 1  Trouble concentrating - 0 0 - 1  Moving slowly or fidgety/restless - 0 0 - 0  Suicidal thoughts - 0 0 - 0  PHQ-9 Score - 9 9 - 6  Difficult doing work/chores - Somewhat difficult Somewhat difficult - -  Some recent data might be hidden       Assessment & Plan:    Patient Active Problem List   Diagnosis Date  Noted   Grief 08/29/2020   Encounter for initial prescription of injectable contraceptive 07/29/2020   Left hip pain 07/17/2020   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   Screening for cervical cancer 04/21/2020   Screening for STDs (sexually transmitted diseases) 04/21/2020   Costochondritis 02/19/2020  Bilateral hip pain 02/19/2020   Elbow pain, right 12/04/2019   Housing problems 11/23/2019   Mental health disorder 10/12/2019   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   Paresthesias 10/12/2019   Healthcare maintenance 08/05/2019   HIV (human immunodeficiency virus infection) (HCC) 12/31/2018   Anxiety 05/29/2017   Self-mutilation 05/29/2017   Depression 06/07/2016   Marijuana use 06/07/2016   Low vitamin D level 03/15/2016   Prediabetes 02/22/2016   Asthma 02/22/2016   ADD (attention deficit disorder) 02/22/2016   Methamphetamine use disorder, severe, 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/22/2016   Hyperlipidemia with target LDL less than 70 01/31/2012   Migraine 01/31/2012   Tremor, 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     Problem List Items Addressed This Visit       Other   HIV (human immunodeficiency virus infection) (HCC) - Primary (Chronic)    Ms. Teng continues to have well-controlled HIV disease with good adherence and tolerance to her ART regimen of Biktarvy.  No signs/symptoms of opportunistic infection or progressive HIV.  We reviewed previous lab work and discussed plan of care.  Check blood work at next office visit.  Continue current dose of Biktarvy.  Plan for follow-up in 1 month or sooner if needed with lab work on the same day.       Healthcare maintenance    Discussed importance of safe sexual practice to reduce risk of STI.  Condoms provided.       Housing problems    Ms. Babers informed me that she is likely to be evicted from her house in the coming future with concern of no place to go.  Have advised her to contact [facility] and will work to find  other community resources to aid in her finding shelter and providing basic needs.       Grief    Ms. Cuervo is experiencing acute onset grief starting approximately 3 weeks ago when she found her roommate deceased after accidental drug overdose.  Currently grieving with no suicidal ideations.  Discussed importance of the grief process and the availability of counseling if necessary.  Does not appear to be complicated at the present time.  Continue to monitor.         I am having Alvan Dame. [place] maintain her ibuprofen, mupirocin ointment, hydrocortisone cream, diclofenac Sodium, albuterol, cetirizine, cyclobenzaprine, gabapentin, topiramate, naproxen, omeprazole, and bictegravir-emtricitabine-tenofovir AF.  Follow-up: Return in about 1 month (around 09/28/2020), or if symptoms worsen or fail to improve.   Marcos Eke, MSN, FNP-C Nurse Practitioner [facility] for Infectious Disease [facility] Main number: [phone]

## 2020-08-29 NOTE — Assessment & Plan Note (Signed)
Kristin Hart continues to have well-controlled HIV disease with good adherence and tolerance to her ART regimen of Biktarvy.  No signs/symptoms of opportunistic infection or progressive HIV.  We reviewed previous lab work and discussed plan of care.  Check blood work at next office visit.  Continue current dose of Biktarvy.  Plan for follow-up in 1 month or sooner if needed with lab work on the same day.

## 2020-08-29 NOTE — Patient Instructions (Addendum)
Nice to see you.  We will continue to take your medication daily.  Refills are available at the pharmacy.  Recommend follow up with Marylu Lund.   We will work to get you in touch with [facility] and supporting resources.   Plan for follow up in 1 month or sooner if needed with lab work on the same day.  Hope your day gets better.

## 2020-09-05 ENCOUNTER — Emergency Department (HOSPITAL_COMMUNITY): Payer: Medicaid Other

## 2020-09-05 ENCOUNTER — Encounter (HOSPITAL_COMMUNITY): Payer: Self-pay | Admitting: Emergency Medicine

## 2020-09-05 ENCOUNTER — Other Ambulatory Visit: Payer: Self-pay

## 2020-09-05 ENCOUNTER — Emergency Department (HOSPITAL_COMMUNITY)
Admission: EM | Admit: 2020-09-05 | Discharge: 2020-09-05 | Disposition: A | Discharge status: Home / Self Care | Payer: Medicaid Other | Attending: Emergency Medicine | Admitting: Emergency Medicine

## 2020-09-05 DIAGNOSIS — Z21 Asymptomatic human immunodeficiency virus [HIV] infection status: Secondary | ICD-10-CM | POA: Insufficient documentation

## 2020-09-05 DIAGNOSIS — R059 Cough, unspecified: Secondary | ICD-10-CM | POA: Diagnosis not present

## 2020-09-05 DIAGNOSIS — G8929 Other chronic pain: Secondary | ICD-10-CM

## 2020-09-05 DIAGNOSIS — E039 Hypothyroidism, unspecified: Secondary | ICD-10-CM | POA: Diagnosis not present

## 2020-09-05 DIAGNOSIS — R0602 Shortness of breath: Secondary | ICD-10-CM | POA: Diagnosis not present

## 2020-09-05 DIAGNOSIS — J449 Chronic obstructive pulmonary disease, unspecified: Secondary | ICD-10-CM | POA: Diagnosis not present

## 2020-09-05 DIAGNOSIS — M5126 Other intervertebral disc displacement, lumbar region: Secondary | ICD-10-CM | POA: Insufficient documentation

## 2020-09-05 DIAGNOSIS — M5442 Lumbago with sciatica, left side: Secondary | ICD-10-CM | POA: Diagnosis not present

## 2020-09-05 DIAGNOSIS — M545 Low back pain, unspecified: Secondary | ICD-10-CM | POA: Diagnosis not present

## 2020-09-05 DIAGNOSIS — M25552 Pain in left hip: Secondary | ICD-10-CM | POA: Diagnosis not present

## 2020-09-05 DIAGNOSIS — J45909 Unspecified asthma, uncomplicated: Secondary | ICD-10-CM | POA: Insufficient documentation

## 2020-09-05 DIAGNOSIS — E119 Type 2 diabetes mellitus without complications: Secondary | ICD-10-CM | POA: Insufficient documentation

## 2020-09-05 DIAGNOSIS — M1612 Unilateral primary osteoarthritis, left hip: Secondary | ICD-10-CM | POA: Diagnosis not present

## 2020-09-05 LAB — CBC WITH DIFFERENTIAL/PLATELET
Abs Immature Granulocytes: 0.03 10*3/uL (ref 0.00–0.07)
Basophils Absolute: 0.1 10*3/uL (ref 0.0–0.1)
Basophils Relative: 1 %
Eosinophils Absolute: 0.6 10*3/uL — ABNORMAL HIGH (ref 0.0–0.5)
Eosinophils Relative: 8 %
HCT: 36.7 % (ref 36.0–46.0)
Hemoglobin: 11.6 g/dL — ABNORMAL LOW (ref 12.0–15.0)
Immature Granulocytes: 0 %
Lymphocytes Relative: 33 %
Lymphs Abs: 2.8 10*3/uL (ref 0.7–4.0)
MCH: 25.9 pg — ABNORMAL LOW (ref 26.0–34.0)
MCHC: 31.6 g/dL (ref 30.0–36.0)
MCV: 81.9 fL (ref 80.0–100.0)
Monocytes Absolute: 0.6 10*3/uL (ref 0.1–1.0)
Monocytes Relative: 7 %
Neutro Abs: 4.4 10*3/uL (ref 1.7–7.7)
Neutrophils Relative %: 51 %
Platelets: 344 10*3/uL (ref 150–400)
RBC: 4.48 MIL/uL (ref 3.87–5.11)
RDW: 14.6 % (ref 11.5–15.5)
WBC: 8.5 10*3/uL (ref 4.0–10.5)
nRBC: 0 % (ref 0.0–0.2)

## 2020-09-05 LAB — LIPASE, BLOOD: Lipase: 44 U/L (ref 11–51)

## 2020-09-05 LAB — URINALYSIS, ROUTINE W REFLEX MICROSCOPIC
Bacteria, UA: NONE SEEN
Bilirubin Urine: NEGATIVE
Glucose, UA: NEGATIVE mg/dL
Hgb urine dipstick: NEGATIVE
Ketones, ur: NEGATIVE mg/dL
Nitrite: NEGATIVE
Protein, ur: NEGATIVE mg/dL
Specific Gravity, Urine: 1.021 (ref 1.005–1.030)
pH: 5 (ref 5.0–8.0)

## 2020-09-05 LAB — SEDIMENTATION RATE: Sed Rate: 15 mm/hr (ref 0–22)

## 2020-09-05 LAB — COMPREHENSIVE METABOLIC PANEL
ALT: 22 U/L (ref 0–44)
AST: 22 U/L (ref 15–41)
Albumin: 3.7 g/dL (ref 3.5–5.0)
Alkaline Phosphatase: 69 U/L (ref 38–126)
Anion gap: 7 (ref 5–15)
BUN: 18 mg/dL (ref 6–20)
CO2: 21 mmol/L — ABNORMAL LOW (ref 22–32)
Calcium: 9.1 mg/dL (ref 8.9–10.3)
Chloride: 111 mmol/L (ref 98–111)
Creatinine, Ser: 0.9 mg/dL (ref 0.44–1.00)
GFR, Estimated: >60 mL/min (ref >60)
Glucose, Bld: 140 mg/dL — ABNORMAL HIGH (ref 70–99)
Potassium: 3.4 mmol/L — ABNORMAL LOW (ref 3.5–5.1)
Sodium: 139 mmol/L (ref 135–145)
Total Bilirubin: 0.3 mg/dL (ref 0.3–1.2)
Total Protein: 6.9 g/dL (ref 6.5–8.1)

## 2020-09-05 LAB — I-STAT BETA HCG BLOOD, ED (MC, WL, AP ONLY): I-stat hCG, quantitative: <5 m[IU]/mL (ref <5)

## 2020-09-05 LAB — C-REACTIVE PROTEIN: CRP: <0.5 mg/dL (ref <1.0)

## 2020-09-05 IMAGING — CR DG HIP (WITH OR WITHOUT PELVIS) 2-3V*L*
3 series · 3 of 3 positions shown · non-contrast
Comparison: [DATE]

CLINICAL DATA: Left hip pain

EXAM:
DG HIP (WITH OR WITHOUT PELVIS) 2-3V LEFT

[pelvis ap]
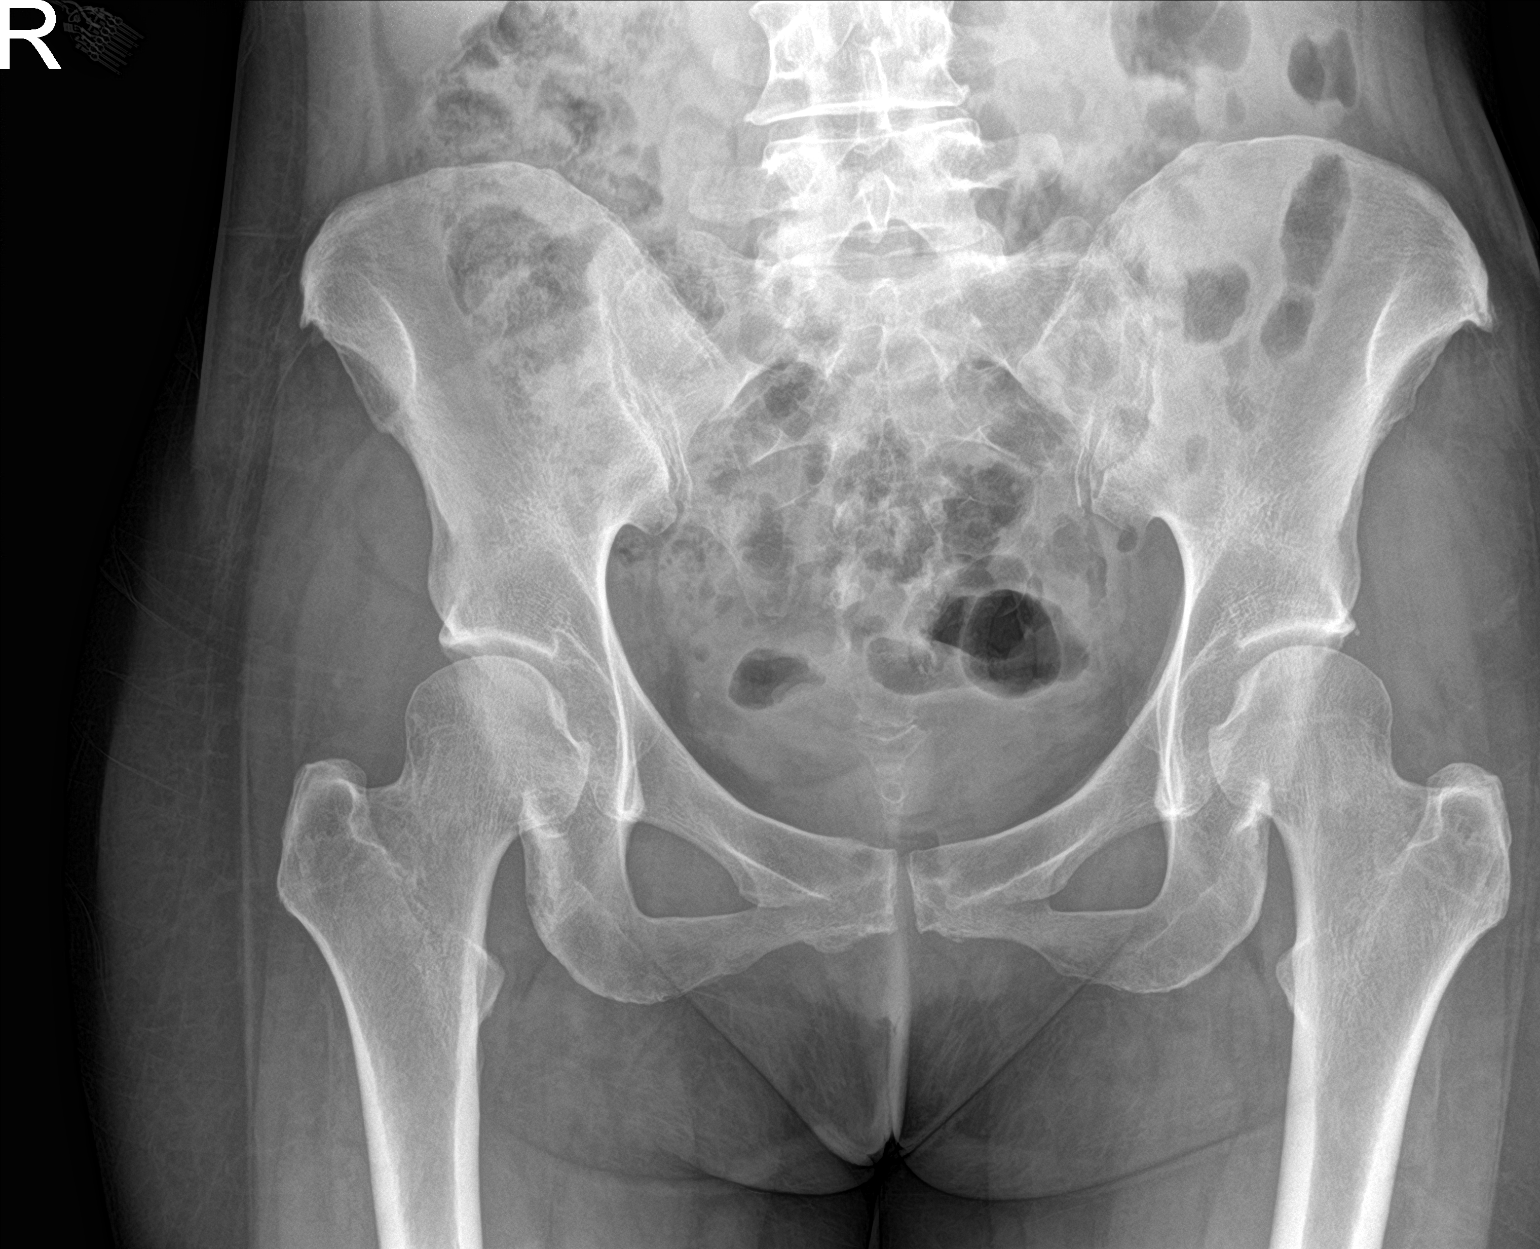

[hip ap]
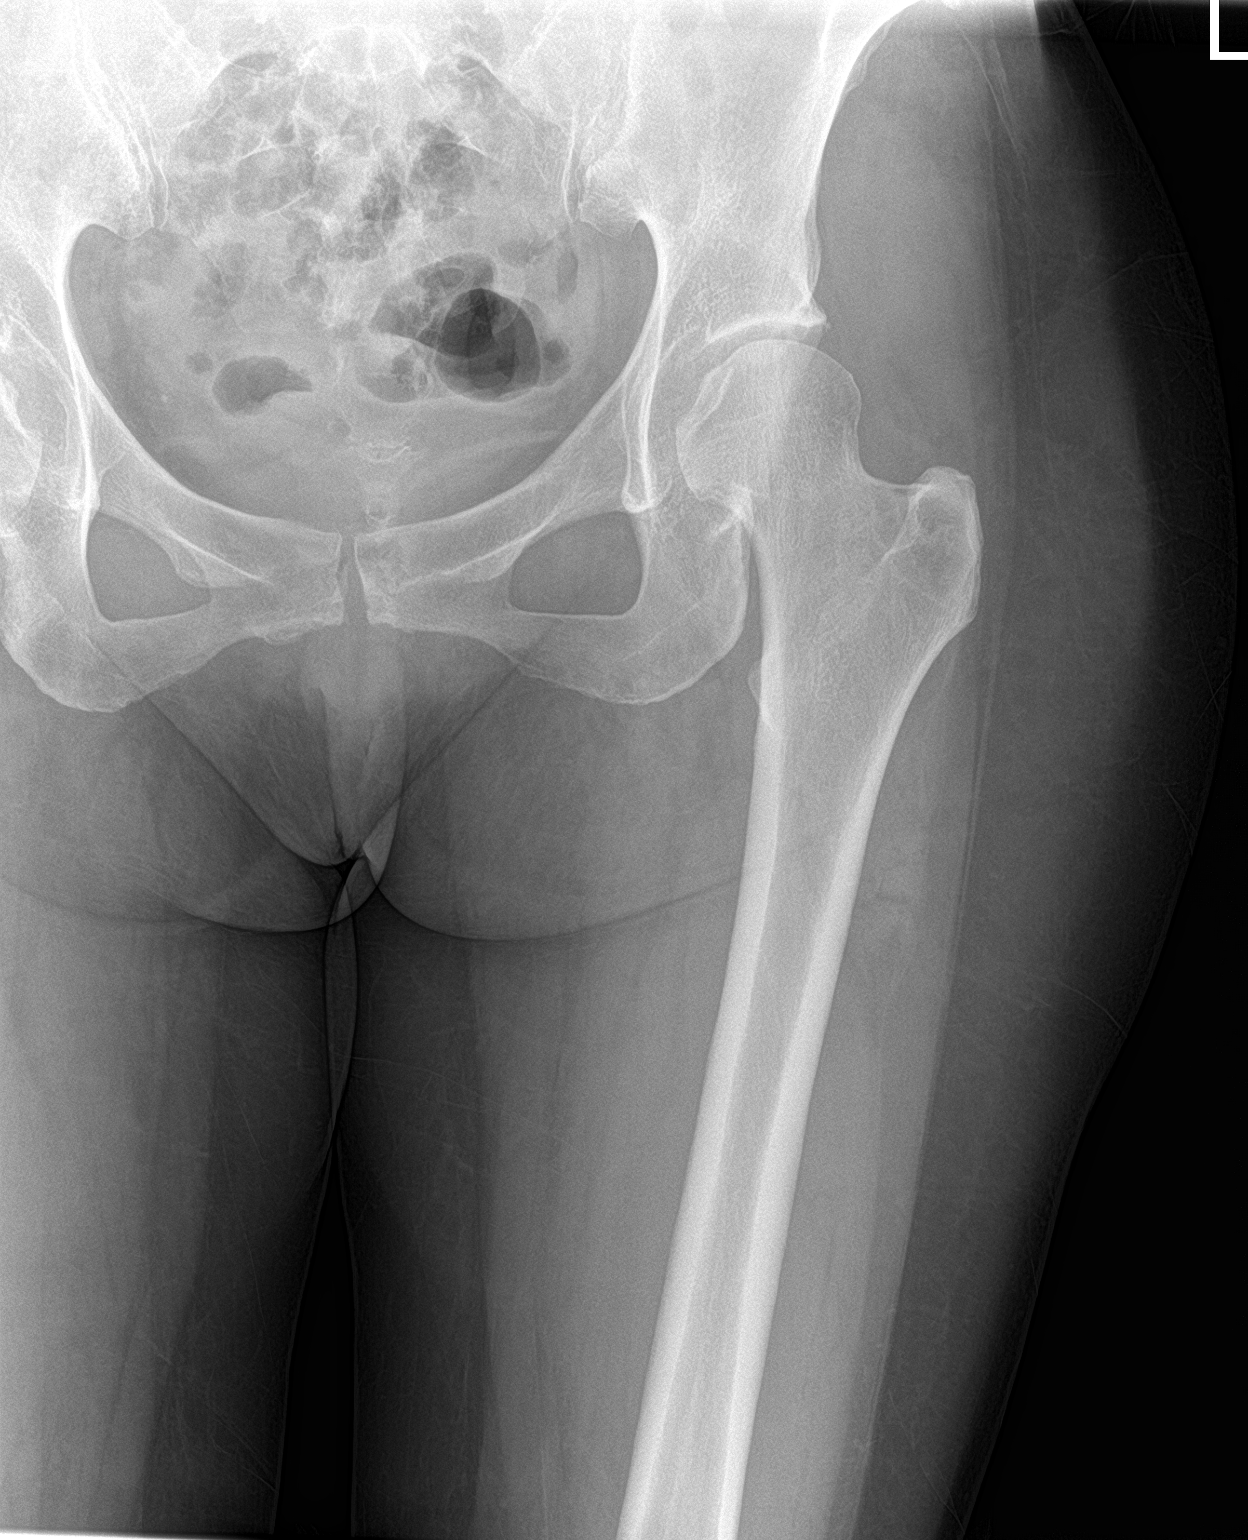

[hip lat]
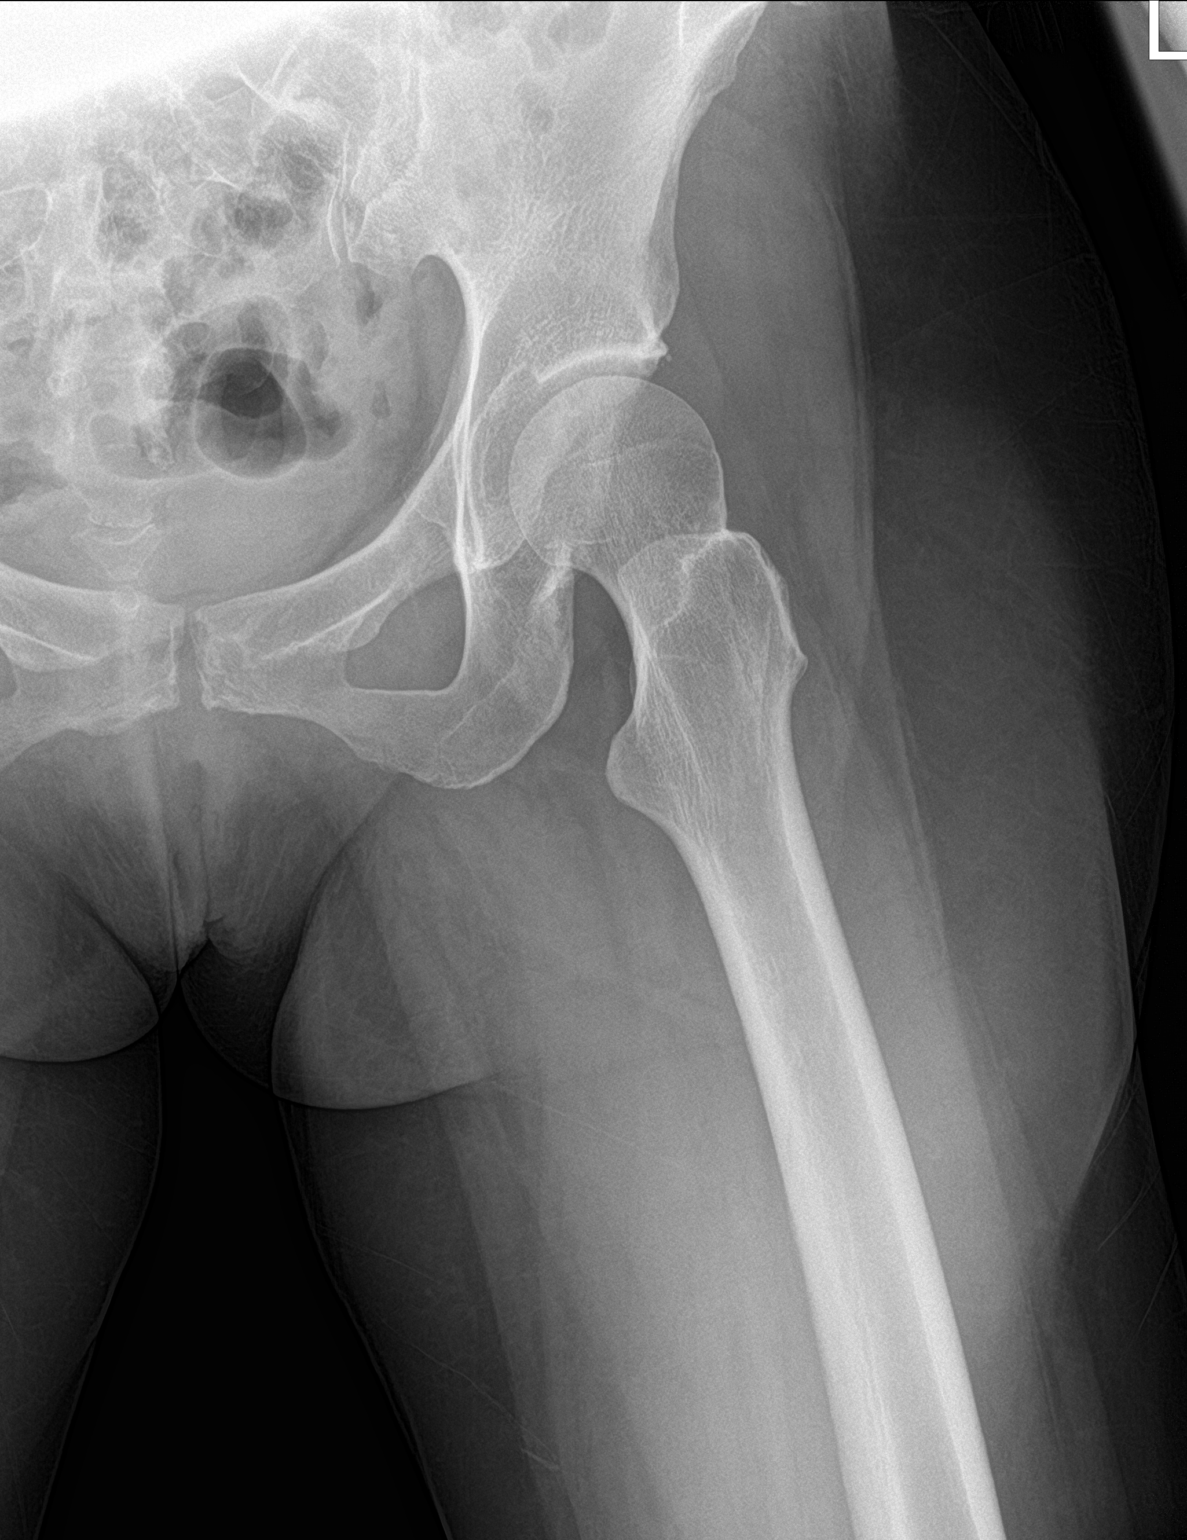

[3 of 3 positions shown; findings below may reference images not displayed]

FINDINGS: There is no evidence of hip fracture or dislocation. There is no
evidence of arthropathy or other focal bone abnormality.
IMPRESSION: Negative.

## 2020-09-05 IMAGING — MR MR LUMBAR SPINE WO/W CM
4 of 7 series · 18 of 48 positions shown · IV contrast (gadavist)
Comparison: Lumbar spine radiographs [DATE]

CLINICAL DATA: Low back pain and left hip pain. Suspect infection.
History of HIV and diabetes

EXAM:
MRI LUMBAR SPINE WITHOUT AND WITH CONTRAST
TECHNIQUE: Multiplanar and multiecho pulse sequences of the lumbar spine were
obtained without and with intravenous contrast.
CONTRAST:  8mL GADAVIST GADOBUTROL 1 MMOL/ML IV SOLN

[Series 2: T2 · sagittal · 4.0mm · 0.55mm/px · 4 of 15 slices shown (1 of 2)]
[im 1/15]
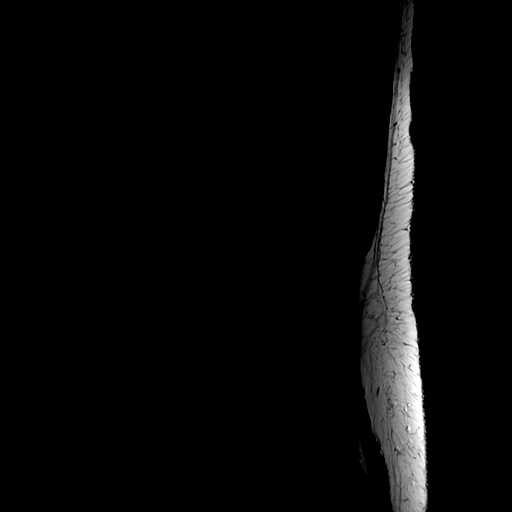
[im 5/15]
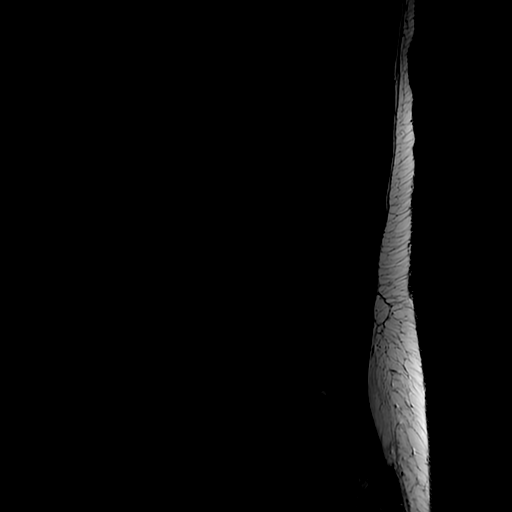
[im 10/15]
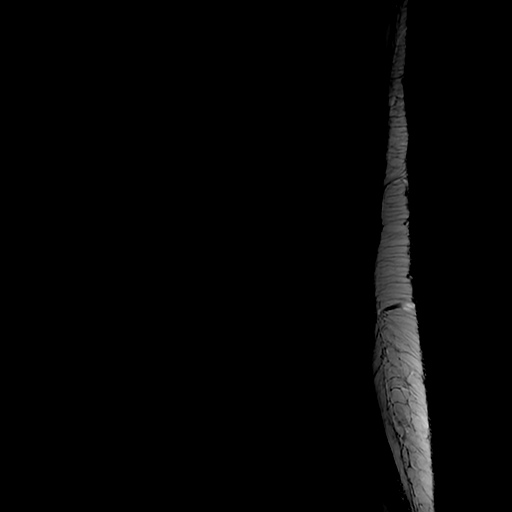
[im 15/15]
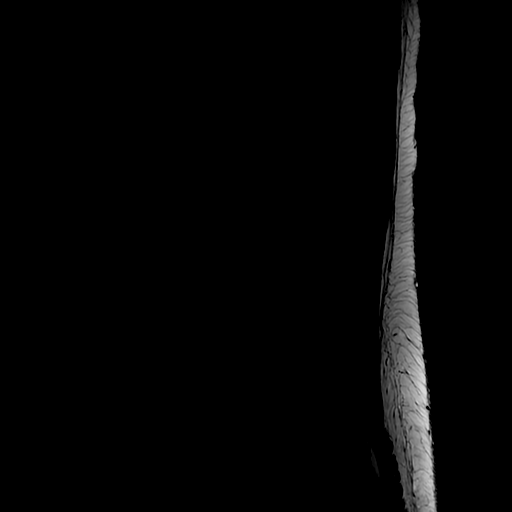

[Series 4: T1 · sagittal · 4.0mm · 0.55mm/px · 3 of 15 slices shown (1 of 2)]
[im 1/15]
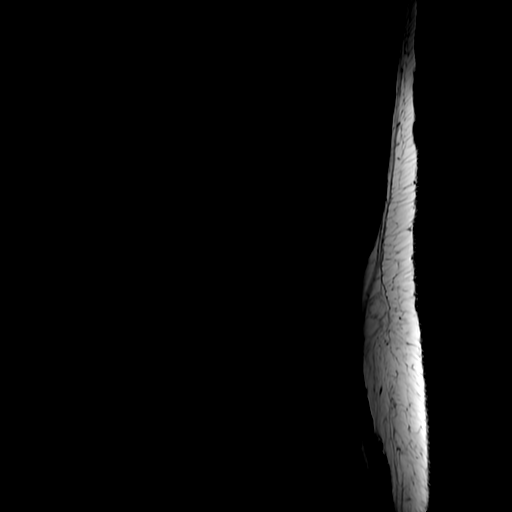
[im 8/15]
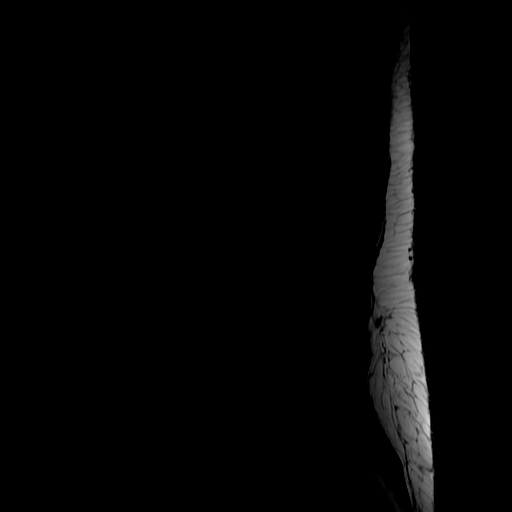
[im 15/15]
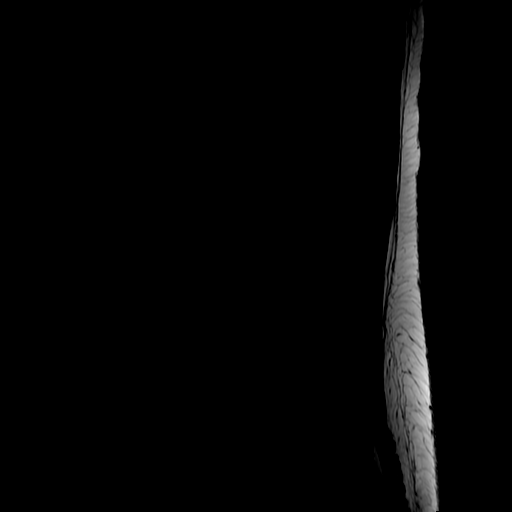

[Series 5: T2 · axial · 4.0mm · 0.39mm/px · z∈[+42,+220]mm · 8 of 32 slices shown (2 of 2)]
[im 1/32]
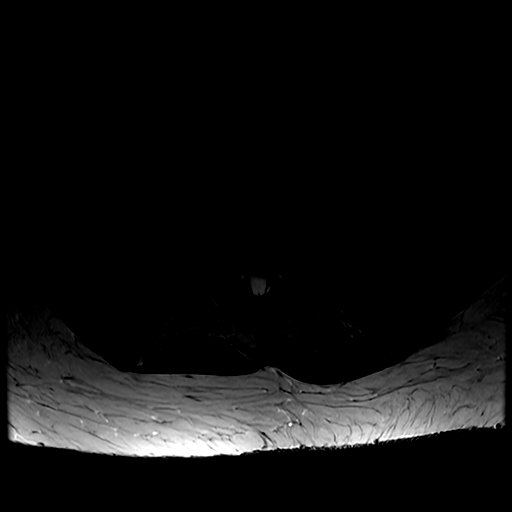
[im 4/32]
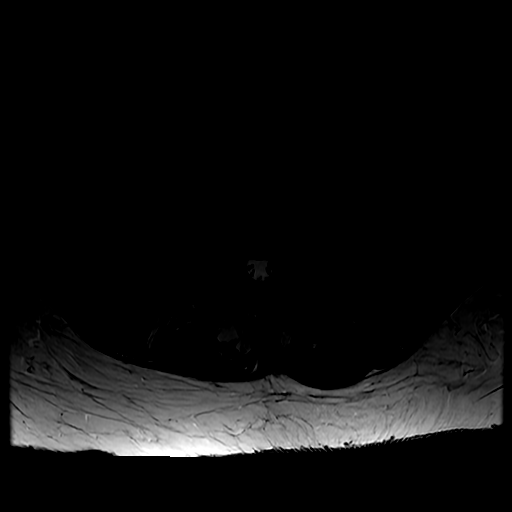
[im 11/32]
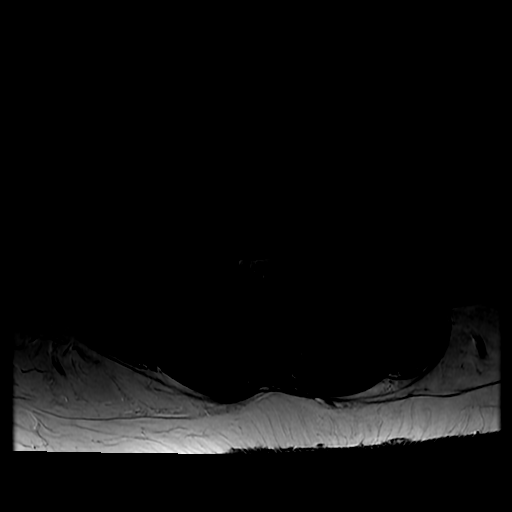
[im 14/32]
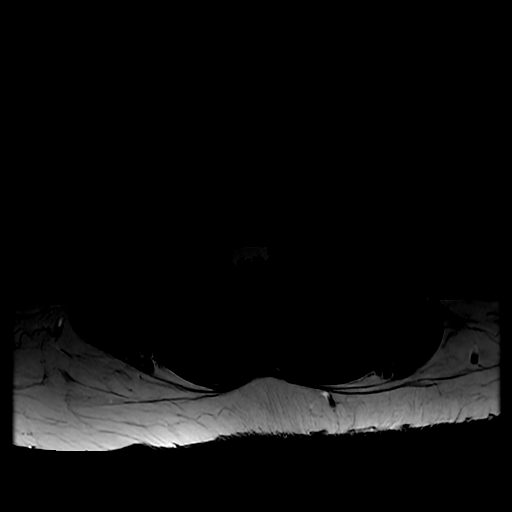
[im 18/32]
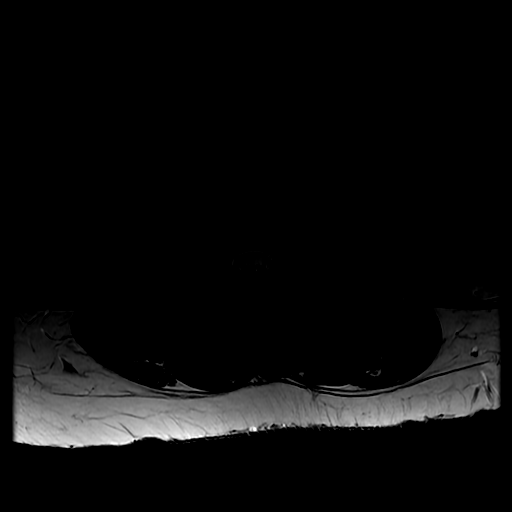
[im 21/32]
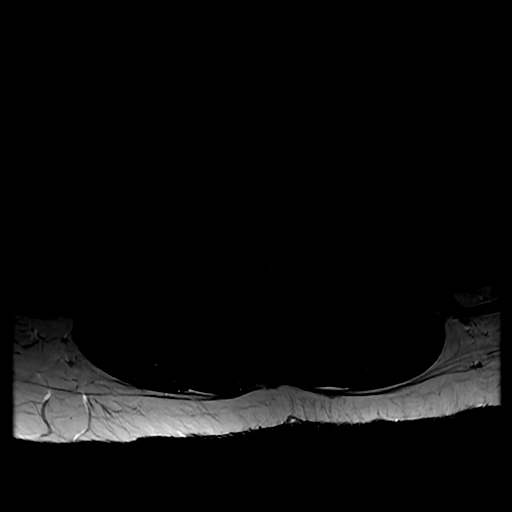
[im 28/32]
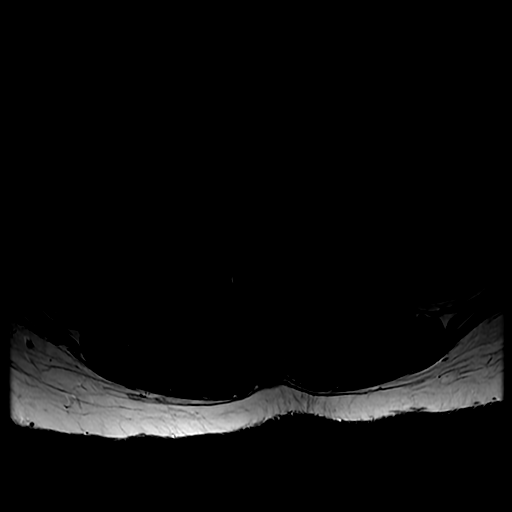
[im 32/32]
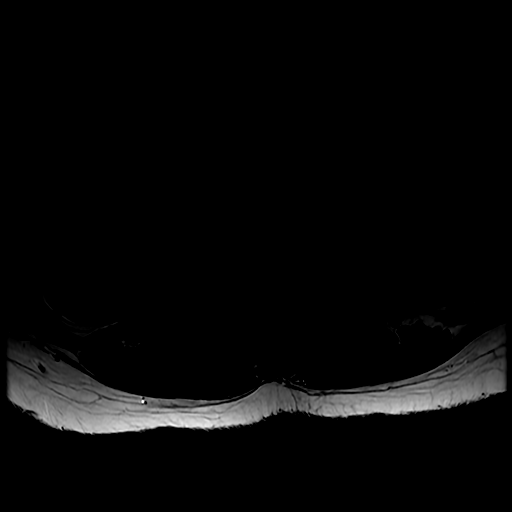

[Series 6: T1 · axial · 4.0mm · 0.39mm/px · z∈[+57,+200]mm · 3 of 32 slices shown (2 of 2)]
[im 4/32]
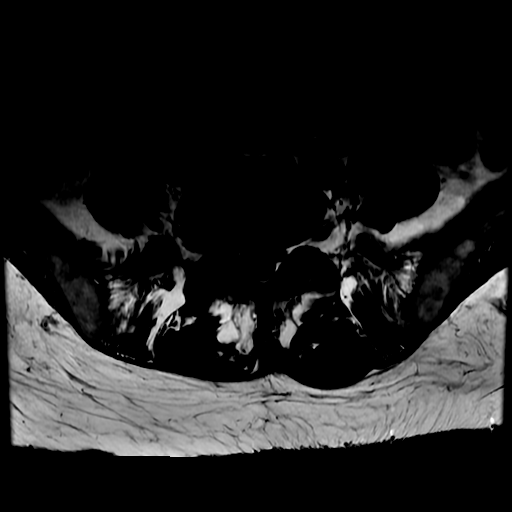
[im 18/32]
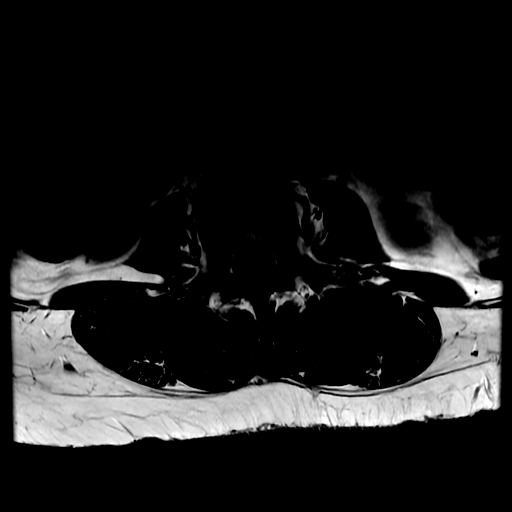
[im 28/32]
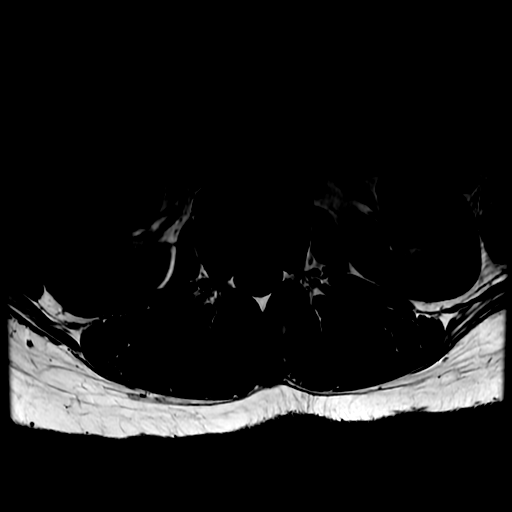

[18 of 48 positions shown; findings below may reference images not displayed]

FINDINGS: Segmentation:  Normal

Alignment:  Slight retrolisthesis L2-3 and L3-4 and L4-5

Vertebrae: Negative for fracture or mass. Discogenic edema on the
right at L5-S1 most likely due to disc degeneration with associated
disc space narrowing and sclerotic bone marrow changes. Disc space
infection on the right not considered likely.

Conus medullaris and cauda equina: Conus extends to the L1 level.
Conus and cauda equina appear normal.

Paraspinal and other soft tissues: Negative for paraspinous mass,
adenopathy, or fluid collection.

Disc levels:

L1-2: Negative

L2-3: Mild retrolisthesis. Mild disc space narrowing and disc
bulging without significant stenosis

L3-4: Mild retrolisthesis. Mild disc and mild facet degeneration.
Negative for stenosis

L4-5: Extruded disc fragment on the left foramen with compression
left L4 nerve root. Bilateral facet hypertrophy. Broad-based central
disc protrusion. Moderate central canal stenosis. Severe
subarticular stenosis on the left with left L5 nerve root
impingement, and moderate to severe subarticular stenosis on the
right

L5-S1: Asymmetric disc space narrowing on the right. This is
associated small amount of fluid in the disc space on the right.
Left-sided disc space not significantly narrowed. There is central
and right-sided disc protrusion with associated spurring. There is
severe right foraminal encroachment with right L5 nerve root
impingement. There is also right S1 nerve root impingement.
IMPRESSION: Extruded disc fragment on the left at L4-5 with left L4 nerve root
impingement. Broad-based disc protrusion with moderate spinal
stenosis. Subarticular stenosis bilaterally, left greater than right

Asymmetric disc space narrowing at L5-S1 with discogenic sclerosis
and edema in the right most likely degenerative in nature. Infection
not considered likely. There is central and right-sided disc
protrusion and spurring with severe right foraminal encroachment
right L5 nerve root impingement. There is also right S1 nerve root
impingement.

## 2020-09-05 IMAGING — CR DG CHEST 2V
2 series · 2 of 2 positions shown · non-contrast
Comparison: None.

CLINICAL DATA: Productive cough

EXAM:
CHEST - 2 VIEW

[chest pa]
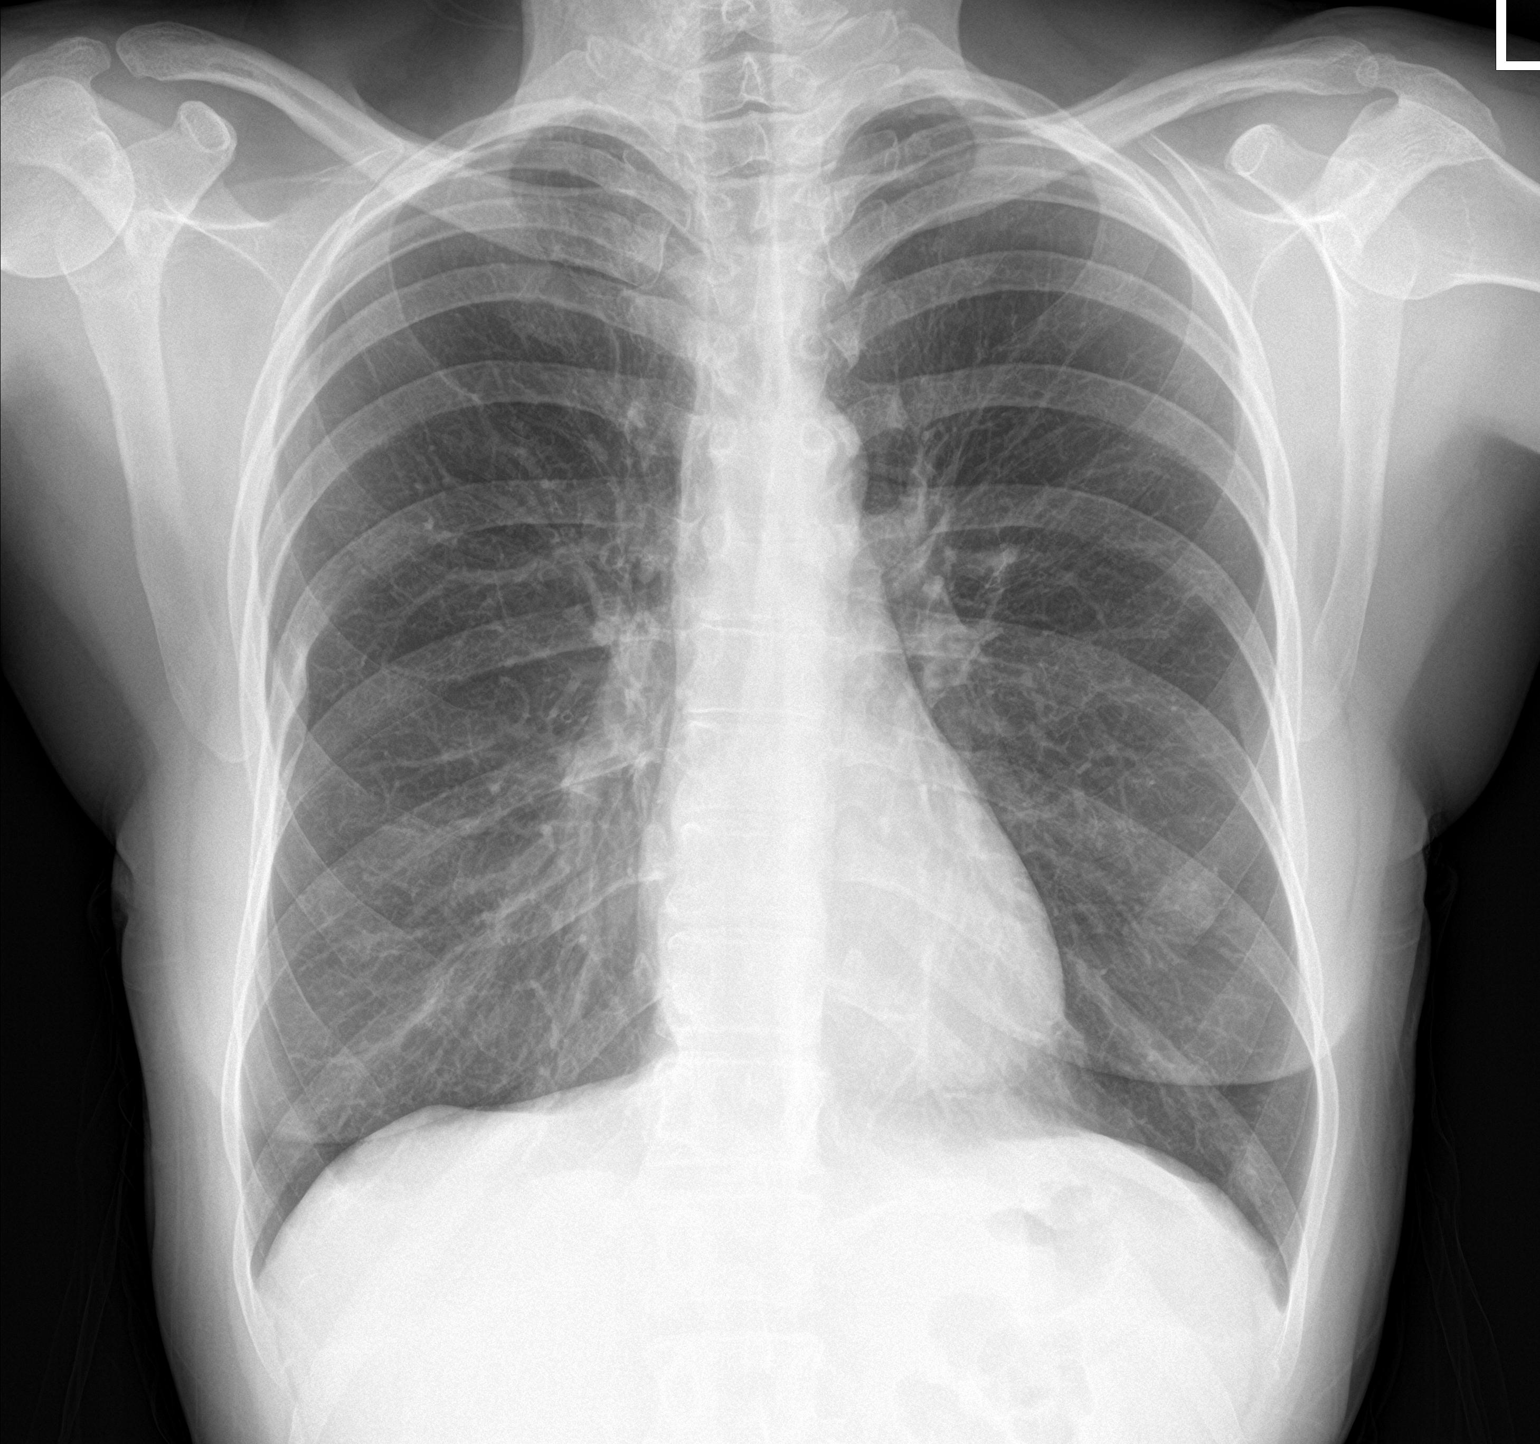

[chest lat]
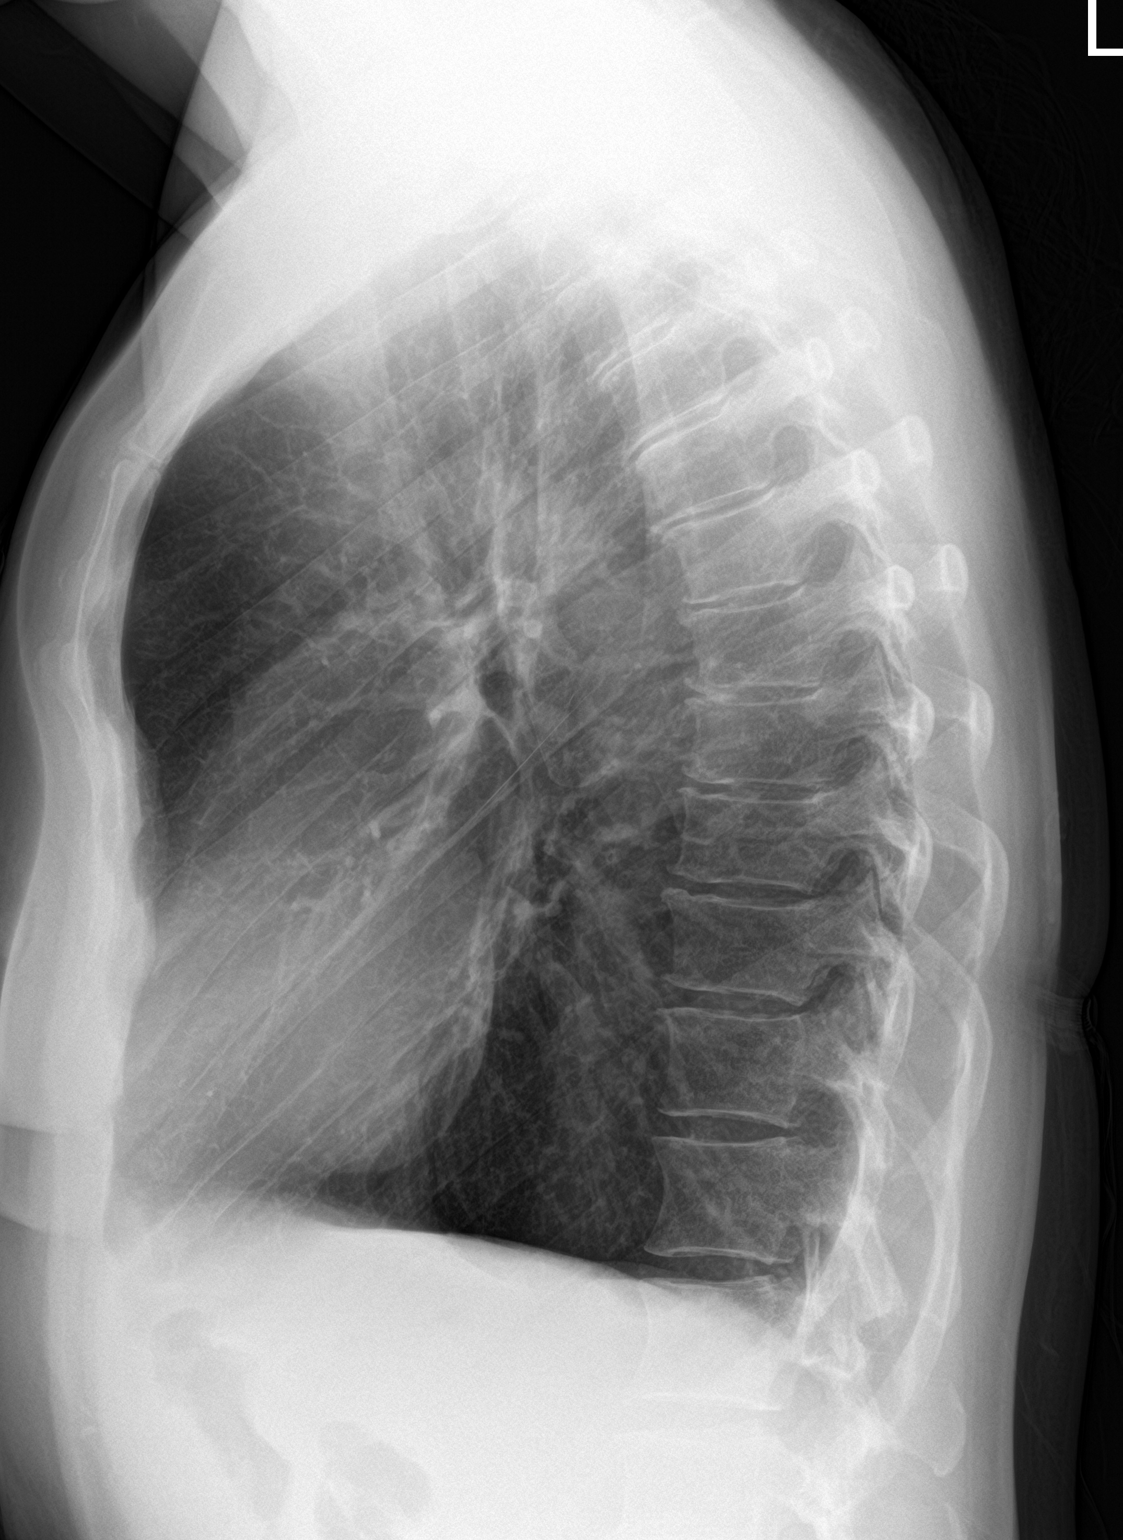

[2 of 2 positions shown; findings below may reference images not displayed]

FINDINGS: The lungs are symmetrically mildly hyperinflated in keeping with
changes of underlying COPD. The lungs are clear. No pneumothorax or
pleural effusion. Cardiac size within normal limits. The pulmonary
vascularity is normal. No acute bone abnormality.
IMPRESSION: COPD with mild pulmonary hyperinflation.

## 2020-09-05 IMAGING — CR DG LUMBAR SPINE COMPLETE 4+V
5 series · 5 of 5 positions shown · non-contrast
Comparison: [DATE]

CLINICAL DATA: Low back pain

EXAM:
LUMBAR SPINE - COMPLETE 4+ VIEW

[l-spine ap]
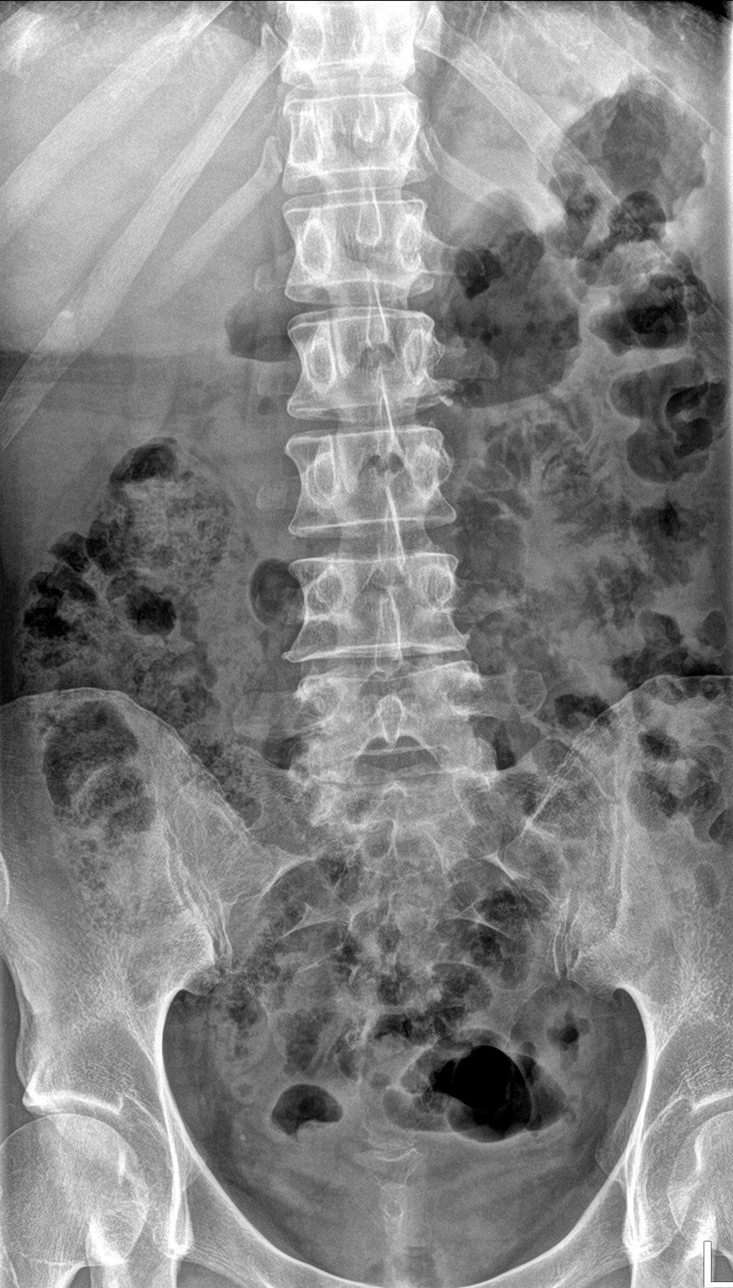

[l-spine obl (1 of 2)]
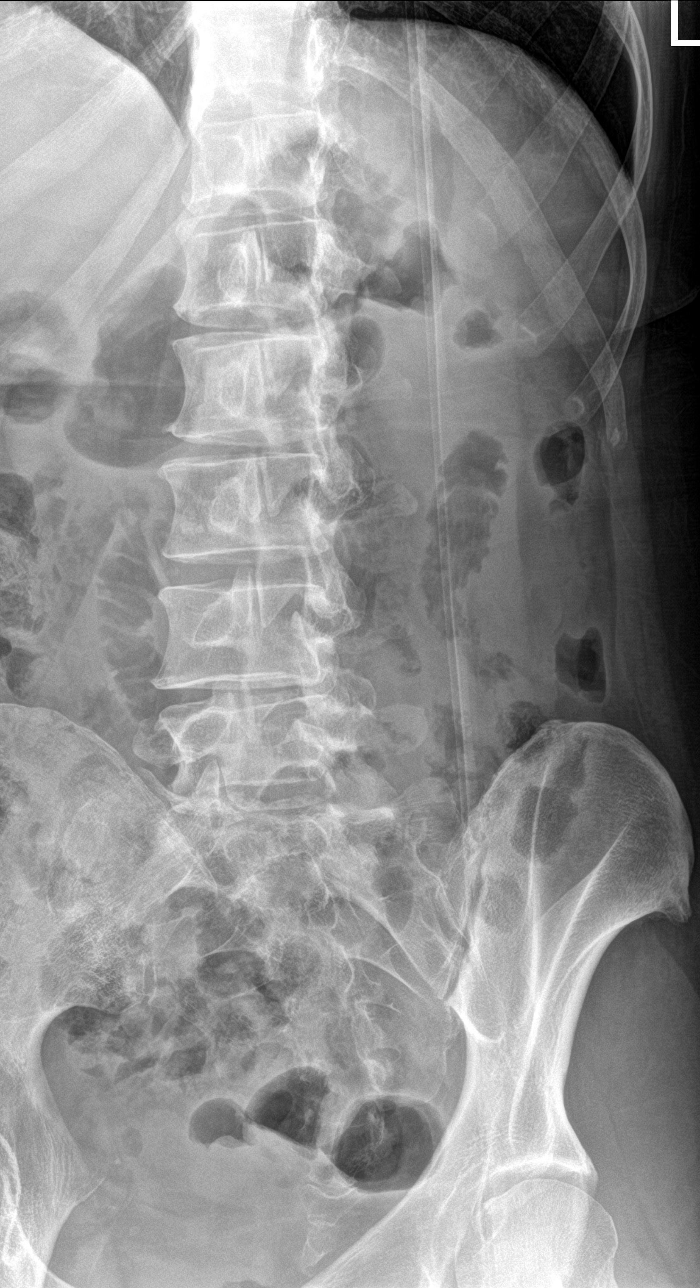

[l-spine obl (2 of 2)]
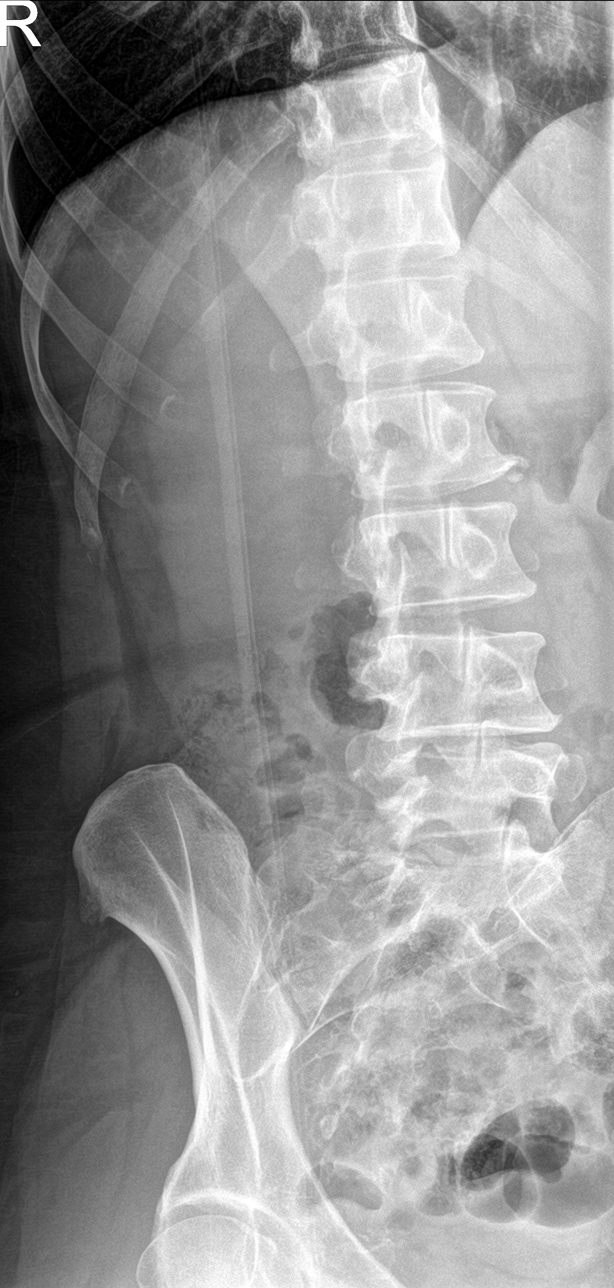

[l-spine lat]
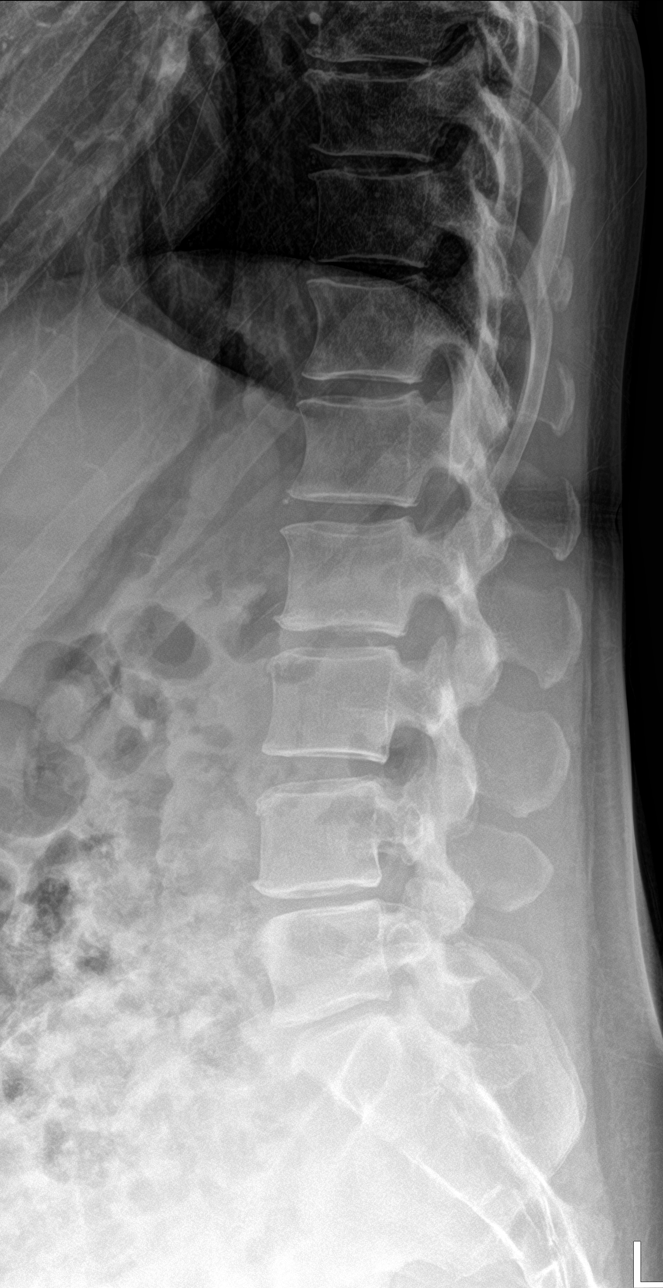

[l-spine spot]
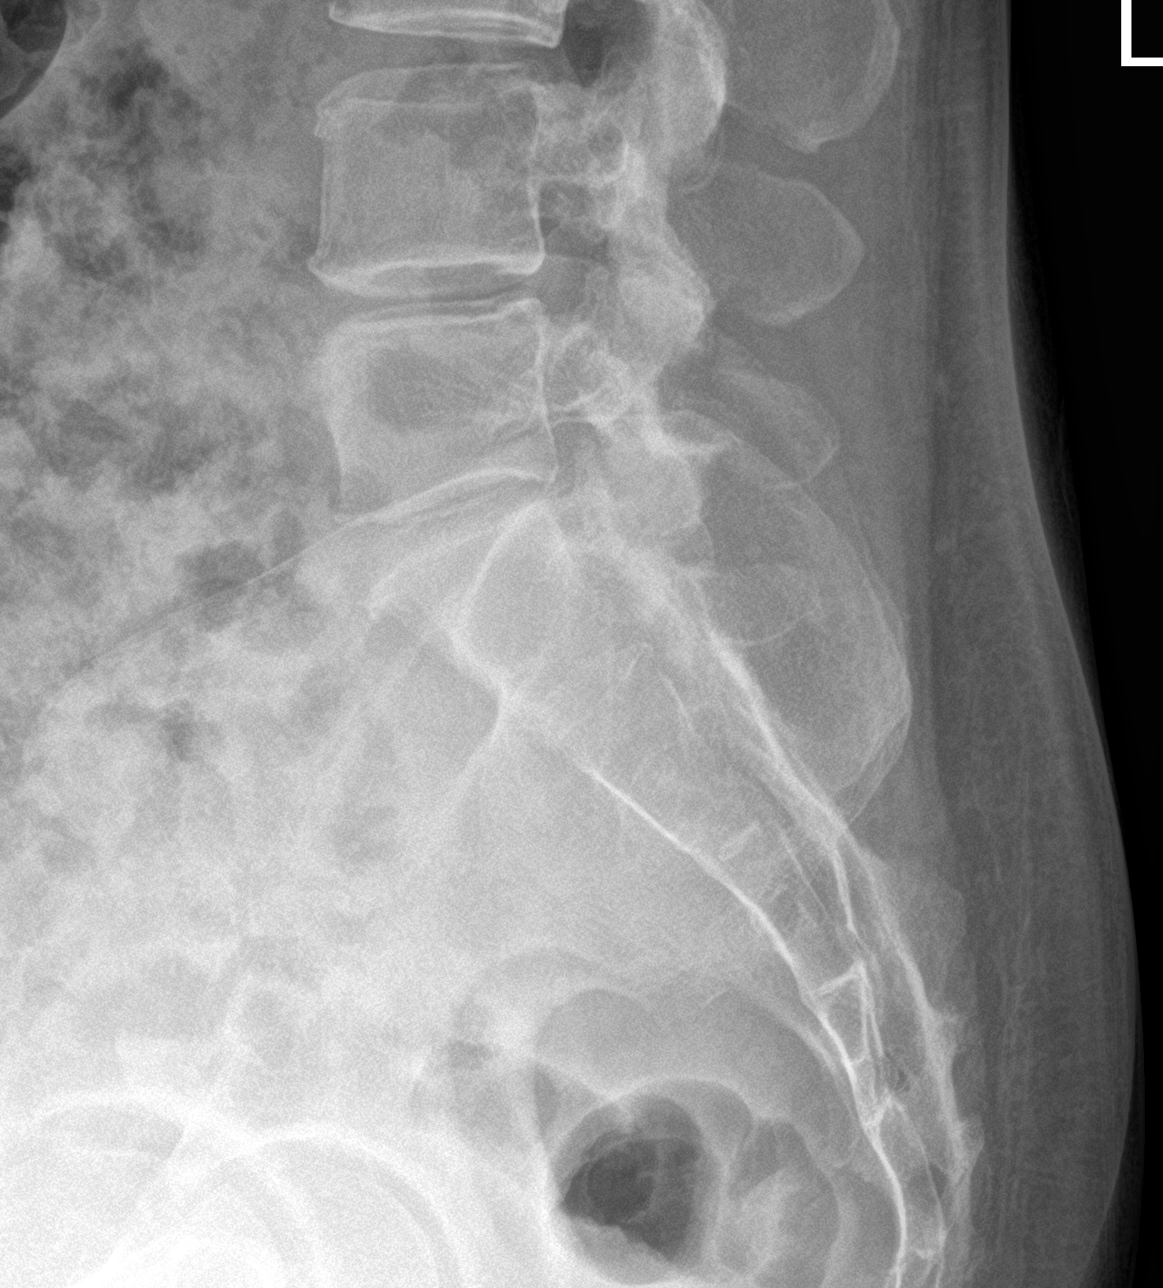

[5 of 5 positions shown; findings below may reference images not displayed]

FINDINGS: Mild straightening of the lumbar spine may relate to underlying
muscular spasm. No acute fracture or listhesis of the lumbar spine.
Vertebral body height has been preserved. There is mild
intervertebral disc space narrowing and endplate remodeling at L2-3,
L4-5, and L5-S1 in keeping with changes of mild degenerative disc
disease. Remaining intervertebral disc heights are preserved. The
oblique views demonstrate no evidence of pars defect. The paraspinal
soft tissues are unremarkable.
IMPRESSION: Mild multilevel degenerative disc disease.

Mild straightening of the lumbar spine, possibly related to
underlying muscular spasm.

## 2020-09-05 IMAGING — MR MR HIP*L* WO/W CM
5 of 11 series · 19 of 40 positions shown · IV contrast (8 GAD)
Comparison: Plain films lumbar spine [DATE].

CLINICAL DATA: Onset left hip pain approximately 2 days ago in an
immunocompromised patient. No known injury.

EXAM:
MRI OF THE LEFT HIP WITHOUT AND WITH CONTRAST
TECHNIQUE: Multiplanar, multisequence MR imaging was performed both before and
after administration of intravenous contrast.
CONTRAST:  8 mL GADAVIST IV SOLN

[Series 3: T2 fat-sat · coronal · 4.0mm · 0.70mm/px · 5 of 32 slices shown (1 of 2)]
[im 1/32]
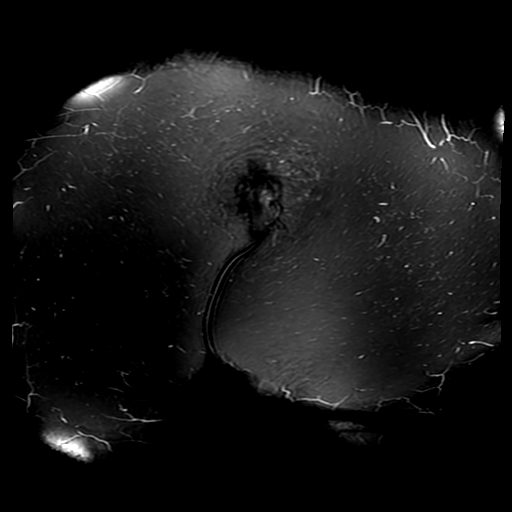
[im 8/32]
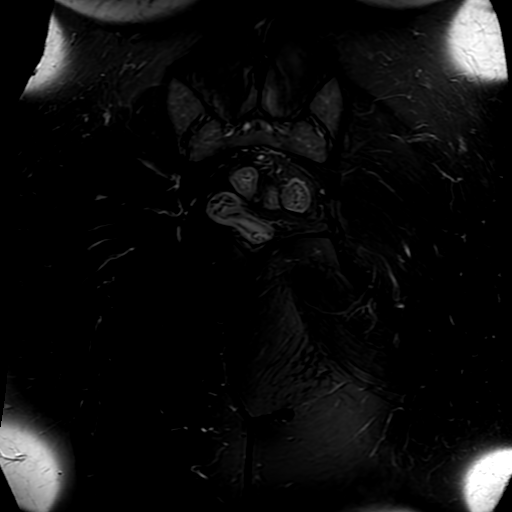
[im 16/32]
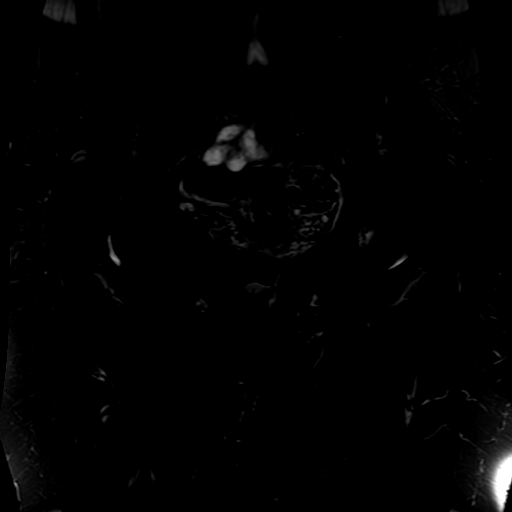
[im 24/32]
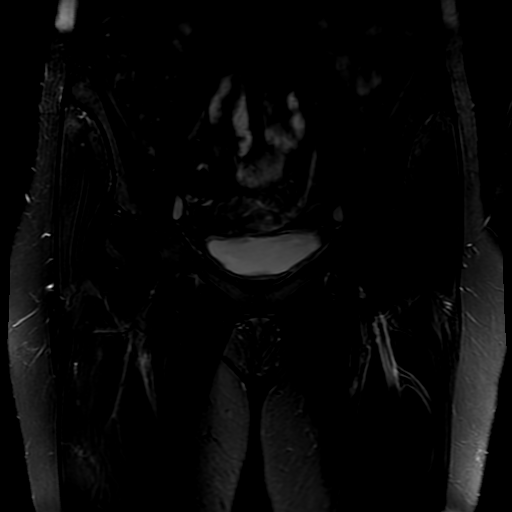
[im 32/32]
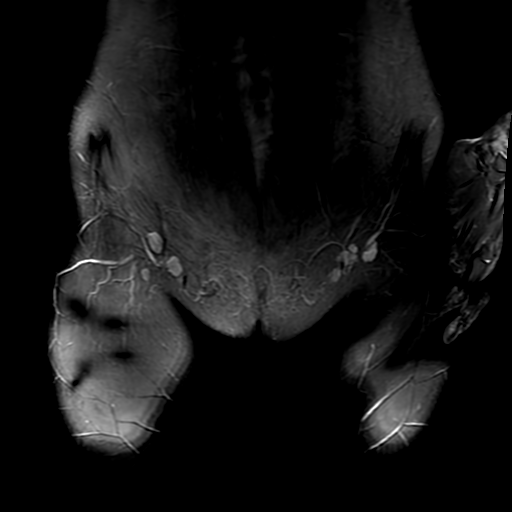

[Series 4: T1 fat-sat post-contrast · axial · 4.0mm · 0.35mm/px · z∈[-110,+7]mm · 4 of 25 slices shown]
[im 1/25]
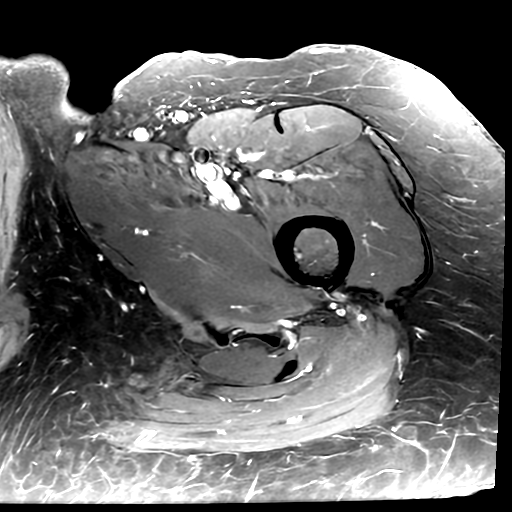
[im 9/25]
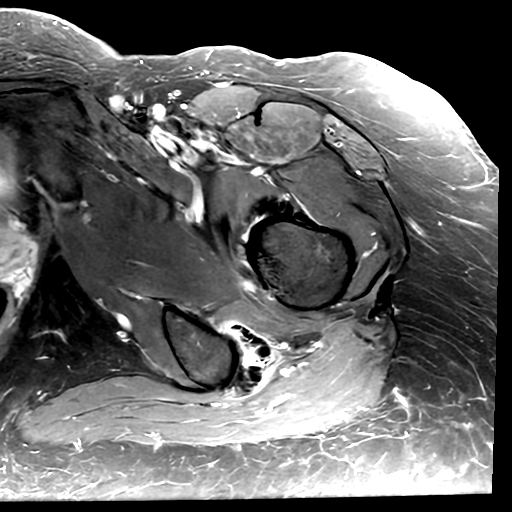
[im 17/25]
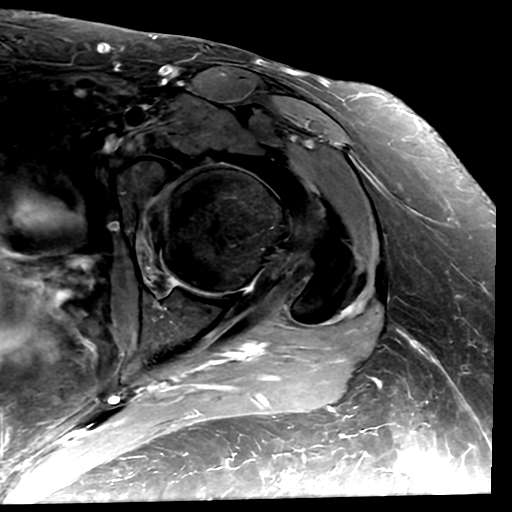
[im 25/25]
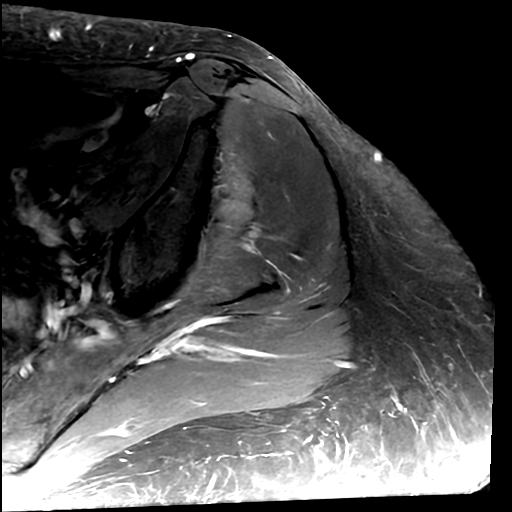

[Series 5: T2 fat-sat · axial · 4.0mm · 0.70mm/px · z∈[-139,+6]mm · 4 of 30 slices shown (2 of 2)]
[im 1/30]
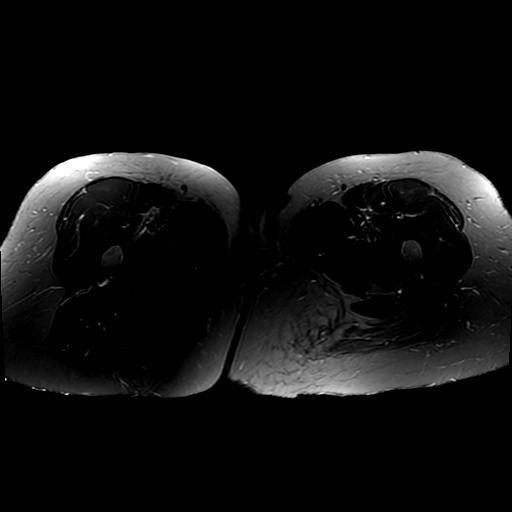
[im 10/30]
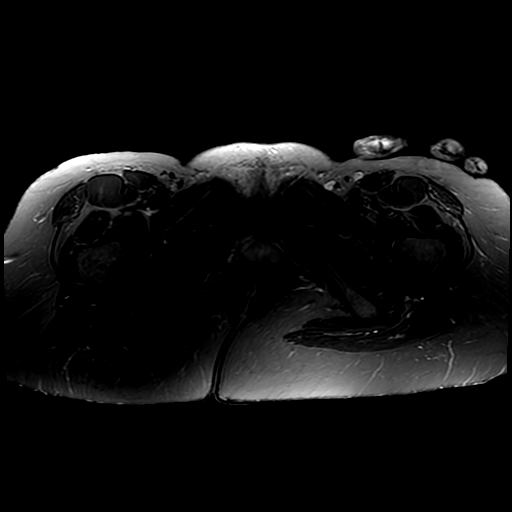
[im 20/30]
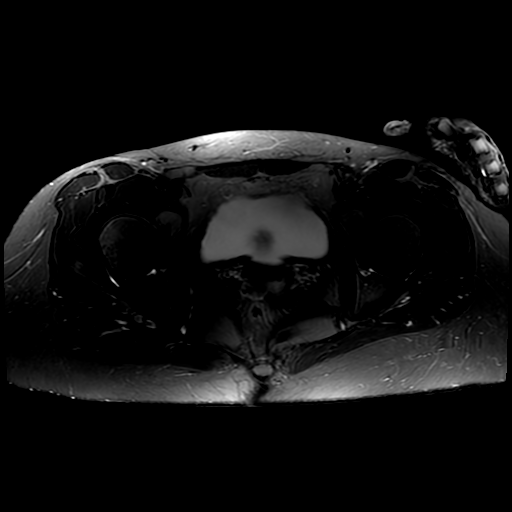
[im 30/30]
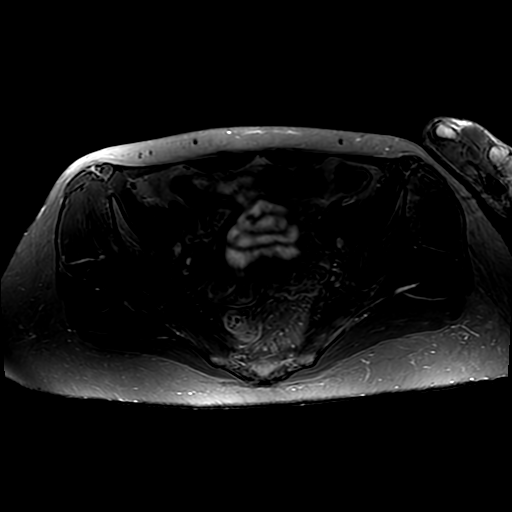

[Series 10: PD fat-sat · sagittal · 4.0mm · 0.35mm/px · 3 of 25 slices shown (1 of 2)]
[im 1/25]
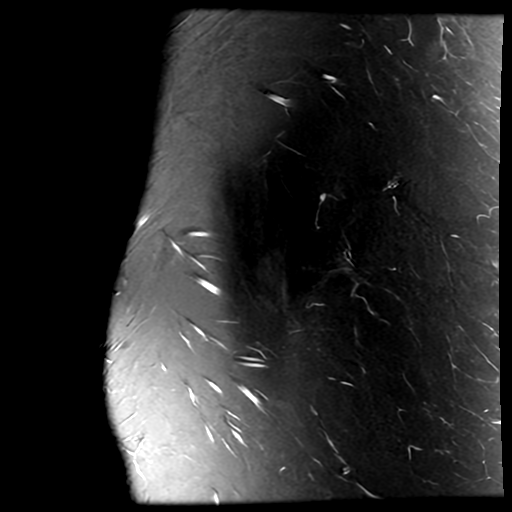
[im 13/25]
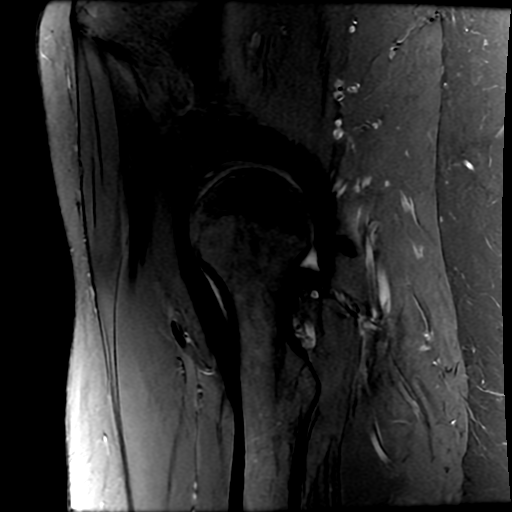
[im 25/25]
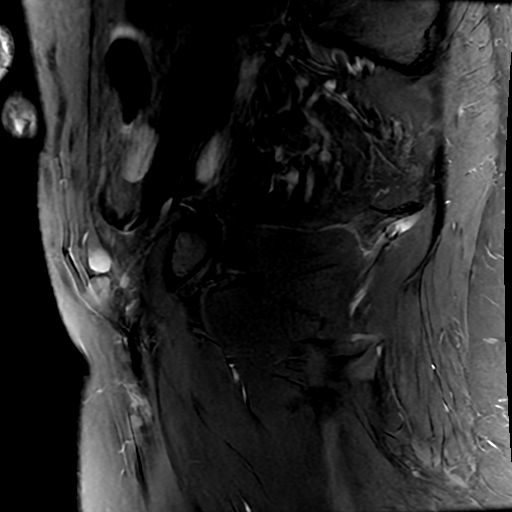

[Series 11: PD fat-sat · coronal · 4.0mm · 0.35mm/px · 3 of 25 slices shown (2 of 2)]
[im 1/25]
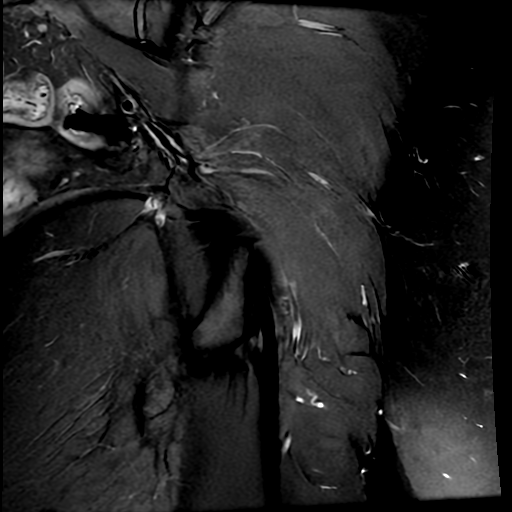
[im 13/25]
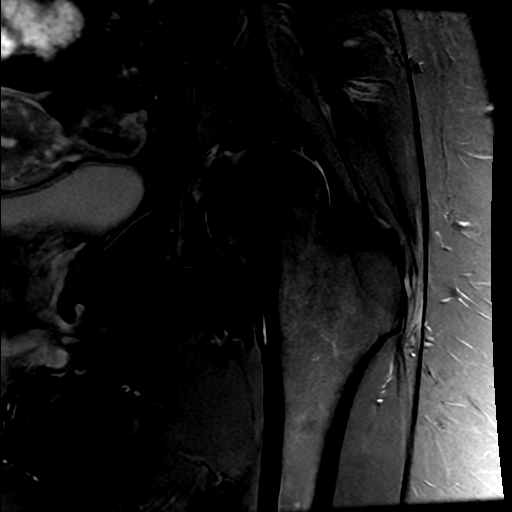
[im 25/25]
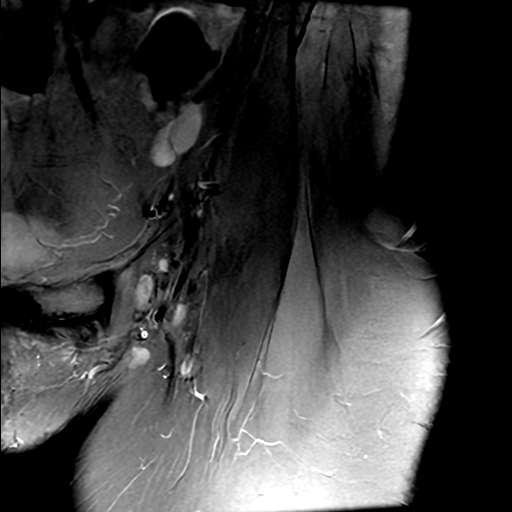

[19 of 40 positions shown; findings below may reference images not displayed]

FINDINGS: Bones: Negative for osteomyelitis, fracture, stress change or
worrisome bony lesion. 2-3 tiny subchondral cysts are seen in the
anterior aspect of the left acetabulum. Endplate signal change
eccentric to the right at L5-S1 is likely degenerative. See report
of dedicated lumbar spine MRI today. No hip or SI joint effusion. No
avascular necrosis of the femoral heads.

Articular cartilage and labrum

Articular cartilage:  Mildly degenerated.

Labrum:  Intact.

Joint or bursal effusion

Joint effusion:  None.

Bursae: Negative.

Muscles and tendons

Muscles and tendons:  Intact and normal in appearance.

Other findings

Miscellaneous:   None.
IMPRESSION: Mild left hip degenerative disease. Otherwise negative. No evidence
of infection or other acute abnormality.

## 2020-09-05 MED ORDER — LORAZEPAM 2 MG/ML IJ SOLN
1.0000 mg | Freq: Once | INTRAMUSCULAR | Status: AC
  Administered 2020-09-05: 1 mg via INTRAVENOUS
  Filled 2020-09-05: qty 1

## 2020-09-05 MED ORDER — ACETAMINOPHEN 325 MG PO TABS
650.0000 mg | ORAL_TABLET | Freq: Once | ORAL | Status: AC
  Administered 2020-09-05: 650 mg via ORAL
  Filled 2020-09-05: qty 2

## 2020-09-05 MED ORDER — GADOBUTROL 1 MMOL/ML IV SOLN
8.0000 mL | Freq: Once | INTRAVENOUS | Status: AC | PRN
  Administered 2020-09-05: 8 mL via INTRAVENOUS

## 2020-09-05 MED ORDER — PREDNISONE 20 MG PO TABS: 40.0000 mg | ORAL_TABLET | Freq: Every day | ORAL | 0 refills | Status: AC

## 2020-09-05 NOTE — ED Notes (Signed)
Pt ambulatory to and from restroom  

## 2020-09-05 NOTE — ED Notes (Signed)
PT cried for 5 minutes when IV was placed.  IV inserted without any difficulty.  Pt refused 2nd blood culture.  Provider notified.

## 2020-09-05 NOTE — ED Provider Notes (Signed)
Patient care assumed frm previous provider see note for full HPI.  In summation, hx of Meth use, HIV positive who presented for left hip and low back pain since 2021  Followed by ID  Dx with bursitis month ago?  States last Meth use 1 year ago however admitted to use with ID 1 week ago at appointment  Left hip pain, midline low back  No bowel or bladder incontinence, saddle anesthesia   Plan on MRI lumbar, left hip Physical Exam  BP (!) 142/98 (BP Location: Right Arm)   Pulse 72   Temp 98.6 F (37 C) (Oral)   Resp 14   Ht 5\' 7"  (1.702 m)   Wt 80 kg   SpO2 100%   BMI 27.62 kg/m   Physical Exam Vitals and nursing note reviewed.  Constitutional:      General: She is not in acute distress.    Appearance: She is well-developed. She is not ill-appearing, toxic-appearing or diaphoretic.  HENT:     Head: Atraumatic.  Eyes:     Pupils: Pupils are equal, round, and reactive to light.  Cardiovascular:     Rate and Rhythm: Normal rate.  Pulmonary:     Effort: No respiratory distress.  Abdominal:     General: There is no distension.  Musculoskeletal:        General: Normal range of motion.     Cervical back: Normal range of motion.     Comments: Full range of motion without difficulty.  No shortening or rotation of legs.  She is able to flex and extend at bilateral hips.  Rolls in bed without difficulty.  No midline spinal tenderness.  Skin:    General: Skin is warm and dry.     Comments: Scattered wounds all over patient consistent with picking  Neurological:     General: No focal deficit present.     Mental Status: She is alert.  Psychiatric:        Mood and Affect: Mood normal.    ED Course/Procedures     Procedures DG Chest 2 View  Result Date: 09/05/2020 CLINICAL DATA:  Productive cough EXAM: CHEST - 2 VIEW COMPARISON:  None. FINDINGS: The lungs are symmetrically mildly hyperinflated in keeping with changes of underlying COPD. The lungs are clear. No pneumothorax  or pleural effusion. Cardiac size within normal limits. The pulmonary vascularity is normal. No acute bone abnormality. IMPRESSION: COPD with mild pulmonary hyperinflation. Electronically Signed   By: 09/07/2020 MD   On: 09/05/2020 05:40   DG Lumbar Spine Complete  Result Date: 09/05/2020 CLINICAL DATA:  Low back pain EXAM: LUMBAR SPINE - COMPLETE 4+ VIEW COMPARISON:  10/12/2019 FINDINGS: Mild straightening of the lumbar spine may relate to underlying muscular spasm. No acute fracture or listhesis of the lumbar spine. Vertebral body height has been preserved. There is mild intervertebral disc space narrowing and endplate remodeling at L2-3, L4-5, and L5-S1 in keeping with changes of mild degenerative disc disease. Remaining intervertebral disc heights are preserved. The oblique views demonstrate no evidence of pars defect. The paraspinal soft tissues are unremarkable. IMPRESSION: Mild multilevel degenerative disc disease. Mild straightening of the lumbar spine, possibly related to underlying muscular spasm. Electronically Signed   By: 12/12/2019 MD   On: 09/05/2020 05:39   MR Lumbar Spine W Wo Contrast  Result Date: 09/05/2020 CLINICAL DATA:  Low back pain and left hip pain. Suspect infection. History of HIV and diabetes EXAM: MRI LUMBAR SPINE WITHOUT AND WITH  CONTRAST TECHNIQUE: Multiplanar and multiecho pulse sequences of the lumbar spine were obtained without and with intravenous contrast. CONTRAST:  53mL GADAVIST GADOBUTROL 1 MMOL/ML IV SOLN COMPARISON:  Lumbar spine radiographs 09/05/2020 FINDINGS: Segmentation:  Normal Alignment:  Slight retrolisthesis L2-3 and L3-4 and L4-5 Vertebrae: Negative for fracture or mass. Discogenic edema on the right at L5-S1 most likely due to disc degeneration with associated disc space narrowing and sclerotic bone marrow changes. Disc space infection on the right not considered likely. Conus medullaris and cauda equina: Conus extends to the L1 level. Conus and  cauda equina appear normal. Paraspinal and other soft tissues: Negative for paraspinous mass, adenopathy, or fluid collection. Disc levels: L1-2: Negative L2-3: Mild retrolisthesis. Mild disc space narrowing and disc bulging without significant stenosis L3-4: Mild retrolisthesis. Mild disc and mild facet degeneration. Negative for stenosis L4-5: Extruded disc fragment on the left foramen with compression left L4 nerve root. Bilateral facet hypertrophy. Broad-based central disc protrusion. Moderate central canal stenosis. Severe subarticular stenosis on the left with left L5 nerve root impingement, and moderate to severe subarticular stenosis on the right L5-S1: Asymmetric disc space narrowing on the right. This is associated small amount of fluid in the disc space on the right. Left-sided disc space not significantly narrowed. There is central and right-sided disc protrusion with associated spurring. There is severe right foraminal encroachment with right L5 nerve root impingement. There is also right S1 nerve root impingement. IMPRESSION: Extruded disc fragment on the left at L4-5 with left L4 nerve root impingement. Broad-based disc protrusion with moderate spinal stenosis. Subarticular stenosis bilaterally, left greater than right Asymmetric disc space narrowing at L5-S1 with discogenic sclerosis and edema in the right most likely degenerative in nature. Infection not considered likely. There is central and right-sided disc protrusion and spurring with severe right foraminal encroachment right L5 nerve root impingement. There is also right S1 nerve root impingement. Electronically Signed   By: Marlan Palau M.D.   On: 09/05/2020 10:19   MR HIP LEFT W WO CONTRAST  Result Date: 09/05/2020 CLINICAL DATA:  Onset left hip pain approximately 2 days ago in an immunocompromised patient. No known injury. EXAM: MRI OF THE LEFT HIP WITHOUT AND WITH CONTRAST TECHNIQUE: Multiplanar, multisequence MR imaging was performed  both before and after administration of intravenous contrast. CONTRAST:  8 mL GADAVIST IV SOLN COMPARISON:  Plain films lumbar spine 09/05/2020. FINDINGS: Bones: Negative for osteomyelitis, fracture, stress change or worrisome bony lesion. 2-3 tiny subchondral cysts are seen in the anterior aspect of the left acetabulum. Endplate signal change eccentric to the right at L5-S1 is likely degenerative. See report of dedicated lumbar spine MRI today. No hip or SI joint effusion. No avascular necrosis of the femoral heads. Articular cartilage and labrum Articular cartilage:  Mildly degenerated. Labrum:  Intact. Joint or bursal effusion Joint effusion:  None. Bursae: Negative. Muscles and tendons Muscles and tendons:  Intact and normal in appearance. Other findings Miscellaneous:   None. IMPRESSION: Mild left hip degenerative disease. Otherwise negative. No evidence of infection or other acute abnormality. Electronically Signed   By: Drusilla Kanner M.D.   On: 09/05/2020 10:18   DG Hip Unilat W or Wo Pelvis 2-3 Views Left  Result Date: 09/05/2020 CLINICAL DATA:  Left hip pain EXAM: DG HIP (WITH OR WITHOUT PELVIS) 2-3V LEFT COMPARISON:  02/22/2020 FINDINGS: There is no evidence of hip fracture or dislocation. There is no evidence of arthropathy or other focal bone abnormality. IMPRESSION: Negative. Electronically Signed  By: Helyn Numbers MD   On: 09/05/2020 05:38     Labs Reviewed  CBC WITH DIFFERENTIAL/PLATELET - Abnormal; Notable for the following components:      Result Value   Hemoglobin 11.6 (*)    MCH 25.9 (*)    Eosinophils Absolute 0.6 (*)    All other components within normal limits  COMPREHENSIVE METABOLIC PANEL - Abnormal; Notable for the following components:   Potassium 3.4 (*)    CO2 21 (*)    Glucose, Bld 140 (*)    All other components within normal limits  URINALYSIS, ROUTINE W REFLEX MICROSCOPIC - Abnormal; Notable for the following components:   Leukocytes,Ua TRACE (*)    All other  components within normal limits  CULTURE, BLOOD (ROUTINE X 2) W REFLEX TO ID PANEL  LIPASE, BLOOD  C-REACTIVE PROTEIN  SEDIMENTATION RATE  I-STAT BETA HCG BLOOD, ED ([facility], [facility], [facility] ONLY)    MDM  FU on MRI's. If neg dc home with outpatient FU  MR left hip WO acute abnormality MR lumbar with disc protrusion, infection not likely  Ambulated to RR with staff without difficulty, Per nursing  Patient reassessed.  She is sleepy from the Ativan from her MRI.  She is neurovascularly intact.  She moves all 4 extremities without difficulty.  We will have her follow-up with PCP as well as given referral to neurosurgery.  Patient with back pain.  No neurological deficits and normal neuro exam.   No concern for cauda equina, status, osteomyelitis, transverse myelitis, intra-abdominal abscess.    The patient has been appropriately medically screened and/or stabilized in the ED. I have low suspicion for any other emergent medical condition which would require further screening, evaluation or treatment in the ED or require inpatient management.  Patient is hemodynamically stable and in no acute distress.  Patient able to ambulate in department prior to ED.  Evaluation does not show acute pathology that would require ongoing or additional emergent interventions while in the emergency department or further inpatient treatment.  I have discussed the diagnosis with the patient and answered all questions.  Pain is been managed while in the emergency department and patient has no further complaints prior to discharge.  Patient is comfortable with plan discussed in room and is stable for discharge at this time.  I have discussed strict return precautions for returning to the emergency department.  Patient was encouraged to follow-up with PCP/specialist refer to at discharge.      Florita Nitsch A, PA-C 09/05/20 [id number]    Geoffery Lyons, MD 09/06/20 [phone]

## 2020-09-05 NOTE — ED Triage Notes (Signed)
Patient reports left hip joint pain this week , history of bursitis , denies injury or fall , patient added mid abdominal pain , no emesis or diarrhea .

## 2020-09-05 NOTE — Discharge Instructions (Addendum)
Your MR showed a disc herniation in your lower back likely causing your pain.  Take the steroids as prescribed  Follow up with the neurosurgery group setting of discharge paperwork.  You may also follow-up with your primary care provider.  Return for new or worsening symptoms

## 2020-09-05 NOTE — ED Provider Notes (Signed)
[facility] EMERGENCY DEPARTMENT Provider Note   CSN: [id number] Arrival date & time: 09/05/20  0359     History Chief Complaint  Patient presents with   Hip Pain ( Bursitis) / Abdominal Pain     Kristin Hart is a 40 y.o. female with a history of HIV, methamphetamine use disorder, diabetes mellitus type 2, borderline personality disorder, hypothyroidism, gastric ulcer who presents to the emergency department with a chief complaint of left hip pain.   The patient endorses constant, stabbing, throbbing left hip pain that began approximately 2 days ago.  The patient reports that she likely over exerted herself cleaning her home.  She states that she was seen by her PCP several months ago with similar pain and was diagnosed with bursitis.  States the symptoms at that time did also come on after she overexerted herself.  Reports that she can stand and ambulate at baseline, but has been using a wheelchair over the last 2 days due to her pain.  She is endorsing increasing weakness in the left lower leg.  She also endorses low back pain, but is unsure how long it has been present.  She also notes that she has been more short of breath with a productive cough for several weeks.  No fever, chills, chest pain, abdominal pain, dysuria, hematuria, nausea, vomiting, diarrhea, constipation, left knee pain, right lower leg pain, leg swelling, paresthesias, numbness, or urinary or fecal incontinence.  The patient reports a history of frequent methamphetamine use.  Reports that she does have a history of IV drug use, states that she has not used IV drugs in several months.  She also has a history of HIV and reports the viral load is undetectable.  She is compliant with her home medications.  The history is provided by medical records and the patient. No language interpreter was used.      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   Screening for cervical cancer 04/21/2020   Screening for STDs (sexually transmitted diseases) 04/21/2020   Costochondritis 02/19/2020   Bilateral hip pain 02/19/2020   Elbow pain, right 12/04/2019   Housing problems 11/23/2019   Mental health disorder 10/12/2019   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   Paresthesias 10/12/2019   Healthcare maintenance 08/05/2019   HIV (human immunodeficiency virus infection) ([place]) 12/31/2018   Anxiety 05/29/2017   Self-mutilation 05/29/2017   Depression 06/07/2016   Marijuana use 06/07/2016   Low vitamin D level 03/15/2016   Prediabetes 02/22/2016   Asthma 02/22/2016   ADD (attention deficit disorder) 02/22/2016   Methamphetamine use disorder, severe, dependence ([place]) 02/22/2016   Hyperlipidemia with target LDL less than 70 01/31/2012   Migraine 01/31/2012   Tremor, 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    Past Surgical History:  Procedure Laterality Date   CESAREAN SECTION N/A 07/04/2016   Procedure: CESAREAN SECTION;  Surgeon: Truett Mainland, DO;  Location: [facility];  Service: Obstetrics;  Laterality: N/A;   ENTEROSCOPY N/A 11/08/2017   Procedure: ENTEROSCOPY;  Surgeon: Irene Shipper, MD;  Location: [facility] ENDOSCOPY;  Service: Endoscopy;  Laterality: N/A;   NO PAST SURGERIES       OB History     Gravida  1   Para  1   Term  1   Preterm      AB      Living         SAB      IAB      Ectopic      Multiple  0   Live Births              No family history on file.  Social  History   Tobacco Use   Smoking status: Never   Smokeless tobacco: Never  Vaping Use   Vaping Use: Never used  Substance Use Topics   Alcohol use: No   Drug use: Yes    Types: Methamphetamines, Marijuana    Comment: Couple of times per month    Home Medications Prior to Admission medications   Medication Sig Start Date End Date Taking? Authorizing Provider  albuterol (PROAIR HFA) 108 (90 Base) MCG/ACT inhaler Inhale 1 puff into the lungs every 4 (four) hours as needed. Patient taking differently: Inhale 1 puff into the lungs every 4 (four) hours as needed for wheezing or shortness of breath. 05/13/20  Yes Lilland, Alana, DO  bictegravir-emtricitabine-tenofovir AF (BIKTARVY) 50-200-25 MG TABS tablet Take 1 tablet by mouth daily. 07/29/20  Yes Golden Circle, FNP  cetirizine (ZYRTEC) 10 MG tablet Take 1 tablet (10 mg total) by mouth daily. 05/13/20  Yes Lilland, Alana, DO  cyclobenzaprine (FLEXERIL) 5 MG tablet Take 1 tablet (5 mg total) by mouth at bedtime. 07/13/20  Yes Milus Banister C, DO  diclofenac Sodium (VOLTAREN) 1 % GEL Apply 2 g topically 4 (four) times daily as needed (for elbow pain). As needed for hip pain. 04/13/20  Yes Lilland, Alana, DO  gabapentin (NEURONTIN) 100 MG capsule Take 1 capsule (100 mg total) by mouth 2 (two) times daily. 07/13/20  Yes Milus Banister C, DO  guaiFENesin (MUCUS RELIEF ADULT PO) Take 1 tablet by mouth 2 (two) times daily as needed (cough/congestion).   Yes [provider]  hydrocortisone cream 1 % Apply to affected area 2 times daily Patient taking differently: Apply 1 application topically 2 (two) times daily as needed for itching. 08/17/18  Yes Henderly, Britni A, PA-C  ibuprofen (ADVIL) 200 MG tablet Take 800 mg by mouth every 6 (six) hours as needed for headache or moderate pain.   Yes [provider]  Menthol, Topical Analgesic, (ICY HOT EX) Apply 1 application topically 2 (two) times daily as needed (pain).   Yes [provider]  naproxen (NAPROSYN) 500 MG tablet Take 1 tablet (500 mg total) by mouth 2 (two) times daily with a meal. 07/13/20  Yes Milus Banister C, DO  omeprazole (PRILOSEC) 40 MG capsule TAKE 1 CAPSULE BY MOUTH EVERY DAY Patient taking differently: Take 40 mg by mouth daily. 07/15/20  Yes Golden Circle, FNP  topiramate (TOPAMAX) 25 MG tablet Take 1 tablet (25 mg total) by mouth daily. 07/13/20  Yes Milus Banister C, DO  ibuprofen (ADVIL,MOTRIN) 600 MG tablet Take 1 tablet (600 mg total) by mouth every 6 (six) hours as needed. Patient not taking: Reported on 09/05/2020 07/07/16   Myrtis Ser, CNM    Allergies  Patient has no known allergies.  Review of Systems   Review of Systems  Constitutional:  Negative for activity change, chills and fever.  HENT:  Negative for congestion and sore throat.   Eyes:  Negative for visual disturbance.  Respiratory:  Negative for cough, shortness of breath and wheezing.   Cardiovascular:  Negative for chest pain and palpitations.  Gastrointestinal:  Negative for abdominal pain, diarrhea, nausea and vomiting.  Genitourinary:  Negative for dysuria, flank pain and urgency.  Musculoskeletal:  Positive for arthralgias, back pain and myalgias. Negative for gait problem, neck pain and neck stiffness.  Skin:  Negative for rash.  Allergic/Immunologic: Negative for immunocompromised state.  Neurological:  Negative for syncope, weakness, numbness and headaches.  Psychiatric/Behavioral:  Negative for confusion.    Physical Exam Updated Vital Signs BP (!) 154/107   Pulse 84   Temp 98.6 F (37 C) (Oral)   Resp 16   Ht 5' 7"  (1.702 m)   Wt 80 kg   SpO2 100%   BMI 27.62 kg/m   Physical Exam Vitals and nursing note reviewed.  Constitutional:      General: She is not in acute distress.    Appearance: She is not ill-appearing, toxic-appearing or diaphoretic.  HENT:     Head: Normocephalic.  Eyes:     Conjunctiva/sclera: Conjunctivae normal.   Cardiovascular:     Rate and Rhythm: Normal rate and regular rhythm.     Heart sounds: No murmur heard.   No friction rub. No gallop.  Pulmonary:     Effort: Pulmonary effort is normal. No respiratory distress.     Comments: Coarse breath sounds bilaterally.  No increased work of breathing. Abdominal:     General: There is no distension.     Palpations: Abdomen is soft.     Tenderness: There is no abdominal tenderness. There is no right CVA tenderness or guarding.  Musculoskeletal:     Cervical back: Neck supple.     Comments: Pain out of proportion to exam with palpation of the left hip diffusely.  No overlying erythema, edema, or warmth.  Range of motion is intact, but patient cries out in pain.  No tenderness to palpation to the left knee.  Full active and passive range of motion.  She has neurovascular intact to the bilateral lower extremities.  Tender to palpation diffusely to the spinous processes of the lumbar spine.  No crepitus or step-offs.  No tenderness to the thoracic or cervical spine.   Skin:    General: Skin is warm.     Findings: No rash.  Neurological:     Mental Status: She is alert.  Psychiatric:        Behavior: Behavior normal.    ED Results / Procedures / Treatments   Labs (all labs ordered are listed, but only abnormal results are displayed) Labs Reviewed  CBC WITH DIFFERENTIAL/PLATELET - Abnormal; Notable for the following components:      Result Value   Hemoglobin 11.6 (*)    MCH 25.9 (*)    Eosinophils Absolute 0.6 (*)    All other components within normal limits  COMPREHENSIVE METABOLIC PANEL - Abnormal; Notable for the following components:   Potassium 3.4 (*)    CO2 21 (*)    Glucose, Bld 140 (*)    All other components within normal limits  URINALYSIS, ROUTINE W REFLEX MICROSCOPIC - Abnormal; Notable for the following components:   Leukocytes,Ua TRACE (*)    All other components within normal  limits  LIPASE, BLOOD  C-REACTIVE PROTEIN   SEDIMENTATION RATE  I-STAT BETA HCG BLOOD, ED ([facility], [facility], [facility] ONLY)    EKG None  Radiology DG Chest 2 View  Result Date: 09/05/2020 CLINICAL DATA:  Productive cough EXAM: CHEST - 2 VIEW COMPARISON:  None. FINDINGS: The lungs are symmetrically mildly hyperinflated in keeping with changes of underlying COPD. The lungs are clear. No pneumothorax or pleural effusion. Cardiac size within normal limits. The pulmonary vascularity is normal. No acute bone abnormality. IMPRESSION: COPD with mild pulmonary hyperinflation. Electronically Signed   By: Fidela Salisbury MD   On: 09/05/2020 05:40   DG Lumbar Spine Complete  Result Date: 09/05/2020 CLINICAL DATA:  Low back pain EXAM: LUMBAR SPINE - COMPLETE 4+ VIEW COMPARISON:  10/12/2019 FINDINGS: Mild straightening of the lumbar spine may relate to underlying muscular spasm. No acute fracture or listhesis of the lumbar spine. Vertebral body height has been preserved. There is mild intervertebral disc space narrowing and endplate remodeling at G5-3, L4-5, and L5-S1 in keeping with changes of mild degenerative disc disease. Remaining intervertebral disc heights are preserved. The oblique views demonstrate no evidence of pars defect. The paraspinal soft tissues are unremarkable. IMPRESSION: Mild multilevel degenerative disc disease. Mild straightening of the lumbar spine, possibly related to underlying muscular spasm. Electronically Signed   By: Fidela Salisbury MD   On: 09/05/2020 05:39   DG Hip Unilat W or Wo Pelvis 2-3 Views Left  Result Date: 09/05/2020 CLINICAL DATA:  Left hip pain EXAM: DG HIP (WITH OR WITHOUT PELVIS) 2-3V LEFT COMPARISON:  02/22/2020 FINDINGS: There is no evidence of hip fracture or dislocation. There is no evidence of arthropathy or other focal bone abnormality. IMPRESSION: Negative. Electronically Signed   By: Fidela Salisbury MD   On: 09/05/2020 05:38    Procedures Procedures   Medications Ordered in ED Medications  LORazepam (ATIVAN)  injection 1 mg (has no administration in time range)  acetaminophen (TYLENOL) tablet 650 mg (650 mg Oral Given 09/05/20 0548)    ED Course  I have reviewed the triage vital signs and the nursing notes.  Pertinent labs & imaging results that were available during my care of the patient were reviewed by me and considered in my medical decision making (see chart for details).    MDM Rules/Calculators/A&P                          40 year old female with a history of HIV, methamphetamine use disorder, diabetes mellitus type 2, borderline personality disorder, hypothyroidism, gastric ulcer who presents the emergency department with a chief complaint of left hip pain that began 2 days ago.  Patient reports that she was cleaning 2 days ago, but denies any specific injury.  She has had difficulty with weightbearing and has been using a wheelchair for the last 2 days since her symptoms began.  Vital signs are stable.  On exam, she has pain in a proportion to palpation of the left hip as well as to the lumbar spine.  No neurologic deficits.  She has a history of IV drug use, but states last use was several months ago.  She does continue to use methamphetamine daily.  Given risk factors, there is concern for septic arthritis or bursitis.  Labs and imaging have been reviewed and independently interpreted by me.   No leukocytosis.  No metabolic derangements.  ESR and CRP are pending. Chest x-ray with COPD with mild hyperinflation.  Left  hip x-ray is unremarkable.  X-ray of the lumbar spine with mild multilevel degenerative disc disease.  There is also mild straightening of the lumbar spine concerning for underlying muscle spasm.  Per chart review, patient does have MRI of the left hip pending outpatient in 1 month.  However, given history of IV drug use and pain out of proportion on exam, will plan to order MR of the left hip and lumbar spines that she is having midline tenderness.  Patient care transferred  to PA Henderly at the end of my shift to follow up on labs and imaging. Patient presentation, ED course, and plan of care discussed with review of all pertinent labs and imaging. Please see his/her note for further details regarding further ED course and disposition.   Final Clinical Impression(s) / ED Diagnoses Final diagnoses:  None    Rx / DC Orders ED Discharge Orders     None        Dayanara Sherrill A, PA-C 09/05/20 [id number]    Veryl Speak, MD 09/06/20 [phone]

## 2020-09-07 ENCOUNTER — Other Ambulatory Visit: Payer: Self-pay | Admitting: Family

## 2020-09-07 DIAGNOSIS — Z21 Asymptomatic human immunodeficiency virus [HIV] infection status: Secondary | ICD-10-CM

## 2020-09-10 LAB — CULTURE, BLOOD (ROUTINE X 2)
Culture: NO GROWTH
Special Requests: ADEQUATE

## 2020-09-13 ENCOUNTER — Ambulatory Visit: Payer: Medicaid Other | Admitting: Family Medicine

## 2020-09-20 ENCOUNTER — Ambulatory Visit (INDEPENDENT_AMBULATORY_CARE_PROVIDER_SITE_OTHER): Payer: Medicaid Other | Admitting: Family Medicine

## 2020-09-20 ENCOUNTER — Other Ambulatory Visit: Payer: Self-pay

## 2020-09-20 ENCOUNTER — Encounter: Payer: Self-pay | Admitting: Family Medicine

## 2020-09-20 VITALS — BP 150/88 | HR 117 | Ht 67.5 in | Wt 148.4 lb

## 2020-09-20 DIAGNOSIS — M25552 Pain in left hip: Secondary | ICD-10-CM | POA: Diagnosis not present

## 2020-09-20 DIAGNOSIS — R030 Elevated blood-pressure reading, without diagnosis of hypertension: Secondary | ICD-10-CM | POA: Diagnosis not present
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
## 2020-09-20 NOTE — Assessment & Plan Note (Signed)
Poorly controlled hip pain. MRI of lumbar spine showed degenerative disc disease with some impingement of L4/L5/S1.  MRI of left hip showed degenerative changes.  On examination pain is out of proportion with hypersensitivity to light touch.  Pain pattern does not follow typical patterns for the impingement patient has.  Consideration that there may be a psoas muscle strain versus degenerative labral tear of the hip.  Feel the patient would benefit highly from further evaluation with sports medicine and likely physical therapy. -Ambulatory to sports medicine placed today

## 2020-09-20 NOTE — Patient Instructions (Signed)
For hip pain, I am sending a referral to sports medicine.  It be very importantly follow-up with them in order to see if there are any interventions that we can do or exercises that would be helpful.  There is some signs of arthritis in your hip as well as in your back (the arthritis in your back does have some nerve impingement which can be causing some similar pain).   We have other options to pursue if sports medicine does not end up working for you including physical therapy and another therapy technique as well.  I think this will be the best first step for [place] at this time.

## 2020-09-21 ENCOUNTER — Telehealth: Payer: Self-pay | Admitting: Family Medicine

## 2020-09-21 DIAGNOSIS — I10 Essential (primary) hypertension: Secondary | ICD-10-CM | POA: Insufficient documentation

## 2020-09-21 DIAGNOSIS — R030 Elevated blood-pressure reading, without diagnosis of hypertension: Secondary | ICD-10-CM | POA: Insufficient documentation

## 2020-09-21 NOTE — Assessment & Plan Note (Signed)
BP elevated to 150/88, patient has previously reported that her BP is usually elevated in offices because she typically uses meth before her visits due to her anxiety. Unsure if patient did today, but very likely that reading is elevated in the setting of significant pain along with patient's tachycardia. Not sure if patient would be a great candidate for blood pressure medication at this time due to other social stressors including financial but will discuss further at next appointment.  - Will continue to monitor - Continue to encourage patient to not use substances prior to visits that may alter readings.

## 2020-09-21 NOTE — Telephone Encounter (Signed)
Called and left generic voicemail for patient to call back.  Nursing if patient calls back, please identify which times would be best for her to take a call. Dr. Clayborne Artist or I will call her to check in on symptoms (had elevated HR and BP at visit yesterday).  Terisa Starr, MD  Family Medicine Teaching Service

## 2020-09-23 ENCOUNTER — Other Ambulatory Visit: Payer: Self-pay | Admitting: Family Medicine

## 2020-09-23 DIAGNOSIS — M25551 Pain in right hip: Secondary | ICD-10-CM

## 2020-09-23 DIAGNOSIS — M25552 Pain in left hip: Secondary | ICD-10-CM

## 2020-09-23 NOTE — Telephone Encounter (Signed)
Patient returns call to nurse line. Patient reports the best number to reach her at is listed below. This number is attached to [employer]. Patient reports she should be there. Patient denies any chest pain, SOB, headaches, or rapid hear beats at this time.    [phone]

## 2020-09-27 ENCOUNTER — Ambulatory Visit: Payer: Medicaid Other

## 2020-09-27 ENCOUNTER — Ambulatory Visit: Payer: Medicaid Other | Admitting: Family

## 2020-10-20 ENCOUNTER — Ambulatory Visit: Payer: Medicaid Other

## 2020-10-20 ENCOUNTER — Ambulatory Visit: Payer: Medicaid Other | Admitting: Family

## 2020-10-22 ENCOUNTER — Emergency Department (HOSPITAL_COMMUNITY)
Admission: EM | Admit: 2020-10-22 | Discharge: 2020-10-22 | Disposition: A | Discharge status: Home / Self Care | Payer: Medicaid Other | Attending: Emergency Medicine | Admitting: Emergency Medicine

## 2020-10-22 ENCOUNTER — Other Ambulatory Visit: Payer: Self-pay

## 2020-10-22 DIAGNOSIS — E119 Type 2 diabetes mellitus without complications: Secondary | ICD-10-CM | POA: Insufficient documentation

## 2020-10-22 DIAGNOSIS — E039 Hypothyroidism, unspecified: Secondary | ICD-10-CM | POA: Insufficient documentation

## 2020-10-22 DIAGNOSIS — J45909 Unspecified asthma, uncomplicated: Secondary | ICD-10-CM | POA: Insufficient documentation

## 2020-10-22 DIAGNOSIS — Z21 Asymptomatic human immunodeficiency virus [HIV] infection status: Secondary | ICD-10-CM | POA: Insufficient documentation

## 2020-10-22 DIAGNOSIS — K029 Dental caries, unspecified: Secondary | ICD-10-CM | POA: Diagnosis not present

## 2020-10-22 DIAGNOSIS — K0889 Other specified disorders of teeth and supporting structures: Secondary | ICD-10-CM | POA: Diagnosis not present

## 2020-10-22 MED ORDER — IBUPROFEN 600 MG PO TABS: 600.0000 mg | ORAL_TABLET | Freq: Four times a day (QID) | ORAL | 0 refills | Status: DC | PRN

## 2020-10-22 MED ORDER — HYDROCODONE-ACETAMINOPHEN 5-325 MG PO TABS
1.0000 | ORAL_TABLET | Freq: Once | ORAL | Status: AC
Start: 2020-10-22 — End: 2020-10-22
  Administered 2020-10-22: 1 via ORAL
  Filled 2020-10-22: qty 1

## 2020-10-22 MED ORDER — PENICILLIN V POTASSIUM 500 MG PO TABS: 500.0000 mg | ORAL_TABLET | Freq: Four times a day (QID) | ORAL | 0 refills | Status: AC

## 2020-10-22 MED ORDER — PENICILLIN V POTASSIUM 250 MG PO TABS
500.0000 mg | ORAL_TABLET | Freq: Once | ORAL | Status: AC
  Administered 2020-10-22: 500 mg via ORAL
  Filled 2020-10-22: qty 2

## 2020-10-22 MED ORDER — IBUPROFEN 400 MG PO TABS
600.0000 mg | ORAL_TABLET | Freq: Once | ORAL | Status: AC
  Administered 2020-10-22: 600 mg via ORAL
  Filled 2020-10-22: qty 1

## 2020-10-22 NOTE — Discharge Instructions (Addendum)
Take the medications as prescribed. It will be important to follow up with a dentist to have the necessary treatment done on the decayed tooth.

## 2020-10-22 NOTE — ED Triage Notes (Signed)
Pt reported to ED with c/o dental abscess with swelling into face x1 week. Pt presents with no distress at time of triage.

## 2020-10-22 NOTE — ED Provider Notes (Signed)
[facility] EMERGENCY DEPARTMENT Provider Note   CSN: [id number] Arrival date & time: 10/22/20  0630     History Chief Complaint  Patient presents with   Abscess    Kristin Hart is a 40 y.o. female.  Patient to ED with dental pain for the past one week with intermittent facial swelling. No fever. Reports history of 'undetectable' HIV, asthma, T2DM. No fever. No difficulty swallowing.   The history is provided by the patient. No language interpreter was used.  Abscess Associated symptoms: no fever and no headaches       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   Screening for cervical cancer 04/21/2020   Screening for STDs (sexually transmitted diseases) 04/21/2020   Costochondritis 02/19/2020   Bilateral hip pain 02/19/2020   Elbow pain, right 12/04/2019   Housing problems 11/23/2019   Mental health disorder 10/12/2019   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   Paresthesias 10/12/2019   Healthcare maintenance 08/05/2019   HIV (human immunodeficiency virus infection) (HCC) 12/31/2018   Anxiety 05/29/2017   Self-mutilation 05/29/2017   Depression 06/07/2016   Marijuana use 06/07/2016   Low vitamin D level 03/15/2016   Prediabetes 02/22/2016   Asthma 02/22/2016   ADD (attention deficit disorder) 02/22/2016   Methamphetamine use disorder, severe, 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/22/2016    Hyperlipidemia with target LDL less than 70 01/31/2012   Migraine 01/31/2012   Tremor, 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    Past Surgical History:  Procedure Laterality Date   CESAREAN SECTION N/A 07/04/2016   Procedure: CESAREAN SECTION;  Surgeon: [name], DO;  Location: [facility] BIRTHING SUITES;  Service: Obstetrics;  Laterality: N/A;   ENTEROSCOPY N/A 11/08/2017   Procedure: ENTEROSCOPY;  Surgeon: Hilarie Fredrickson, MD;  Location: [facility] ENDOSCOPY;  Service: Endoscopy;  Laterality: N/A;   NO PAST SURGERIES       OB History     Gravida  1   Para  1   Term  1   Preterm      AB      Living         SAB      IAB      Ectopic      Multiple  0   Live Births              No family history on file.  Social History   Tobacco Use   Smoking status: Never   Smokeless tobacco: Never  Vaping Use   Vaping Use: Never used  Substance Use Topics   Alcohol use: No   Drug use: Yes    Types: Methamphetamines, Marijuana    Comment: Couple of times per month    Home Medications Prior to Admission medications   Medication Sig Start Date End Date Taking? Authorizing Provider  albuterol (PROAIR HFA) 108 (90 Base) MCG/ACT inhaler Inhale 1 puff into  the lungs every 4 (four) hours as needed. Patient taking differently: Inhale 1 puff into the lungs every 4 (four) hours as needed for wheezing or shortness of breath. 05/13/20   Lilland, Alana, DO  bictegravir-emtricitabine-tenofovir AF (BIKTARVY) 50-200-25 MG TABS tablet Take 1 tablet by mouth daily. 07/29/20   Veryl Speak, FNP  cetirizine (ZYRTEC) 10 MG tablet Take 1 tablet (10 mg total) by mouth daily. 05/13/20   Lilland, Alana, DO  cyclobenzaprine (FLEXERIL) 5 MG tablet Take 1 tablet (5 mg total) by mouth at bedtime. 07/13/20   Dollene Cleveland, DO  diclofenac Sodium (VOLTAREN) 1 % GEL Apply 2 g topically 4 (four) times daily as needed (for elbow pain). As needed for hip pain. 04/13/20   Lilland, Alana, DO  gabapentin (NEURONTIN) 100 MG capsule  TAKE 1 CAPSULE BY MOUTH TWICE A DAY 09/27/20   Lilland, Alana, DO  guaiFENesin (MUCUS RELIEF ADULT PO) Take 1 tablet by mouth 2 (two) times daily as needed (cough/congestion).    [provider]  hydrocortisone cream 1 % Apply to affected area 2 times daily Patient taking differently: Apply 1 application topically 2 (two) times daily as needed for itching. 08/17/18   Henderly, Britni A, PA-C  ibuprofen (ADVIL) 200 MG tablet Take 800 mg by mouth every 6 (six) hours as needed for headache or moderate pain.    [provider]  ibuprofen (ADVIL,MOTRIN) 600 MG tablet Take 1 tablet (600 mg total) by mouth every 6 (six) hours as needed. Patient not taking: Reported on 09/05/2020 07/07/16   Arabella Merles, CNM  Menthol, Topical Analgesic, (ICY HOT EX) Apply 1 application topically 2 (two) times daily as needed (pain).    [provider]  naproxen (NAPROSYN) 500 MG tablet TAKE 1 TABLET BY MOUTH 2 TIMES DAILY WITH A MEAL. 09/27/20   Lilland, Alana, DO  omeprazole (PRILOSEC) 40 MG capsule TAKE 1 CAPSULE BY MOUTH EVERY DAY Patient taking differently: Take 40 mg by mouth daily. 07/15/20   Veryl Speak, FNP  topiramate (TOPAMAX) 25 MG tablet Take 1 tablet (25 mg total) by mouth daily. 07/13/20   Dollene Cleveland, DO    Allergies    Patient has no known allergies.  Review of Systems   Review of Systems  Constitutional:  Negative for chills and fever.  HENT:  Positive for dental problem and facial swelling. Negative for sore throat and trouble swallowing.   Gastrointestinal: Negative.   Musculoskeletal: Negative.  Negative for neck pain and neck stiffness.  Skin: Negative.  Negative for color change.  Neurological: Negative.  Negative for headaches.   Physical Exam Updated Vital Signs BP (!) 152/99   Pulse 92   Temp 98.2 F (36.8 C) (Oral)   Resp 18   Ht 5' 7.5" (1.715 m)   Wt 70.3 kg   SpO2 100%   BMI 23.92 kg/m   Physical Exam Vitals and nursing note reviewed.   Constitutional:      General: She is not in acute distress.    Appearance: Normal appearance. She is not ill-appearing.  HENT:     Head: Normocephalic.     Comments: No facial swelling.     Mouth/Throat:     Comments: Severe decay of #11 without visualized abscess.  Cardiovascular:     Rate and Rhythm: Normal rate.  Pulmonary:     Effort: Pulmonary effort is normal.  Musculoskeletal:        General: Normal range of motion.  Skin:  General: Skin is warm and dry.  Neurological:     Mental Status: She is alert.    ED Results / Procedures / Treatments   Labs (all labs ordered are listed, but only abnormal results are displayed) Labs Reviewed - No data to display  EKG None  Radiology No results found.  Procedures Procedures   Medications Ordered in ED Medications - No data to display  ED Course  I have reviewed the triage vital signs and the nursing notes.  Pertinent labs & imaging results that were available during my care of the patient were reviewed by me and considered in my medical decision making (see chart for details).    MDM Rules/Calculators/A&P                           Patient to ED with dental pain involving decayed tooth for the past several days. No fever.   Will start on PCN. Recommend continuation of ibuprofen. Follow up with dentistry.   Final Clinical Impression(s) / ED Diagnoses Final diagnoses:  None   Dental pain Dental decay  Rx / DC Orders ED Discharge Orders     None        Danne Harbor 10/22/20 0518    Nira Conn, MD 10/23/20 2119

## 2020-11-24 ENCOUNTER — Other Ambulatory Visit: Payer: Self-pay

## 2020-11-24 ENCOUNTER — Emergency Department (HOSPITAL_COMMUNITY)
Admission: EM | Admit: 2020-11-24 | Discharge: 2020-11-24 | Disposition: A | Discharge status: Home / Self Care | Payer: Medicaid Other | Attending: Emergency Medicine | Admitting: Emergency Medicine

## 2020-11-24 ENCOUNTER — Encounter (HOSPITAL_COMMUNITY): Payer: Self-pay | Admitting: Emergency Medicine

## 2020-11-24 DIAGNOSIS — L03116 Cellulitis of left lower limb: Secondary | ICD-10-CM | POA: Diagnosis not present

## 2020-11-24 DIAGNOSIS — A46 Erysipelas: Secondary | ICD-10-CM | POA: Diagnosis not present

## 2020-11-24 DIAGNOSIS — L03115 Cellulitis of right lower limb: Secondary | ICD-10-CM | POA: Insufficient documentation

## 2020-11-24 DIAGNOSIS — Z21 Asymptomatic human immunodeficiency virus [HIV] infection status: Secondary | ICD-10-CM | POA: Diagnosis not present

## 2020-11-24 DIAGNOSIS — E1169 Type 2 diabetes mellitus with other specified complication: Secondary | ICD-10-CM | POA: Diagnosis not present

## 2020-11-24 DIAGNOSIS — E039 Hypothyroidism, unspecified: Secondary | ICD-10-CM | POA: Insufficient documentation

## 2020-11-24 DIAGNOSIS — M79661 Pain in right lower leg: Secondary | ICD-10-CM | POA: Diagnosis present

## 2020-11-24 DIAGNOSIS — E785 Hyperlipidemia, unspecified: Secondary | ICD-10-CM | POA: Diagnosis not present

## 2020-11-24 DIAGNOSIS — I10 Essential (primary) hypertension: Secondary | ICD-10-CM | POA: Diagnosis not present

## 2020-11-24 DIAGNOSIS — J45909 Unspecified asthma, uncomplicated: Secondary | ICD-10-CM | POA: Diagnosis not present

## 2020-11-24 LAB — CBC WITH DIFFERENTIAL/PLATELET
Abs Immature Granulocytes: 0.03 10*3/uL (ref 0.00–0.07)
Basophils Absolute: 0 10*3/uL (ref 0.0–0.1)
Basophils Relative: 0 %
Eosinophils Absolute: 0.6 10*3/uL — ABNORMAL HIGH (ref 0.0–0.5)
Eosinophils Relative: 6 %
HCT: 36 % (ref 36.0–46.0)
Hemoglobin: 11.2 g/dL — ABNORMAL LOW (ref 12.0–15.0)
Immature Granulocytes: 0 %
Lymphocytes Relative: 16 %
Lymphs Abs: 1.5 10*3/uL (ref 0.7–4.0)
MCH: 26.5 pg (ref 26.0–34.0)
MCHC: 31.1 g/dL (ref 30.0–36.0)
MCV: 85.3 fL (ref 80.0–100.0)
Monocytes Absolute: 0.8 10*3/uL (ref 0.1–1.0)
Monocytes Relative: 9 %
Neutro Abs: 6.2 10*3/uL (ref 1.7–7.7)
Neutrophils Relative %: 69 %
Platelets: 280 10*3/uL (ref 150–400)
RBC: 4.22 MIL/uL (ref 3.87–5.11)
RDW: 13.1 % (ref 11.5–15.5)
WBC: 9.2 10*3/uL (ref 4.0–10.5)
nRBC: 0 % (ref 0.0–0.2)

## 2020-11-24 LAB — BASIC METABOLIC PANEL
Anion gap: 9 (ref 5–15)
BUN: 13 mg/dL (ref 6–20)
CO2: 23 mmol/L (ref 22–32)
Calcium: 8.9 mg/dL (ref 8.9–10.3)
Chloride: 104 mmol/L (ref 98–111)
Creatinine, Ser: 0.92 mg/dL (ref 0.44–1.00)
GFR, Estimated: >60 mL/min (ref >60)
Glucose, Bld: 260 mg/dL — ABNORMAL HIGH (ref 70–99)
Potassium: 3.3 mmol/L — ABNORMAL LOW (ref 3.5–5.1)
Sodium: 136 mmol/L (ref 135–145)

## 2020-11-24 MED ORDER — MORPHINE SULFATE (PF) 4 MG/ML IV SOLN
4.0000 mg | Freq: Once | INTRAVENOUS | Status: AC
Start: 2020-11-24 — End: 2020-11-24
  Administered 2020-11-24: 4 mg via INTRAVENOUS
  Filled 2020-11-24: qty 1

## 2020-11-24 MED ORDER — DOXYCYCLINE HYCLATE 100 MG PO CAPS: 100.0000 mg | ORAL_CAPSULE | Freq: Two times a day (BID) | ORAL | 0 refills | Status: DC

## 2020-11-24 MED ORDER — AMOXICILLIN-POT CLAVULANATE 875-125 MG PO TABS: 1.0000 | ORAL_TABLET | Freq: Two times a day (BID) | ORAL | 0 refills | Status: DC

## 2020-11-24 MED ORDER — SODIUM CHLORIDE 0.9 % IV BOLUS
1000.0000 mL | Freq: Once | INTRAVENOUS | Status: AC
  Administered 2020-11-24: 1000 mL via INTRAVENOUS

## 2020-11-24 NOTE — Discharge Instructions (Addendum)
Please take your antibiotics as prescribed.  You will take each medication in the morning and at night.  Both of these medications will last for 7 days.  Please read about your condition and the attached discharge papers.  It was a pleasure to meet you and I hope that you feel better.

## 2020-11-24 NOTE — ED Triage Notes (Signed)
Pt states her bilateral ankles and lower legs have been swelling over the past two days. They are red and hot to the touch. Pt states it is very painful.

## 2020-11-24 NOTE — ED Provider Notes (Signed)
[facility] EMERGENCY DEPARTMENT Provider Note   CSN: [id number] Arrival date & time: 11/24/20  1024     History Chief Complaint  Patient presents with   Leg Swelling    Kristin Hart is a 40 y.o. female pmh HIV presenting today with a complaint of bilateral lower leg pain.  Patient reports that for the past week she has noticed red areas that are warm and painful lateral calfs.  Denies fever, endorses some chills.  Reports that she is currently living in a group home that is dirty.  Reports being bitten by a rat around a month ago.  Did not notice the symptoms began until 2 weeks after the bite.  No recent illness.  Patient admits to daily meth use.  Reports that she smokes meth daily, last use this morning.  Pain 10/10.      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   Screening for cervical cancer 04/21/2020   Screening for STDs (sexually transmitted diseases) 04/21/2020   Costochondritis 02/19/2020   Bilateral hip pain 02/19/2020   Elbow pain, right 12/04/2019   Housing problems 11/23/2019   Mental health disorder 10/12/2019   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   Paresthesias 10/12/2019   Healthcare maintenance 08/05/2019   HIV (human immunodeficiency virus infection) (HCC) 12/31/2018   Anxiety 05/29/2017   Self-mutilation 05/29/2017   Depression 06/07/2016   Marijuana use 06/07/2016    Low vitamin D level 03/15/2016   Prediabetes 02/22/2016   Asthma 02/22/2016   ADD (attention deficit disorder) 02/22/2016   Methamphetamine use disorder, severe, 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/22/2016   Hyperlipidemia with target LDL less than 70 01/31/2012   Migraine 01/31/2012   Tremor, 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    Past Surgical History:  Procedure Laterality Date   CESAREAN SECTION N/A 07/04/2016   Procedure: CESAREAN SECTION;  Surgeon: [name], DO;  Location: [facility] BIRTHING SUITES;  Service: Obstetrics;  Laterality: N/A;   ENTEROSCOPY N/A 11/08/2017   Procedure: ENTEROSCOPY;  Surgeon: Hilarie Fredrickson, MD;  Location: [facility] ENDOSCOPY;  Service: Endoscopy;  Laterality: N/A;   NO PAST SURGERIES       OB History     Gravida  1   Para  1   Term  1   Preterm      AB      Living         SAB      IAB      Ectopic      Multiple  0   Live Births              No family history on file.  Social History   Tobacco Use   Smoking status: Never   Smokeless tobacco: Never  Vaping Use   Vaping Use: Never used  Substance Use Topics   Alcohol use: No   Drug use: Yes  Types: Methamphetamines, Marijuana    Comment: Couple of times per month    Home Medications Prior to Admission medications   Medication Sig Start Date End Date Taking? Authorizing Provider  amoxicillin-clavulanate (AUGMENTIN) 875-125 MG tablet Take 1 tablet by mouth 2 (two) times daily. 11/24/20  Yes Leonore Frankson A, PA-C  doxycycline (VIBRAMYCIN) 100 MG capsule Take 1 capsule (100 mg total) by mouth 2 (two) times daily. 11/24/20  Yes Jaylnn Ullery A, PA-C  albuterol (PROAIR HFA) 108 (90 Base) MCG/ACT inhaler Inhale 1 puff into the lungs every 4 (four) hours as needed. Patient taking differently: Inhale 1 puff into the lungs every 4 (four) hours as needed for wheezing or shortness of breath. 05/13/20   Lilland, Alana, DO  bictegravir-emtricitabine-tenofovir AF (BIKTARVY) 50-200-25 MG TABS tablet Take 1 tablet by  mouth daily. 07/29/20   Veryl Speak, FNP  cetirizine (ZYRTEC) 10 MG tablet Take 1 tablet (10 mg total) by mouth daily. 05/13/20   Lilland, Alana, DO  cyclobenzaprine (FLEXERIL) 5 MG tablet Take 1 tablet (5 mg total) by mouth at bedtime. 07/13/20   Dollene Cleveland, DO  diclofenac Sodium (VOLTAREN) 1 % GEL Apply 2 g topically 4 (four) times daily as needed (for elbow pain). As needed for hip pain. 04/13/20   Lilland, Alana, DO  gabapentin (NEURONTIN) 100 MG capsule TAKE 1 CAPSULE BY MOUTH TWICE A DAY 09/27/20   Lilland, Alana, DO  guaiFENesin (MUCUS RELIEF ADULT PO) Take 1 tablet by mouth 2 (two) times daily as needed (cough/congestion).    [provider]  hydrocortisone cream 1 % Apply to affected area 2 times daily Patient taking differently: Apply 1 application topically 2 (two) times daily as needed for itching. 08/17/18   Henderly, Britni A, PA-C  ibuprofen (ADVIL) 600 MG tablet Take 1 tablet (600 mg total) by mouth every 6 (six) hours as needed. 10/22/20   Elpidio Anis, PA-C  Menthol, Topical Analgesic, (ICY HOT EX) Apply 1 application topically 2 (two) times daily as needed (pain).    [provider]  naproxen (NAPROSYN) 500 MG tablet TAKE 1 TABLET BY MOUTH 2 TIMES DAILY WITH A MEAL. 09/27/20   Lilland, Alana, DO  omeprazole (PRILOSEC) 40 MG capsule TAKE 1 CAPSULE BY MOUTH EVERY DAY Patient taking differently: Take 40 mg by mouth daily. 07/15/20   Veryl Speak, FNP  topiramate (TOPAMAX) 25 MG tablet Take 1 tablet (25 mg total) by mouth daily. 07/13/20   Dollene Cleveland, DO    Allergies    Patient has no known allergies.  Review of Systems   Review of Systems  Constitutional:  Positive for chills. Negative for fever.  Skin:  Positive for color change and rash.  Neurological:  Negative for dizziness, weakness and numbness.  All other systems reviewed and are negative.  Physical Exam Updated Vital Signs BP (!) 152/96 (BP Location: Right Arm)   Pulse 95   Temp  99.2 F (37.3 C)   Resp 18   SpO2 100%   Physical Exam Vitals and nursing note reviewed.  Constitutional:      Appearance: Normal appearance.  HENT:     Head: Normocephalic and atraumatic.  Eyes:     General: No scleral icterus.    Conjunctiva/sclera: Conjunctivae normal.  Cardiovascular:     Rate and Rhythm: Normal rate and regular rhythm.  Pulmonary:     Effort: Pulmonary effort is normal. No respiratory distress.     Breath sounds: Normal breath sounds.  Musculoskeletal:  General: Tenderness present. No signs of injury.     Right lower leg: Edema present.     Left lower leg: Edema present.     Comments: 1+ pitting edema in lower bilateral extremities with  Skin:    Findings: No rash.     Comments: Patient with well demarcated red patches to the medial aspect of her calves bilaterally.  No fevers erysipelas.  Warm and tender to the touch.  Neurological:     Mental Status: She is alert.  Psychiatric:        Mood and Affect: Mood normal.     Comments: Patient very anxious and crying    ED Results / Procedures / Treatments   Labs (all labs ordered are listed, but only abnormal results are displayed) Labs Reviewed  CBC WITH DIFFERENTIAL/PLATELET - Abnormal; Notable for the following components:      Result Value   Hemoglobin 11.2 (*)    Eosinophils Absolute 0.6 (*)    All other components within normal limits  BASIC METABOLIC PANEL - Abnormal; Notable for the following components:   Potassium 3.3 (*)    Glucose, Bld 260 (*)    All other components within normal limits    EKG None  Radiology No results found.  Procedures Procedures   Medications Ordered in ED Medications  sodium chloride 0.9 % bolus 1,000 mL (has no administration in time range)  morphine 4 MG/ML injection 4 mg (has no administration in time range)    ED Course  I have reviewed the triage vital signs and the nursing notes.  Pertinent labs & imaging results that were available  during my care of the patient were reviewed by me and considered in my medical decision making (see chart for details).    MDM Rules/Calculators/A&P Kristin Hart is a 40 y.o. female pmh HIV presenting today with a complaint of bilateral lower leg pain.  Patient reports that for the past week she has noticed red areas that are warm and painful lateral calfs.  Denies fever, endorses some chills.  Reports that she is currently living in a group home that is dirty.  Reports being bitten by a rat around a month ago.  Did not notice the symptoms began until 2 weeks after the bite.  No recent illness.  Patient admits to daily meth use.  Reports that she smokes meth daily, last use this morning.  Pain 10/10.  Patient treated with IVF and 4 mg of morphine.  Patient without signs of systemic infection.  No concern for deep tissue, muscle or bone involvement.  No fever and normal white count.  I considered admission with IV antibiotics for this patient however because she showed no signs of systemic infection and reported stability picking up medication I believe she is stable to be discharged home with antibiotic treatment.  Patient is HIV positive however she reports taking her medication every day on time.  Last CD4 counts both normal.  I do not believe the patient needs to be admitted for this infection of her skin. I discussed this patient with DO Horton who agrees with this plan. Return precautions were discussed and attached to her discharge papers.  Final Clinical Impression(s) / ED Diagnoses Final diagnoses:  Erysipelas of both lower extremities    Rx / DC Orders ED Discharge Orders          Ordered    amoxicillin-clavulanate (AUGMENTIN) 875-125 MG tablet  2 times daily  11/24/20 1652    doxycycline (VIBRAMYCIN) 100 MG capsule  2 times daily        11/24/20 1652             Yvonne Petite, [place] A, PA-C 11/24/20 1905    Mancel Bale, MD 11/26/20 1138

## 2020-11-24 NOTE — Social Work (Signed)
CSW met with Pt at bedside. Pt reports that she needs transportation for d/c.  CSW gave bus pass to assist with getting to [facility] on [address] to pick up prescription as well as a buspass to get to where she is staying after picking up meds.  CSW and Pt also discussed Pt's substance use.  Pt endorses meth use but declined SUD resources. Pt given condoms at d/c for personal care.  Sack lunch also provided.

## 2020-11-24 NOTE — ED Provider Notes (Signed)
Emergency Medicine Provider Triage Evaluation Note  Kristin Hart , a 40 y.o. female  was evaluated in triage.  Pt complains of 2 days of lower extremity and swelling, warmth and redness.  Occasional chills has not measured a fever.  Has never had this happen before.  Admits to meth use.  Review of Systems  Positive: Leg pain and swelling Negative: Shortness of breath or fevers  Physical Exam  BP (!) 148/92 (BP Location: Left Arm)   Pulse 82   Temp 99.2 F (37.3 C)   Resp 18   SpO2 100%  Gen:   Awake, no distress   Resp:  Normal effort  MSK:   Moves extremities without difficulty  Other:  Patient with 1+ pitting edema in bilateral lower extremities.  Warm areas of cellulitis with erythema and tenderness.  Worse on right leg  Medical Decision Making  Medically screening exam initiated at 10:56 AM.  Appropriate orders placed.  Kristin Hart was informed that the remainder of the evaluation will be completed by another provider, this initial triage assessment does not replace that evaluation, and the importance of remaining in the ED until their evaluation is complete.     Saddie Benders, PA-C 11/24/20 1058    Rozelle Logan, DO 11/24/20 1310

## 2020-12-02 ENCOUNTER — Encounter
Payer: Medicaid Other | Attending: Physical Medicine and Rehabilitation | Admitting: Physical Medicine and Rehabilitation

## 2020-12-02 ENCOUNTER — Encounter: Payer: Self-pay | Admitting: Physical Medicine and Rehabilitation

## 2020-12-02 ENCOUNTER — Other Ambulatory Visit: Payer: Self-pay

## 2020-12-02 VITALS — BP 142/86 | HR 104 | Temp 98.1°F | Ht 67.75 in | Wt 157.6 lb

## 2020-12-02 DIAGNOSIS — M25552 Pain in left hip: Secondary | ICD-10-CM | POA: Insufficient documentation

## 2020-12-02 DIAGNOSIS — R600 Localized edema: Secondary | ICD-10-CM | POA: Diagnosis not present

## 2020-12-02 DIAGNOSIS — M25551 Pain in right hip: Secondary | ICD-10-CM | POA: Diagnosis not present

## 2020-12-02 MED ORDER — FUROSEMIDE 20 MG PO TABS: 20.0000 mg | ORAL_TABLET | Freq: Every day | ORAL | 0 refills | Status: DC

## 2020-12-02 NOTE — Progress Notes (Signed)
Subjective:    Patient ID: Kristin Hart, female    DOB: 12-Jan-1981, 40 y.o.   MRN: [id number]  HPI Kristin Hart, Kristin Hart is a 40 year old woman who presents for bilateral lower extremity pain and swelling. She has been wearing compression garments. No history of cancer or CHF.   She was also having severe left sided hip pain, and this sometimes occurs on the right side. She has not had recent imaging of her hips. She was wheelchair bound for some time due to the severity of her pain and this improved on its own.   Pain Inventory Average Pain 8 Pain Right Now 7 My pain is sharp, burning, tingling, and aching  In the last 24 hours, has pain interfered with the following? General activity 5 Relation with others 5 Enjoyment of life 5 What TIME of day is your pain at its worst? daytime Sleep (in general) Fair  Pain is worse with: walking, bending, sitting, inactivity, standing, and some activites Pain improves with: rest, heat/ice, therapy/exercise, pacing activities, and medication Relief from Meds: 8  walk without assistance use a cane ability to climb steps?  yes do you drive?  no  not employed: date last employed .  spasms dizziness depression anxiety  Any changes since last visit?  no  Any changes since last visit?  no    No family history on file. Social History   Socioeconomic History   Marital status: Single    Spouse name: Not on file   Number of children: 1   Years of education: Not on file   Highest education level: Not on file  Occupational History   Occupation: Unemployed   Tobacco Use   Smoking status: Never   Smokeless tobacco: Never  Vaping Use   Vaping Use: Never used  Substance and Sexual Activity   Alcohol use: No   Drug use: Yes    Types: Methamphetamines, Marijuana    Comment: Couple of times per month   Sexual activity: Yes    Partners: Male    Birth control/protection: Injection    Comment: pt given condoms 07/29/20  Other Topics  Concern   Not on file  Social History Narrative   Not on file   Social Determinants of Health   Financial Resource Strain: Not on file  Food Insecurity: Not on file  Transportation Needs: Not on file  Physical Activity: Not on file  Stress: Not on file  Social Connections: Not on file   Past Surgical History:  Procedure Laterality Date   CESAREAN SECTION N/A 07/04/2016   Procedure: CESAREAN SECTION;  Surgeon: [name], DO;  Location: [facility] BIRTHING SUITES;  Service: Obstetrics;  Laterality: N/A;   ENTEROSCOPY N/A 11/08/2017   Procedure: ENTEROSCOPY;  Surgeon: Hilarie Fredrickson, MD;  Location: [facility] ENDOSCOPY;  Service: Endoscopy;  Laterality: N/A;   NO PAST SURGERIES     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, per pt   Diabetes mellitus without complication (HCC)    type 2   Foreign body in small intestine    HIV (human immunodeficiency virus infection) (HCC)    Hypothyroidism    Stomach ulcer    BP (!) 142/86   Pulse (!) 104   Temp 98.1 F (36.7 C)   Ht 5' 7.75" (1.721 m)   Wt 157 lb 9.6 oz (71.5 kg)   SpO2 97%   BMI 24.14  kg/m   Opioid Risk Score:   Fall Risk Score:  `1  Depression screen PHQ 2/9  Depression screen [facility] 2/9 12/02/2020 07/29/2020 07/13/2020 07/13/2020 06/08/2020 05/13/2020 04/13/2020  Decreased Interest 0 0 3 3 0 0 1  Down, Depressed, Hopeless 1 0 3 3 0 1 1  PHQ - 2 Score 1 0 6 6 0 1 2  Altered sleeping 1 - 0 0 - 1 1  Tired, decreased energy 1 - 3 3 - 1 1  Change in appetite 0 - 0 0 - 1 1  Feeling bad or failure about yourself  1 - 0 0 - 1 2  Trouble concentrating 1 - 0 0 - 1 2  Moving slowly or fidgety/restless 0 - 0 0 - 0 0  Suicidal thoughts 0 - 0 0 - 0 0  PHQ-9 Score 5 - 9 9 - 6 9  Difficult doing work/chores Somewhat difficult - Somewhat difficult Somewhat difficult - - -  Some recent data might be hidden      Review of Systems  Constitutional: Negative.   HENT:  Negative.    Eyes: Negative.   Respiratory: Negative.    Cardiovascular: Negative.   Gastrointestinal: Negative.   Endocrine: Negative.   Genitourinary: Negative.   Musculoskeletal:  Positive for gait problem.       Spasms  Skin: Negative.   Allergic/Immunologic: Negative.   Neurological:  Positive for dizziness.  Hematological: Negative.   Psychiatric/Behavioral:  Positive for dysphoric mood. The patient is nervous/anxious.   All other systems reviewed and are negative.     Objective:   Physical Exam  Gen: no distress, normal appearing HEENT: oral mucosa pink and moist, NCAT Cardio: Reg rate Chest: normal effort, normal rate of breathing Abd: soft, non-distended Ext: bilateral lower extremities swollen and tender Psych: pleasant, normal affect Skin: intact Neuro: Alert and oriented Musculoskeletal: Slow antalgic gait      Assessment & Plan:  1) Lower extremity edema -continue compression garments -ice -elevate -prescribed 7 days Lasix 20mg  -contact me if fails to improve -discuss with PCP  2) Bilateral greater trochanteric bursitis -Discussed current symptoms of pain and history of pain.  -Discussed benefits of exercise in reducing pain. -Bilateral hip Xrs ordered -Discussed following foods that may reduce pain: 1) Ginger (especially studied for arthritis)- reduce leukotriene production to decrease inflammation 2) Blueberries- high in phytonutrients that decrease inflammation 3) Salmon- marine omega-3s reduce joint swelling and pain 4) Pumpkin seeds- reduce inflammation 5) dark chocolate- reduces inflammation 6) turmeric- reduces inflammation 7) tart cherries - reduce pain and stiffness 8) extra virgin olive oil - its compound olecanthal helps to block prostaglandins  9) chili peppers- can be eaten or applied topically via capsaicin 10) mint- helpful for headache, muscle aches, joint pain, and itching 11) garlic- reduces inflammation  Link to further  information on diet for chronic pain: 

## 2020-12-02 NOTE — Patient Instructions (Signed)

## 2020-12-07 ENCOUNTER — Other Ambulatory Visit: Payer: Self-pay

## 2020-12-07 ENCOUNTER — Emergency Department (HOSPITAL_COMMUNITY)
Admission: EM | Admit: 2020-12-07 | Discharge: 2020-12-08 | Disposition: A | Discharge status: Home / Self Care | Payer: Medicaid Other | Attending: Emergency Medicine | Admitting: Emergency Medicine

## 2020-12-07 ENCOUNTER — Emergency Department (HOSPITAL_COMMUNITY): Payer: Medicaid Other

## 2020-12-07 ENCOUNTER — Encounter (HOSPITAL_COMMUNITY): Payer: Self-pay | Admitting: Emergency Medicine

## 2020-12-07 DIAGNOSIS — E785 Hyperlipidemia, unspecified: Secondary | ICD-10-CM | POA: Diagnosis not present

## 2020-12-07 DIAGNOSIS — E039 Hypothyroidism, unspecified: Secondary | ICD-10-CM | POA: Insufficient documentation

## 2020-12-07 DIAGNOSIS — L089 Local infection of the skin and subcutaneous tissue, unspecified: Secondary | ICD-10-CM | POA: Diagnosis not present

## 2020-12-07 DIAGNOSIS — J45909 Unspecified asthma, uncomplicated: Secondary | ICD-10-CM | POA: Diagnosis not present

## 2020-12-07 DIAGNOSIS — M79673 Pain in unspecified foot: Secondary | ICD-10-CM | POA: Diagnosis not present

## 2020-12-07 DIAGNOSIS — E1169 Type 2 diabetes mellitus with other specified complication: Secondary | ICD-10-CM | POA: Diagnosis not present

## 2020-12-07 DIAGNOSIS — L03115 Cellulitis of right lower limb: Secondary | ICD-10-CM | POA: Diagnosis not present

## 2020-12-07 DIAGNOSIS — N9489 Other specified conditions associated with female genital organs and menstrual cycle: Secondary | ICD-10-CM | POA: Insufficient documentation

## 2020-12-07 DIAGNOSIS — M7989 Other specified soft tissue disorders: Secondary | ICD-10-CM | POA: Diagnosis not present

## 2020-12-07 DIAGNOSIS — Z21 Asymptomatic human immunodeficiency virus [HIV] infection status: Secondary | ICD-10-CM | POA: Diagnosis not present

## 2020-12-07 DIAGNOSIS — L03119 Cellulitis of unspecified part of limb: Secondary | ICD-10-CM

## 2020-12-07 LAB — CBC WITH DIFFERENTIAL/PLATELET
Abs Immature Granulocytes: 0.03 10*3/uL (ref 0.00–0.07)
Basophils Absolute: 0.1 10*3/uL (ref 0.0–0.1)
Basophils Relative: 1 %
Eosinophils Absolute: 0.5 10*3/uL (ref 0.0–0.5)
Eosinophils Relative: 4 %
HCT: 38.3 % (ref 36.0–46.0)
Hemoglobin: 11.9 g/dL — ABNORMAL LOW (ref 12.0–15.0)
Immature Granulocytes: 0 %
Lymphocytes Relative: 19 %
Lymphs Abs: 2.1 10*3/uL (ref 0.7–4.0)
MCH: 25.7 pg — ABNORMAL LOW (ref 26.0–34.0)
MCHC: 31.1 g/dL (ref 30.0–36.0)
MCV: 82.7 fL (ref 80.0–100.0)
Monocytes Absolute: 0.9 10*3/uL (ref 0.1–1.0)
Monocytes Relative: 8 %
Neutro Abs: 7.6 10*3/uL (ref 1.7–7.7)
Neutrophils Relative %: 68 %
Platelets: 400 10*3/uL (ref 150–400)
RBC: 4.63 MIL/uL (ref 3.87–5.11)
RDW: 12.8 % (ref 11.5–15.5)
WBC: 11.1 10*3/uL — ABNORMAL HIGH (ref 4.0–10.5)
nRBC: 0 % (ref 0.0–0.2)

## 2020-12-07 LAB — I-STAT BETA HCG BLOOD, ED (MC, WL, AP ONLY): I-stat hCG, quantitative: <5 m[IU]/mL (ref <5)

## 2020-12-07 IMAGING — DX DG FOOT COMPLETE 3+V*R*
3 series · 3 of 3 positions shown · non-contrast
Comparison: None.

CLINICAL DATA: Foot infection

EXAM:
RIGHT FOOT COMPLETE - 3+ VIEW

[foot ap]
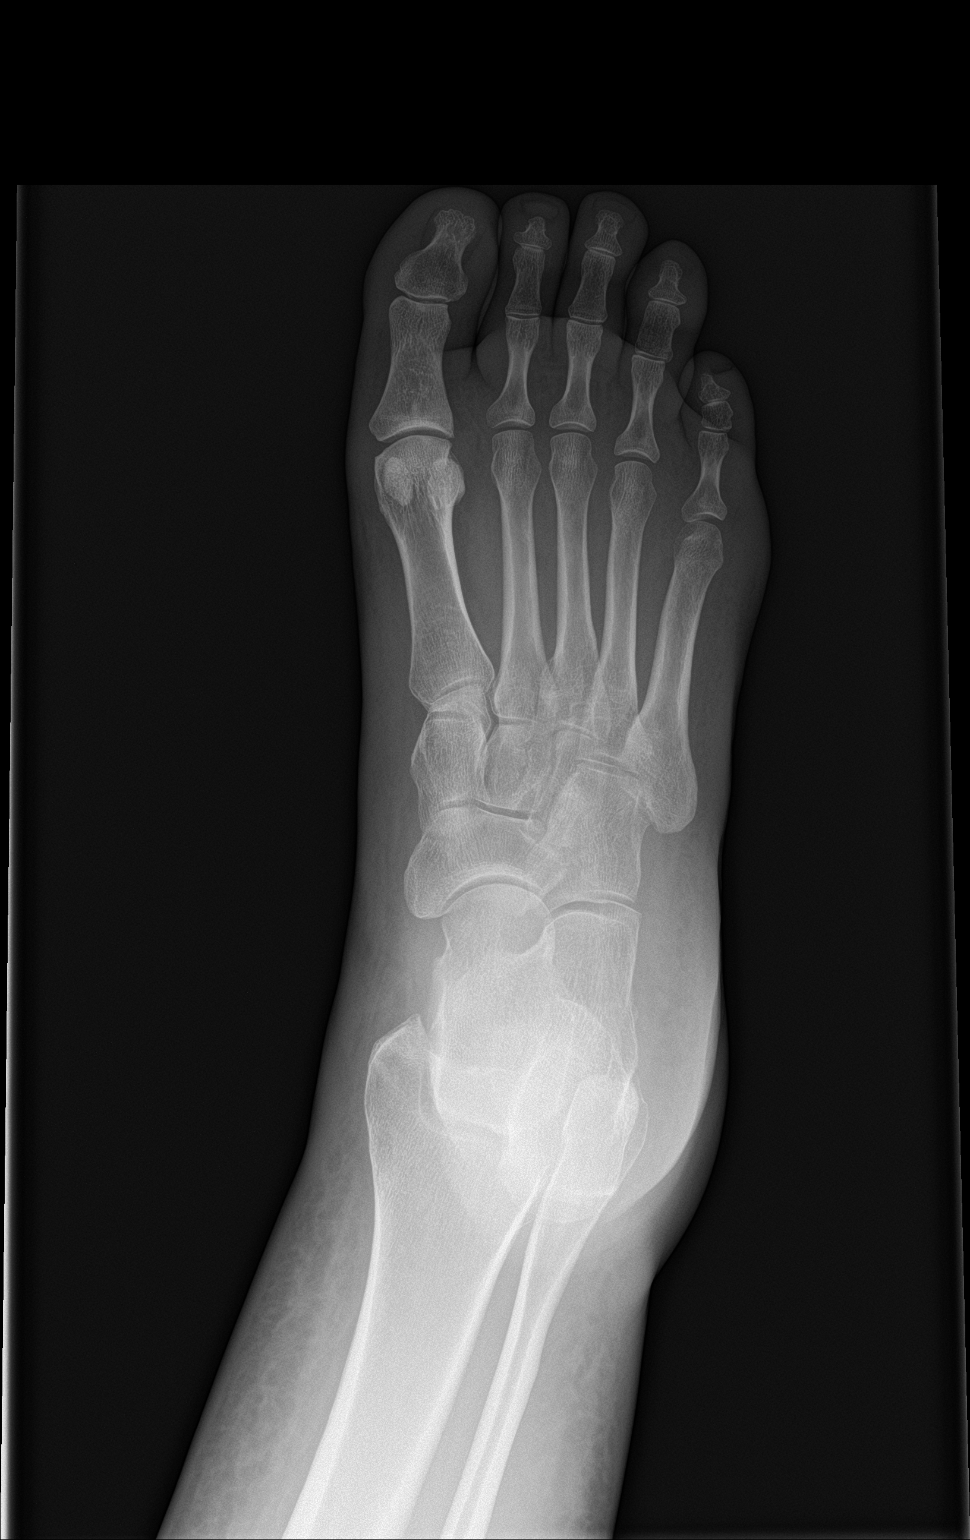

[foot obl]
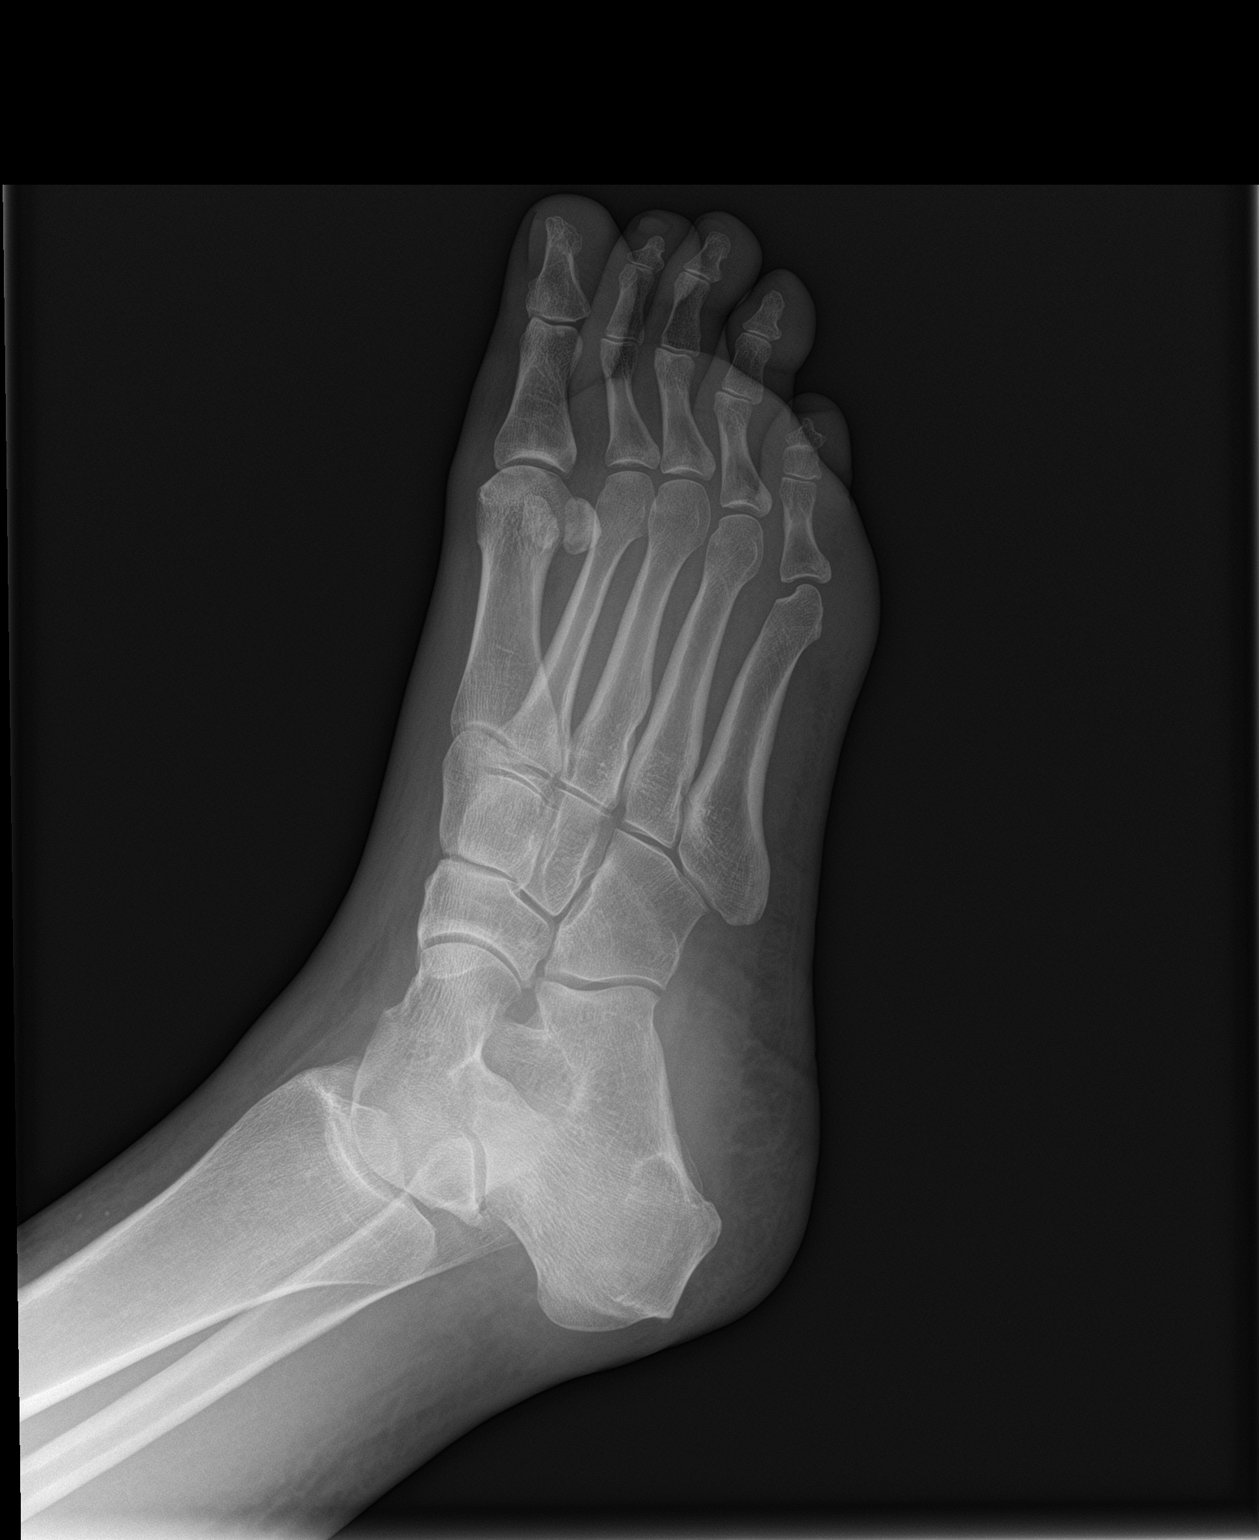

[foot lat]
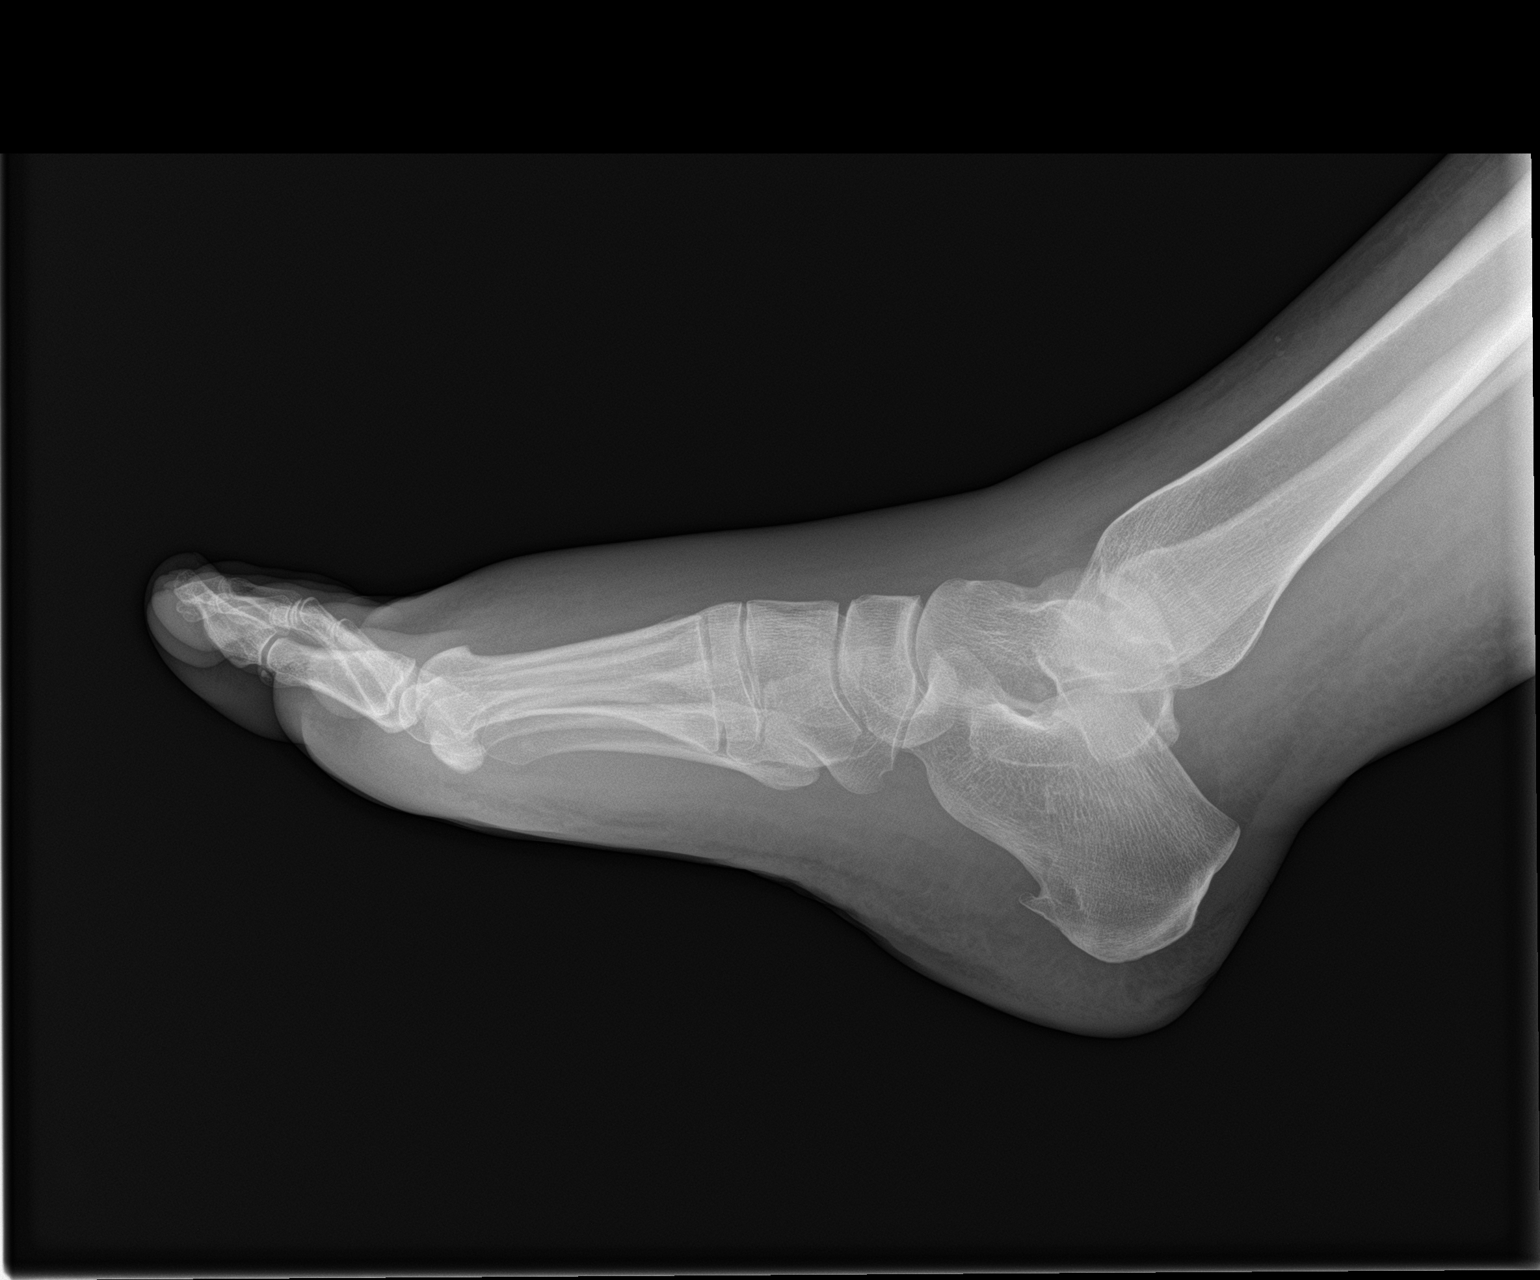

[3 of 3 positions shown; findings below may reference images not displayed]

FINDINGS: No fracture or malalignment. Diffuse soft tissue swelling without
soft tissue emphysema. Small plantar calcaneal spur. No periostitis
or erosive change.
IMPRESSION: No acute osseous abnormality

## 2020-12-07 MED ORDER — OXYCODONE-ACETAMINOPHEN 5-325 MG PO TABS
1.0000 | ORAL_TABLET | Freq: Once | ORAL | Status: AC
  Administered 2020-12-07: 1 via ORAL
  Filled 2020-12-07: qty 1

## 2020-12-07 NOTE — ED Triage Notes (Signed)
Patient reports worsening redness/swelling of right foot for several weeks despite oral antibiotics , denies fever or chills .

## 2020-12-07 NOTE — ED Provider Notes (Signed)
Emergency Medicine Provider Triage Evaluation Note  Kristin Hart , a 40 y.o. female  was evaluated in triage.  Patient with hx of HIV (last CD4 count WNL) & DM presents with complaints of worsening right foot pain/redness/swelling. Seen in the ED  11/24/20 for bilateral foot pain/redness, started on abx (augmentin & doxycycline), states she is taking these as prescribed ( has not completed them ) but has had overall improvement in the LLE and worsening pain/swelling/redness in the LLE. Worse with palpation. Was bit on the right foot by a rat a little over 1 month ago.   Review of Systems  Positive: Foot pain/swelling/redness Negative: Fever, vomiting.   Physical Exam  BP (!) 111/93 (BP Location: Left Arm)   Pulse (!) 119   Temp 98.8 F (37.1 C) (Oral)   Resp (!) 22   SpO2 100%  Gen:   Awake, tearful.  Resp:  Normal effort  MSK:   RLE with edema extending to the ankle, dorsolateral right foot is erythematous. Tender to palpation throughout the right foot. 2+ symmetric DP pulses. Able to plantar/dorsiflex against resistance.   Medical Decision Making  Medically screening exam initiated at 10:41 PM.  Appropriate orders placed.  MALCOLM HETZ was informed that the remainder of the evaluation will be completed by another provider, this initial triage assessment does not replace that evaluation, and the importance of remaining in the ED until their evaluation is complete.  Afebrile, tachycardic but tearful and uncomfortable on initial evaluation, will give analgesics, & check basic labs with R foot x-ray.    Cherly Anderson, PA-C 12/07/20 2250    Charlynne Pander, MD 12/08/20 1504

## 2020-12-08 ENCOUNTER — Other Ambulatory Visit (HOSPITAL_COMMUNITY): Payer: Self-pay

## 2020-12-08 ENCOUNTER — Emergency Department (HOSPITAL_COMMUNITY): Payer: Medicaid Other

## 2020-12-08 DIAGNOSIS — M7989 Other specified soft tissue disorders: Secondary | ICD-10-CM | POA: Diagnosis not present

## 2020-12-08 DIAGNOSIS — R609 Edema, unspecified: Secondary | ICD-10-CM

## 2020-12-08 LAB — COMPREHENSIVE METABOLIC PANEL
ALT: 20 U/L (ref 0–44)
AST: 22 U/L (ref 15–41)
Albumin: 3.8 g/dL (ref 3.5–5.0)
Alkaline Phosphatase: 76 U/L (ref 38–126)
Anion gap: 9 (ref 5–15)
BUN: 14 mg/dL (ref 6–20)
CO2: 24 mmol/L (ref 22–32)
Calcium: 9.4 mg/dL (ref 8.9–10.3)
Chloride: 105 mmol/L (ref 98–111)
Creatinine, Ser: 0.94 mg/dL (ref 0.44–1.00)
GFR, Estimated: >60 mL/min (ref >60)
Glucose, Bld: 148 mg/dL — ABNORMAL HIGH (ref 70–99)
Potassium: 3.6 mmol/L (ref 3.5–5.1)
Sodium: 138 mmol/L (ref 135–145)
Total Bilirubin: 0.6 mg/dL (ref 0.3–1.2)
Total Protein: 7.3 g/dL (ref 6.5–8.1)

## 2020-12-08 MED ORDER — DEXTROSE 5 % IV SOLN
1500.0000 mg | Freq: Once | INTRAVENOUS | Status: AC
  Administered 2020-12-08: 1500 mg via INTRAVENOUS
  Filled 2020-12-08: qty 75

## 2020-12-08 NOTE — Progress Notes (Signed)
Pharmacy Note:  Dalbavancin for Acute Bacterial Skin and Skin Structure Infection (ABSSSI) Patients to [facility] Discharge Kristin Hart is an 40 y.o. female who presented to [facility] on 12/07/2020 with an Acute Bacterial Skin and Skin Structure Infection. Patient to continue taking Augmentin outpatient, and will discontinue Doxycycline after speaking to ID [facility]. ED team in agreement.  Inclusion criteria - Indication []  Moderately large skin lesion (>=75 cm2 or larger - about the size of a baseball) [x]  Cellulitis  Inclusion Criteria - at least one SIRS criteria present []  WBC > 12,000 or < 4000 []  temp >100.9 or < 96.8 [x]  heart rate >90[]  respiratory rate >20  Patient was evaluated for the following exclusion criteria and no exclusions were found  Hardware involvement, Hypotension / shock, Elevated lactate (>2) without other explanation, gram-negative infection risk factors (bites, water exposure, infection after trauma, infection after skin graft, neutropenia, burns, severe immunocompromise), necrotizing fasciitis possible or confirmed, Known or suspected osteomyelitis or septic arthritis, endocarditis, diabetic foot infection, ischemic ulcers, post-operative wound infection, perirectal infections, need for drainage in the operating room, hand or facial infections, injection drug users with a fever, bacteremia, pregnancy or breastfeeding, allergy to related antibiotics like vancomycin, known liver disease (t.bili >2x ULN or AST/ALT 3x ULN)  , PharmD, [facility] Emergency Medicine Clinical Pharmacist ED RPh Phone: [phone] Main RX: [phone]

## 2020-12-08 NOTE — Discharge Instructions (Addendum)
Continue Augmentin, STOP the Doxycycline.  The follow up clinic will contact you to discuss recheck. Return to the ER for new or worsening symptoms.

## 2020-12-08 NOTE — ED Notes (Signed)
Reviewed discharge instructions with patient. Follow-up care and medications reviewed. Patient  verbalized understanding. Patient A&Ox4, VSS, and ambulatory with steady gait upon discharge.  °

## 2020-12-08 NOTE — ED Provider Notes (Signed)
[facility] EMERGENCY DEPARTMENT Provider Note   CSN: [id number] Arrival date & time: 12/07/20  2231     History Chief Complaint  Patient presents with   Cellulitis - Right Foot    Kristin Hart is a 40 y.o. female.  40 year old female with history of diabetes, HIV (reports compliance with her Biktarvy, reports last CD4 count normal), IV drug abuse presents with ongoing pain and swelling with redness in her right worse than left lower extremities.  Patient states her symptoms started about a month and a half ago when she saw a rat bite her legs.  Patient cleaned the bites at the time and did not think anything of it.  Patient came to the emergency room 2 weeks ago with redness and swelling of her legs concerning for erysipelas and was started on Augmentin and doxycycline.  It took patient a few days to get the antibiotics and she is still taking them at this time.  Reports pain and swelling mostly to the lateral left ankle, right foot and lower leg with diffuse swelling and tenderness.  Denies fevers.  No other complaints or concerns.      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   Screening for cervical cancer 04/21/2020   Screening for STDs (sexually transmitted diseases) 04/21/2020   Costochondritis 02/19/2020   Bilateral hip pain 02/19/2020   Elbow pain, right 12/04/2019   Housing problems  11/23/2019   Mental health disorder 10/12/2019   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   Paresthesias 10/12/2019   Healthcare maintenance 08/05/2019   HIV (human immunodeficiency virus infection) (HCC) 12/31/2018   Anxiety 05/29/2017   Self-mutilation 05/29/2017   Depression 06/07/2016   Marijuana use 06/07/2016   Low vitamin D level 03/15/2016   Prediabetes 02/22/2016   Asthma 02/22/2016   ADD (attention deficit disorder) 02/22/2016   Methamphetamine use disorder, severe, 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/22/2016   Hyperlipidemia with target LDL less than 70 01/31/2012   Migraine 01/31/2012   Tremor, 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    Past Surgical History:  Procedure Laterality Date   CESAREAN SECTION N/A 07/04/2016   Procedure: CESAREAN SECTION;  Surgeon: [name], DO;  Location: [facility] BIRTHING SUITES;  Service: Obstetrics;  Laterality: N/A;   ENTEROSCOPY N/A 11/08/2017   Procedure: ENTEROSCOPY;  Surgeon: Hilarie Fredrickson, MD;  Location: [facility] ENDOSCOPY;  Service: Endoscopy;  Laterality: N/A;   NO PAST SURGERIES       OB History     Gravida  1   Para  1   Term  1   Preterm      AB      Living         SAB      IAB      Ectopic      Multiple  0   Live Births  No family history on file.  Social History   Tobacco Use   Smoking status: Never   Smokeless tobacco: Never  Vaping Use   Vaping Use: Never used  Substance Use Topics   Alcohol use: No   Drug use: Yes    Types: Methamphetamines, Marijuana    Comment: Couple of times per month    Home Medications Prior to Admission medications   Medication Sig Start Date End Date Taking? Authorizing Provider  albuterol (PROAIR HFA) 108 (90 Base) MCG/ACT inhaler Inhale 1 puff into the lungs every 4 (four) hours as needed. Patient taking differently: Inhale 1 puff into the lungs every 4 (four) hours as needed for wheezing or shortness of breath. 05/13/20   Lilland, Alana, DO  bictegravir-emtricitabine-tenofovir AF  (BIKTARVY) 50-200-25 MG TABS tablet Take 1 tablet by mouth daily. 07/29/20   Veryl Speak, FNP  cetirizine (ZYRTEC) 10 MG tablet Take 1 tablet (10 mg total) by mouth daily. 05/13/20   Lilland, Alana, DO  cyclobenzaprine (FLEXERIL) 5 MG tablet Take 1 tablet (5 mg total) by mouth at bedtime. 07/13/20   Dollene Cleveland, DO  diclofenac Sodium (VOLTAREN) 1 % GEL Apply 2 g topically 4 (four) times daily as needed (for elbow pain). As needed for hip pain. 04/13/20   Lilland, Alana, DO  doxycycline (VIBRAMYCIN) 100 MG capsule Take 1 capsule (100 mg total) by mouth 2 (two) times daily. 11/24/20   Redwine, Madison A, PA-C  furosemide (LASIX) 20 MG tablet Take 1 tablet (20 mg total) by mouth daily. 12/02/20   Raulkar, Drema Pry, MD  gabapentin (NEURONTIN) 100 MG capsule TAKE 1 CAPSULE BY MOUTH TWICE A DAY 09/27/20   Lilland, Alana, DO  hydrocortisone cream 1 % Apply to affected area 2 times daily Patient taking differently: Apply 1 application topically 2 (two) times daily as needed for itching. 08/17/18   Henderly, Britni A, PA-C  ibuprofen (ADVIL) 600 MG tablet Take 1 tablet (600 mg total) by mouth every 6 (six) hours as needed. Patient taking differently: Take 600 mg by mouth every 6 (six) hours as needed for moderate pain. 10/22/20   Elpidio Anis, PA-C  Menthol, Topical Analgesic, (ICY HOT EX) Apply 1 application topically 2 (two) times daily as needed (pain).    [provider]  naproxen (NAPROSYN) 500 MG tablet TAKE 1 TABLET BY MOUTH 2 TIMES DAILY WITH A MEAL. Patient taking differently: Take 500 mg by mouth daily as needed for moderate pain. 09/27/20   Lilland, Alana, DO  omeprazole (PRILOSEC) 40 MG capsule TAKE 1 CAPSULE BY MOUTH EVERY DAY Patient taking differently: Take 40 mg by mouth daily. 07/15/20   Veryl Speak, FNP  topiramate (TOPAMAX) 25 MG tablet Take 1 tablet (25 mg total) by mouth daily. 07/13/20   Dollene Cleveland, DO    Allergies    Patient has no known allergies.  Review of  Systems   Review of Systems  Constitutional:  Negative for fever.  Respiratory:  Negative for shortness of breath.   Cardiovascular:  Negative for chest pain.  Gastrointestinal:  Negative for abdominal pain.  Musculoskeletal:  Positive for gait problem, joint swelling and myalgias.  Skin:  Positive for color change. Negative for wound.  Allergic/Immunologic: Positive for immunocompromised state.  Neurological:  Negative for weakness and numbness.  Hematological:  Does not bruise/bleed easily.  All other systems reviewed and are negative.  Physical Exam Updated Vital Signs BP (!) 153/80   Pulse 72   Temp 98 F (  36.7 C) (Oral)   Resp 16   SpO2 100%   Physical Exam Vitals and nursing note reviewed.  Constitutional:      General: She is not in acute distress.    Appearance: She is well-developed. She is not diaphoretic.  HENT:     Head: Normocephalic and atraumatic.  Cardiovascular:     Pulses: Normal pulses.  Pulmonary:     Effort: Pulmonary effort is normal.  Musculoskeletal:        General: Swelling and tenderness present.  Skin:    General: Skin is warm and dry.     Findings: Erythema present.     Comments: Erythema with swelling and mild tenderness to lateral left ankle.  Erythema with swelling diffuse right foot and ankle, extends to distal lower leg, no streaking.  DP pulses present, sensation intact.  Skin intact.  Neurological:     Mental Status: She is alert and oriented to person, place, and time.     Sensory: No sensory deficit.     Motor: No weakness.  Psychiatric:        Behavior: Behavior normal.       ED Results / Procedures / Treatments   Labs (all labs ordered are listed, but only abnormal results are displayed) Labs Reviewed  CBC WITH DIFFERENTIAL/PLATELET - Abnormal; Notable for the following components:      Result Value   WBC 11.1 (*)    Hemoglobin 11.9 (*)    MCH 25.7 (*)    All other components within normal limits  COMPREHENSIVE  METABOLIC PANEL - Abnormal; Notable for the following components:   Glucose, Bld 148 (*)    All other components within normal limits  I-STAT BETA HCG BLOOD, ED ([facility], [facility], [facility] ONLY)    EKG None  Radiology DG Foot Complete Right  Result Date: 12/07/2020 CLINICAL DATA:  Foot infection EXAM: RIGHT FOOT COMPLETE - 3+ VIEW COMPARISON:  None. FINDINGS: No fracture or malalignment. Diffuse soft tissue swelling without soft tissue emphysema. Small plantar calcaneal spur. No periostitis or erosive change. IMPRESSION: No acute osseous abnormality Electronically Signed   By: Jasmine Pang M.D.   On: 12/07/2020 23:30   VAS [place] LOWER EXTREMITY VENOUS (DVT) (7a-7p)  Result Date: 12/08/2020  Lower Venous DVT Study Patient Name:  TIEISHA DARDEN  Date of Exam:   12/08/2020 Medical Rec #: [id number]        Accession #:    [id number] Date of Birth: 26-Jun-1980       Patient Gender: F Patient Age:   3 years Exam Location:  [facility] Procedure:      VAS [place] LOWER EXTREMITY VENOUS (DVT) Referring Phys: Army Melia --------------------------------------------------------------------------------  Indications: Swelling, and Edema.  Comparison Study: no prior Performing Technologist: [place] Ponder RVS  Examination Guidelines: A complete evaluation includes B-mode imaging, spectral Doppler, color Doppler, and power Doppler as needed of all accessible portions of each vessel. Bilateral testing is considered an integral part of a complete examination. Limited examinations for reoccurring indications may be performed as noted. The reflux portion of the exam is performed with the patient in reverse Trendelenburg.  +---------+---------------+---------+-----------+----------+--------------+ RIGHT    CompressibilityPhasicitySpontaneityPropertiesThrombus Aging +---------+---------------+---------+-----------+----------+--------------+ CFV      Full           Yes      Yes                                  +---------+---------------+---------+-----------+----------+--------------+  SFJ      Full                                                        +---------+---------------+---------+-----------+----------+--------------+ FV Prox  Full                                                        +---------+---------------+---------+-----------+----------+--------------+ FV Mid   Full                                                        +---------+---------------+---------+-----------+----------+--------------+ FV DistalFull                                                        +---------+---------------+---------+-----------+----------+--------------+ PFV      Full                                                        +---------+---------------+---------+-----------+----------+--------------+ POP      Full           Yes      Yes                                 +---------+---------------+---------+-----------+----------+--------------+ PTV      Full                                                        +---------+---------------+---------+-----------+----------+--------------+ PERO     Full                                                        +---------+---------------+---------+-----------+----------+--------------+   +----+---------------+---------+-----------+----------+--------------+ LEFTCompressibilityPhasicitySpontaneityPropertiesThrombus Aging +----+---------------+---------+-----------+----------+--------------+ CFV Full           Yes      Yes                                 +----+---------------+---------+-----------+----------+--------------+     Summary: RIGHT: - There is no evidence of deep vein thrombosis in the lower extremity.  - No cystic structure found in the popliteal fossa.  LEFT: - No evidence of common femoral vein obstruction.  *See table(s) above for measurements and observations. Electronically signed by Gerarda Fraction on  12/08/2020  at 2:59:19 PM.    Final     Procedures Procedures   Medications Ordered in ED Medications  oxyCODONE-acetaminophen (PERCOCET/ROXICET) 5-325 MG per tablet 1 tablet (1 tablet Oral Given 12/07/20 2250)  dalbavancin (DALVANCE) 1,500 mg in dextrose 5 % 500 mL IVPB (0 mg Intravenous Stopped 12/08/20 1528)    ED Course  I have reviewed the triage vital signs and the nursing notes.  Pertinent labs & imaging results that were available during my care of the patient were reviewed by me and considered in my medical decision making (see chart for details).  Clinical Course as of 12/08/20 1537  Thu Dec 08, 2020  7143 40 year old female with complaint of ongoing leg infection, right worse than left as above.  On exam is found to have erythema swelling and tenderness to the right foot and distal right lower leg.  DP pulse present, sensation intact.  No streaking.  Due to swelling in the leg, ultrasound was ordered to evaluate for DVT and is negative for acute DVT. Discussed with Dr. Stevie Kern, ER attending who has seen the patient.  Plan is for IV Dalvance and discharged with follow-up with ID clinic.  Patient is agreeable with plan, will give crutches to weight-bear as tolerated.  Pharmacy has recommended discontinuing doxycycline, continue with Augmentin. [LM]  1536 Given return to ER precautions.  Verbalizes understanding.  Review of labs, CBC with mild leukocytosis with white count of 11.1.  CMP without significant electrolyte arrangement.  hCG negative. [LM]    Clinical Course User Index [LM] Alden Hipp   MDM Rules/Calculators/A&P                           Final Clinical Impression(s) / ED Diagnoses Final diagnoses:  Cellulitis of foot    Rx / DC Orders ED Discharge Orders     None        Jeannie Fend, PA-C 12/08/20 1537    Milagros Loll, MD 12/09/20 [phone]

## 2020-12-08 NOTE — ED Notes (Signed)
Called ortho for crutches  

## 2020-12-08 NOTE — Progress Notes (Signed)
Lower extremity venous has been completed.   Preliminary results in CV Proc.   Kristin Hart Kristin Hart 12/08/2020 1:05 PM

## 2020-12-09 ENCOUNTER — Other Ambulatory Visit (HOSPITAL_COMMUNITY): Payer: Self-pay

## 2020-12-16 ENCOUNTER — Other Ambulatory Visit: Payer: Self-pay | Admitting: Family Medicine

## 2020-12-16 DIAGNOSIS — G43009 Migraine without aura, not intractable, without status migrainosus: Secondary | ICD-10-CM

## 2020-12-20 ENCOUNTER — Other Ambulatory Visit (HOSPITAL_COMMUNITY)
Admission: RE | Admit: 2020-12-20 | Discharge: 2020-12-20 | Disposition: A | Discharge status: Home / Self Care | Payer: Medicaid Other | Source: Ambulatory Visit | Attending: Family | Admitting: Family

## 2020-12-20 ENCOUNTER — Ambulatory Visit (INDEPENDENT_AMBULATORY_CARE_PROVIDER_SITE_OTHER): Payer: Medicaid Other | Admitting: Family

## 2020-12-20 ENCOUNTER — Encounter: Payer: Self-pay | Admitting: Family

## 2020-12-20 ENCOUNTER — Ambulatory Visit: Payer: Medicaid Other

## 2020-12-20 ENCOUNTER — Other Ambulatory Visit: Payer: Self-pay

## 2020-12-20 VITALS — BP 161/95 | HR 88 | Wt 164.0 lb

## 2020-12-20 DIAGNOSIS — Z113 Encounter for screening for infections with a predominantly sexual mode of transmission: Secondary | ICD-10-CM

## 2020-12-20 DIAGNOSIS — Z0000 Encounter for general adult medical examination without abnormal findings: Secondary | ICD-10-CM | POA: Diagnosis not present

## 2020-12-20 DIAGNOSIS — Z21 Asymptomatic human immunodeficiency virus [HIV] infection status: Secondary | ICD-10-CM

## 2020-12-20 DIAGNOSIS — Z3009 Encounter for other general counseling and advice on contraception: Secondary | ICD-10-CM

## 2020-12-20 DIAGNOSIS — Z3202 Encounter for pregnancy test, result negative: Secondary | ICD-10-CM

## 2020-12-20 DIAGNOSIS — S61209A Unspecified open wound of unspecified finger without damage to nail, initial encounter: Secondary | ICD-10-CM | POA: Insufficient documentation

## 2020-12-20 LAB — POCT URINE PREGNANCY: Preg Test, Ur: NEGATIVE

## 2020-12-20 MED ORDER — MEDROXYPROGESTERONE ACETATE 150 MG/ML IM SUSP
150.0000 mg | Freq: Once | INTRAMUSCULAR | Status: AC
Start: 2020-12-20 — End: 2020-12-20
  Administered 2020-12-20: 150 mg via INTRAMUSCULAR

## 2020-12-20 MED ORDER — BICTEGRAVIR-EMTRICITAB-TENOFOV 50-200-25 MG PO TABS: 1.0000 | ORAL_TABLET | Freq: Every day | ORAL | 5 refills | Status: DC

## 2020-12-20 NOTE — Assessment & Plan Note (Signed)
   Discussed importance of safe sexual practice and condom usage.  Condoms provided.  Declines influenza vaccine today.

## 2020-12-20 NOTE — Assessment & Plan Note (Signed)
Kristin Hart has an open wound on her left index finger from glass. Wound examined and no evidence of glass present. Continue with basic wound care with soap and water. Monitor for signs of infection.

## 2020-12-20 NOTE — Progress Notes (Signed)
Brief Narrative   Patient ID: Kristin Hart, female    DOB: [birth year], 40 y.o.   MRN: [id number]  Ms. Appenzeller is a 40 y/o caucasian female diagnosed with HIV disease on 01/02/19 with risk factor of heterosexual contact and IV drug use. Initial viral load on 06/23/19 23,300 and CD4 count 577. Genotype with no significant drug resistance. Entered care at [facility] Stage 1. [id number] negative. No history of opportunistic infection. Sole medication regimen of Biktarvy.   Subjective:    Chief Complaint  Patient presents with   Follow-up    HPI:  Kristin Hart is a 40 y.o. female with HIV disease last seen on 08/29/20 with well controlled virus and good adherence and tolerance to her ART regiment of Biktarvy. Last blood work on 04/14/20 with undetectable viral load and CD4 count of 1,113.  In the interim has been in the emergency department for a rat bite/cellulitis of the foot.  Here today for routine follow-up.  Ms. Stamp continues to take her Biktarvy daily as prescribed with no adverse side effects.  Overall feeling well today with concern for a wound on her right index finger sustained when she cut herself on glass last evening.  She applied direct pressure and a bandage. Denies fevers, chills, night sweats, headaches, changes in vision, neck pain/stiffness, nausea, diarrhea, vomiting, lesions or rashes.  Ms. Marten has no problems obtaining medication from the pharmacy remains covered through [facility]  Denies feelings of being down, depressed, or hopeless.  Continues to live in the same house she did previously but does not have electricity.  Continue to work with counseling.  Using marijuana and methamphetamines.  No current tobacco use or alcohol consumption.  Condoms offered and provided.  Requesting Depo-Provera.   No Known Allergies    Outpatient Medications Prior to Visit  Medication Sig Dispense Refill   albuterol (PROAIR HFA) 108 (90 Base) MCG/ACT inhaler Inhale 1 puff into the  lungs every 4 (four) hours as needed. (Patient taking differently: Inhale 1 puff into the lungs every 4 (four) hours as needed for wheezing or shortness of breath.) 18 g 2   cetirizine (ZYRTEC) 10 MG tablet Take 1 tablet (10 mg total) by mouth daily. 30 tablet 11   diclofenac Sodium (VOLTAREN) 1 % GEL Apply 2 g topically 4 (four) times daily as needed (for elbow pain). As needed for hip pain. 2 g 3   gabapentin (NEURONTIN) 100 MG capsule TAKE 1 CAPSULE BY MOUTH TWICE A DAY 30 capsule 0   hydrocortisone cream 1 % Apply to affected area 2 times daily (Patient taking differently: Apply 1 application topically 2 (two) times daily as needed for itching.) 15 g 0   ibuprofen (ADVIL) 600 MG tablet Take 1 tablet (600 mg total) by mouth every 6 (six) hours as needed. (Patient taking differently: Take 600 mg by mouth every 6 (six) hours as needed for moderate pain.) 30 tablet 0   Menthol, Topical Analgesic, (ICY HOT EX) Apply 1 application topically 2 (two) times daily as needed (pain).     naproxen (NAPROSYN) 500 MG tablet TAKE 1 TABLET BY MOUTH 2 TIMES DAILY WITH A MEAL. (Patient taking differently: Take 500 mg by mouth daily as needed for moderate pain.) 14 tablet 0   omeprazole (PRILOSEC) 40 MG capsule TAKE 1 CAPSULE BY MOUTH EVERY DAY (Patient taking differently: Take 40 mg by mouth daily.) 30 capsule 1   topiramate (TOPAMAX) 25 MG tablet TAKE 1 TABLET (25 MG TOTAL) BY  MOUTH DAILY. 30 tablet 1   bictegravir-emtricitabine-tenofovir AF (BIKTARVY) 50-200-25 MG TABS tablet Take 1 tablet by mouth daily. 30 tablet 3   cyclobenzaprine (FLEXERIL) 5 MG tablet Take 1 tablet (5 mg total) by mouth at bedtime. (Patient not taking: Reported on 12/20/2020) 10 tablet 0   doxycycline (VIBRAMYCIN) 100 MG capsule Take 1 capsule (100 mg total) by mouth 2 (two) times daily. (Patient not taking: Reported on 12/20/2020) 14 capsule 0   furosemide (LASIX) 20 MG tablet Take 1 tablet (20 mg total) by mouth daily. (Patient not taking:  Reported on 12/20/2020) 7 tablet 0   No facility-administered medications prior to visit.     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, per pt   Diabetes mellitus without complication (HCC)    type 2   Foreign body in small intestine    HIV (human immunodeficiency virus infection) (HCC)    Hypothyroidism    Stomach ulcer      Past Surgical History:  Procedure Laterality Date   CESAREAN SECTION N/A 07/04/2016   Procedure: CESAREAN SECTION;  Surgeon: [name], DO;  Location: [facility] BIRTHING SUITES;  Service: Obstetrics;  Laterality: N/A;   ENTEROSCOPY N/A 11/08/2017   Procedure: ENTEROSCOPY;  Surgeon: Hilarie Fredrickson, MD;  Location: [facility] ENDOSCOPY;  Service: Endoscopy;  Laterality: N/A;   NO PAST SURGERIES        Review of Systems  Constitutional:  Negative for appetite change, chills, diaphoresis, fatigue, fever and unexpected weight change.  Eyes:        Negative for acute change in vision  Respiratory:  Negative for chest tightness, shortness of breath and wheezing.   Cardiovascular:  Negative for chest pain.  Gastrointestinal:  Negative for diarrhea, nausea and vomiting.  Genitourinary:  Negative for dysuria, pelvic pain and vaginal discharge.  Musculoskeletal:  Negative for neck pain and neck stiffness.  Skin:  Positive for wound. Negative for rash.  Neurological:  Negative for seizures, syncope, weakness and headaches.  Hematological:  Negative for adenopathy. Does not bruise/bleed easily.  Psychiatric/Behavioral:  Negative for hallucinations.      Objective:    BP (!) 161/95   Pulse 88   Wt 164 lb (74.4 kg)   SpO2 99%   BMI 25.12 kg/m  Nursing note and vital signs reviewed.  Physical Exam Constitutional:      General: She is not in acute distress.    Appearance: She is well-developed.  Eyes:     Conjunctiva/sclera: Conjunctivae normal.  Cardiovascular:     Rate and Rhythm:  Normal rate and regular rhythm.     Heart sounds: Normal heart sounds. No murmur heard.   No friction rub. No gallop.  Pulmonary:     Effort: Pulmonary effort is normal. No respiratory distress.     Breath sounds: Normal breath sounds. No wheezing or rales.  Chest:     Chest wall: No tenderness.  Abdominal:     General: Bowel sounds are normal.     Palpations: Abdomen is soft.     Tenderness: There is no abdominal tenderness.  Musculoskeletal:     Cervical back: Neck supple.  Lymphadenopathy:     Cervical: No cervical adenopathy.  Skin:    General: Skin is warm and dry.     Findings: No rash.     Comments: Wound on right index finger. No evidence of foreign objects or debris. No current bleeding.  Neurological:     Mental Status: She is alert and oriented to person, place, and time.  Psychiatric:        Mood and Affect: Mood normal.     Depression screen [facility] 2/9 12/02/2020 07/29/2020 07/13/2020 07/13/2020 06/08/2020  Decreased Interest 0 0 3 3 0  Down, Depressed, Hopeless 1 0 3 3 0  PHQ - 2 Score 1 0 6 6 0  Altered sleeping 1 - 0 0 -  Tired, decreased energy 1 - 3 3 -  Change in appetite 0 - 0 0 -  Feeling bad or failure about yourself  1 - 0 0 -  Trouble concentrating 1 - 0 0 -  Moving slowly or fidgety/restless 0 - 0 0 -  Suicidal thoughts 0 - 0 0 -  PHQ-9 Score 5 - 9 9 -  Difficult doing work/chores Somewhat difficult - Somewhat difficult Somewhat difficult -  Some recent data might be hidden       Assessment & Plan:    Patient Active Problem List   Diagnosis Date Noted   Birth control counseling 12/20/2020   Open wnd of finger 12/20/2020   Elevated BP without diagnosis of hypertension 09/21/2020   Grief 08/29/2020   Encounter for initial prescription of injectable contraceptive 07/29/2020   Left hip pain 07/17/2020   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   Screening for cervical cancer 04/21/2020   Screening for STDs (sexually transmitted  diseases) 04/21/2020   Costochondritis 02/19/2020   Bilateral hip pain 02/19/2020   Elbow pain, right 12/04/2019   Housing problems 11/23/2019   Mental health disorder 10/12/2019   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   Paresthesias 10/12/2019   Healthcare maintenance 08/05/2019   HIV (human immunodeficiency virus infection) (HCC) 12/31/2018   Anxiety 05/29/2017   Self-mutilation 05/29/2017   Depression 06/07/2016   Marijuana use 06/07/2016   Low vitamin D level 03/15/2016   Prediabetes 02/22/2016   Asthma 02/22/2016   ADD (attention deficit disorder) 02/22/2016   Methamphetamine use disorder, severe, 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/22/2016   Hyperlipidemia with target LDL less than 70 01/31/2012   Migraine 01/31/2012   Tremor, 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     Problem List Items Addressed This Visit       Other   HIV (human immunodeficiency virus infection) (HCC) (Chronic)    Ms. Rini continues to have well-controlled virus with good adherence and tolerance to her ART regimen of Biktarvy.  No signs/symptoms of opportunistic infection.  Reviewed previous lab work and discussed plan of care.  Continue current dose of Biktarvy.  Check blood work today.  Plan for follow-up in 1 month or sooner if needed with lab work on the same day.      Relevant Medications   bictegravir-emtricitabine-tenofovir AF (BIKTARVY) 50-200-25 MG TABS tablet   Other Relevant Orders   COMPLETE METABOLIC PANEL WITH GFR   T-helper cell (CD4)- ([facility] clinic only)   HIV-1 RNA quant-no reflex-bld   Healthcare maintenance    Discussed importance of safe sexual practice and condom usage.  Condoms provided. Declines influenza vaccine today.      Screening for STDs (sexually transmitted diseases)   Relevant Orders   Urine cytology ancillary only([place])   Cytology (oral, anal, urethral) ancillary only   Birth control counseling - Primary    Discussed birth control including risks and benefits.  She wishes to proceed  with Depo-Provera.  Point-of-care pregnancy test negative.  Depo-Provera injection provided and will need next injection in  3 months.      Relevant Orders   POCT urine pregnancy (Completed)   Open wnd of finger    Ms. Shiller has an open wound on her left index finger from glass. Wound examined and no evidence of glass present. Continue with basic wound care with soap and water. Monitor for signs of infection.         I am having Alvan Dame. Prats maintain her hydrocortisone cream, diclofenac Sodium, albuterol, cetirizine, cyclobenzaprine, omeprazole, (Menthol, Topical Analgesic, (ICY HOT EX)), gabapentin, naproxen, ibuprofen, doxycycline, furosemide, topiramate, and bictegravir-emtricitabine-tenofovir AF. We administered medroxyPROGESTERone.   Meds ordered this encounter  Medications   bictegravir-emtricitabine-tenofovir AF (BIKTARVY) 50-200-25 MG TABS tablet    Sig: Take 1 tablet by mouth daily.    Dispense:  30 tablet    Refill:  5    Order Specific Question:   Supervising Provider    Answer:   Judyann Munson [[id number]]   medroxyPROGESTERone (DEPO-PROVERA) injection 150 mg     Follow-up: Return in about 1 month (around 01/20/2021).   Marcos Eke, MSN, FNP-C Nurse Practitioner [facility] Main number: [phone]

## 2020-12-20 NOTE — Assessment & Plan Note (Signed)
Ms. Mohar continues to have well-controlled virus with good adherence and tolerance to her ART regimen of Biktarvy.  No signs/symptoms of opportunistic infection.  Reviewed previous lab work and discussed plan of care.  Continue current dose of Biktarvy.  Check blood work today.  Plan for follow-up in 1 month or sooner if needed with lab work on the same day.

## 2020-12-20 NOTE — Assessment & Plan Note (Signed)
Discussed birth control including risks and benefits.  She wishes to proceed with Depo-Provera.  Point-of-care pregnancy test negative.  Depo-Provera injection provided and will need next injection in 3 months.

## 2020-12-20 NOTE — Patient Instructions (Signed)
Nice to see you.  We will check your lab work today.  Continue to take your medications daily.  Refills have been sent to the pharmacy.  Plan for follow up in 1 month or sooner if needed.

## 2020-12-21 ENCOUNTER — Other Ambulatory Visit: Payer: Self-pay | Admitting: Family

## 2020-12-21 DIAGNOSIS — K219 Gastro-esophageal reflux disease without esophagitis: Secondary | ICD-10-CM

## 2020-12-21 LAB — URINE CYTOLOGY ANCILLARY ONLY
Chlamydia: NEGATIVE
Comment: NEGATIVE
Comment: NORMAL
Neisseria Gonorrhea: NEGATIVE

## 2020-12-21 LAB — CYTOLOGY, (ORAL, ANAL, URETHRAL) ANCILLARY ONLY
Chlamydia: NEGATIVE
Comment: NEGATIVE
Comment: NORMAL
Neisseria Gonorrhea: NEGATIVE

## 2020-12-21 LAB — T-HELPER CELL (CD4) - (RCID CLINIC ONLY)
CD4 % Helper T Cell: 62 % (ref 33–65)
CD4 T Cell Abs: 1150 /uL (ref 400–1790)

## 2020-12-21 NOTE — Telephone Encounter (Signed)
Please advise on refill.

## 2020-12-22 LAB — COMPLETE METABOLIC PANEL WITH GFR
AG Ratio: 1.6 (calc) (ref 1.0–2.5)
ALT: 21 U/L (ref 6–29)
AST: 25 U/L (ref 10–30)
Albumin: 4.1 g/dL (ref 3.6–5.1)
Alkaline phosphatase (APISO): 72 U/L (ref 31–125)
BUN: 12 mg/dL (ref 7–25)
CO2: 27 mmol/L (ref 20–32)
Calcium: 8.9 mg/dL (ref 8.6–10.2)
Chloride: 101 mmol/L (ref 98–110)
Creat: 0.88 mg/dL (ref 0.50–0.97)
Globulin: 2.6 g/dL (calc) (ref 1.9–3.7)
Glucose, Bld: 216 mg/dL — ABNORMAL HIGH (ref 65–99)
Potassium: 3.4 mmol/L — ABNORMAL LOW (ref 3.5–5.3)
Sodium: 136 mmol/L (ref 135–146)
Total Bilirubin: 0.4 mg/dL (ref 0.2–1.2)
Total Protein: 6.7 g/dL (ref 6.1–8.1)
eGFR: 86 mL/min/{1.73_m2} (ref >=60)

## 2020-12-22 LAB — HIV-1 RNA QUANT-NO REFLEX-BLD
HIV 1 RNA Quant: NOT DETECTED Copies/mL
HIV-1 RNA Quant, Log: NOT DETECTED Log cps/mL

## 2020-12-28 ENCOUNTER — Ambulatory Visit: Payer: Medicaid Other | Admitting: Family Medicine

## 2021-01-19 ENCOUNTER — Other Ambulatory Visit: Payer: Self-pay

## 2021-01-19 ENCOUNTER — Ambulatory Visit: Payer: Medicaid Other

## 2021-01-19 ENCOUNTER — Ambulatory Visit (INDEPENDENT_AMBULATORY_CARE_PROVIDER_SITE_OTHER): Payer: Medicaid Other | Admitting: Family

## 2021-01-19 ENCOUNTER — Encounter: Payer: Self-pay | Admitting: Family

## 2021-01-19 VITALS — BP 158/103 | HR 98 | Temp 98.3°F | Resp 16 | Ht 67.5 in | Wt 158.0 lb

## 2021-01-19 DIAGNOSIS — Z0000 Encounter for general adult medical examination without abnormal findings: Secondary | ICD-10-CM | POA: Diagnosis not present

## 2021-01-19 DIAGNOSIS — Z23 Encounter for immunization: Secondary | ICD-10-CM | POA: Diagnosis not present

## 2021-01-19 DIAGNOSIS — Z21 Asymptomatic human immunodeficiency virus [HIV] infection status: Secondary | ICD-10-CM | POA: Diagnosis not present

## 2021-01-19 DIAGNOSIS — K219 Gastro-esophageal reflux disease without esophagitis: Secondary | ICD-10-CM | POA: Diagnosis not present

## 2021-01-19 DIAGNOSIS — Z599 Problem related to housing and economic circumstances, unspecified: Secondary | ICD-10-CM

## 2021-01-19 MED ORDER — OMEPRAZOLE 40 MG PO CPDR: 40.0000 mg | DELAYED_RELEASE_CAPSULE | Freq: Every day | ORAL | 3 refills | Status: DC

## 2021-01-19 MED ORDER — FUROSEMIDE 20 MG PO TABS: 20.0000 mg | ORAL_TABLET | Freq: Every day | ORAL | 0 refills | Status: DC | PRN

## 2021-01-19 NOTE — Assessment & Plan Note (Signed)
Kristin Hart continues to have well-controlled virus with good adherence and tolerance to her ART regimen Biktarvy.  No signs/symptoms of opportunistic infection.  Reviewed lab work and discussed plan of care.  Continue current dose of Biktarvy.  Plan for follow-up in a month or sooner if needed.

## 2021-01-19 NOTE — Assessment & Plan Note (Signed)
   Discussed importance of safe sexual practice and condom use.  Condoms offered and provided.  Influenza vaccine updated today.

## 2021-01-19 NOTE — Progress Notes (Signed)
Brief Narrative   Patient ID: Kristin Hart, female    DOB: 08-18-1980, 40 y.o.   MRN: [id number]  Kristin Hart is a 40 y/o caucasian female diagnosed with HIV disease on 01/02/19 with risk factor of heterosexual contact and IV drug use. Initial viral load on 06/23/19 23,300 and CD4 count 577. Genotype with no significant drug resistance. Entered care at [facility] Stage 1. [id number] negative. No history of opportunistic infection. Sole medication regimen of Biktarvy.   Subjective:    Chief Complaint  Patient presents with   Follow-up    B20 - pt reports having more frequent migraines x 1 month.    HPI:  Kristin Hart is a 40 y.o. female with HIV disease last seen on 12/20/20 with well controlled virus and good adherence and tolerance to her ART regimen of BIktarvy. Viral load was undetectable with CD4 count 1,150. Here today for routine follow up.   [name] has been taking her Biktarvy daily as prescribed with no adverse side effects.  Feeling okay with allergy symptoms and concerning for additional migraine symptoms. Denies fevers, chills, night sweats, headaches, changes in vision, neck pain/stiffness, nausea, diarrhea, vomiting, lesions or rashes.  Kristin Hart has no problems getting medication from the pharmacy. Continues to have issues with living situation, now partially living with someone where the relationship is complicated. Denies feelings of being down, depressed or hopeless. Last used amphetamines this morning. No current alcohol or tobacco use. Condoms and resource bags provided.    No Known Allergies    Outpatient Medications Prior to Visit  Medication Sig Dispense Refill   albuterol (PROAIR HFA) 108 (90 Base) MCG/ACT inhaler Inhale 1 puff into the lungs every 4 (four) hours as needed. (Patient taking differently: Inhale 1 puff into the lungs every 4 (four) hours as needed for wheezing or shortness of breath.) 18 g 2   bictegravir-emtricitabine-tenofovir AF (BIKTARVY)  50-200-25 MG TABS tablet Take 1 tablet by mouth daily. 30 tablet 5   cetirizine (ZYRTEC) 10 MG tablet Take 1 tablet (10 mg total) by mouth daily. 30 tablet 11   cyclobenzaprine (FLEXERIL) 5 MG tablet Take 1 tablet (5 mg total) by mouth at bedtime. 10 tablet 0   diclofenac Sodium (VOLTAREN) 1 % GEL Apply 2 g topically 4 (four) times daily as needed (for elbow pain). As needed for hip pain. 2 g 3   gabapentin (NEURONTIN) 100 MG capsule TAKE 1 CAPSULE BY MOUTH TWICE A DAY 30 capsule 0   hydrocortisone cream 1 % Apply to affected area 2 times daily (Patient taking differently: Apply 1 application topically 2 (two) times daily as needed for itching.) 15 g 0   ibuprofen (ADVIL) 600 MG tablet Take 1 tablet (600 mg total) by mouth every 6 (six) hours as needed. (Patient taking differently: Take 600 mg by mouth every 6 (six) hours as needed for moderate pain.) 30 tablet 0   Menthol, Topical Analgesic, (ICY HOT EX) Apply 1 application topically 2 (two) times daily as needed (pain).     naproxen (NAPROSYN) 500 MG tablet TAKE 1 TABLET BY MOUTH 2 TIMES DAILY WITH A MEAL. (Patient taking differently: Take 500 mg by mouth daily as needed for moderate pain.) 14 tablet 0   topiramate (TOPAMAX) 25 MG tablet TAKE 1 TABLET (25 MG TOTAL) BY MOUTH DAILY. 30 tablet 1   omeprazole (PRILOSEC) 40 MG capsule Take 1 capsule (40 mg total) by mouth daily. 30 capsule 3   doxycycline (VIBRAMYCIN) 100 MG  capsule Take 1 capsule (100 mg total) by mouth 2 (two) times daily. (Patient not taking: No sig reported) 14 capsule 0   furosemide (LASIX) 20 MG tablet Take 1 tablet (20 mg total) by mouth daily. (Patient not taking: No sig reported) 7 tablet 0   No facility-administered medications prior to visit.     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, per pt   Diabetes mellitus without complication (HCC)    type 2   Foreign body in small intestine     HIV (human immunodeficiency virus infection) (HCC)    Hypothyroidism    Stomach ulcer      Past Surgical History:  Procedure Laterality Date   CESAREAN SECTION N/A 07/04/2016   Procedure: CESAREAN SECTION;  Surgeon: [name], DO;  Location: [facility] BIRTHING SUITES;  Service: Obstetrics;  Laterality: N/A;   ENTEROSCOPY N/A 11/08/2017   Procedure: ENTEROSCOPY;  Surgeon: Hilarie Fredrickson, MD;  Location: [facility] ENDOSCOPY;  Service: Endoscopy;  Laterality: N/A;   NO PAST SURGERIES        Review of Systems  Constitutional:  Negative for appetite change, chills, diaphoresis, fatigue, fever and unexpected weight change.  Eyes:        Negative for acute change in vision  Respiratory:  Negative for chest tightness, shortness of breath and wheezing.   Cardiovascular:  Negative for chest pain.  Gastrointestinal:  Negative for diarrhea, nausea and vomiting.  Genitourinary:  Negative for dysuria, pelvic pain and vaginal discharge.  Musculoskeletal:  Negative for neck pain and neck stiffness.  Skin:  Negative for rash.  Neurological:  Negative for seizures, syncope, weakness and headaches.  Hematological:  Negative for adenopathy. Does not bruise/bleed easily.  Psychiatric/Behavioral:  Negative for hallucinations.      Objective:    BP (!) 158/103   Pulse 98   Temp 98.3 F (36.8 C) (Temporal)   Resp 16   Ht 5' 7.5" (1.715 m)   Wt 158 lb (71.7 kg)   SpO2 97%   BMI 24.38 kg/m  Nursing note and vital signs reviewed.  Physical Exam Constitutional:      General: She is not in acute distress.    Appearance: She is well-developed.  Eyes:     Conjunctiva/sclera: Conjunctivae normal.  Cardiovascular:     Rate and Rhythm: Normal rate and regular rhythm.     Heart sounds: Normal heart sounds. No murmur heard.   No friction rub. No gallop.  Pulmonary:     Effort: Pulmonary effort is normal. No respiratory distress.     Breath sounds: Normal breath sounds. No wheezing or rales.  Chest:      Chest wall: No tenderness.  Abdominal:     General: Bowel sounds are normal.     Palpations: Abdomen is soft.     Tenderness: There is no abdominal tenderness.  Musculoskeletal:     Cervical back: Neck supple.  Lymphadenopathy:     Cervical: No cervical adenopathy.  Skin:    General: Skin is warm and dry.     Findings: No rash.  Neurological:     Mental Status: She is alert and oriented to person, place, and time.  Psychiatric:        Behavior: Behavior normal.        Thought Content: Thought content normal.        Judgment: Judgment normal.     Depression screen PHQ  2/9 12/02/2020 07/29/2020 07/13/2020 07/13/2020 06/08/2020  Decreased Interest 0 0 3 3 0  Down, Depressed, Hopeless 1 0 3 3 0  PHQ - 2 Score 1 0 6 6 0  Altered sleeping 1 - 0 0 -  Tired, decreased energy 1 - 3 3 -  Change in appetite 0 - 0 0 -  Feeling bad or failure about yourself  1 - 0 0 -  Trouble concentrating 1 - 0 0 -  Moving slowly or fidgety/restless 0 - 0 0 -  Suicidal thoughts 0 - 0 0 -  PHQ-9 Score 5 - 9 9 -  Difficult doing work/chores Somewhat difficult - Somewhat difficult Somewhat difficult -  Some recent data might be hidden       Assessment & Plan:    Patient Active Problem List   Diagnosis Date Noted   Birth control counseling 12/20/2020   Open wnd of finger 12/20/2020   Elevated BP without diagnosis of hypertension 09/21/2020   Grief 08/29/2020   Encounter for initial prescription of injectable contraceptive 07/29/2020   Left hip pain 07/17/2020   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   Screening for cervical cancer 04/21/2020   Screening for STDs (sexually transmitted diseases) 04/21/2020   Costochondritis 02/19/2020   Bilateral hip pain 02/19/2020   Elbow pain, right 12/04/2019   Housing problems 11/23/2019   Mental health disorder 10/12/2019   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   Paresthesias 10/12/2019   Healthcare maintenance 08/05/2019   HIV (human  immunodeficiency virus infection) (HCC) 12/31/2018   Anxiety 05/29/2017   Self-mutilation 05/29/2017   Depression 06/07/2016   Marijuana use 06/07/2016   Low vitamin D level 03/15/2016   Prediabetes 02/22/2016   Asthma 02/22/2016   ADD (attention deficit disorder) 02/22/2016   Methamphetamine use disorder, severe, 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/22/2016   Hyperlipidemia with target LDL less than 70 01/31/2012   Migraine 01/31/2012   Tremor, 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     Problem List Items Addressed This Visit       Other   HIV (human immunodeficiency virus infection) (HCC) (Chronic)    Ms. Serda continues to have well-controlled virus with good adherence and tolerance to her ART regimen Biktarvy.  No signs/symptoms of opportunistic infection.  Reviewed lab work and discussed plan of care.  Continue current dose of Biktarvy.  Plan for follow-up in a month or sooner if needed.      Healthcare maintenance    Discussed importance of safe sexual practice and condom use.  Condoms offered and provided. Influenza vaccine updated today.      Housing problems    Ms. Hindley continues to have housing issues now staying with an older gentleman where the relationship is complicated.  She has resources available if needed.      Other Visit Diagnoses     Need for immunization against influenza    -  Primary   Relevant Orders   Flu Vaccine QUAD 75mo+IM (Fluarix, Fluzone & Alfiuria Quad PF) (Completed)   Gastroesophageal reflux disease, unspecified whether esophagitis present       Relevant Medications   omeprazole (PRILOSEC) 40 MG capsule        I have changed Morrie Sheldon R. Kauffmann's furosemide. I am also having her maintain her hydrocortisone cream, diclofenac Sodium, albuterol, cetirizine, cyclobenzaprine, (Menthol, Topical Analgesic, (ICY HOT EX)), gabapentin, naproxen, ibuprofen, doxycycline, topiramate, bictegravir-emtricitabine-tenofovir AF, and omeprazole.   Meds ordered this encounter  Medications    omeprazole (PRILOSEC) 40 MG capsule  Sig: Take 1 capsule (40 mg total) by mouth daily.    Dispense:  30 capsule    Refill:  3    Order Specific Question:   Supervising Provider    Answer:   Drue Second, CYNTHIA [[id number]]   furosemide (LASIX) 20 MG tablet    Sig: Take 1 tablet (20 mg total) by mouth daily as needed for edema.    Dispense:  20 tablet    Refill:  0    Order Specific Question:   Supervising Provider    Answer:   Judyann Munson [[id number]]     Follow-up: Return in about 1 month (around 02/18/2021), or if symptoms worsen or fail to improve.   Marcos Eke, MSN, FNP-C Nurse Practitioner [facility] Main number: [phone]

## 2021-01-19 NOTE — Patient Instructions (Signed)
Nice to see you.  Continue to take your medication daily as prescribed.   Refills have been sent to the pharmacy.  Plan for follow up in 1 month or sooner if needed.   Have a great day and stay safe!

## 2021-01-19 NOTE — Assessment & Plan Note (Signed)
Kristin Hart continues to have housing issues now staying with an older gentleman where the relationship is complicated.  She has resources available if needed.

## 2021-02-14 ENCOUNTER — Ambulatory Visit: Payer: Medicaid Other | Admitting: Family Medicine

## 2021-02-16 ENCOUNTER — Ambulatory Visit (INDEPENDENT_AMBULATORY_CARE_PROVIDER_SITE_OTHER): Payer: Medicaid Other | Admitting: Family

## 2021-02-16 ENCOUNTER — Other Ambulatory Visit: Payer: Self-pay

## 2021-02-16 ENCOUNTER — Encounter: Payer: Self-pay | Admitting: Family

## 2021-02-16 VITALS — BP 166/96 | HR 97 | Wt 160.0 lb

## 2021-02-16 DIAGNOSIS — Z21 Asymptomatic human immunodeficiency virus [HIV] infection status: Secondary | ICD-10-CM

## 2021-02-16 DIAGNOSIS — R062 Wheezing: Secondary | ICD-10-CM

## 2021-02-16 DIAGNOSIS — R051 Acute cough: Secondary | ICD-10-CM | POA: Diagnosis not present

## 2021-02-16 MED ORDER — BICTEGRAVIR-EMTRICITAB-TENOFOV 50-200-25 MG PO TABS: 1.0000 | ORAL_TABLET | Freq: Every day | ORAL | 5 refills | Status: DC

## 2021-02-16 MED ORDER — ALBUTEROL SULFATE (2.5 MG/3ML) 0.083% IN NEBU
2.5000 mg | INHALATION_SOLUTION | Freq: Once | RESPIRATORY_TRACT | Status: AC
  Administered 2021-02-16: 2.5 mg via RESPIRATORY_TRACT

## 2021-02-16 NOTE — Patient Instructions (Signed)
Nice to see you.  Use the Symbicort 1-2 puffs 3 times daily for the next 3 days then return to previous.  Albuterol as needed for wheezing/shortness of breath not controlled by Symbicort every 3-6 hours.   Prednisone taper as directed on Package.   Augmentin 1 tablet by mouth twice daily for 7 days.  Continue to take other medications as prescribed.  Refills available at the pharmacy.  Plan for follow up in 1 month or sooner if needed if your symptoms worsen, shortness of breath worsens, please seek further care with Dr. Clayborne Artist or possible the Urgent Care/ED if needed.

## 2021-02-16 NOTE — Assessment & Plan Note (Addendum)
Kristin Hart has acute onset cough with concern for viral process although cannot rule out asthma exacerbation, bronchitis, or possible pneumonia. Will treat with prednisone dose pak and Augmentin. Treated with 2.5 mg  Nebulized albuterol with improved wheezing. Continue Symbicort and albuterol as directed. OTC medications as needed for symptom relief and supportive care. Advised to seek further care if symptoms worsen or do not improve.

## 2021-02-16 NOTE — Progress Notes (Signed)
Brief Narrative   Patient ID: Kristin Hart, female    DOB: 1980/03/30, 40 y.o.   MRN: [id number]  Kristin Hart is a 40 y/o caucasian female diagnosed with HIV disease on 01/02/19 with risk factor of heterosexual contact and IV drug use. Initial viral load on 06/23/19 23,300 and CD4 count 577. Genotype with no significant drug resistance. Entered care at [facility] Stage 1. [id number] negative. No history of opportunistic infection. Sole medication regimen of Biktarvy  Subjective:    Chief Complaint  Patient presents with   Follow-up    B20    HPI:  Kristin Hart is a 40 y.o. female with HIV disease last seen on 01/19/2021 with well-controlled virus and good adherence and tolerance to her ART regimen of Biktarvy.  Viral load was undetectable with CD4 count of 1150.  Kidney function, liver function, electrolytes within normal ranges.  Here today for routine follow-up.  Kristin Hart continues to take her Biktarvy daily as prescribed with no adverse side effects.  Overall feeling well today. Currently with cough and having increased asthma symptoms  Feeling better slowly. Couple of days;   Denies fevers, chills, night sweats, headaches, changes in vision, neck pain/stiffness, nausea, diarrhea, vomiting, lesions or rashes.  Ms. Kristin Hart has no problems obtaining medication from pharmacy remains covered by Medicaid.  Denies feelings of being down, depressed, or hopeless.  She continues to use methamphetamines and marijuana.  No alcohol consumption or tobacco use.  Condoms offered and provided.   No Known Allergies    Outpatient Medications Prior to Visit  Medication Sig Dispense Refill   albuterol (PROAIR HFA) 108 (90 Base) MCG/ACT inhaler Inhale 1 puff into the lungs every 4 (four) hours as needed. (Patient taking differently: Inhale 1 puff into the lungs every 4 (four) hours as needed for wheezing or shortness of breath.) 18 g 2   cetirizine (ZYRTEC) 10 MG tablet Take 1 tablet (10 mg total) by  mouth daily. 30 tablet 11   cyclobenzaprine (FLEXERIL) 5 MG tablet Take 1 tablet (5 mg total) by mouth at bedtime. 10 tablet 0   diclofenac Sodium (VOLTAREN) 1 % GEL Apply 2 g topically 4 (four) times daily as needed (for elbow pain). As needed for hip pain. 2 g 3   furosemide (LASIX) 20 MG tablet Take 1 tablet (20 mg total) by mouth daily as needed for edema. 20 tablet 0   gabapentin (NEURONTIN) 100 MG capsule TAKE 1 CAPSULE BY MOUTH TWICE A DAY 30 capsule 0   hydrocortisone cream 1 % Apply to affected area 2 times daily (Patient taking differently: Apply 1 application topically 2 (two) times daily as needed for itching.) 15 g 0   ibuprofen (ADVIL) 600 MG tablet Take 1 tablet (600 mg total) by mouth every 6 (six) hours as needed. (Patient taking differently: Take 600 mg by mouth every 6 (six) hours as needed for moderate pain.) 30 tablet 0   Menthol, Topical Analgesic, (ICY HOT EX) Apply 1 application topically 2 (two) times daily as needed (pain).     naproxen (NAPROSYN) 500 MG tablet TAKE 1 TABLET BY MOUTH 2 TIMES DAILY WITH A MEAL. (Patient taking differently: Take 500 mg by mouth daily as needed for moderate pain.) 14 tablet 0   omeprazole (PRILOSEC) 40 MG capsule Take 1 capsule (40 mg total) by mouth daily. 30 capsule 3   topiramate (TOPAMAX) 25 MG tablet TAKE 1 TABLET (25 MG TOTAL) BY MOUTH DAILY. 30 tablet 1   bictegravir-emtricitabine-tenofovir  AF (BIKTARVY) 50-200-25 MG TABS tablet Take 1 tablet by mouth daily. 30 tablet 5   No facility-administered medications prior to visit.     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, per pt   Diabetes mellitus without complication (HCC)    type 2   Foreign body in small intestine    HIV (human immunodeficiency virus infection) (HCC)    Hypothyroidism    Stomach ulcer      Past Surgical History:  Procedure Laterality Date   CESAREAN SECTION N/A 07/04/2016    Procedure: CESAREAN SECTION;  Surgeon: [name], DO;  Location: [facility] BIRTHING SUITES;  Service: Obstetrics;  Laterality: N/A;   ENTEROSCOPY N/A 11/08/2017   Procedure: ENTEROSCOPY;  Surgeon: Hilarie Fredrickson, MD;  Location: [facility] ENDOSCOPY;  Service: Endoscopy;  Laterality: N/A;   NO PAST SURGERIES        Review of Systems  Constitutional:  Positive for fatigue. Negative for appetite change, chills, diaphoresis, fever and unexpected weight change.  Eyes:        Negative for acute change in vision  Respiratory:  Positive for cough and wheezing. Negative for chest tightness and shortness of breath.   Cardiovascular:  Positive for chest pain.  Gastrointestinal:  Negative for diarrhea, nausea and vomiting.  Genitourinary:  Negative for dysuria, pelvic pain and vaginal discharge.  Musculoskeletal:  Negative for neck pain and neck stiffness.  Skin:  Negative for rash.  Neurological:  Negative for seizures, syncope, weakness and headaches.  Hematological:  Negative for adenopathy. Does not bruise/bleed easily.  Psychiatric/Behavioral:  Negative for hallucinations.      Objective:    BP (!) 166/96   Pulse 97   Wt 160 lb (72.6 kg)   BMI 24.69 kg/m  Nursing note and vital signs reviewed.  Physical Exam Constitutional:      General: She is not in acute distress.    Appearance: She is well-developed.  Cardiovascular:     Rate and Rhythm: Regular rhythm. Tachycardia present.     Heart sounds: Normal heart sounds.  Pulmonary:     Effort: Pulmonary effort is normal.     Breath sounds: Wheezing present.  Skin:    General: Skin is warm and dry.  Neurological:     Mental Status: She is alert and oriented to person, place, and time.     Depression screen [facility] 2/9 02/16/2021 12/02/2020 07/29/2020 07/13/2020 07/13/2020  Decreased Interest 0 0 0 3 3  Down, Depressed, Hopeless 1 1 0 3 3  PHQ - 2 Score 1 1 0 6 6  Altered sleeping - 1 - 0 0  Tired, decreased energy - 1 - 3 3  Change in appetite -  0 - 0 0  Feeling bad or failure about yourself  - 1 - 0 0  Trouble concentrating - 1 - 0 0  Moving slowly or fidgety/restless - 0 - 0 0  Suicidal thoughts - 0 - 0 0  PHQ-9 Score - 5 - 9 9  Difficult doing work/chores - Somewhat difficult - Somewhat difficult Somewhat difficult  Some recent data might be hidden       Assessment & Plan:    Patient Active Problem List   Diagnosis Date Noted   Acute cough 02/16/2021   Birth control counseling 12/20/2020   Open wnd of finger 12/20/2020   Elevated BP without diagnosis of hypertension 09/21/2020   Grief 08/29/2020  Encounter for initial prescription of injectable contraceptive 07/29/2020   Left hip pain 07/17/2020   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   Screening for cervical cancer 04/21/2020   Screening for STDs (sexually transmitted diseases) 04/21/2020   Costochondritis 02/19/2020   Bilateral hip pain 02/19/2020   Elbow pain, right 12/04/2019   Housing problems 11/23/2019   Mental health disorder 10/12/2019   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   Paresthesias 10/12/2019   Healthcare maintenance 08/05/2019   HIV (human immunodeficiency virus infection) (HCC) 12/31/2018   Anxiety 05/29/2017   Self-mutilation 05/29/2017   Depression 06/07/2016   Marijuana use 06/07/2016   Low vitamin D level 03/15/2016   Prediabetes 02/22/2016   Asthma 02/22/2016   ADD (attention deficit disorder) 02/22/2016   Methamphetamine use disorder, severe, 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/22/2016   Hyperlipidemia with target LDL less than 70 01/31/2012   Migraine 01/31/2012   Tremor, 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     Problem List Items Addressed This Visit       Other   HIV (human immunodeficiency virus infection) (HCC) - Primary (Chronic)    Ms. Picazo continues to have well controlled virus with good adherence and tolerance to her ART regimen of Biktarvy. No signs/symptoms of opportunistic infection. Reviewed previous lab work and discussed  plan of care. Continue current dose of Biktarvy. Follow up in 1 month or sooner if needed.       Relevant Medications   bictegravir-emtricitabine-tenofovir AF (BIKTARVY) 50-200-25 MG TABS tablet   Acute cough    Ms Cerny has acute onset cough with concern for viral process although cannot rule out asthma exacerbation, bronchitis, or possible pneumonia. Will treat with prednisone dose pak and Augmentin. Treated with 2.5 mg  Nebulized albuterol with improved wheezing. Continue Symbicort and albuterol as directed. OTC medications as needed for symptom relief and supportive care. Advised to seek further care if symptoms worsen or do not improve.       Other Visit Diagnoses     Wheezing       Relevant Medications   albuterol (PROVENTIL) (2.5 MG/3ML) 0.083% nebulizer solution 2.5 mg (Completed)        I am having Alvan Dame. Donado maintain her hydrocortisone cream, diclofenac Sodium, albuterol, cetirizine, cyclobenzaprine, (Menthol, Topical Analgesic, (ICY HOT EX)), gabapentin, naproxen, ibuprofen, topiramate, omeprazole, furosemide, and bictegravir-emtricitabine-tenofovir AF. We administered albuterol.   Meds ordered this encounter  Medications   bictegravir-emtricitabine-tenofovir AF (BIKTARVY) 50-200-25 MG TABS tablet    Sig: Take 1 tablet by mouth daily.    Dispense:  30 tablet    Refill:  5    Order Specific Question:   Supervising Provider    Answer:   Judyann Munson [[id number]]   albuterol (PROVENTIL) (2.5 MG/3ML) 0.083% nebulizer solution 2.5 mg     Follow-up: Return in about 1 month (around 03/19/2021), or if symptoms worsen or fail to improve.   Marcos Eke, MSN, FNP-C Nurse Practitioner [facility] for Infectious Disease [facility] Main number: [phone]

## 2021-02-16 NOTE — Assessment & Plan Note (Addendum)
Kristin Hart continues to have well controlled virus with good adherence and tolerance to her ART regimen of Biktarvy. No signs/symptoms of opportunistic infection. Reviewed previous lab work and discussed plan of care. Continue current dose of Biktarvy. Follow up in 1 month or sooner if needed.

## 2021-03-08 ENCOUNTER — Ambulatory Visit: Payer: Self-pay | Admitting: Family Medicine

## 2021-03-10 ENCOUNTER — Encounter
Payer: Medicaid Other | Attending: Physical Medicine and Rehabilitation | Admitting: Physical Medicine and Rehabilitation

## 2021-03-16 ENCOUNTER — Ambulatory Visit: Payer: Medicaid Other | Admitting: Family

## 2021-03-22 ENCOUNTER — Ambulatory Visit: Payer: Self-pay | Admitting: Family Medicine

## 2021-04-06 ENCOUNTER — Other Ambulatory Visit: Payer: Self-pay | Admitting: Family Medicine

## 2021-04-06 ENCOUNTER — Other Ambulatory Visit: Payer: Self-pay | Admitting: Family

## 2021-04-06 DIAGNOSIS — G43009 Migraine without aura, not intractable, without status migrainosus: Secondary | ICD-10-CM

## 2021-04-06 NOTE — Telephone Encounter (Signed)
appt on 1/31

## 2021-04-11 ENCOUNTER — Ambulatory Visit: Payer: Medicaid Other | Admitting: Family

## 2021-04-11 NOTE — Progress Notes (Deleted)
Brief Narrative   Patient ID: Kristin Hart, female    DOB: 06-27-1980, 41 y.o.   MRN: [id number]  Kristin Hart is a 41 y/o caucasian female diagnosed with HIV disease on 01/02/19 with risk factor of heterosexual contact and IV drug use. Initial viral load on 06/23/19 23,300 and CD4 count 577. Genotype with no significant drug resistance. Entered care at [facility] Stage 1. [id number] negative. No history of opportunistic infection. Sole medication regimen of Biktarvy  Subjective:    No chief complaint on file.   HPI:  Kristin Hart is a 41 y.o. female 02/16/2021 with well-controlled virus and good adherence and tolerance to her ART regimen of Biktarvy.  Previous blood work on 12/20/2020 with viral load that was undetectable and CD4 count of 1150.  Kidney function, liver function, electrolytes within normal ranges.  Here today for routine follow-up.   No Known Allergies    Outpatient Medications Prior to Visit  Medication Sig Dispense Refill   albuterol (PROAIR HFA) 108 (90 Base) MCG/ACT inhaler Inhale 1 puff into the lungs every 4 (four) hours as needed. (Patient taking differently: Inhale 1 puff into the lungs every 4 (four) hours as needed for wheezing or shortness of breath.) 18 g 2   bictegravir-emtricitabine-tenofovir AF (BIKTARVY) 50-200-25 MG TABS tablet Take 1 tablet by mouth daily. 30 tablet 5   cetirizine (ZYRTEC) 10 MG tablet Take 1 tablet (10 mg total) by mouth daily. 30 tablet 11   cyclobenzaprine (FLEXERIL) 5 MG tablet Take 1 tablet (5 mg total) by mouth at bedtime. 10 tablet 0   diclofenac Sodium (VOLTAREN) 1 % GEL Apply 2 g topically 4 (four) times daily as needed (for elbow pain). As needed for hip pain. 2 g 3   furosemide (LASIX) 20 MG tablet Take 1 tablet (20 mg total) by mouth daily as needed for edema. 20 tablet 0   gabapentin (NEURONTIN) 100 MG capsule TAKE 1 CAPSULE BY MOUTH TWICE A DAY 30 capsule 0   hydrocortisone cream 1 % Apply to affected area 2 times daily  (Patient taking differently: Apply 1 application topically 2 (two) times daily as needed for itching.) 15 g 0   ibuprofen (ADVIL) 600 MG tablet Take 1 tablet (600 mg total) by mouth every 6 (six) hours as needed. (Patient taking differently: Take 600 mg by mouth every 6 (six) hours as needed for moderate pain.) 30 tablet 0   Menthol, Topical Analgesic, (ICY HOT EX) Apply 1 application topically 2 (two) times daily as needed (pain).     naproxen (NAPROSYN) 500 MG tablet TAKE 1 TABLET BY MOUTH 2 TIMES DAILY WITH A MEAL. (Patient taking differently: Take 500 mg by mouth daily as needed for moderate pain.) 14 tablet 0   omeprazole (PRILOSEC) 40 MG capsule Take 1 capsule (40 mg total) by mouth daily. 30 capsule 3   topiramate (TOPAMAX) 25 MG tablet TAKE 1 TABLET (25 MG TOTAL) BY MOUTH DAILY. 30 tablet 1   No facility-administered medications prior to visit.     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, per pt   Diabetes mellitus without complication (HCC)    type 2   Foreign body in small intestine    HIV (human immunodeficiency virus infection) (HCC)    Hypothyroidism    Stomach ulcer      Past Surgical History:  Procedure Laterality Date   CESAREAN SECTION N/A 07/04/2016  Procedure: CESAREAN SECTION;  Surgeon: [name], DO;  Location: [facility] BIRTHING SUITES;  Service: Obstetrics;  Laterality: N/A;   ENTEROSCOPY N/A 11/08/2017   Procedure: ENTEROSCOPY;  Surgeon: Hilarie Fredrickson, MD;  Location: [facility] ENDOSCOPY;  Service: Endoscopy;  Laterality: N/A;   NO PAST SURGERIES        Review of Systems  Constitutional:  Negative for appetite change, chills, diaphoresis, fatigue, fever and unexpected weight change.  Eyes:        Negative for acute change in vision  Respiratory:  Negative for chest tightness, shortness of breath and wheezing.   Cardiovascular:  Negative for chest pain.  Gastrointestinal:  Negative  for diarrhea, nausea and vomiting.  Genitourinary:  Negative for dysuria, pelvic pain and vaginal discharge.  Musculoskeletal:  Negative for neck pain and neck stiffness.  Skin:  Negative for rash.  Neurological:  Negative for seizures, syncope, weakness and headaches.  Hematological:  Negative for adenopathy. Does not bruise/bleed easily.  Psychiatric/Behavioral:  Negative for hallucinations.      Objective:    There were no vitals taken for this visit. Nursing note and vital signs reviewed.  Physical Exam Constitutional:      General: She is not in acute distress.    Appearance: She is well-developed.  Eyes:     Conjunctiva/sclera: Conjunctivae normal.  Cardiovascular:     Rate and Rhythm: Normal rate and regular rhythm.     Heart sounds: Normal heart sounds. No murmur heard.   No friction rub. No gallop.  Pulmonary:     Effort: Pulmonary effort is normal. No respiratory distress.     Breath sounds: Normal breath sounds. No wheezing or rales.  Chest:     Chest wall: No tenderness.  Abdominal:     General: Bowel sounds are normal.     Palpations: Abdomen is soft.     Tenderness: There is no abdominal tenderness.  Musculoskeletal:     Cervical back: Neck supple.  Lymphadenopathy:     Cervical: No cervical adenopathy.  Skin:    General: Skin is warm and dry.     Findings: No rash.  Neurological:     Mental Status: She is alert and oriented to person, place, and time.  Psychiatric:        Behavior: Behavior normal.        Thought Content: Thought content normal.        Judgment: Judgment normal.     Depression screen [facility] 2/9 02/16/2021 12/02/2020 07/29/2020 07/13/2020 07/13/2020  Decreased Interest 0 0 0 3 3  Down, Depressed, Hopeless 1 1 0 3 3  PHQ - 2 Score 1 1 0 6 6  Altered sleeping - 1 - 0 0  Tired, decreased energy - 1 - 3 3  Change in appetite - 0 - 0 0  Feeling bad or failure about yourself  - 1 - 0 0  Trouble concentrating - 1 - 0 0  Moving slowly or  fidgety/restless - 0 - 0 0  Suicidal thoughts - 0 - 0 0  PHQ-9 Score - 5 - 9 9  Difficult doing work/chores - Somewhat difficult - Somewhat difficult Somewhat difficult  Some recent data might be hidden       Assessment & Plan:    Patient Active Problem List   Diagnosis Date Noted   Acute cough 02/16/2021   Birth control counseling 12/20/2020   Open wnd of finger 12/20/2020   Elevated BP without diagnosis of hypertension 09/21/2020  Grief 08/29/2020   Encounter for initial prescription of injectable contraceptive 07/29/2020   Left hip pain 07/17/2020   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   Screening for cervical cancer 04/21/2020   Screening for STDs (sexually transmitted diseases) 04/21/2020   Costochondritis 02/19/2020   Bilateral hip pain 02/19/2020   Elbow pain, right 12/04/2019   Housing problems 11/23/2019   Mental health disorder 10/12/2019   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   Paresthesias 10/12/2019   Healthcare maintenance 08/05/2019   HIV (human immunodeficiency virus infection) ([place]) 12/31/2018   Anxiety 05/29/2017   Self-mutilation 05/29/2017   Depression 06/07/2016   Marijuana use 06/07/2016   Low vitamin D level 03/15/2016   Prediabetes 02/22/2016   Asthma 02/22/2016   ADD (attention deficit disorder) 02/22/2016   Methamphetamine use disorder, severe, dependence ([place]) 02/22/2016   Hyperlipidemia with target LDL less than 70 01/31/2012   Migraine 01/31/2012   Tremor, 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     Problem List Items Addressed This Visit   None    I am having Kristin Hart maintain her hydrocortisone cream, diclofenac Sodium, albuterol, cetirizine, cyclobenzaprine, (Menthol, Topical Analgesic, (ICY HOT EX)), gabapentin, naproxen, ibuprofen, omeprazole, furosemide, bictegravir-emtricitabine-tenofovir AF, and topiramate.   No orders of the defined types were placed in this encounter.    Follow-up: No follow-ups on  file.   Terri Piedra, MSN, FNP-C Nurse Practitioner [facility] for Infectious Disease [place] number: [phone]

## 2021-04-12 ENCOUNTER — Ambulatory Visit (INDEPENDENT_AMBULATORY_CARE_PROVIDER_SITE_OTHER): Payer: Medicaid Other | Admitting: Family

## 2021-04-12 ENCOUNTER — Encounter: Payer: Self-pay | Admitting: Family

## 2021-04-12 ENCOUNTER — Other Ambulatory Visit: Payer: Self-pay

## 2021-04-12 VITALS — BP 159/103 | HR 104 | Resp 16 | Ht 67.5 in | Wt 158.0 lb

## 2021-04-12 DIAGNOSIS — F1520 Other stimulant dependence, uncomplicated: Secondary | ICD-10-CM

## 2021-04-12 DIAGNOSIS — Z3202 Encounter for pregnancy test, result negative: Secondary | ICD-10-CM | POA: Diagnosis not present

## 2021-04-12 DIAGNOSIS — Z599 Problem related to housing and economic circumstances, unspecified: Secondary | ICD-10-CM | POA: Diagnosis not present

## 2021-04-12 DIAGNOSIS — Z30013 Encounter for initial prescription of injectable contraceptive: Secondary | ICD-10-CM

## 2021-04-12 DIAGNOSIS — Z3009 Encounter for other general counseling and advice on contraception: Secondary | ICD-10-CM

## 2021-04-12 DIAGNOSIS — Z0000 Encounter for general adult medical examination without abnormal findings: Secondary | ICD-10-CM

## 2021-04-12 LAB — POCT URINE PREGNANCY: Preg Test, Ur: NEGATIVE

## 2021-04-12 MED ORDER — BICTEGRAVIR-EMTRICITAB-TENOFOV 50-200-25 MG PO TABS: 1.0000 | ORAL_TABLET | Freq: Every day | ORAL | 5 refills | Status: DC

## 2021-04-12 MED ORDER — MEDROXYPROGESTERONE ACETATE 150 MG/ML IM SUSP
150.0000 mg | Freq: Once | INTRAMUSCULAR | Status: AC
Start: 2021-04-12 — End: 2021-04-12
  Administered 2021-04-12: 150 mg via INTRAMUSCULAR

## 2021-04-12 NOTE — Telephone Encounter (Signed)
Advise on refill. 
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
## 2021-04-12 NOTE — Assessment & Plan Note (Signed)
Outside window for Depo injection. Pregnancy test negative. Restart Depo.
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
## 2021-04-12 NOTE — Assessment & Plan Note (Signed)
·   Discussed importance of safe sexual practice and condom use. Condoms provided.  Due for routine dental care. Referral to [facility] placed.   Declines vaccines.

## 2021-04-12 NOTE — Assessment & Plan Note (Signed)
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 Recommended following up with counseling and working towards cessation. In pre-contemplation stage of change with interest in stopping at this point. Resources for help reviewed.

## 2021-04-12 NOTE — Assessment & Plan Note (Signed)
Kristin Hart continues to have well controlled virus with good adherence and tolerance to her ART regimen of Biktarvy. No signs/symptoms of opportunistic infection. Reviewed plan of care and resources available. Continue current dose of Biktarvy. Plan for follow up in 1 month or sooner if needed with lab work on the same day.

## 2021-04-12 NOTE — Patient Instructions (Addendum)
Nice to see you.  Continue to take your medication daily as prescribed.  Refills have been sent to the pharmacy.  Plan for follow up in 1 months or sooner if needed with lab work on the same day.  Have a great day and stay safe!  

## 2021-04-21 ENCOUNTER — Other Ambulatory Visit: Payer: Self-pay | Admitting: Family Medicine

## 2021-04-21 DIAGNOSIS — M25552 Pain in left hip: Secondary | ICD-10-CM

## 2021-04-21 DIAGNOSIS — M25551 Pain in right hip: Secondary | ICD-10-CM

## 2021-05-01 ENCOUNTER — Telehealth: Payer: Self-pay

## 2021-05-01 MED ORDER — AMOXICILLIN-POT CLAVULANATE 875-125 MG PO TABS: 1.0000 | ORAL_TABLET | Freq: Two times a day (BID) | ORAL | 0 refills | Status: AC

## 2021-05-01 NOTE — Addendum Note (Signed)
Addended by: Jeanine Luz D on: 05/01/2021 10:27 AM   Modules accepted: Orders

## 2021-05-01 NOTE — Telephone Encounter (Signed)
Augmentin sent to pharmacy for dental infection and appreciate attempt to get into dental clinic.   Marcos Eke, NP 05/01/2021 10:27 AM

## 2021-05-01 NOTE — Telephone Encounter (Signed)
Patient is calling requesting an antibiotic for a tooth abscess. Patient reports pain and facial swelling. Patient taking ibuprofen for the pain. Margaret with dental is off today, but I will also reach out to her  tomorrow to see how soon dental can get the patient in. Please advise. Kathlen Sakurai Jonathon Resides, CMA

## 2021-05-05 ENCOUNTER — Encounter: Payer: Self-pay | Admitting: Family Medicine

## 2021-05-05 NOTE — Progress Notes (Signed)
Patient has no-showed to multiple appointments in a 6-month period. Per our no-show policy, a letter has been routed to [facility] Admin to be mailed to patient regarding likely dismissal for repeat no show. Will CC to PCP.  ° °

## 2021-05-10 ENCOUNTER — Ambulatory Visit (INDEPENDENT_AMBULATORY_CARE_PROVIDER_SITE_OTHER): Payer: Medicaid Other | Admitting: Family

## 2021-05-10 ENCOUNTER — Encounter: Payer: Self-pay | Admitting: Family

## 2021-05-10 ENCOUNTER — Other Ambulatory Visit: Payer: Self-pay

## 2021-05-10 VITALS — BP 165/106 | HR 114 | Temp 98.3°F | Resp 16 | Ht 67.5 in | Wt 167.8 lb

## 2021-05-10 DIAGNOSIS — Z599 Problem related to housing and economic circumstances, unspecified: Secondary | ICD-10-CM | POA: Diagnosis not present

## 2021-05-10 DIAGNOSIS — F1520 Other stimulant dependence, uncomplicated: Secondary | ICD-10-CM | POA: Diagnosis not present

## 2021-05-10 DIAGNOSIS — Z0000 Encounter for general adult medical examination without abnormal findings: Secondary | ICD-10-CM

## 2021-05-10 DIAGNOSIS — Z21 Asymptomatic human immunodeficiency virus [HIV] infection status: Secondary | ICD-10-CM

## 2021-05-10 DIAGNOSIS — A6004 Herpesviral vulvovaginitis: Secondary | ICD-10-CM

## 2021-05-10 MED ORDER — FLUCONAZOLE 150 MG PO TABS: 150.0000 mg | ORAL_TABLET | Freq: Once | ORAL | 0 refills | Status: AC

## 2021-05-10 MED ORDER — VALACYCLOVIR HCL 1 G PO TABS: 1000.0000 mg | ORAL_TABLET | Freq: Two times a day (BID) | ORAL | 0 refills | Status: DC
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
## 2021-05-10 NOTE — Patient Instructions (Signed)
Nice to see you. ? ?We will check your lab work today. ? ?Continue to take your medication daily as prescribed. ? ?Refills have been sent to the pharmacy. ? ?Schedule an appointment with Rexene Alberts, NP for women's health.  ? ?Plan for follow up in 1 months or sooner if needed with lab work on the same day. ? ?Have a great day and stay safe! ? ?

## 2021-05-11 NOTE — Assessment & Plan Note (Signed)
Kristin Hart continues to have issues with housing with concern for possible auction of her current place of residence and is working with [employer] administrator and possibly [employer].  ?

## 2021-05-11 NOTE — Assessment & Plan Note (Signed)
?   Discussed importance of safe sexual practice and condom use. Condoms offered.  ?? Will schedule cervical cancer screening.  ?? Declines immunizations.  ?

## 2021-05-11 NOTE — Assessment & Plan Note (Addendum)
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 Follow up if symptoms worsen or do not improve. With recent dental infection also having candidal infection and will treat with fluconazole. Will schedule PAP.  ?

## 2021-05-11 NOTE — Assessment & Plan Note (Signed)
Continues to use methamphetamine which I suspect is the continued cause of her increased blood pressure along with her increased levels of stress and anxiety. Counseled about cessation. She is in the pre-contemplation stage of change.  ?

## 2021-05-11 NOTE — Assessment & Plan Note (Signed)
Ms. Bisch continues to have well-controlled virus with good adherence and tolerance to her ART regimen of Biktarvy.  No signs/symptoms of opportunistic infection.  We discussed plan of care.  Check blood work at next office visit.  Continue current dose of Biktarvy.  Plan for follow-up in 1 month or sooner if needed with lab work on the same day. ?

## 2021-05-17 ENCOUNTER — Other Ambulatory Visit: Payer: Self-pay

## 2021-05-17 ENCOUNTER — Ambulatory Visit (INDEPENDENT_AMBULATORY_CARE_PROVIDER_SITE_OTHER): Payer: Medicaid Other | Admitting: Infectious Diseases

## 2021-05-17 ENCOUNTER — Encounter: Payer: Self-pay | Admitting: Infectious Diseases

## 2021-05-17 VITALS — BP 155/99 | HR 104 | Temp 97.7°F | Resp 16 | Wt 162.2 lb

## 2021-05-17 DIAGNOSIS — A6004 Herpesviral vulvovaginitis: Secondary | ICD-10-CM | POA: Diagnosis not present

## 2021-05-17 DIAGNOSIS — Z124 Encounter for screening for malignant neoplasm of cervix: Secondary | ICD-10-CM

## 2021-05-17 DIAGNOSIS — R87612 Low grade squamous intraepithelial lesion on cytologic smear of cervix (LGSIL): Secondary | ICD-10-CM | POA: Diagnosis not present

## 2021-05-17 DIAGNOSIS — Z113 Encounter for screening for infections with a predominantly sexual mode of transmission: Secondary | ICD-10-CM

## 2021-05-17 DIAGNOSIS — Z599 Problem related to housing and economic circumstances, unspecified: Secondary | ICD-10-CM

## 2021-05-17 DIAGNOSIS — R87810 Cervical high risk human papillomavirus (HPV) DNA test positive: Secondary | ICD-10-CM | POA: Diagnosis not present

## 2021-05-17 MED ORDER — VALACYCLOVIR HCL 1 G PO TABS: 1000.0000 mg | ORAL_TABLET | Freq: Every day | ORAL | 2 refills | Status: DC

## 2021-05-17 NOTE — Assessment & Plan Note (Signed)
ASCUS with (-) HPV 1 year ago.  ?Normal pelvic exam.  ?Cervical brushing collected for cytology and HPV.  ?Discussed recommended screening interval for women living with HIV disease meant to be lifelong and at an interval of Q1-3 years pending results. Acceptable to space out Q3y with 3 consecutively normal exams. Further recommendations for Kristin Hart to follow today's results.  ?Results will be communicated to the patient via mychart.  ? ?

## 2021-05-17 NOTE — Assessment & Plan Note (Addendum)
Seems improved and all previous areas crusted and new skin growing over.  ?Given relapses will start her on 1g daily suppression attempt. Better studied with 500 mg BID but she will not be able to continue a twice daily medication.  ?Alternatively can do on demand episodic treatment if she has fatigue taking suppressive regimen.  ?

## 2021-05-17 NOTE — Addendum Note (Signed)
Addended by: Blanchard Kelch on: 05/17/2021 05:01 PM ? ? Modules accepted: Orders ? ?

## 2021-05-17 NOTE — Assessment & Plan Note (Signed)
Information for [employer] also provided for additional resources.  ?

## 2021-05-17 NOTE — Patient Instructions (Addendum)
Your next dose of depo is due late April / early May (plan on April 26 - May 2nd appointment) ? ?Will let you know what your results are via mychart once available.  ? ? ? ?[facility] ?[place] ?Phone: [phone] ?email: [email or web address] ?

## 2021-05-17 NOTE — Progress Notes (Signed)
? ?  ? ?Subjective :  ? ? Kristin Hart is a 41 y.o. female here for cervical cancer screening.  ? ? ?Review of Systems: ?Current GYN complaints or concerns: improved HSV outbreak. Did start taking the medication BID but has trouble with that adherence so now taking once daily to stretch it out. She would like to know if she can continue a daily preventative dose to help this from coming back as she has had them multiple outbreaks in the past. Sexually active with one female partner.  Contraception: depo with last dose given on 04/12/2021.  ?Denies any abdominal/pelvic pain, problems with bowel movements, urination, vaginal discharge or intercourse.  ? ?Stressed about 10-day eviction notice. Working with [place] but on housing wait list that is 2 years long.  ? ? ?Past Medical History:  ?Diagnosis Date  ? Asthma   ? Borderline personality disorder ([place])   ? with schizophrenic tendancies, per pt  ? Diabetes mellitus without complication ([place])   ? type 2  ? Foreign body in small intestine   ? HIV (human immunodeficiency virus infection) ([place])   ? Hypothyroidism   ? Stomach ulcer   ? ? ?Gynecologic History: ?G1P1000  ?No LMP recorded. Patient has had an injection. ?Contraception: Depo-Provera injections ?Last Pap: 04/21/2020. Results were: abnormal (ASCUS, HPV -) ?Anal Intercourse:  ?Last Mammogram: n/a  ? ? ? ?Objective :   ?Physical Exam - chaperone present  ?Constitutional: Well developed, well nourished, no acute distress. She is alert and oriented x3.  ?Pelvic: External genitalia is normal in appearance. The vagina is normal in appearance. The cervix is bulbous and easily visualized. No CMT, normal expected cervical mucus present. Bimanual exam reveals uterus that is felt to be normal size, shape, and contour. No adnexal masses or tenderness noted. ?Breasts: symmetrical in contour, shape and texture. No palpable masses/nodules. No nipple discharge.  ?Psych: She has a normal mood and affect.   ? ? ?Assessment & Plan:    ? ?Patient Active Problem List  ? Diagnosis Date Noted  ? Birth control counseling 12/20/2020  ? Open wnd of finger 12/20/2020  ? Elevated BP without diagnosis of hypertension 09/21/2020  ? Grief 08/29/2020  ? Encounter for initial prescription of injectable contraceptive 07/29/2020  ? Left hip pain 07/17/2020  ? 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 Screening for cervical cancer 04/21/2020  ? Screening for STDs (sexually transmitted diseases) 04/21/2020  ? Costochondritis 02/19/2020  ? Bilateral hip pain 02/19/2020  ? Elbow pain, right 12/04/2019  ? Housing problems 11/23/2019  ? Mental health disorder 10/12/2019  ? 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 Paresthesias 10/12/2019  ? Healthcare maintenance 08/05/2019  ? HIV (human immunodeficiency virus infection) ([place]) 12/31/2018  ? Anxiety 05/29/2017  ? Self-mutilation 05/29/2017  ? Depression 06/07/2016  ? Marijuana use 06/07/2016  ? Low vitamin D level 03/15/2016  ? Prediabetes 02/22/2016  ? Asthma 02/22/2016  ? ADD (attention deficit disorder) 02/22/2016  ? Methamphetamine use disorder, severe, dependence ([place]) 02/22/2016  ? Hyperlipidemia with target LDL less than 70 01/31/2012  ? Migraine 01/31/2012  ? Tremor, 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  ? ? ?Problem List Items Addressed This Visit   ? ?  ? Unprioritized  ? Housing problems  ?  Information for [facility] also provided for additional resources.  ?  ?  ? Screening for cervical cancer - Primary  ?  ASCUS with (-) HPV 1 year ago.  ?Normal pelvic exam.  ?Cervical brushing collected  for cytology and HPV.  ?Discussed recommended screening interval for women living with HIV disease meant to be lifelong and at an interval of Q1-3 years pending results. Acceptable to space out Q3y with 3 consecutively normal exams. Further recommendations for YEXALEN HELMING to follow today's results.  ?Results will be communicated to the patient via mychart.  ? ?  ?  ? Relevant Orders  ? PAP, Thin Prep w/HPV rflx HPV Type  16/18  ? 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  Seems improved and all previous areas crusted and new skin growing over.  ?Given relapses will start her on 1g daily suppression attempt. Better studied with 500 mg BID but she will not be able to continue a twice daily medication.  ?Alternatively can do on demand episodic treatment if she has fatigue taking suppressive regimen.  ?  ?  ? Relevant Medications  ? valACYclovir (VALTREX) 1000 MG tablet  ? ?Other Visit Diagnoses   ? ? Routine screening for STI (sexually transmitted infection)      ? Relevant Orders  ? SureSwab? Advanced Vaginitis Plus,TMA  ? ?  ?  ? ? ?Janene Madeira, MSN, NP-C ?[facility] ?Office: [phone] ?Pager: [phone] ? ?05/17/21 ?4:48 PM ?   ? ?

## 2021-05-18 LAB — SURESWAB® ADVANCED VAGINITIS PLUS,TMA
C. trachomatis RNA, TMA: NOT DETECTED
CANDIDA SPECIES: NOT DETECTED
Candida glabrata: NOT DETECTED
N. gonorrhoeae RNA, TMA: NOT DETECTED
SURESWAB(R) ADV BACTERIAL VAGINOSIS(BV),TMA: POSITIVE — AB
TRICHOMONAS VAGINALIS (TV),TMA: NOT DETECTED

## 2021-05-19 ENCOUNTER — Telehealth: Payer: Self-pay

## 2021-05-19 ENCOUNTER — Other Ambulatory Visit: Payer: Self-pay

## 2021-05-19 DIAGNOSIS — B9689 Other specified bacterial agents as the cause of diseases classified elsewhere: Secondary | ICD-10-CM

## 2021-05-19 MED ORDER — METRONIDAZOLE 500 MG PO TABS: 500.0000 mg | ORAL_TABLET | Freq: Two times a day (BID) | ORAL | 0 refills | Status: AC

## 2021-05-19 NOTE — Telephone Encounter (Signed)
-----   Message from Blanchard Kelch, NP sent at 05/19/2021  4:02 PM EST ----- ?Please send in metronidazole 500 mg tabs - 1 tab BID x 7 days to pharmacy where she gets [employer].  ?Take with food to help nausea and avoid alcohol while taking.  ?I sent [facility] a message re: details of the test result.  ?TY ?

## 2021-05-19 NOTE — Telephone Encounter (Signed)
Left voicemail asking patient to return my call.   Mohsin Crum P Muhammadali Ries, CMA  

## 2021-05-19 NOTE — Telephone Encounter (Signed)
I spoke to the patient and relayed lab results. Metronidazole sent to the pharmacy for the patient. Patient advised to take with food and avoid alcohol while taking.  ?Kristin Hart T Cleophus Mendonsa ? ?

## 2021-05-23 LAB — PAP IG (IMAGE GUIDED)

## 2021-05-23 LAB — HPV MRNA, HIGH RISK, RFLX 16,18/45: HPV DNA High Risk: DETECTED — AB

## 2021-05-23 LAB — HPV TYPE 16 AND 18/45 RNA
HPV Type 16 RNA: NOT DETECTED
HPV Type 18/45 RNA: NOT DETECTED

## 2021-05-25 NOTE — Addendum Note (Signed)
Addended by: Blanchard Kelch on: 05/25/2021 01:20 PM ? ? Modules accepted: Orders ? ?

## 2021-05-30 ENCOUNTER — Other Ambulatory Visit: Payer: Self-pay | Admitting: Family Medicine

## 2021-05-30 DIAGNOSIS — J4521 Mild intermittent asthma with (acute) exacerbation: Secondary | ICD-10-CM

## 2021-05-31 ENCOUNTER — Other Ambulatory Visit: Payer: Self-pay | Admitting: Family

## 2021-06-01 NOTE — Telephone Encounter (Signed)
Okay to refill? 

## 2021-06-03 ENCOUNTER — Encounter (HOSPITAL_COMMUNITY): Payer: Self-pay | Admitting: Emergency Medicine

## 2021-06-03 ENCOUNTER — Emergency Department (HOSPITAL_COMMUNITY)
Admission: EM | Admit: 2021-06-03 | Discharge: 2021-06-04 | Disposition: A | Discharge status: Home / Self Care | Payer: Medicaid Other | Attending: Emergency Medicine | Admitting: Emergency Medicine

## 2021-06-03 ENCOUNTER — Other Ambulatory Visit: Payer: Self-pay

## 2021-06-03 DIAGNOSIS — L03116 Cellulitis of left lower limb: Secondary | ICD-10-CM | POA: Insufficient documentation

## 2021-06-03 DIAGNOSIS — Z21 Asymptomatic human immunodeficiency virus [HIV] infection status: Secondary | ICD-10-CM | POA: Insufficient documentation

## 2021-06-03 LAB — I-STAT BETA HCG BLOOD, ED (MC, WL, AP ONLY): I-stat hCG, quantitative: <5 m[IU]/mL (ref <5)

## 2021-06-03 LAB — CBC WITH DIFFERENTIAL/PLATELET
Abs Immature Granulocytes: 0.02 10*3/uL (ref 0.00–0.07)
Basophils Absolute: 0 10*3/uL (ref 0.0–0.1)
Basophils Relative: 1 %
Eosinophils Absolute: 0.7 10*3/uL — ABNORMAL HIGH (ref 0.0–0.5)
Eosinophils Relative: 9 %
HCT: 34.6 % — ABNORMAL LOW (ref 36.0–46.0)
Hemoglobin: 10.4 g/dL — ABNORMAL LOW (ref 12.0–15.0)
Immature Granulocytes: 0 %
Lymphocytes Relative: 16 %
Lymphs Abs: 1.4 10*3/uL (ref 0.7–4.0)
MCH: 24.4 pg — ABNORMAL LOW (ref 26.0–34.0)
MCHC: 30.1 g/dL (ref 30.0–36.0)
MCV: 81 fL (ref 80.0–100.0)
Monocytes Absolute: 0.7 10*3/uL (ref 0.1–1.0)
Monocytes Relative: 8 %
Neutro Abs: 5.7 10*3/uL (ref 1.7–7.7)
Neutrophils Relative %: 66 %
Platelets: 304 10*3/uL (ref 150–400)
RBC: 4.27 MIL/uL (ref 3.87–5.11)
RDW: 14.7 % (ref 11.5–15.5)
WBC: 8.5 10*3/uL (ref 4.0–10.5)
nRBC: 0 % (ref 0.0–0.2)

## 2021-06-03 LAB — BASIC METABOLIC PANEL
Anion gap: 7 (ref 5–15)
BUN: 13 mg/dL (ref 6–20)
CO2: 21 mmol/L — ABNORMAL LOW (ref 22–32)
Calcium: 8.9 mg/dL (ref 8.9–10.3)
Chloride: 106 mmol/L (ref 98–111)
Creatinine, Ser: 0.94 mg/dL (ref 0.44–1.00)
GFR, Estimated: >60 mL/min (ref >60)
Glucose, Bld: 335 mg/dL — ABNORMAL HIGH (ref 70–99)
Potassium: 3.1 mmol/L — ABNORMAL LOW (ref 3.5–5.1)
Sodium: 134 mmol/L — ABNORMAL LOW (ref 135–145)

## 2021-06-03 NOTE — ED Provider Triage Note (Signed)
Emergency Medicine Provider Triage Evaluation Note ? ?Kristin Hart , a 41 y.o. female  was evaluated in triage.  Pt complains of sore on her left leg.  She first noticed this yesterday and picked at it significantly.  She states that the pain now shoots down her entire foot and it hurts to bear weight.  She denies any fevers. ? ? ?Physical Exam  ?BP (!) 162/87 (BP Location: Left Arm)   Pulse (!) 106   Temp 98 ?F (36.7 ?C) (Oral)   Resp 18   Ht 5\' 8"  (1.727 m)   Wt 73.6 kg   SpO2 97%   BMI 24.67 kg/m?  ?Gen:   Awake, no distress   ?Resp:  Normal effort  ?MSK:   Moves extremities without difficulty  ?Other:  Approximately 2 cm wound over the left lateral lower leg. ? ?Medical Decision Making  ?Medically screening exam initiated at 11:19 PM.  Appropriate orders placed.  STEFANEE MCKELL was informed that the remainder of the evaluation will be completed by another provider, this initial triage assessment does not replace that evaluation, and the importance of remaining in the ED until their evaluation is complete. ? ?Patient has a history of HIV, in triage she is also tachycardic.  She is afebrile, however given tachycardia will check basic labs. ?  ?Kristin Beets, PA-C ?06/03/21 2322 ? ?

## 2021-06-03 NOTE — ED Triage Notes (Signed)
Pt having a sore on her left leg that is getting more painful and now her whole leg is on pain. ?

## 2021-06-04 MED ORDER — FLUCONAZOLE 150 MG PO TABS: 150.0000 mg | ORAL_TABLET | Freq: Once | ORAL | 0 refills | Status: DC | PRN

## 2021-06-04 MED ORDER — DOXYCYCLINE HYCLATE 100 MG PO CAPS: 100.0000 mg | ORAL_CAPSULE | Freq: Two times a day (BID) | ORAL | 0 refills | Status: DC

## 2021-06-04 MED ORDER — ONDANSETRON 4 MG PO TBDP: ORAL_TABLET | ORAL | 0 refills | Status: DC

## 2021-06-04 MED ORDER — DOXYCYCLINE HYCLATE 100 MG PO TABS
100.0000 mg | ORAL_TABLET | Freq: Once | ORAL | Status: AC
  Administered 2021-06-04: 100 mg via ORAL
  Filled 2021-06-04: qty 1

## 2021-06-04 NOTE — ED Provider Notes (Signed)
?[facility] EMERGENCY DEPARTMENT ?Provider Note ? ? ?CSN: [id number] ?Arrival date & time: 06/03/21  2252 ? ?  ? ?History ? ?Chief Complaint  ?Patient presents with  ? Wound Check  ? ? ?Kristin Hart is a 41 y.o. female. ? ?41 yo F with a chief complaints of a wound to the left leg.  Going on for a few days.  She had a scab there that she picked off.  Notices get a little bit more painful and warm to the touch.  No fevers at home. ? ? ?Wound Check ? ? ?  ? ?Home Medications ?Prior to Admission medications   ?Medication Sig Start Date End Date Taking? Authorizing Provider  ?doxycycline (VIBRAMYCIN) 100 MG capsule Take 1 capsule (100 mg total) by mouth 2 (two) times daily. One po bid x 7 days 06/04/21  Yes Melene Plan, DO  ?fluconazole (DIFLUCAN) 150 MG tablet Take 1 tablet (150 mg total) by mouth once as needed for up to 1 dose (yeast infection). 06/04/21  Yes Melene Plan, DO  ?ondansetron (ZOFRAN-ODT) 4 MG disintegrating tablet 4mg  ODT q4 hours prn nausea/vomit 06/04/21  Yes 06/06/21, DO  ?bictegravir-emtricitabine-tenofovir AF (BIKTARVY) 50-200-25 MG TABS tablet Take 1 tablet by mouth daily. 04/12/21   06/10/21, FNP  ?cetirizine (ZYRTEC) 10 MG tablet TAKE 1 TABLET BY MOUTH EVERY DAY 05/31/21   Lilland, Alana, DO  ?cyclobenzaprine (FLEXERIL) 5 MG tablet Take 1 tablet (5 mg total) by mouth at bedtime. 07/13/20   09/12/20, DO  ?diclofenac Sodium (VOLTAREN) 1 % GEL Apply 2 g topically 4 (four) times daily as needed (for elbow pain). As needed for hip pain. 04/13/20   Lilland, Alana, DO  ?furosemide (LASIX) 20 MG tablet Take 1 tablet (20 mg total) by mouth daily as needed for edema. 01/19/21   13/10/22, FNP  ?gabapentin (NEURONTIN) 100 MG capsule TAKE 1 CAPSULE BY MOUTH TWICE A DAY 09/27/20   Lilland, Alana, DO  ?hydrocortisone cream 1 % Apply to affected area 2 times daily ?Patient taking differently: Apply 1 application. topically 2 (two) times daily as needed for itching. 08/17/18    Henderly, Britni A, PA-C  ?ibuprofen (ADVIL) 600 MG tablet Take 1 tablet (600 mg total) by mouth every 6 (six) hours as needed. ?Patient taking differently: Take 600 mg by mouth every 6 (six) hours as needed for moderate pain. 10/22/20   10/24/20, PA-C  ?Menthol, Topical Analgesic, (ICY HOT EX) Apply 1 application topically 2 (two) times daily as needed (pain).    [provider]  ?naproxen (NAPROSYN) 500 MG tablet Take 1 tablet (500 mg total) by mouth daily as needed for moderate pain. Appointment must be made before another refill. 04/24/21   Lilland, Alana, DO  ?omeprazole (PRILOSEC) 40 MG capsule Take 1 capsule (40 mg total) by mouth daily. 01/19/21   13/10/22, FNP  ?topiramate (TOPAMAX) 25 MG tablet TAKE 1 TABLET (25 MG TOTAL) BY MOUTH DAILY. 04/07/21   Lilland, Alana, DO  ?valACYclovir (VALTREX) 1000 MG tablet Take 1 tablet (1,000 mg total) by mouth daily. 05/17/21   07/17/21, NP  ?VENTOLIN HFA 108 (90 Base) MCG/ACT inhaler INHALE 1 PUFF INTO THE LUNGS EVERY 4 HOURS AS NEEDED. 05/31/21   06/02/21, DO  ?   ? ?Allergies    ?Patient has no known allergies.   ? ?Review of Systems   ?Review of Systems ? ?Physical Exam ?Updated Vital Signs ?BP (!) 150/91 (  BP Location: Right Arm)   Pulse 89   Temp 98 ?F (36.7 ?C) (Oral)   Resp 15   Ht 5\' 8"  (1.727 m)   Wt 73.6 kg   SpO2 100%   BMI 24.67 kg/m?  ?Physical Exam ?Vitals and nursing note reviewed.  ?Constitutional:   ?   General: She is not in acute distress. ?   Appearance: She is well-developed. She is not diaphoretic.  ?HENT:  ?   Head: Normocephalic and atraumatic.  ?Eyes:  ?   Pupils: Pupils are equal, round, and reactive to light.  ?Cardiovascular:  ?   Rate and Rhythm: Normal rate and regular rhythm.  ?   Heart sounds: No murmur heard. ?  No friction rub. No gallop.  ?Pulmonary:  ?   Effort: Pulmonary effort is normal.  ?   Breath sounds: No wheezing or rales.  ?Abdominal:  ?   General: There is no distension.  ?    Palpations: Abdomen is soft.  ?   Tenderness: There is no abdominal tenderness.  ?Musculoskeletal:     ?   General: Swelling and tenderness present.  ?   Cervical back: Normal range of motion and neck supple.  ?   Comments: Pain and swelling about the left lateral aspect of the lower leg.  No fluctuance or induration.  Tender to palpation.  Pulse motor and sensation intact distally.  Mildly warm when compared to the other side.  ?Skin: ?   General: Skin is warm and dry.  ?Neurological:  ?   Mental Status: She is alert and oriented to person, place, and time.  ?Psychiatric:     ?   Behavior: Behavior normal.  ? ? ?ED Results / Procedures / Treatments   ?Labs ?(all labs ordered are listed, but only abnormal results are displayed) ?Labs Reviewed  ?CBC WITH DIFFERENTIAL/PLATELET - Abnormal; Notable for the following components:  ?    Result Value  ? Hemoglobin 10.4 (*)   ? HCT 34.6 (*)   ? MCH 24.4 (*)   ? Eosinophils Absolute 0.7 (*)   ? All other components within normal limits  ?BASIC METABOLIC PANEL - Abnormal; Notable for the following components:  ? Sodium 134 (*)   ? Potassium 3.1 (*)   ? CO2 21 (*)   ? Glucose, Bld 335 (*)   ? All other components within normal limits  ?I-STAT BETA HCG BLOOD, ED ([facility], [facility], [facility] ONLY)  ? ? ?EKG ?None ? ?Radiology ?No results found. ? ?Procedures ?Procedures  ? ? ?Medications Ordered in ED ?Medications  ?doxycycline (VIBRA-TABS) tablet 100 mg (100 mg Oral Given 06/04/21 06/06/21)  ? ? ?ED Course/ Medical Decision Making/ A&P ?  ?                        ?Medical Decision Making ?Risk ?Prescription drug management. ? ? ?41 yo F with a chief complaints of cellulitis.  Going on for a few days now.  She had a scab that she had picked off prior to this.  Will start on oral antibiotics with her history of HIV.  Have her follow-up with infectious disease. ? ?6:36 AM:  I have discussed the diagnosis/risks/treatment options with the patient.  Evaluation and diagnostic testing in the emergency  department does not suggest an emergent condition requiring admission or immediate intervention beyond what has been performed at this time.  They will follow up with  PCP. We also discussed returning to  the ED immediately if new or worsening sx occur. We discussed the sx which are most concerning (e.g., sudden worsening pain, fever, inability to tolerate by mouth) that necessitate immediate return. Medications administered to the patient during their visit and any new prescriptions provided to the patient are listed below. ? ?Medications given during this visit ?Medications  ?doxycycline (VIBRA-TABS) tablet 100 mg (100 mg Oral Given 06/04/21 [id number])  ? ? ? ?The patient appears reasonably screen and/or stabilized for discharge and I doubt any other medical condition or other [facility] requiring further screening, evaluation, or treatment in the ED at this time prior to discharge.  ? ? ? ? ? ? ? ? ?Final Clinical Impression(s) / ED Diagnoses ?Final diagnoses:  ?Cellulitis of left lower extremity  ? ? ?Rx / DC Orders ?ED Discharge Orders   ? ?      Ordered  ?  doxycycline (VIBRAMYCIN) 100 MG capsule  2 times daily       ? 06/04/21 0624  ?  ondansetron (ZOFRAN-ODT) 4 MG disintegrating tablet       ? 06/04/21 0624  ?  fluconazole (DIFLUCAN) 150 MG tablet  Once PRN       ? 06/04/21 [id number]  ? ?  ?  ? ?  ? ? ?  ?Melene PlanFloyd, Keylie Beavers, DO ?06/04/21 0636 ? ?

## 2021-06-04 NOTE — ED Notes (Signed)
E-signature pad unavailable at time of pt discharge. This RN discussed discharge materials with pt and answered all pt questions. Pt stated understanding of discharge material. ? ?

## 2021-06-04 NOTE — ED Notes (Signed)
RN covered pt's wound with non-adhesive pad and paper tape. Provided with sandwich bag, soda, ice water, and bus pass.  ?

## 2021-06-04 NOTE — Discharge Instructions (Signed)
Return for rapid spreading redness or if you develop a fever. ?

## 2021-06-05 ENCOUNTER — Telehealth: Payer: Self-pay

## 2021-06-05 NOTE — Telephone Encounter (Signed)
Transition Care Management Follow-up Telephone Call ?Date of discharge and from where: 06/04/2021-[facility] [place] [facility]  ?How have you been since you were released from the hospital? Pt stated she is still in pain but has her medication and has an appointment scheduled with Inf Disease.  ?Any questions or concerns? Yes ?Patient wondered why her wound didn't get clean when she was at the ED.  ?Items Reviewed: ?Did the pt receive and understand the discharge instructions provided? Yes  ?Medications obtained and verified? Yes  ?Other? No  ?Any new allergies since your discharge? No  ?Dietary orders reviewed? No ?Do you have support at home? Yes  ? ?Home Care and Equipment/Supplies: ?Were home health services ordered? not applicable ?If so, what is the name of the agency? N/A  ?Has the agency set up a time to come to the patient's home? not applicable ?Were any new equipment or medical supplies ordered?  No ?What is the name of the medical supply agency? N/A ?Were you able to get the supplies/equipment? not applicable ?Do you have any questions related to the use of the equipment or supplies? No ? ?Functional Questionnaire: (I = Independent and D = Dependent) ?ADLs: I ? ?Bathing/Dressing- I ? ?Meal Prep- I ? ?Eating- I ? ?Maintaining continence- I ? ?Transferring/Ambulation- I ? ?Managing Meds- I ? ?Follow up appointments reviewed: ? ?PCP Hospital f/u appt confirmed? No   ?Specialist Hospital f/u appt confirmed? Yes  Scheduled to see Inf Disease on 06/08/2021. ?Are transportation arrangements needed? No  ?If their condition worsens, is the pt aware to call PCP or go to the Emergency Dept.? Yes ?Was the patient provided with contact information for the PCP's office or ED? Yes ?Was to pt encouraged to call back with questions or concerns? Yes  ?

## 2021-06-08 ENCOUNTER — Ambulatory Visit: Payer: Medicaid Other | Admitting: Family

## 2021-06-09 ENCOUNTER — Other Ambulatory Visit: Payer: Self-pay | Admitting: Family

## 2021-06-12 ENCOUNTER — Encounter: Payer: Self-pay | Admitting: Family

## 2021-06-12 ENCOUNTER — Other Ambulatory Visit: Payer: Self-pay

## 2021-06-12 ENCOUNTER — Ambulatory Visit (INDEPENDENT_AMBULATORY_CARE_PROVIDER_SITE_OTHER): Payer: Medicaid Other | Admitting: Family

## 2021-06-12 VITALS — BP 150/87 | HR 89 | Temp 98.2°F | Ht 67.0 in | Wt 161.0 lb

## 2021-06-12 DIAGNOSIS — Z599 Problem related to housing and economic circumstances, unspecified: Secondary | ICD-10-CM

## 2021-06-12 DIAGNOSIS — F1520 Other stimulant dependence, uncomplicated: Secondary | ICD-10-CM | POA: Diagnosis not present

## 2021-06-12 DIAGNOSIS — R030 Elevated blood-pressure reading, without diagnosis of hypertension: Secondary | ICD-10-CM | POA: Diagnosis not present

## 2021-06-12 DIAGNOSIS — Z21 Asymptomatic human immunodeficiency virus [HIV] infection status: Secondary | ICD-10-CM

## 2021-06-12 MED ORDER — FUROSEMIDE 20 MG PO TABS: 20.0000 mg | ORAL_TABLET | Freq: Every day | ORAL | 0 refills | Status: DC | PRN

## 2021-06-12 NOTE — Assessment & Plan Note (Signed)
Currently staying with her mother-in-law which could change at any time. Groceries and toiletries provided. She has lists of available resources. Will continue to monitor.  ?

## 2021-06-12 NOTE — Assessment & Plan Note (Signed)
Ms. Yepez continues to have well controlled virus with good adherence and tolerance to her ART regimen of Biktarvy. Reviewed lab work and discussed plan of care. Will check lab work at next office visit as it will have been 6 months from last blood work. Continue current dose of Biktarvy. Plan for follow up in 1 month or sooner if needed.  ?

## 2021-06-12 NOTE — Assessment & Plan Note (Signed)
Blood pressure elevated with concern for methamphetamine as the cause. Will continue to monitor. Currently using furosemide as needed for edema. If blood pressure remains elevated may need to consider long term medication.  ?

## 2021-06-12 NOTE — Assessment & Plan Note (Signed)
Kristin Hart continues to use methamphetamine with last use just prior to office visit today. Encouraged to reduce and quit use. She is in the pre-contemplation stage and not ready to quit. ?

## 2021-06-12 NOTE — Patient Instructions (Addendum)
Nice to see you.  Continue to take your medication daily as prescribed.  Refills have been sent to the pharmacy.  Plan for follow up in 1 months or sooner if needed with lab work on the same day.  Have a great day and stay safe!  

## 2021-06-12 NOTE — Telephone Encounter (Signed)
Please advise on refill.

## 2021-06-12 NOTE — Progress Notes (Signed)
? ? ?Brief Narrative  ? ?Patient ID: Kristin Hart, female    DOB: 1980-10-08, 41 y.o.   MRN: [id number] ? ?Kristin Hart is a 41 y/o caucasian female diagnosed with HIV disease on 01/02/19 with risk factor of heterosexual contact and IV drug use. Initial viral load on 06/23/19 23,300 and CD4 count 577. Genotype with no significant drug resistance. Entered care at [facility] Stage 1. [id number] negative. No history of opportunistic infection. Sole medication regimen of Biktarvy ? ?Subjective:  ?  ?Chief Complaint  ?Patient presents with  ? Follow-up  ? ? ?HPI: ? ?Kristin Hart is a 41 y.o. female with HIV disease last seen on 05/10/21 with well controlled virus and good adherence and tolerance to her ART regimen of Biktarvy. Previous viral load undetectable with CD4 count of 1,150. Has completed cervical cancer screening and has been seen in the ED for cellulitis. Here today for routine follow up. ? ?Kristin Hart continues to take her Biktarvy daily as prescribed with no adverse side effects. She is also taking the doxycycline that was prescribed in the ED and has noted slow improvements in her leg. Having a headache today with general malaise. Denies fevers, chills, night sweats, changes in vision, neck pain/stiffness, nausea, diarrhea, vomiting, lesions or rashes. ? ?Kristin Hart has no problems obtaining medication from the pharmacy. Denies feelings of being down, depressed or hopeless. 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 She has been staying with her mother-in-law for the past 3 weeks and helping out where she can. Has also placed a lock on her room. Condoms offered.  ? ?No Known Allergies ? ? ? ?Outpatient Medications Prior to Visit  ?Medication Sig Dispense Refill  ? bictegravir-emtricitabine-tenofovir AF (BIKTARVY) 50-200-25 MG TABS tablet Take 1 tablet by mouth daily. 30 tablet 5  ? cetirizine (ZYRTEC) 10 MG tablet TAKE 1 TABLET BY MOUTH EVERY DAY 30 tablet 11  ?  cyclobenzaprine (FLEXERIL) 5 MG tablet Take 1 tablet (5 mg total) by mouth at bedtime. 10 tablet 0  ? diclofenac Sodium (VOLTAREN) 1 % GEL Apply 2 g topically 4 (four) times daily as needed (for elbow pain). As needed for hip pain. 2 g 3  ? doxycycline (VIBRAMYCIN) 100 MG capsule Take 1 capsule (100 mg total) by mouth 2 (two) times daily. One po bid x 7 days 14 capsule 0  ? fluconazole (DIFLUCAN) 150 MG tablet Take 1 tablet (150 mg total) by mouth once as needed for up to 1 dose (yeast infection). 1 tablet 0  ? gabapentin (NEURONTIN) 100 MG capsule TAKE 1 CAPSULE BY MOUTH TWICE A DAY 30 capsule 0  ? hydrocortisone cream 1 % Apply to affected area 2 times daily (Patient taking differently: Apply 1 application. topically 2 (two) times daily as needed for itching.) 15 g 0  ? ibuprofen (ADVIL) 600 MG tablet Take 1 tablet (600 mg total) by mouth every 6 (six) hours as needed. (Patient taking differently: Take 600 mg by mouth every 6 (six) hours as needed for moderate pain.) 30 tablet 0  ? Menthol, Topical Analgesic, (ICY HOT EX) Apply 1 application topically 2 (two) times daily as needed (pain).    ? naproxen (NAPROSYN) 500 MG tablet Take 1 tablet (500 mg total) by mouth daily as needed for moderate pain. Appointment must be made before another refill. 20 tablet 0  ? omeprazole (PRILOSEC) 40 MG capsule Take 1 capsule (40 mg total) by mouth daily. 30 capsule 3  ?  ondansetron (ZOFRAN-ODT) 4 MG disintegrating tablet 4mg  ODT q4 hours prn nausea/vomit 20 tablet 0  ? topiramate (TOPAMAX) 25 MG tablet TAKE 1 TABLET (25 MG TOTAL) BY MOUTH DAILY. 30 tablet 1  ? valACYclovir (VALTREX) 1000 MG tablet TAKE 1 TABLET BY MOUTH TWICE A DAY 14 tablet 0  ? VENTOLIN HFA 108 (90 Base) MCG/ACT inhaler INHALE 1 PUFF INTO THE LUNGS EVERY 4 HOURS AS NEEDED. 18 each 2  ? furosemide (LASIX) 20 MG tablet Take 1 tablet (20 mg total) by mouth daily as needed for edema. 20 tablet 0  ? ?No facility-administered medications prior to visit.  ? ? ? ?Past  Medical History:  ?Diagnosis Date  ? Asthma   ? Borderline personality disorder (HCC)   ? with schizophrenic tendancies, per pt  ? Diabetes mellitus without complication (HCC)   ? type 2  ? Foreign body in small intestine   ? HIV (human immunodeficiency virus infection) (HCC)   ? Hypothyroidism   ? Stomach ulcer   ? ? ? ?Past Surgical History:  ?Procedure Laterality Date  ? CESAREAN SECTION N/A 07/04/2016  ? Procedure: CESAREAN SECTION;  Surgeon: 07/06/2016, DO;  Location: [facility] BIRTHING SUITES;  Service: Obstetrics;  Laterality: N/A;  ? ENTEROSCOPY N/A 11/08/2017  ? Procedure: ENTEROSCOPY;  Surgeon: 11/10/2017, MD;  Location: [facility] ENDOSCOPY;  Service: Endoscopy;  Laterality: N/A;  ? NO PAST SURGERIES    ? ? ? ? ?Review of Systems  ?Constitutional:  Negative for appetite change, chills, diaphoresis, fatigue, fever and unexpected weight change.  ?Eyes:   ?     Negative for acute change in vision  ?Respiratory:  Negative for chest tightness, shortness of breath and wheezing.   ?Cardiovascular:  Negative for chest pain.  ?Gastrointestinal:  Negative for diarrhea, nausea and vomiting.  ?Genitourinary:  Negative for dysuria, pelvic pain and vaginal discharge.  ?Musculoskeletal:  Negative for neck pain and neck stiffness.  ?Skin:  Negative for rash.  ?Neurological:  Negative for seizures, syncope, weakness and headaches.  ?Hematological:  Negative for adenopathy. Does not bruise/bleed easily.  ?Psychiatric/Behavioral:  Negative for hallucinations.   ?   ?Objective:  ?  ?BP (!) 150/87   Pulse 89   Temp 98.2 ?F (36.8 ?C) (Oral)   Ht 5\' 7"  (1.702 m)   Wt 161 lb (73 kg)   SpO2 97%   BMI 25.22 kg/m?  ?Nursing note and vital signs reviewed. ? ?Physical Exam ?Constitutional:   ?   General: She is not in acute distress. ?   Appearance: She is well-developed.  ?Eyes:  ?   Conjunctiva/sclera: Conjunctivae normal.  ?Cardiovascular:  ?   Rate and Rhythm: Normal rate and regular rhythm.  ?   Heart sounds: Normal heart sounds.  No murmur heard. ?  No friction rub. No gallop.  ?Pulmonary:  ?   Effort: Pulmonary effort is normal. No respiratory distress.  ?   Breath sounds: Normal breath sounds. No wheezing or rales.  ?Chest:  ?   Chest wall: No tenderness.  ?Abdominal:  ?   General: Bowel sounds are normal.  ?   Palpations: Abdomen is soft.  ?   Tenderness: There is no abdominal tenderness.  ?Musculoskeletal:  ?   Cervical back: Neck supple.  ?Lymphadenopathy:  ?   Cervical: No cervical adenopathy.  ?Skin: ?   General: Skin is warm and dry.  ?   Findings: No rash.  ?Neurological:  ?   Mental Status: She is alert and  oriented to person, place, and time.  ?Psychiatric:     ?   Behavior: Behavior normal.     ?   Thought Content: Thought content normal.     ?   Judgment: Judgment normal.  ? ? ? ? ?  06/12/2021  ?  3:46 PM 04/12/2021  ?  2:58 PM 02/16/2021  ?  3:39 PM 12/02/2020  ?  2:20 PM 07/29/2020  ? 11:27 AM  ?Depression screen PHQ 2/9  ?Decreased Interest 0 0 0 0 0  ?Down, Depressed, Hopeless 0 2 1 1  0  ?PHQ - 2 Score 0 2 1 1  0  ?Altered sleeping  3  1   ?Tired, decreased energy  2  1   ?Change in appetite  2  0   ?Feeling bad or failure about yourself   1  1   ?Trouble concentrating  1  1   ?Moving slowly or fidgety/restless  0  0   ?Suicidal thoughts  0  0   ?PHQ-9 Score  11  5   ?Difficult doing work/chores    Somewhat difficult   ?  ?   ?Assessment & Plan:  ? ? ?Patient Active Problem List  ? Diagnosis Date Noted  ? Birth control counseling 12/20/2020  ? Open wnd of finger 12/20/2020  ? Elevated BP without diagnosis of hypertension 09/21/2020  ? Grief 08/29/2020  ? Encounter for initial prescription of injectable contraceptive 07/29/2020  ? Left hip pain 07/17/2020  ? 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 Screening for cervical cancer 04/21/2020  ? Screening for STDs (sexually transmitted diseases) 04/21/2020  ? Costochondritis 02/19/2020  ? Bilateral hip pain 02/19/2020  ? Elbow pain, right 12/04/2019  ? Housing  problems 11/23/2019  ? Mental health disorder 10/12/2019  ? 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 Paresthesias 10/12/2019  ? Healthcare maintenance 08/05/2019  ? HIV (human immunodeficiency virus infection) (HCC) 10/21/202

## 2021-06-23 ENCOUNTER — Telehealth: Payer: Self-pay

## 2021-06-23 NOTE — Telephone Encounter (Signed)
Patient made aware of referral and provided contact information for [facility]. ? ?Eugenia Mcalpine ? ?

## 2021-06-23 NOTE — Telephone Encounter (Signed)
-----   Message from Blanchard Kelch, NP sent at 05/25/2021  1:18 PM EDT ----- ?Please call Kristin Hart to let her know that her pap smear test has some abnormalities seen with the cells and she has HPV virus detected.  ? ?We need to get her referred to [name] for colposcopy (a better test that looks at the cells on the cervix better and deeper to make sure these are not concerning changes we need to worry about now).  ?They are a great clinic and also have access to a food pantry for their patients too.  ?

## 2021-06-29 ENCOUNTER — Other Ambulatory Visit: Payer: Self-pay | Admitting: Family

## 2021-06-29 ENCOUNTER — Other Ambulatory Visit: Payer: Self-pay | Admitting: Family Medicine

## 2021-06-29 DIAGNOSIS — G43009 Migraine without aura, not intractable, without status migrainosus: Secondary | ICD-10-CM

## 2021-07-07 ENCOUNTER — Other Ambulatory Visit: Payer: Self-pay | Admitting: Family

## 2021-07-07 ENCOUNTER — Other Ambulatory Visit: Payer: Self-pay | Admitting: Family Medicine

## 2021-07-07 DIAGNOSIS — M25551 Pain in right hip: Secondary | ICD-10-CM

## 2021-07-17 ENCOUNTER — Ambulatory Visit: Payer: Medicaid Other | Admitting: Family Medicine

## 2021-07-18 ENCOUNTER — Other Ambulatory Visit: Payer: Self-pay | Admitting: Family Medicine

## 2021-07-18 DIAGNOSIS — M25551 Pain in right hip: Secondary | ICD-10-CM

## 2021-07-25 ENCOUNTER — Encounter: Payer: Self-pay | Admitting: Family

## 2021-07-25 ENCOUNTER — Other Ambulatory Visit: Payer: Self-pay

## 2021-07-25 ENCOUNTER — Ambulatory Visit (INDEPENDENT_AMBULATORY_CARE_PROVIDER_SITE_OTHER): Payer: Medicaid Other | Admitting: Family

## 2021-07-25 VITALS — BP 155/95 | HR 115 | Temp 98.0°F | Wt 166.0 lb

## 2021-07-25 DIAGNOSIS — Z3009 Encounter for other general counseling and advice on contraception: Secondary | ICD-10-CM

## 2021-07-25 DIAGNOSIS — Z30013 Encounter for initial prescription of injectable contraceptive: Secondary | ICD-10-CM | POA: Diagnosis not present

## 2021-07-25 DIAGNOSIS — Z21 Asymptomatic human immunodeficiency virus [HIV] infection status: Secondary | ICD-10-CM | POA: Diagnosis present

## 2021-07-25 DIAGNOSIS — F1520 Other stimulant dependence, uncomplicated: Secondary | ICD-10-CM

## 2021-07-25 DIAGNOSIS — Z599 Problem related to housing and economic circumstances, unspecified: Secondary | ICD-10-CM

## 2021-07-25 DIAGNOSIS — M25552 Pain in left hip: Secondary | ICD-10-CM

## 2021-07-25 DIAGNOSIS — Z0000 Encounter for general adult medical examination without abnormal findings: Secondary | ICD-10-CM | POA: Diagnosis not present

## 2021-07-25 DIAGNOSIS — M25551 Pain in right hip: Secondary | ICD-10-CM | POA: Diagnosis not present

## 2021-07-25 LAB — POCT URINE PREGNANCY: Preg Test, Ur: NEGATIVE

## 2021-07-25 MED ORDER — BICTEGRAVIR-EMTRICITAB-TENOFOV 50-200-25 MG PO TABS
1.0000 | ORAL_TABLET | Freq: Every day | ORAL | 5 refills | Status: DC
Start: 2021-07-25 — End: 2021-10-24

## 2021-07-25 MED ORDER — NAPROXEN 500 MG PO TABS: 500.0000 mg | ORAL_TABLET | Freq: Every day | ORAL | 0 refills | Status: DC | PRN

## 2021-07-25 MED ORDER — MEDROXYPROGESTERONE ACETATE 150 MG/ML IM SUSP
150.0000 mg | Freq: Once | INTRAMUSCULAR | Status: AC
  Administered 2021-07-25: 150 mg via INTRAMUSCULAR

## 2021-07-25 NOTE — Assessment & Plan Note (Signed)
Kristin Hart continues to have issues with housing now complicated by loss of back up housing option. Will connect with [facility] to discuss available resources.  ?

## 2021-07-25 NOTE — Progress Notes (Signed)
? ? ?Brief Narrative  ? ?Patient ID: Kristin Hart, female    DOB: 1980-11-11, 41 y.o.   MRN: [id number] ? ?Ms. Kristin Hart is a 41 y/o caucasian female diagnosed with HIV disease on 01/02/19 with risk factor of heterosexual contact and IV drug use. Initial viral load on 06/23/19 23,300 and CD4 count 577. Genotype with no significant drug resistance. [facility] Stage [facility]. [id number] negative. No history of opportunistic infection. Sole medication regimen of Biktarvy ? ?Subjective:  ?  ?Chief Complaint  ?Patient presents with  ? Follow-up  ?  B20   ? ? ?HPI: ? ?Kristin Hart is a 41 y.o. female with HIV disease and last seen on 06/12/2021 with well-controlled virus and good adherence and tolerance to her ART regimen of Biktarvy.  Most recent lab work with viral load that was undetectable and CD4 count of 1150. Here today for follow up.  ? ?Ms. Kristin Hart continues to take her Biktarvy daily as prescribed with no adverse side effects.  Feeling okay today.  Has concerns about housing as her backup housing option will no longer be available.  With summer coming she will have limited time at her current housing source. Denies fevers, chills, night sweats, headaches, changes in vision, neck pain/stiffness, nausea, diarrhea, vomiting, lesions or rashes. ? ?Ms. Kristin Hart has no problems obtaining medication from the pharmacy.  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  Condoms provided.  Pregnancy test negative and requesting Depo-Provera. ? ?No Known Allergies ? ? ? ?Outpatient Medications Prior to Visit  ?Medication Sig Dispense Refill  ? cetirizine (ZYRTEC) 10 MG tablet TAKE 1 TABLET BY MOUTH EVERY DAY 30 tablet 11  ? cyclobenzaprine (FLEXERIL) 5 MG tablet Take 1 tablet (5 mg total) by mouth at bedtime. 10 tablet 0  ? diclofenac Sodium (VOLTAREN) 1 % GEL Apply 2 g topically 4 (four) times daily as needed (for elbow pain). As needed for hip pain. 2 g 3  ? furosemide (LASIX) 20 MG tablet  TAKE 1 TABLET (20 MG TOTAL) BY MOUTH DAILY AS NEEDED FOR EDEMA. 20 tablet 0  ? gabapentin (NEURONTIN) 100 MG capsule TAKE 1 CAPSULE BY MOUTH TWICE A DAY 30 capsule 0  ? hydrocortisone cream 1 % Apply to affected area 2 times daily (Patient taking differently: Apply 1 application. topically 2 (two) times daily as needed for itching.) 15 g 0  ? ibuprofen (ADVIL) 600 MG tablet Take 1 tablet (600 mg total) by mouth every 6 (six) hours as needed. (Patient taking differently: Take 600 mg by mouth every 6 (six) hours as needed for moderate pain.) 30 tablet 0  ? Menthol, Topical Analgesic, (ICY HOT EX) Apply 1 application topically 2 (two) times daily as needed (pain).    ? omeprazole (PRILOSEC) 40 MG capsule Take 1 capsule (40 mg total) by mouth daily. 30 capsule 3  ? ondansetron (ZOFRAN-ODT) 4 MG disintegrating tablet 4mg  ODT q4 hours prn nausea/vomit 20 tablet 0  ? topiramate (TOPAMAX) 25 MG tablet TAKE 1 TABLET (25 MG TOTAL) BY MOUTH DAILY. 30 tablet 1  ? valACYclovir (VALTREX) 1000 MG tablet TAKE 1 TABLET BY MOUTH TWICE A DAY 14 tablet 0  ? VENTOLIN HFA 108 (90 Base) MCG/ACT inhaler INHALE 1 PUFF INTO THE LUNGS EVERY 4 HOURS AS NEEDED. 18 each 2  ? bictegravir-emtricitabine-tenofovir AF (BIKTARVY) 50-200-25 MG TABS tablet Take 1 tablet by mouth daily. 30 tablet 5  ? doxycycline (VIBRAMYCIN) 100 MG capsule Take 1 capsule (  100 mg total) by mouth 2 (two) times daily. One po bid x 7 days 14 capsule 0  ? fluconazole (DIFLUCAN) 150 MG tablet Take 1 tablet (150 mg total) by mouth once as needed for up to 1 dose (yeast infection). 1 tablet 0  ? naproxen (NAPROSYN) 500 MG tablet Take 1 tablet (500 mg total) by mouth daily as needed for moderate pain. Appointment must be made before another refill. 20 tablet 0  ? ?No facility-administered medications prior to visit.  ? ? ? ?Past Medical History:  ?Diagnosis Date  ? Asthma   ? Borderline personality disorder (HCC)   ? with schizophrenic tendancies, per pt  ? Diabetes mellitus  without complication (HCC)   ? type 2  ? Foreign body in small intestine   ? HIV (human immunodeficiency virus infection) (HCC)   ? Hypothyroidism   ? Stomach ulcer   ? ? ? ?Past Surgical History:  ?Procedure Laterality Date  ? CESAREAN SECTION N/A 07/04/2016  ? Procedure: CESAREAN SECTION;  Surgeon: [name], DO;  Location: [facility] BIRTHING SUITES;  Service: Obstetrics;  Laterality: N/A;  ? ENTEROSCOPY N/A 11/08/2017  ? Procedure: ENTEROSCOPY;  Surgeon: Hilarie Fredrickson, MD;  Location: [facility] ENDOSCOPY;  Service: Endoscopy;  Laterality: N/A;  ? NO PAST SURGERIES    ? ? ? ? ?Review of Systems  ?Constitutional:  Negative for appetite change, chills, diaphoresis, fatigue, fever and unexpected weight change.  ?Eyes:   ?     Negative for acute change in vision  ?Respiratory:  Negative for chest tightness, shortness of breath and wheezing.   ?Cardiovascular:  Negative for chest pain.  ?Gastrointestinal:  Negative for diarrhea, nausea and vomiting.  ?Genitourinary:  Negative for dysuria, pelvic pain and vaginal discharge.  ?Musculoskeletal:  Negative for neck pain and neck stiffness.  ?Skin:  Negative for rash.  ?Neurological:  Negative for seizures, syncope, weakness and headaches.  ?Hematological:  Negative for adenopathy. Does not bruise/bleed easily.  ?Psychiatric/Behavioral:  Negative for hallucinations.   ?   ?Objective:  ?  ?BP (!) 155/95   Pulse (!) 115   Temp 98 ?F (36.7 ?C) (Oral)   Wt 166 lb (75.3 kg)   SpO2 100%   BMI 26.00 kg/m?  ?Nursing note and vital signs reviewed. ? ?Physical Exam ?Constitutional:   ?   General: She is not in acute distress. ?   Appearance: She is well-developed.  ?Eyes:  ?   Conjunctiva/sclera: Conjunctivae normal.  ?Cardiovascular:  ?   Rate and Rhythm: Normal rate and regular rhythm.  ?   Heart sounds: Normal heart sounds. No murmur heard. ?  No friction rub. No gallop.  ?Pulmonary:  ?   Effort: Pulmonary effort is normal. No respiratory distress.  ?   Breath sounds: Normal breath  sounds. No wheezing or rales.  ?Chest:  ?   Chest wall: No tenderness.  ?Abdominal:  ?   General: Bowel sounds are normal.  ?   Palpations: Abdomen is soft.  ?   Tenderness: There is no abdominal tenderness.  ?Musculoskeletal:  ?   Cervical back: Neck supple.  ?Lymphadenopathy:  ?   Cervical: No cervical adenopathy.  ?Skin: ?   General: Skin is warm and dry.  ?   Findings: No rash.  ?Neurological:  ?   Mental Status: She is alert and oriented to person, place, and time.  ?Psychiatric:     ?   Behavior: Behavior normal.     ?   Thought Content:  Thought content normal.     ?   Judgment: Judgment normal.  ? ? ? ? ?  06/12/2021  ?  3:46 PM 04/12/2021  ?  2:58 PM 02/16/2021  ?  3:39 PM 12/02/2020  ?  2:20 PM 07/29/2020  ? 11:27 AM  ?Depression screen PHQ 2/9  ?Decreased Interest 0 0 0 0 0  ?Down, Depressed, Hopeless 0 2 1 1  0  ?PHQ - 2 Score 0 2 1 1  0  ?Altered sleeping  3  1   ?Tired, decreased energy  2  1   ?Change in appetite  2  0   ?Feeling bad or failure about yourself   1  1   ?Trouble concentrating  1  1   ?Moving slowly or fidgety/restless  0  0   ?Suicidal thoughts  0  0   ?PHQ-9 Score  11  5   ?Difficult doing work/chores    Somewhat difficult   ?  ?   ?Assessment & Plan:  ? ? ?Patient Active Problem List  ? Diagnosis Date Noted  ? Birth control counseling 12/20/2020  ? Open wnd of finger 12/20/2020  ? Elevated BP without diagnosis of hypertension 09/21/2020  ? Grief 08/29/2020  ? Encounter for initial prescription of injectable contraceptive 07/29/2020  ? Left hip pain 07/17/2020  ? 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 Screening for cervical cancer 04/21/2020  ? Screening for STDs (sexually transmitted diseases) 04/21/2020  ? Costochondritis 02/19/2020  ? Bilateral hip pain 02/19/2020  ? Elbow pain, right 12/04/2019  ? Housing problems 11/23/2019  ? Mental health disorder 10/12/2019  ? 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 Paresthesias 10/12/2019  ? Healthcare maintenance 08/05/2019  ?  HIV (human immunodeficiency virus infection) (HCC) 12/31/2018  ? Anxiety 05/29/2017  ? Self-mutilation 05/29/2017  ? Depression 06/07/2016  ? Marijuana use 06/07/2016  ? Low vitamin D level 03/15/2016  ? Prediabetes

## 2021-07-25 NOTE — Assessment & Plan Note (Signed)
?   Discussed importance of safe sexual practice and condom use. Condoms offered.  ?? Continues to work with dentistry ?

## 2021-07-25 NOTE — Assessment & Plan Note (Signed)
Discussed risks and benefits of birth control with Depo-Provera. Pregnancy test negative. Injection of Depo-Provera administered without complication.  ?

## 2021-07-25 NOTE — Assessment & Plan Note (Signed)
Kristin Hart continues to have well controlled virus with good adherence and tolerance to Biktarvy. Discussed plan of care and will obtain lab work at next office visit. Continue current dose of Biktarvy. Plan for follow up in 1 month or sooner if needed.  ?

## 2021-07-25 NOTE — Assessment & Plan Note (Signed)
Left hip pain waxes and wanes. Stable with occasional naproxen. Discussed need to monitor renal function with NSAIDs and tenofovir in [place]. No complications at present. Continue naproxen as needed with ice/heat for symptom relief and supportive care.  ?

## 2021-07-25 NOTE — Assessment & Plan Note (Signed)
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 She is in the pre-contemplation stage and no intentions to quit at the present time. Discussed benefits of treatment/rehabilitation.  ?

## 2021-07-25 NOTE — Patient Instructions (Addendum)
Nice to see you.  Continue to take your medication daily as prescribed.  Refills have been sent to the pharmacy.  Plan for follow up in 1 months or sooner if needed with lab work on the same day.  Have a great day and stay safe!  

## 2021-07-27 ENCOUNTER — Other Ambulatory Visit: Payer: Self-pay | Admitting: Family Medicine

## 2021-07-27 DIAGNOSIS — G43009 Migraine without aura, not intractable, without status migrainosus: Secondary | ICD-10-CM

## 2021-08-02 ENCOUNTER — Other Ambulatory Visit: Payer: Self-pay | Admitting: Family

## 2021-08-02 NOTE — Telephone Encounter (Signed)
Okay to refill #14 tablets? Wasn't sure if her Tx is suppressive or episodic, thanks!

## 2021-08-10 ENCOUNTER — Other Ambulatory Visit: Payer: Self-pay | Admitting: Family Medicine

## 2021-08-10 DIAGNOSIS — G43009 Migraine without aura, not intractable, without status migrainosus: Secondary | ICD-10-CM

## 2021-08-14 ENCOUNTER — Ambulatory Visit: Payer: Medicaid Other | Admitting: Obstetrics & Gynecology

## 2021-08-14 ENCOUNTER — Encounter: Payer: Self-pay | Admitting: Family

## 2021-08-15 ENCOUNTER — Encounter: Payer: Self-pay | Admitting: *Deleted

## 2021-08-24 ENCOUNTER — Encounter: Payer: Self-pay | Admitting: Family

## 2021-08-24 ENCOUNTER — Ambulatory Visit (INDEPENDENT_AMBULATORY_CARE_PROVIDER_SITE_OTHER): Payer: Medicaid Other | Admitting: Family

## 2021-08-24 ENCOUNTER — Other Ambulatory Visit: Payer: Self-pay

## 2021-08-24 VITALS — BP 157/103 | HR 96 | Temp 98.2°F | Wt 163.0 lb

## 2021-08-24 DIAGNOSIS — Z5181 Encounter for therapeutic drug level monitoring: Secondary | ICD-10-CM

## 2021-08-24 DIAGNOSIS — Z21 Asymptomatic human immunodeficiency virus [HIV] infection status: Secondary | ICD-10-CM | POA: Diagnosis present

## 2021-08-24 DIAGNOSIS — R030 Elevated blood-pressure reading, without diagnosis of hypertension: Secondary | ICD-10-CM

## 2021-08-24 DIAGNOSIS — F1520 Other stimulant dependence, uncomplicated: Secondary | ICD-10-CM | POA: Diagnosis not present

## 2021-08-24 DIAGNOSIS — Z113 Encounter for screening for infections with a predominantly sexual mode of transmission: Secondary | ICD-10-CM | POA: Diagnosis not present

## 2021-08-24 MED ORDER — HYDROCHLOROTHIAZIDE 12.5 MG PO TABS: 12.5000 mg | ORAL_TABLET | Freq: Every day | ORAL | 2 refills | Status: DC

## 2021-08-24 NOTE — Assessment & Plan Note (Signed)
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  Likely contributing to her increased blood pressure as well as stress.  Discussed importance of cessation and resources available for quitting.  She is in the precontemplation stage and does not appear ready to quit.

## 2021-08-24 NOTE — Patient Instructions (Addendum)
Nice to see you.  We will check your lab work today.  Continue to take your medication daily as prescribed.  Refills have been sent to the pharmacy.  Please call [facility]) to schedule/follow up on your dental care at [phone] x [phone]  Plan for follow up in 1 months or sooner if needed   Have a great day and stay safe!

## 2021-08-24 NOTE — Assessment & Plan Note (Signed)
Ms. Christine continues to have elevated blood pressure today at 157/103.  No current neurological or ophthalmologic symptoms.  Given previous concern for edema of lower extremities we will start hydrochlorothiazide 12.5 mg p.o. daily.  Encouraged to monitor blood pressure at home as able.

## 2021-08-24 NOTE — Assessment & Plan Note (Signed)
Renal function and hepatic function stable with current dose of Biktarvy.  Recheck comprehensive metabolic panel.  Continue to monitor while on Biktarvy.

## 2021-08-24 NOTE — Progress Notes (Signed)
Brief Narrative   Patient ID: Kristin Hart, female    DOB: Dec 03, 1980, 41 y.o.   MRN: [id number]  Kristin Hart is a 42 y/o caucasian female diagnosed with HIV disease on 01/02/19 with risk factor of heterosexual contact and IV drug use. Initial viral load on 06/23/19 23,300 and CD4 count 577. Genotype with no significant drug resistance. Entered care at [facility] Stage 1. [id number] negative. No history of opportunistic infection. Sole medication regimen of Biktarvy  Subjective:    Chief Complaint  Patient presents with   Follow-up    HPI:  Kristin Hart is a 41 y.o. female with HIV disease last seen on 07/25/2021 with well-controlled virus and good adherence and tolerance to [employer].  Here today for routine follow-up.  Kristin Hart continues to take Kristin Hart Biktarvy daily as prescribed with no adverse side effects.  Feeling okay today.  Has noted elevated blood pressure and when she feels like Kristin Hart blood pressure is elevated she has taken Kristin Hart roommates lisinopril 5 mg dose which has helped with Kristin Hart symptoms.  Last used meth this morning. Denies fevers, chills, night sweats, headaches, changes in vision, neck pain/stiffness, nausea, diarrhea, vomiting, lesions or rashes.  Continues with methamphetamine usage with no alcohol or tobacco use.  Condoms provided.  Housing situation is currently unchanged from previous.  No Known Allergies    Outpatient Medications Prior to Visit  Medication Sig Dispense Refill   bictegravir-emtricitabine-tenofovir AF (BIKTARVY) 50-200-25 MG TABS tablet Take 1 tablet by mouth daily. 30 tablet 5   cetirizine (ZYRTEC) 10 MG tablet TAKE 1 TABLET BY MOUTH EVERY DAY 30 tablet 11   cyclobenzaprine (FLEXERIL) 5 MG tablet Take 1 tablet (5 mg total) by mouth at bedtime. 10 tablet 0   diclofenac Sodium (VOLTAREN) 1 % GEL Apply 2 g topically 4 (four) times daily as needed (for elbow pain). As needed for hip pain. 2 g 3   furosemide (LASIX) 20 MG tablet TAKE 1 TABLET (20 MG  TOTAL) BY MOUTH DAILY AS NEEDED FOR EDEMA. 20 tablet 0   gabapentin (NEURONTIN) 100 MG capsule TAKE 1 CAPSULE BY MOUTH TWICE A DAY 30 capsule 0   hydrocortisone cream 1 % Apply to affected area 2 times daily (Patient taking differently: Apply 1 application  topically 2 (two) times daily as needed for itching.) 15 g 0   ibuprofen (ADVIL) 600 MG tablet Take 1 tablet (600 mg total) by mouth every 6 (six) hours as needed. (Patient taking differently: Take 600 mg by mouth every 6 (six) hours as needed for moderate pain.) 30 tablet 0   Menthol, Topical Analgesic, (ICY HOT EX) Apply 1 application topically 2 (two) times daily as needed (pain).     naproxen (NAPROSYN) 500 MG tablet Take 1 tablet (500 mg total) by mouth daily as needed for moderate pain. 30 tablet 0   omeprazole (PRILOSEC) 40 MG capsule Take 1 capsule (40 mg total) by mouth daily. 30 capsule 3   ondansetron (ZOFRAN-ODT) 4 MG disintegrating tablet 4mg  ODT q4 hours prn nausea/vomit 20 tablet 0   topiramate (TOPAMAX) 25 MG tablet TAKE 1 TABLET (25 MG TOTAL) BY MOUTH DAILY. 30 tablet 1   valACYclovir (VALTREX) 1000 MG tablet TAKE 1 TABLET BY MOUTH TWICE A DAY 14 tablet 0   VENTOLIN HFA 108 (90 Base) MCG/ACT inhaler INHALE 1 PUFF INTO THE LUNGS EVERY 4 HOURS AS NEEDED. 18 each 2   No facility-administered medications prior to visit.     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, per pt   Diabetes mellitus without complication ([place])    type 2   Foreign body in small intestine    HIV (human immunodeficiency virus infection) ([place])    Hypothyroidism    Stomach ulcer      Past Surgical History:  Procedure Laterality Date   CESAREAN SECTION N/A 07/04/2016   Procedure: CESAREAN SECTION;  Surgeon: Truett Mainland, DO;  Location: [place];  Service: Obstetrics;  Laterality: N/A;   ENTEROSCOPY N/A 11/08/2017   Procedure: ENTEROSCOPY;  Surgeon: Irene Shipper, MD;  Location: [facility] ENDOSCOPY;  Service: Endoscopy;  Laterality: N/A;   NO PAST SURGERIES        Review of Systems  Constitutional:  Negative for appetite change, chills, diaphoresis, fatigue, fever and unexpected weight change.  Eyes:        Negative for acute change in vision  Respiratory:  Negative for chest tightness, shortness of breath and wheezing.   Cardiovascular:  Negative for chest pain.  Gastrointestinal:  Negative for diarrhea, nausea and vomiting.  Genitourinary:  Negative for dysuria, pelvic pain and vaginal discharge.  Musculoskeletal:  Negative for neck pain and neck stiffness.  Skin:  Negative for rash.  Neurological:  Negative for seizures, syncope, weakness and headaches.  Hematological:  Negative for adenopathy. Does not bruise/bleed easily.  Psychiatric/Behavioral:  Negative for hallucinations.       Objective:    BP (!) 157/103   Pulse 96   Temp 98.2 F (36.8 C) (Oral)   Wt 163 lb (73.9 kg)   BMI 25.53 kg/m  Nursing note and vital signs reviewed.   Physical Exam Constitutional:      General: She is not in acute distress.    Appearance: She is well-developed.  Eyes:     Conjunctiva/sclera: Conjunctivae normal.  Cardiovascular:     Rate and Rhythm: Normal rate and regular rhythm.     Heart sounds: Normal heart sounds. No murmur heard.    No friction rub. No gallop.  Pulmonary:     Effort: Pulmonary effort is normal. No respiratory distress.     Breath sounds: Normal breath sounds. No wheezing or rales.  Chest:     Chest wall: No tenderness.  Abdominal:     General: Bowel sounds are normal.     Palpations: Abdomen is soft.     Tenderness: There is no abdominal tenderness.  Musculoskeletal:     Cervical back: Neck supple.  Lymphadenopathy:     Cervical: No cervical adenopathy.  Skin:    General: Skin is warm and dry.     Findings: No rash.  Neurological:     Mental Status: She is alert and oriented to person, place, and time.   Psychiatric:        Behavior: Behavior normal.        Thought Content: Thought content normal.        Judgment: Judgment normal.         08/24/2021    3:49 PM 06/12/2021    3:46 PM 04/12/2021    2:58 PM 02/16/2021    3:39 PM 12/02/2020    2:20 PM  Depression screen PHQ 2/9  Decreased Interest 0 0 0 0 0  Down, Depressed, Hopeless 1 0 2 1 1   PHQ - 2 Score 1 0 2 1 1   Altered sleeping   3  1  Tired, decreased energy  2  1  Change in appetite   2  0  Feeling bad or failure about yourself    1  1  Trouble concentrating   1  1  Moving slowly or fidgety/restless   0  0  Suicidal thoughts   0  0  PHQ-9 Score   11  5  Difficult doing work/chores     Somewhat difficult       Assessment & Plan:    Patient Active Problem List   Diagnosis Date Noted   Therapeutic drug monitoring 08/24/2021   Birth control counseling 12/20/2020   Open wnd of finger 12/20/2020   Elevated BP without diagnosis of hypertension 09/21/2020   Grief 08/29/2020   Encounter for initial prescription of injectable contraceptive 07/29/2020   Left hip pain 07/17/2020   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   Screening for cervical cancer 04/21/2020   Screening for STDs (sexually transmitted diseases) 04/21/2020   Costochondritis 02/19/2020   Bilateral hip pain 02/19/2020   Elbow pain, right 12/04/2019   Housing problems 11/23/2019   Mental health disorder 10/12/2019   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   Paresthesias 10/12/2019   Healthcare maintenance 08/05/2019   HIV (human immunodeficiency virus infection) (HCC) 12/31/2018   Anxiety 05/29/2017   Self-mutilation 05/29/2017   Depression 06/07/2016   Marijuana use 06/07/2016   Low vitamin D level 03/15/2016   Prediabetes 02/22/2016   Asthma 02/22/2016   ADD (attention deficit disorder) 02/22/2016   Methamphetamine use disorder, severe, 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/22/2016   Hyperlipidemia with target LDL less than 70 01/31/2012    Migraine 01/31/2012   Tremor, 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     Problem List Items Addressed This Visit       Other   HIV (human immunodeficiency virus infection) (HCC) - Primary (Chronic)    Kristin Hart continues to have well-controlled virus with good adherence and tolerance to [employer].  Reviewed previous lab work and discussed plan of care.  Check blood work today.  Continue current dose of Biktarvy.  Plan for follow-up in 1 month or sooner if needed.      Relevant Orders   Comprehensive metabolic panel   HIV-1 RNA quant-no reflex-bld   T-helper cell (CD4)- ([facility] clinic only)   Methamphetamine use disorder, severe, dependence (HCC)    Continues to use methamphetamine.  Likely contributing to Kristin Hart increased blood pressure as well as stress.  Discussed importance of cessation and resources available for quitting.  She is in the precontemplation stage and does not appear ready to quit.      Screening for STDs (sexually transmitted diseases)   Relevant Orders   RPR   Elevated BP without diagnosis of hypertension    Kristin Hart continues to have elevated blood pressure today at 157/103.  No current neurological or ophthalmologic symptoms.  Given previous concern for edema of lower extremities we will start hydrochlorothiazide 12.5 mg p.o. daily.  Encouraged to monitor blood pressure at home as able.      Therapeutic drug monitoring    Renal function and hepatic function stable with current dose of Biktarvy.  Recheck comprehensive metabolic panel.  Continue to monitor while on Biktarvy.        I am having Alvan Dame. Rabe start on hydrochlorothiazide. I am also having Kristin Hart maintain Kristin Hart hydrocortisone cream, diclofenac Sodium, cyclobenzaprine, (Menthol, Topical Analgesic, (ICY HOT EX)), gabapentin, ibuprofen, omeprazole, cetirizine, Ventolin HFA, ondansetron, furosemide, bictegravir-emtricitabine-tenofovir AF, naproxen, topiramate, and valACYclovir.   Meds ordered this  encounter  Medications    hydrochlorothiazide (HYDRODIURIL) 12.5 MG tablet    Sig: Take 1 tablet (12.5 mg total) by mouth daily.    Dispense:  30 tablet    Refill:  2    Order Specific Question:   Supervising Provider    Answer:   Carlyle Basques [[id number]]     Follow-up: Return in about 1 month (around 09/23/2021).   Terri Piedra, MSN, FNP-C Nurse Practitioner [facility] for Infectious Disease [place] number: [phone]

## 2021-08-24 NOTE — Assessment & Plan Note (Signed)
Kristin Hart continues to have well-controlled virus with good adherence and tolerance to Biktarvy.  Reviewed previous lab work and discussed plan of care.  Check blood work today.  Continue current dose of Biktarvy.  Plan for follow-up in 1 month or sooner if needed.

## 2021-08-25 LAB — T-HELPER CELL (CD4) - (RCID CLINIC ONLY)
CD4 % Helper T Cell: 60 % (ref 33–65)
CD4 T Cell Abs: 1054 /uL (ref 400–1790)

## 2021-08-29 LAB — COMPREHENSIVE METABOLIC PANEL
AG Ratio: 1.4 (calc) (ref 1.0–2.5)
ALT: 13 U/L (ref 6–29)
AST: 16 U/L (ref 10–30)
Albumin: 4.2 g/dL (ref 3.6–5.1)
Alkaline phosphatase (APISO): 83 U/L (ref 31–125)
BUN/Creatinine Ratio: 20 (calc) (ref 6–22)
BUN: 20 mg/dL (ref 7–25)
CO2: 22 mmol/L (ref 20–32)
Calcium: 9.5 mg/dL (ref 8.6–10.2)
Chloride: 102 mmol/L (ref 98–110)
Creat: 1 mg/dL — ABNORMAL HIGH (ref 0.50–0.97)
Globulin: 2.9 g/dL (calc) (ref 1.9–3.7)
Glucose, Bld: 374 mg/dL — ABNORMAL HIGH (ref 65–99)
Potassium: 3.6 mmol/L (ref 3.5–5.3)
Sodium: 135 mmol/L (ref 135–146)
Total Bilirubin: 0.3 mg/dL (ref 0.2–1.2)
Total Protein: 7.1 g/dL (ref 6.1–8.1)

## 2021-08-29 LAB — RPR: RPR Ser Ql: NONREACTIVE

## 2021-08-29 LAB — HIV-1 RNA QUANT-NO REFLEX-BLD
HIV 1 RNA Quant: NOT DETECTED Copies/mL
HIV-1 RNA Quant, Log: NOT DETECTED Log cps/mL

## 2021-09-08 ENCOUNTER — Telehealth: Payer: Self-pay

## 2021-09-08 NOTE — Telephone Encounter (Signed)
Patient called office today to review lab results also to get in touch with case manger.  Informed patient labs came back looking good. Tested negative for syphillis and VL is undetectable . Congratulated patient on labs.  Kara Mead, case manger will be calling patient later today. Juanita Laster, RMA

## 2021-09-20 ENCOUNTER — Ambulatory Visit (INDEPENDENT_AMBULATORY_CARE_PROVIDER_SITE_OTHER): Payer: Medicaid Other | Admitting: Family

## 2021-09-20 ENCOUNTER — Other Ambulatory Visit: Payer: Self-pay

## 2021-09-20 ENCOUNTER — Encounter: Payer: Self-pay | Admitting: Family

## 2021-09-20 VITALS — BP 137/92 | HR 98 | Temp 98.2°F | Wt 168.0 lb

## 2021-09-20 DIAGNOSIS — R030 Elevated blood-pressure reading, without diagnosis of hypertension: Secondary | ICD-10-CM | POA: Diagnosis not present

## 2021-09-20 DIAGNOSIS — Z21 Asymptomatic human immunodeficiency virus [HIV] infection status: Secondary | ICD-10-CM | POA: Diagnosis present

## 2021-09-20 DIAGNOSIS — F1520 Other stimulant dependence, uncomplicated: Secondary | ICD-10-CM

## 2021-09-20 DIAGNOSIS — Z0000 Encounter for general adult medical examination without abnormal findings: Secondary | ICD-10-CM

## 2021-09-20 DIAGNOSIS — L02419 Cutaneous abscess of limb, unspecified: Secondary | ICD-10-CM | POA: Diagnosis not present

## 2021-09-20 DIAGNOSIS — Z5181 Encounter for therapeutic drug level monitoring: Secondary | ICD-10-CM

## 2021-09-20 MED ORDER — DOXYCYCLINE MONOHYDRATE 100 MG PO CAPS: 100.0000 mg | ORAL_CAPSULE | Freq: Two times a day (BID) | ORAL | 0 refills | Status: DC

## 2021-09-20 NOTE — Assessment & Plan Note (Signed)
Did not start hydrochlorothiazide secondary to cost of medication and current financial situation.  Blood pressure slightly improved today at 137/92.  As anticipated previously this is likely due to methamphetamine usage prior to previous appointments as she did not use methamphetamine directly before this appointment.  Continue to monitor blood pressure.

## 2021-09-20 NOTE — Assessment & Plan Note (Signed)
Continues to use methamphetamine on a regular basis.  Has no current intentions of quitting is in the precontemplation stage of change.  Reinforced importance of cessation to reduce risk of complications in the future.

## 2021-09-20 NOTE — Progress Notes (Signed)
Brief Narrative   Patient ID: Kristin Hart, female    DOB: 09/03/1980, 41 y.o.   MRN: [id number]  Kristin Hart is a 41 y/o caucasian female diagnosed with HIV disease on 01/02/19 with risk factor of heterosexual contact and IV drug use. Initial viral load on 06/23/19 23,300 and CD4 count 577. Genotype with no significant drug resistance. Entered care at [facility] Stage 1. [id number] negative. No history of opportunistic infection. Sole medication regimen of Biktarvy  Subjective:    Chief Complaint  Patient presents with   Follow-up    HPI:  Kristin Hart is a 41 y.o. female with HIV disease last seen on 08/24/2021 with well-controlled virus and good adherence and tolerance to her ART regimen of Biktarvy.  Viral load was undetectable with CD4 count of 1054.  RPR was nonreactive for syphilis.  Kidney function, liver function, electrolytes within normal ranges.  Glucose elevated as she was eating.  Here today for routine follow-up.  Kristin Hart has been taking her Biktarvy daily as prescribed with no adverse side effects.  Was not able to pick up the hydrochlorothiazide secondary to cost and being out of money.  Feeling well today with concern for 2 new lesions under her bilateral axilla that have been going on for about a week.  Described as tender, red, and swollen. Denies fevers, chills, night sweats, headaches, changes in vision, neck pain/stiffness, nausea, diarrhea, vomiting, lesions or rashes.  Kristin Hart has no problems obtaining medication from the pharmacy remains covered by Medicaid.  Denies feelings of being down, depressed, or hopeless recently.  Continues to use methamphetamine with no alcohol consumption or tobacco use.  Condoms offered.  Housing situation remains labile.  She did have conversations with her previous partner.    No Known Allergies    Outpatient Medications Prior to Visit  Medication Sig Dispense Refill   bictegravir-emtricitabine-tenofovir AF (BIKTARVY) 50-200-25  MG TABS tablet Take 1 tablet by mouth daily. 30 tablet 5   cetirizine (ZYRTEC) 10 MG tablet TAKE 1 TABLET BY MOUTH EVERY DAY 30 tablet 11   cyclobenzaprine (FLEXERIL) 5 MG tablet Take 1 tablet (5 mg total) by mouth at bedtime. 10 tablet 0   diclofenac Sodium (VOLTAREN) 1 % GEL Apply 2 g topically 4 (four) times daily as needed (for elbow pain). As needed for hip pain. 2 g 3   furosemide (LASIX) 20 MG tablet TAKE 1 TABLET (20 MG TOTAL) BY MOUTH DAILY AS NEEDED FOR EDEMA. 20 tablet 0   gabapentin (NEURONTIN) 100 MG capsule TAKE 1 CAPSULE BY MOUTH TWICE A DAY 30 capsule 0   hydrochlorothiazide (HYDRODIURIL) 12.5 MG tablet Take 1 tablet (12.5 mg total) by mouth daily. 30 tablet 2   hydrocortisone cream 1 % Apply to affected area 2 times daily (Patient taking differently: Apply 1 application  topically 2 (two) times daily as needed for itching.) 15 g 0   ibuprofen (ADVIL) 600 MG tablet Take 1 tablet (600 mg total) by mouth every 6 (six) hours as needed. (Patient taking differently: Take 600 mg by mouth every 6 (six) hours as needed for moderate pain.) 30 tablet 0   Menthol, Topical Analgesic, (ICY HOT EX) Apply 1 application topically 2 (two) times daily as needed (pain).     naproxen (NAPROSYN) 500 MG tablet Take 1 tablet (500 mg total) by mouth daily as needed for moderate pain. 30 tablet 0   omeprazole (PRILOSEC) 40 MG capsule Take 1 capsule (40 mg total) by mouth daily.  30 capsule 3   ondansetron (ZOFRAN-ODT) 4 MG disintegrating tablet 4mg  ODT q4 hours prn nausea/vomit 20 tablet 0   topiramate (TOPAMAX) 25 MG tablet TAKE 1 TABLET (25 MG TOTAL) BY MOUTH DAILY. 30 tablet 1   valACYclovir (VALTREX) 1000 MG tablet TAKE 1 TABLET BY MOUTH TWICE A DAY 14 tablet 0   VENTOLIN HFA 108 (90 Base) MCG/ACT inhaler INHALE 1 PUFF INTO THE LUNGS EVERY 4 HOURS AS NEEDED. 18 each 2   No facility-administered medications prior to visit.     Past Medical History:  Diagnosis Date   Asthma    Borderline personality  disorder (HCC)    with schizophrenic tendancies, per pt   Diabetes mellitus without complication (HCC)    type 2   Foreign body in small intestine    HIV (human immunodeficiency virus infection) (HCC)    Hypothyroidism    Stomach ulcer      Past Surgical History:  Procedure Laterality Date   CESAREAN SECTION N/A 07/04/2016   Procedure: CESAREAN SECTION;  Surgeon: 07/06/2016, DO;  Location: [facility] BIRTHING SUITES;  Service: Obstetrics;  Laterality: N/A;   ENTEROSCOPY N/A 11/08/2017   Procedure: ENTEROSCOPY;  Surgeon: 11/10/2017, MD;  Location: [facility] ENDOSCOPY;  Service: Endoscopy;  Laterality: N/A;   NO PAST SURGERIES        Review of Systems  Constitutional:  Negative for appetite change, chills, diaphoresis, fatigue, fever and unexpected weight change.  Eyes:        Negative for acute change in vision  Respiratory:  Negative for chest tightness, shortness of breath and wheezing.   Cardiovascular:  Negative for chest pain.  Gastrointestinal:  Negative for diarrhea, nausea and vomiting.  Genitourinary:  Negative for dysuria, pelvic pain and vaginal discharge.  Musculoskeletal:  Negative for neck pain and neck stiffness.  Skin:        Bilateral axillary lesions.   Neurological:  Negative for seizures, syncope, weakness and headaches.  Hematological:  Negative for adenopathy. Does not bruise/bleed easily.  Psychiatric/Behavioral:  Negative for hallucinations.       Objective:    BP (!) 137/92   Pulse 98   Temp 98.2 F (36.8 C) (Oral)   Wt 168 lb (76.2 kg)   SpO2 100%   BMI 26.31 kg/m  Nursing note and vital signs reviewed.  Physical Exam Constitutional:      General: She is not in acute distress.    Appearance: She is well-developed.  HENT:     Mouth/Throat:     Comments: Poor dentition.  Cardiovascular:     Rate and Rhythm: Normal rate and regular rhythm.     Heart sounds: Normal heart sounds.  Pulmonary:     Effort: Pulmonary effort is normal.     Breath  sounds: Normal breath sounds.  Skin:    General: Skin is warm and dry.     Comments: Bilateral axillary lesions appear consistent with boils/abscess. Tender and warm. No drainage.   Neurological:     Mental Status: She is alert and oriented to person, place, and time.  Psychiatric:        Behavior: Behavior normal.        Thought Content: Thought content normal.        Judgment: Judgment normal.         08/24/2021    3:49 PM 06/12/2021    3:46 PM 04/12/2021    2:58 PM 02/16/2021    3:39 PM 12/02/2020    2:20  PM  Depression screen PHQ 2/9  Decreased Interest 0 0 0 0 0  Down, Depressed, Hopeless 1 0 2 1 1   PHQ - 2 Score 1 0 2 1 1   Altered sleeping   3  1  Tired, decreased energy   2  1  Change in appetite   2  0  Feeling bad or failure about yourself    1  1  Trouble concentrating   1  1  Moving slowly or fidgety/restless   0  0  Suicidal thoughts   0  0  PHQ-9 Score   11  5  Difficult doing work/chores     Somewhat difficult       Assessment & Plan:    Patient Active Problem List   Diagnosis Date Noted   Axillary abscess 09/20/2021   Therapeutic drug monitoring 08/24/2021   Birth control counseling 12/20/2020   Open wnd of finger 12/20/2020   Elevated BP without diagnosis of hypertension 09/21/2020   Grief 08/29/2020   Encounter for initial prescription of injectable contraceptive 07/29/2020   Left hip pain 07/17/2020   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   Screening for cervical cancer 04/21/2020   Screening for STDs (sexually transmitted diseases) 04/21/2020   Costochondritis 02/19/2020   Bilateral hip pain 02/19/2020   Elbow pain, right 12/04/2019   Housing problems 11/23/2019   Mental health disorder 10/12/2019   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   Paresthesias 10/12/2019   Healthcare maintenance 08/05/2019   HIV (human immunodeficiency virus infection) (HCC) 12/31/2018   Anxiety 05/29/2017   Self-mutilation 05/29/2017   Depression  06/07/2016   Marijuana use 06/07/2016   Low vitamin D level 03/15/2016   Prediabetes 02/22/2016   Asthma 02/22/2016   ADD (attention deficit disorder) 02/22/2016   Methamphetamine use disorder, severe, 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/22/2016   Hyperlipidemia with target LDL less than 70 01/31/2012   Migraine 01/31/2012   Tremor, 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     Problem List Items Addressed This Visit       Other   HIV (human immunodeficiency virus infection) (HCC) - Primary (Chronic)    Kristin Hart continues to have well-controlled virus with good adherence and tolerance to her ART regimen of Biktarvy.  Reviewed lab work and discussed plan of care.  Continue current dose of Biktarvy.  Plan for follow-up in 1 month or sooner if needed with lab work in 3 months.      Methamphetamine use disorder, severe, dependence (HCC)    Continues to use methamphetamine on a regular basis.  Has no current intentions of quitting is in the precontemplation stage of change.  Reinforced importance of cessation to reduce risk of complications in the future.      Healthcare maintenance    Discussed importance of safe sexual practices and condom use.  Condoms and lubrication provided. Discussed family-planning and is currently on Depo-Provera and will be due for next injection at next office visit in 1 month. Routine dental care up-to-date and awaiting next dental appointment.      Elevated BP without diagnosis of hypertension    Did not start hydrochlorothiazide secondary to cost of medication and current financial situation.  Blood pressure slightly improved today at 137/92.  As anticipated previously this is likely due to methamphetamine usage prior to previous appointments as she did not use methamphetamine directly before this appointment.  Continue to monitor blood pressure.      Therapeutic drug monitoring    Renal function  stable with current dose of Biktarvy.  Current creatinine of 1 with estimated GFR of greater than  60.  Continue current dose of Biktarvy monitor renal function.      Axillary abscess    Kristin Hart has bilateral axillary abscesses likely Staphylococcus infection.  Treat conservatively with warm compresses and will start 7 days of doxycycline.  If symptoms worsen or do not improve may need incision and drainage.        I am having Kristin Hart start on doxycycline. I am also having her maintain her hydrocortisone cream, diclofenac Sodium, cyclobenzaprine, (Menthol, Topical Analgesic, (ICY HOT EX)), gabapentin, ibuprofen, omeprazole, cetirizine, Ventolin HFA, ondansetron, furosemide, bictegravir-emtricitabine-tenofovir AF, naproxen, topiramate, valACYclovir, and hydrochlorothiazide.   Meds ordered this encounter  Medications   doxycycline (MONODOX) 100 MG capsule    Sig: Take 1 capsule (100 mg total) by mouth 2 (two) times daily.    Dispense:  14 capsule    Refill:  0    Order Specific Question:   Supervising Provider    Answer:   Judyann Munson [[id number]]     Follow-up: Return in about 1 month (around 10/21/2021), or if symptoms worsen or fail to improve.   Marcos Eke, MSN, FNP-C Nurse Practitioner [facility] for Infectious Disease [facility] Main number: [phone]

## 2021-09-20 NOTE — Assessment & Plan Note (Signed)
Ms. Boxwell has bilateral axillary abscesses likely Staphylococcus infection.  Treat conservatively with warm compresses and will start 7 days of doxycycline.  If symptoms worsen or do not improve may need incision and drainage.

## 2021-09-20 NOTE — Assessment & Plan Note (Signed)
Renal function stable with current dose of Biktarvy.  Current creatinine of 1 with estimated GFR of greater than 60.  Continue current dose of Biktarvy monitor renal function.

## 2021-09-20 NOTE — Patient Instructions (Addendum)
Nice to see you.  Continue to take your medication daily as prescribed.  Refills have been sent to the pharmacy.  Start taking the doxycycline. Continue with warm compresses. If worsening may need to be drained which can be done at primary care or Urgent Care.   Plan for follow up in 1 months or sooner if needed with lab work on the same day.  Have a great day and stay safe!  Topical body decolonization (one of the following):  Chlorhexidine gluconate (2% or 4% solution): daily washes or use of a disposable impregnated cloth for 5 to 14 days, or  Dilute bleach baths (one teaspoon bleach per gallon of water, or one-fourth cup bleach per one-fourth tub [approximately 13 gallons of water] for 15 minutes twice weekly) for approximately three months

## 2021-09-20 NOTE — Assessment & Plan Note (Signed)
   Discussed importance of safe sexual practices and condom use.  Condoms and lubrication provided.  Discussed family-planning and is currently on Depo-Provera and will be due for next injection at next office visit in 1 month.  Routine dental care up-to-date and awaiting next dental appointment.

## 2021-09-20 NOTE — Assessment & Plan Note (Signed)
Kristin Hart continues to have well-controlled virus with good adherence and tolerance to her ART regimen of Biktarvy.  Reviewed lab work and discussed plan of care.  Continue current dose of Biktarvy.  Plan for follow-up in 1 month or sooner if needed with lab work in 3 months.

## 2021-09-21 ENCOUNTER — Ambulatory Visit: Payer: Medicaid Other | Admitting: Family

## 2021-09-29 ENCOUNTER — Encounter (HOSPITAL_COMMUNITY): Payer: Self-pay

## 2021-09-29 ENCOUNTER — Emergency Department (HOSPITAL_COMMUNITY)
Admission: EM | Admit: 2021-09-29 | Discharge: 2021-09-29 | Disposition: A | Discharge status: Home / Self Care | Payer: Medicaid Other | Attending: Emergency Medicine | Admitting: Emergency Medicine

## 2021-09-29 ENCOUNTER — Ambulatory Visit (HOSPITAL_COMMUNITY)
Admission: EM | Admit: 2021-09-29 | Discharge: 2021-09-29 | Disposition: A | Discharge status: Home / Self Care | Payer: Medicaid Other | Attending: Internal Medicine | Admitting: Internal Medicine

## 2021-09-29 ENCOUNTER — Emergency Department (HOSPITAL_BASED_OUTPATIENT_CLINIC_OR_DEPARTMENT_OTHER): Payer: Medicaid Other

## 2021-09-29 DIAGNOSIS — Z21 Asymptomatic human immunodeficiency virus [HIV] infection status: Secondary | ICD-10-CM | POA: Diagnosis not present

## 2021-09-29 DIAGNOSIS — L03116 Cellulitis of left lower limb: Secondary | ICD-10-CM | POA: Diagnosis not present

## 2021-09-29 DIAGNOSIS — M7989 Other specified soft tissue disorders: Secondary | ICD-10-CM | POA: Diagnosis not present

## 2021-09-29 DIAGNOSIS — M79605 Pain in left leg: Secondary | ICD-10-CM | POA: Diagnosis present

## 2021-09-29 LAB — CBC WITH DIFFERENTIAL/PLATELET
Abs Immature Granulocytes: 0.02 10*3/uL (ref 0.00–0.07)
Basophils Absolute: 0 10*3/uL (ref 0.0–0.1)
Basophils Relative: 0 %
Eosinophils Absolute: 0.3 10*3/uL (ref 0.0–0.5)
Eosinophils Relative: 4 %
HCT: 35.9 % — ABNORMAL LOW (ref 36.0–46.0)
Hemoglobin: 10.7 g/dL — ABNORMAL LOW (ref 12.0–15.0)
Immature Granulocytes: 0 %
Lymphocytes Relative: 13 %
Lymphs Abs: 1.2 10*3/uL (ref 0.7–4.0)
MCH: 23.9 pg — ABNORMAL LOW (ref 26.0–34.0)
MCHC: 29.8 g/dL — ABNORMAL LOW (ref 30.0–36.0)
MCV: 80.3 fL (ref 80.0–100.0)
Monocytes Absolute: 0.6 10*3/uL (ref 0.1–1.0)
Monocytes Relative: 7 %
Neutro Abs: 6.6 10*3/uL (ref 1.7–7.7)
Neutrophils Relative %: 76 %
Platelets: 277 10*3/uL (ref 150–400)
RBC: 4.47 MIL/uL (ref 3.87–5.11)
RDW: 13.6 % (ref 11.5–15.5)
WBC: 8.7 10*3/uL (ref 4.0–10.5)
nRBC: 0 % (ref 0.0–0.2)

## 2021-09-29 LAB — BASIC METABOLIC PANEL
Anion gap: 9 (ref 5–15)
BUN: 10 mg/dL (ref 6–20)
CO2: 22 mmol/L (ref 22–32)
Calcium: 8.6 mg/dL — ABNORMAL LOW (ref 8.9–10.3)
Chloride: 103 mmol/L (ref 98–111)
Creatinine, Ser: 0.88 mg/dL (ref 0.44–1.00)
GFR, Estimated: >60 mL/min (ref >60)
Glucose, Bld: 443 mg/dL — ABNORMAL HIGH (ref 70–99)
Potassium: 3.3 mmol/L — ABNORMAL LOW (ref 3.5–5.1)
Sodium: 134 mmol/L — ABNORMAL LOW (ref 135–145)

## 2021-09-29 LAB — LACTIC ACID, PLASMA: Lactic Acid, Venous: 2 mmol/L (ref 0.5–1.9)

## 2021-09-29 LAB — I-STAT BETA HCG BLOOD, ED (MC, WL, AP ONLY): I-stat hCG, quantitative: <5 m[IU]/mL (ref <5)

## 2021-09-29 MED ORDER — HYDROXYZINE HCL 25 MG PO TABS
25.0000 mg | ORAL_TABLET | Freq: Once | ORAL | Status: AC
Start: 2021-09-29 — End: 2021-09-29
  Administered 2021-09-29: 25 mg via ORAL
  Filled 2021-09-29: qty 1

## 2021-09-29 MED ORDER — HYDROCODONE-ACETAMINOPHEN 5-325 MG PO TABS
ORAL_TABLET | ORAL | Status: AC
  Filled 2021-09-29: qty 1

## 2021-09-29 MED ORDER — DOXYCYCLINE HYCLATE 100 MG PO CAPS: 100.0000 mg | ORAL_CAPSULE | Freq: Two times a day (BID) | ORAL | 0 refills | Status: DC

## 2021-09-29 MED ORDER — DOXYCYCLINE HYCLATE 100 MG PO CAPS
100.0000 mg | ORAL_CAPSULE | Freq: Two times a day (BID) | ORAL | 0 refills | Status: DC
  Filled 2021-09-29: qty 19, 10d supply, fill #0

## 2021-09-29 MED ORDER — HYDROXYZINE HCL 25 MG PO TABS: 25.0000 mg | ORAL_TABLET | Freq: Four times a day (QID) | ORAL | 0 refills | Status: DC

## 2021-09-29 MED ORDER — IBUPROFEN 400 MG PO TABS
600.0000 mg | ORAL_TABLET | Freq: Once | ORAL | Status: AC
  Administered 2021-09-29: 600 mg via ORAL
  Filled 2021-09-29: qty 1

## 2021-09-29 MED ORDER — ONDANSETRON 4 MG PO TBDP
4.0000 mg | ORAL_TABLET | Freq: Once | ORAL | Status: AC
  Administered 2021-09-29: 4 mg via ORAL

## 2021-09-29 MED ORDER — HYDROCODONE-ACETAMINOPHEN 5-325 MG PO TABS
1.0000 | ORAL_TABLET | Freq: Once | ORAL | Status: AC
  Administered 2021-09-29: 1 via ORAL

## 2021-09-29 MED ORDER — HYDROXYZINE HCL 25 MG PO TABS
25.0000 mg | ORAL_TABLET | Freq: Four times a day (QID) | ORAL | 0 refills | Status: DC
  Filled 2021-09-29: qty 12, 3d supply, fill #0

## 2021-09-29 MED ORDER — ONDANSETRON 4 MG PO TBDP
ORAL_TABLET | ORAL | Status: AC
Start: 2021-09-29 — End: ?
  Filled 2021-09-29: qty 1

## 2021-09-29 MED ORDER — DOXYCYCLINE HYCLATE 100 MG PO TABS
100.0000 mg | ORAL_TABLET | Freq: Once | ORAL | Status: AC
  Administered 2021-09-29: 100 mg via ORAL
  Filled 2021-09-29: qty 1

## 2021-09-29 NOTE — ED Provider Notes (Signed)
[facility]    CSN: [id number] Arrival date & time: 09/29/21  3875      History   Chief Complaint Chief Complaint  Patient presents with   Leg Pain    HPI Kristin Hart is a 41 y.o. female.   Patient presents to urgent care for evaluation of bilateral lower extremity swelling that started approximately 1 week ago.  Left lower extremity slightly proximal to the left ankle is very swollen, erythematous, warm to the touch, and painful.  Symptoms are worse to the left leg than the right leg and have worsened further over the last 24 hours.  Denies itching and reports significant pain that is a 10 on a scale 0-10 to the bilateral lower extremities.  Patient is a diabetic and has a history of borderline personality disorder as well as HIV.  Patient denies recent long car rides, travel, long periods of sitting, shortness of breath, chest pain, weakness, bug bites, or any other trauma to the bilateral lower extremities that she is aware of.  Patient does use meth but states that she is "not an injector" and instead smokes the meth.  She last smoked meth last night.  She denies any other drug use or alcohol use.  Denies recent exposure to new allergens including new laundry detergents, soaps, implants.  She states that she has been very cold over the last few days but denies fever at home.  Reports a small amount of nausea and a mild headache as well.  She has been taking ibuprofen at home over the last few days with minimal relief of her symptoms and her pain.  Pain is much worse with ambulation and patient is "barely able to walk".     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, per pt   Diabetes mellitus without complication (HCC)    type 2   Foreign body in small intestine    HIV (human immunodeficiency virus infection) (HCC)    Hypothyroidism    Stomach ulcer     Patient Active Problem List    Diagnosis Date Noted   Axillary abscess 09/20/2021   Therapeutic drug monitoring 08/24/2021   Birth control counseling 12/20/2020   Open wnd of finger 12/20/2020   Elevated BP without diagnosis of hypertension 09/21/2020   Grief 08/29/2020   Encounter for initial prescription of injectable contraceptive 07/29/2020   Left hip pain 07/17/2020   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   Screening for cervical cancer 04/21/2020   Screening for STDs (sexually transmitted diseases) 04/21/2020   Costochondritis 02/19/2020   Bilateral hip pain 02/19/2020   Elbow pain, right 12/04/2019   Housing problems 11/23/2019   Mental health disorder 10/12/2019   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   Paresthesias 10/12/2019   Healthcare maintenance 08/05/2019   HIV (human immunodeficiency virus infection) (HCC) 12/31/2018   Anxiety 05/29/2017   Self-mutilation 05/29/2017   Depression 06/07/2016   Marijuana use 06/07/2016   Low vitamin D level 03/15/2016   Prediabetes 02/22/2016   Asthma 02/22/2016   ADD (attention deficit disorder) 02/22/2016   Methamphetamine use disorder, severe, 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/22/2016   Hyperlipidemia with target LDL less than 70 01/31/2012   Migraine 01/31/2012   Tremor, 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    Past Surgical History:  Procedure Laterality Date   CESAREAN SECTION N/A 07/04/2016   Procedure: CESAREAN SECTION;  Surgeon: Rhona Raider  Adrian Blackwater, DO;  Location: [facility];  Service: Obstetrics;  Laterality: N/A;   ENTEROSCOPY N/A 11/08/2017   Procedure: ENTEROSCOPY;  Surgeon: Hilarie Fredrickson, MD;  Location: [facility];  Service: Endoscopy;  Laterality: N/A;   NO PAST SURGERIES      OB History     Gravida  1   Para  1   Term  1   Preterm      AB      Living         SAB      IAB      Ectopic      Multiple  0   Live Births               Home Medications    Prior to Admission medications   Medication Sig Start Date End  Date Taking? Authorizing Provider  bictegravir-emtricitabine-tenofovir AF (BIKTARVY) 50-200-25 MG TABS tablet Take 1 tablet by mouth daily. 07/25/21  Yes Veryl Speak, FNP  cetirizine (ZYRTEC) 10 MG tablet TAKE 1 TABLET BY MOUTH EVERY DAY 05/31/21  Yes Lilland, Alana, DO  cyclobenzaprine (FLEXERIL) 5 MG tablet Take 1 tablet (5 mg total) by mouth at bedtime. 07/13/20  Yes Peggyann Shoals C, DO  diclofenac Sodium (VOLTAREN) 1 % GEL Apply 2 g topically 4 (four) times daily as needed (for elbow pain). As needed for hip pain. 04/13/20  Yes Lilland, Alana, DO  furosemide (LASIX) 20 MG tablet TAKE 1 TABLET (20 MG TOTAL) BY MOUTH DAILY AS NEEDED FOR EDEMA. 06/29/21  Yes Veryl Speak, FNP  hydrocortisone cream 1 % Apply to affected area 2 times daily Patient taking differently: Apply 1 application  topically 2 (two) times daily as needed for itching. 08/17/18  Yes Henderly, Britni A, PA-C  ibuprofen (ADVIL) 600 MG tablet Take 1 tablet (600 mg total) by mouth every 6 (six) hours as needed. Patient taking differently: Take 600 mg by mouth every 6 (six) hours as needed for moderate pain. 10/22/20  Yes Upstill, Shari, PA-C  Menthol, Topical Analgesic, (ICY HOT EX) Apply 1 application topically 2 (two) times daily as needed (pain).   Yes [provider]  naproxen (NAPROSYN) 500 MG tablet Take 1 tablet (500 mg total) by mouth daily as needed for moderate pain. 07/25/21  Yes Veryl Speak, FNP  omeprazole (PRILOSEC) 40 MG capsule Take 1 capsule (40 mg total) by mouth daily. 01/19/21  Yes Veryl Speak, FNP  topiramate (TOPAMAX) 25 MG tablet TAKE 1 TABLET (25 MG TOTAL) BY MOUTH DAILY. 07/28/21  Yes Lilland, Alana, DO  valACYclovir (VALTREX) 1000 MG tablet TAKE 1 TABLET BY MOUTH TWICE A DAY 08/02/21  Yes Calone, Tama Headings, FNP  VENTOLIN HFA 108 (90 Base) MCG/ACT inhaler INHALE 1 PUFF INTO THE LUNGS EVERY 4 HOURS AS NEEDED. 05/31/21  Yes Lilland, Alana, DO  doxycycline (MONODOX) 100 MG capsule Take 1  capsule (100 mg total) by mouth 2 (two) times daily. 09/20/21   Veryl Speak, FNP  gabapentin (NEURONTIN) 100 MG capsule TAKE 1 CAPSULE BY MOUTH TWICE A DAY 09/27/20   Lilland, Alana, DO  hydrochlorothiazide (HYDRODIURIL) 12.5 MG tablet Take 1 tablet (12.5 mg total) by mouth daily. 08/24/21   Veryl Speak, FNP  ondansetron (ZOFRAN-ODT) 4 MG disintegrating tablet 4mg  ODT q4 hours prn nausea/vomit 06/04/21   06/06/21, DO    Family History History reviewed. No pertinent family history.  Social History Social History   Tobacco Use   Smoking status: Never  Smokeless tobacco: Never  Vaping Use   Vaping Use: Never used  Substance Use Topics   Alcohol use: No   Drug use: Yes    Types: Methamphetamines    Comment: states she does Crystal almost daily     Allergies   Patient has no known allergies.   Review of Systems Review of Systems Per HPI  Physical Exam Triage Vital Signs ED Triage Vitals  Enc Vitals Group     BP 09/29/21 1028 (!) 148/85     Pulse Rate 09/29/21 1028 98     Resp 09/29/21 1028 20     Temp 09/29/21 1028 98 F (36.7 C)     Temp Source 09/29/21 1028 Oral     SpO2 09/29/21 1028 98 %     Weight --      Height --      Head Circumference --      Peak Flow --      Pain Score 09/29/21 1029 10     Pain Loc --      Pain Edu? --      Excl. in GC? --    No data found.  Updated Vital Signs BP (!) 148/85 (BP Location: Left Arm)   Pulse 98   Temp 98 F (36.7 C) (Oral)   Resp 20   SpO2 98%   Visual Acuity Right Eye Distance:   Left Eye Distance:   Bilateral Distance:    Right Eye Near:   Left Eye Near:    Bilateral Near:     Physical Exam Vitals and nursing note reviewed.  Constitutional:      Appearance: She is ill-appearing. She is not toxic-appearing.     Comments: Very pleasant patient sitting on exam in position of comfort table in no acute distress.   HENT:     Head: Normocephalic and atraumatic.     Right Ear: Hearing and  external ear normal.     Left Ear: Hearing and external ear normal.     Nose: Nose normal.     Mouth/Throat:     Lips: Pink.     Mouth: Mucous membranes are moist.  Eyes:     General: Lids are normal. Vision grossly intact. Gaze aligned appropriately.     Extraocular Movements: Extraocular movements intact.     Conjunctiva/sclera: Conjunctivae normal.  Pulmonary:     Effort: Pulmonary effort is normal.     Breath sounds: Normal breath sounds.  Abdominal:     Palpations: Abdomen is soft.  Musculoskeletal:     Cervical back: Neck supple.  Skin:    General: Skin is warm and dry.     Capillary Refill: Capillary refill takes less than 2 seconds.     Findings: No rash.     Comments: Cellulitis present to the left lower extremity as depicted in image below.  Left lower extremity cellulitis is to the posterior lateral aspect of the distal tibia region.  Warmth, erythema, and nonpitting swelling present to the left lower extremity.  Multiple erythematous and irritated appearing splotchy lesions to the bilateral lower extremities present.  +2 dorsalis pedis pulses bilaterally.  Sensation is intact.  Significant pain with palpation present of the left lower extremity.   Neurological:     General: No focal deficit present.     Mental Status: She is alert and oriented to person, place, and time. Mental status is at baseline.     Cranial Nerves: No dysarthria or facial asymmetry.  Gait: Gait is intact.  Psychiatric:        Attention and Perception: Attention normal.        Mood and Affect: Mood normal.        Speech: Speech normal.        Behavior: Behavior normal.        Thought Content: Thought content normal.        Judgment: Judgment normal.     Comments: Tearful to exam.    Left lower extremity    Right lower extremity    Bilateral lower extremities    UC Treatments / Results  Labs (all labs ordered are listed, but only abnormal results are displayed) Labs Reviewed - No  data to display  EKG   Radiology No results found.  Procedures Procedures (including critical care time)  Medications Ordered in UC Medications  HYDROcodone-acetaminophen (NORCO/VICODIN) 5-325 MG per tablet 1 tablet (1 tablet Oral Given 09/29/21 1127)  ondansetron (ZOFRAN-ODT) disintegrating tablet 4 mg (4 mg Oral Given 09/29/21 1127)    Initial Impression / Assessment and Plan / UC Course  I have reviewed the triage vital signs and the nursing notes.  Pertinent labs & imaging results that were available during my care of the patient were reviewed by me and considered in my medical decision making (see chart for details).  1.  Leg swelling We will deferred to the emergency department for further evaluation to rule out DVT versus cellulitis etiology.  It is likely that patient has cellulitis and would benefit from a round of antibiotics, but unable to rule out DVT with resources at urgent care.  Patient is in significant amount of pain and is very tearful to exam.  Patient given hydrocodone pill in the clinic and transported to the emergency room via [facility].  Discussed the risks of deferring emergency department visit at this time to which patient verbalizes understanding and agreement with plan.  Patient discharged from urgent care with [facility] in stable condition.  Vital signs remained stable throughout urgent care course.  Final Clinical Impressions(s) / UC Diagnoses   Final diagnoses:  Leg swelling     Discharge Instructions      Patient sent to the emergency department via [facility] for further evaluation to rule out possible DVT versus cellulitis.     ED Prescriptions   None    PDMP not reviewed this encounter.   Carlisle Beers, [place] 09/30/21 2004

## 2021-09-29 NOTE — ED Provider Notes (Signed)
[facility] EMERGENCY DEPARTMENT Provider Note   CSN: [id number] Arrival date & time: 09/29/21  1206     History No chief complaint on file.   Kristin Hart is a 41 y.o. female with a past medical history of HIV, prediabetes, recurrent cellulitis and methamphetamine use presenting today with the complaint of lower extremity pain and itching.  Reports has been going on for a week.  Saw her primary care this morning and they sent her here for DVT study.  She says she has not been using any IV drugs but says still smokes meth.  No recent travel, surgery, estrogen use or tobacco use.  HPI     Home Medications Prior to Admission medications   Medication Sig Start Date End Date Taking? Authorizing Provider  bictegravir-emtricitabine-tenofovir AF (BIKTARVY) 50-200-25 MG TABS tablet Take 1 tablet by mouth daily. 07/25/21   Veryl Speak, FNP  cetirizine (ZYRTEC) 10 MG tablet TAKE 1 TABLET BY MOUTH EVERY DAY 05/31/21   Lilland, Alana, DO  cyclobenzaprine (FLEXERIL) 5 MG tablet Take 1 tablet (5 mg total) by mouth at bedtime. 07/13/20   Dollene Cleveland, DO  diclofenac Sodium (VOLTAREN) 1 % GEL Apply 2 g topically 4 (four) times daily as needed (for elbow pain). As needed for hip pain. 04/13/20   Lilland, Alana, DO  doxycycline (MONODOX) 100 MG capsule Take 1 capsule (100 mg total) by mouth 2 (two) times daily. 09/20/21   Veryl Speak, FNP  doxycycline (VIBRAMYCIN) 100 MG capsule Take 1 capsule (100 mg total) by mouth 2 (two) times daily. 09/29/21   Randall Colden A, PA-C  furosemide (LASIX) 20 MG tablet TAKE 1 TABLET (20 MG TOTAL) BY MOUTH DAILY AS NEEDED FOR EDEMA. 06/29/21   Veryl Speak, FNP  gabapentin (NEURONTIN) 100 MG capsule TAKE 1 CAPSULE BY MOUTH TWICE A DAY 09/27/20   Lilland, Alana, DO  hydrochlorothiazide (HYDRODIURIL) 12.5 MG tablet Take 1 tablet (12.5 mg total) by mouth daily. 08/24/21   Veryl Speak, FNP  hydrocortisone cream 1 % Apply to affected  area 2 times daily Patient taking differently: Apply 1 application  topically 2 (two) times daily as needed for itching. 08/17/18   Henderly, Britni A, PA-C  hydrOXYzine (ATARAX) 25 MG tablet Take 1 tablet (25 mg total) by mouth every 6 (six) hours. 09/29/21   Lorean Ekstrand A, PA-C  ibuprofen (ADVIL) 600 MG tablet Take 1 tablet (600 mg total) by mouth every 6 (six) hours as needed. Patient taking differently: Take 600 mg by mouth every 6 (six) hours as needed for moderate pain. 10/22/20   Elpidio Anis, PA-C  Menthol, Topical Analgesic, (ICY HOT EX) Apply 1 application topically 2 (two) times daily as needed (pain).    [provider]  naproxen (NAPROSYN) 500 MG tablet Take 1 tablet (500 mg total) by mouth daily as needed for moderate pain. 07/25/21   Veryl Speak, FNP  omeprazole (PRILOSEC) 40 MG capsule Take 1 capsule (40 mg total) by mouth daily. 01/19/21   Veryl Speak, FNP  ondansetron (ZOFRAN-ODT) 4 MG disintegrating tablet 4mg  ODT q4 hours prn nausea/vomit 06/04/21   06/06/21, DO  topiramate (TOPAMAX) 25 MG tablet TAKE 1 TABLET (25 MG TOTAL) BY MOUTH DAILY. 07/28/21   Lilland, Alana, DO  valACYclovir (VALTREX) 1000 MG tablet TAKE 1 TABLET BY MOUTH TWICE A DAY 08/02/21   08/04/21, FNP  VENTOLIN HFA 108 (90 Base) MCG/ACT inhaler INHALE 1 PUFF INTO THE LUNGS  EVERY 4 HOURS AS NEEDED. 05/31/21   Lilland, Percival Spanish, DO      Allergies    Patient has no known allergies.    Review of Systems   Review of Systems  Physical Exam Updated Vital Signs BP 138/78   Pulse 80   Temp 98.2 F (36.8 C) (Oral)   Resp 18   SpO2 100%  Physical Exam Vitals and nursing note reviewed.  Constitutional:      Appearance: Normal appearance.  HENT:     Head: Normocephalic and atraumatic.  Eyes:     General: No scleral icterus.    Conjunctiva/sclera: Conjunctivae normal.  Pulmonary:     Effort: Pulmonary effort is normal. No respiratory distress.  Musculoskeletal:     Right lower  leg: Edema present.     Left lower leg: Edema present.     Comments: Nonpitting edema to the level of the tibial tuberosities bilaterally.  Posterior left lower extremity cellulitis.  No drainage or abscess.  Skin:    Findings: No rash.     Comments: Multiple small splotches of irritated skin on the lower extremities.  Neurological:     Mental Status: She is alert.  Psychiatric:        Mood and Affect: Mood normal.     ED Results / Procedures / Treatments   Labs (all labs ordered are listed, but only abnormal results are displayed) Labs Reviewed  CBC WITH DIFFERENTIAL/PLATELET - Abnormal; Notable for the following components:      Result Value   Hemoglobin 10.7 (*)    HCT 35.9 (*)    MCH 23.9 (*)    MCHC 29.8 (*)    All other components within normal limits  BASIC METABOLIC PANEL - Abnormal; Notable for the following components:   Sodium 134 (*)    Potassium 3.3 (*)    Glucose, Bld 443 (*)    Calcium 8.6 (*)    All other components within normal limits  LACTIC ACID, PLASMA - Abnormal; Notable for the following components:   Lactic Acid, Venous 2.0 (*)    All other components within normal limits  LACTIC ACID, PLASMA  I-STAT BETA HCG BLOOD, ED ([facility], [facility], [facility] ONLY)    EKG None  Radiology VAS [place] LOWER EXTREMITY VENOUS (DVT) (ONLY [facility] & [facility])  Result Date: 09/29/2021  Lower Venous DVT Study Patient Name:  Kristin Hart  Date of Exam:   09/29/2021 Medical Rec #: [id number]        Accession #:    [id number] Date of Birth: 07/12/1980       Patient Gender: F Patient Age:   61 years Exam Location:  [facility] Procedure:      VAS [place] LOWER EXTREMITY VENOUS (DVT) Referring Phys: Parke Poisson --------------------------------------------------------------------------------  Comparison Study: Previous exam 12/08/20, negaitve for DVT. Performing Technologist: Magdalene River BS, RVT  Examination Guidelines: A complete evaluation includes B-mode imaging, spectral Doppler, color  Doppler, and power Doppler as needed of all accessible portions of each vessel. Bilateral testing is considered an integral part of a complete examination. Limited examinations for reoccurring indications may be performed as noted. The reflux portion of the exam is performed with the patient in reverse Trendelenburg.  +---------+---------------+---------+-----------+----------+--------------+ RIGHT    CompressibilityPhasicitySpontaneityPropertiesThrombus Aging +---------+---------------+---------+-----------+----------+--------------+ CFV      Full           Yes      Yes                                 +---------+---------------+---------+-----------+----------+--------------+  SFJ      Full                                                        +---------+---------------+---------+-----------+----------+--------------+ FV Prox  Full                                                        +---------+---------------+---------+-----------+----------+--------------+ FV Mid   Full                                                        +---------+---------------+---------+-----------+----------+--------------+ FV DistalFull                                                        +---------+---------------+---------+-----------+----------+--------------+ PFV      Full                                                        +---------+---------------+---------+-----------+----------+--------------+ POP      Full           Yes      Yes                                 +---------+---------------+---------+-----------+----------+--------------+ PTV      Full                                                        +---------+---------------+---------+-----------+----------+--------------+ PERO     Full                                                        +---------+---------------+---------+-----------+----------+--------------+    +---------+---------------+---------+-----------+----------+--------------+ LEFT     CompressibilityPhasicitySpontaneityPropertiesThrombus Aging +---------+---------------+---------+-----------+----------+--------------+ CFV      Full           Yes      Yes                                 +---------+---------------+---------+-----------+----------+--------------+ SFJ      Full                                                        +---------+---------------+---------+-----------+----------+--------------+  FV Prox  Full                                                        +---------+---------------+---------+-----------+----------+--------------+ FV Mid   Full                                                        +---------+---------------+---------+-----------+----------+--------------+ FV DistalFull                                                        +---------+---------------+---------+-----------+----------+--------------+ PFV      Full                                                        +---------+---------------+---------+-----------+----------+--------------+ POP      Full           Yes      Yes                                 +---------+---------------+---------+-----------+----------+--------------+ PTV      Full                                                        +---------+---------------+---------+-----------+----------+--------------+ PERO     Full                                                        +---------+---------------+---------+-----------+----------+--------------+    Summary: BILATERAL: - No evidence of deep vein thrombosis seen in the lower extremities, bilaterally. -No evidence of popliteal cyst, bilaterally.   *See table(s) above for measurements and observations.    Preliminary     Procedures Procedures   Medications Ordered in ED Medications  doxycycline (VIBRA-TABS) tablet 100 mg (100 mg Oral Given  09/29/21 1743)  hydrOXYzine (ATARAX) tablet 25 mg (25 mg Oral Given 09/29/21 1743)  ibuprofen (ADVIL) tablet 600 mg (600 mg Oral Given 09/29/21 1743)    ED Course/ Medical Decision Making/ A&P                           Medical Decision Making Risk Prescription drug management.   41 year old female presenting today with the concern for DVT per PCP.  DVT study negative.    PE: Cellulitis to the left lower extremity, none on the right side.  No open wounds or drainage.  Some other red splotches on lower extremities somewhat  consistent with contact dermatitis  Treatment: Given first dose of doxycycline for cellulitis, Atarax for itching and ibuprofen for pain.  MDM/disposition: Patient is neurovascularly intact with strong DP pulses bilaterally.  Cellulitis noted to the left lower extremity that will be treated with doxycycline to cover MRSA due to her drug use and diabetes.  She will use any hydroxyzine for the itching.  She is agreeable to this plan and will follow-up with her PCP.  Discharged and neurovascularly intact condition   Final Clinical Impression(s) / ED Diagnoses Final diagnoses:  Cellulitis of left lower extremity    Rx / DC Orders ED Discharge Orders          Ordered    doxycycline (VIBRAMYCIN) 100 MG capsule  2 times daily,   Status:  Discontinued        09/29/21 1716    hydrOXYzine (ATARAX) 25 MG tablet  Every 6 hours,   Status:  Discontinued        09/29/21 1719    doxycycline (VIBRAMYCIN) 100 MG capsule  2 times daily,   Status:  Discontinued        09/29/21 1720    hydrOXYzine (ATARAX) 25 MG tablet  Every 6 hours,   Status:  Discontinued        09/29/21 1720    doxycycline (VIBRAMYCIN) 100 MG capsule  2 times daily        09/29/21 1851    hydrOXYzine (ATARAX) 25 MG tablet  Every 6 hours        09/29/21 1851          (Meds resent to [facility] per social work request)   Results and diagnoses were explained to the patient. Return precautions discussed in  full. Patient had no additional questions and expressed complete understanding.   This chart was dictated using voice recognition software.  Despite best efforts to proofread,  errors can occur which can change the documentation meaning.     Woodroe Chen 09/29/21 1854    Bethann Berkshire, MD 10/01/21 [phone]

## 2021-09-29 NOTE — ED Triage Notes (Signed)
Patient transferred to [facility] from Urgent care for evaluation of swollen, tender left lower leg. Swelling started one week ago, sent to ED to rule out DVT. Patient denies chest pain, denies shortness of breath.

## 2021-09-29 NOTE — Progress Notes (Signed)
Bilateral LE venous duplex study completed. Please see CV Proc for preliminary results.  Thurma Priego BS, RVT 09/29/2021 2:24 PM

## 2021-09-29 NOTE — ED Triage Notes (Signed)
Pt states she has had redness in her lt lower leg less than 1 week. Lt lower leg is now swollen from her foot to lower leg. Right lower posterior leg has redness

## 2021-09-29 NOTE — Discharge Instructions (Addendum)
Patient sent to the emergency department via [facility] for further evaluation to rule out possible DVT versus cellulitis.

## 2021-09-29 NOTE — Discharge Instructions (Signed)
Take the doxycycline as prescribed.  It may upset your stomach so take it with food.  Please make a follow-up appointment with your PCP.  Tell them about your visit today and to assure improvement in your lower extremity infection.  The hydroxyzine is for itching.  It may make you tired, so do not take it if you are driving, operating machinery or caring for others.

## 2021-09-29 NOTE — ED Provider Triage Note (Signed)
Emergency Medicine Provider Triage Evaluation Note  [name] , a 41 y.o. female  was evaluated in triage.  Pt complains of swelling to bilateral lower extremities.  Patient reports that she has had swelling to bilateral lower extremities with left greater than right over the last week.  Patient states that over the last few days she developed increased pain and redness to left lower extremity.  Patient has history of HIV and is currently on Biktarvy.  Has not missed any recent doses of Biktarvy.  Review of Systems  Positive: Swelling to bilateral lower extremities, Negative: Chest pain, shortness of breath, hemoptysis, fever, chills  Physical Exam  There were no vitals taken for this visit. Gen:   Awake, no distress   Resp:  Normal effort  MSK:   Moves extremities without difficulty  Other:  Swelling and tenderness to bilateral lower extremities.  Erythema to posterior left lower leg.  +2 DP pulse bilaterally  Medical Decision Making  Medically screening exam initiated at 12:15 PM.  Appropriate orders placed.  Kristin Hart was informed that the remainder of the evaluation will be completed by another provider, this initial triage assessment does not replace that evaluation, and the importance of remaining in the ED until their evaluation is complete.  Concern for DVT versus infection.  Will obtain ultrasound imaging as well as lab testing.   Haskel Schroeder, [place] 09/29/21 1217

## 2021-10-02 ENCOUNTER — Other Ambulatory Visit (HOSPITAL_COMMUNITY): Payer: Self-pay

## 2021-10-24 ENCOUNTER — Encounter: Payer: Self-pay | Admitting: Family

## 2021-10-24 ENCOUNTER — Ambulatory Visit (INDEPENDENT_AMBULATORY_CARE_PROVIDER_SITE_OTHER): Payer: Medicaid Other | Admitting: Family

## 2021-10-24 ENCOUNTER — Other Ambulatory Visit: Payer: Self-pay

## 2021-10-24 VITALS — BP 170/130 | HR 108 | Resp 16 | Ht 67.0 in | Wt 167.8 lb

## 2021-10-24 DIAGNOSIS — Z659 Problem related to unspecified psychosocial circumstances: Secondary | ICD-10-CM

## 2021-10-24 DIAGNOSIS — Z30013 Encounter for initial prescription of injectable contraceptive: Secondary | ICD-10-CM

## 2021-10-24 DIAGNOSIS — F1520 Other stimulant dependence, uncomplicated: Secondary | ICD-10-CM

## 2021-10-24 DIAGNOSIS — Z21 Asymptomatic human immunodeficiency virus [HIV] infection status: Secondary | ICD-10-CM

## 2021-10-24 DIAGNOSIS — Z0000 Encounter for general adult medical examination without abnormal findings: Secondary | ICD-10-CM

## 2021-10-24 MED ORDER — BICTEGRAVIR-EMTRICITAB-TENOFOV 50-200-25 MG PO TABS: 1.0000 | ORAL_TABLET | Freq: Every day | ORAL | 5 refills | Status: DC

## 2021-10-24 MED ORDER — HYDROCHLOROTHIAZIDE 12.5 MG PO TABS: 12.5000 mg | ORAL_TABLET | Freq: Every day | ORAL | 2 refills | Status: DC

## 2021-10-24 MED ORDER — MEDROXYPROGESTERONE ACETATE 150 MG/ML IM SUSP
150.0000 mg | Freq: Once | INTRAMUSCULAR | Status: AC
  Administered 2021-10-24: 150 mg via INTRAMUSCULAR

## 2021-10-24 MED ORDER — VALACYCLOVIR HCL 500 MG PO TABS: 500.0000 mg | ORAL_TABLET | Freq: Two times a day (BID) | ORAL | 2 refills | Status: DC

## 2021-10-24 NOTE — Patient Instructions (Addendum)
Nice to see you.  Continue to take your medication daily as prescribed.  Refills have been sent to the pharmacy.  Please say that you spoke with Trula Ore regarding waiving the co-pay for medication.   Plan for follow up in 1 months or sooner if needed with lab work on the same day.  Have a great day and stay safe!

## 2021-10-24 NOTE — Progress Notes (Signed)
Brief Narrative   Patient ID: Kristin Hart, female    DOB: 10-04-1980, 41 y.o.   MRN: [id number]  Ms. Eckroth is a 41 y/o caucasian female diagnosed with HIV disease on 01/02/19 with risk factor of heterosexual contact and IV drug use. Initial viral load on 06/23/19 23,300 and CD4 count 577. Genotype with no significant drug resistance. Entered care at [facility] Stage 1. [id number] negative. No history of opportunistic infection. Sole medication regimen of Biktarvy  Subjective:    No chief complaint on file.   HPI:  Kristin Hart is a 41 y.o. female with HIV disease last seen on 09/20/21 with well controlled virus and good adherence and tolerance to Biktarvy. Was seen in the ED for cellulitis in the interim and treated with doxycycline. Here today for routine follow up.  Ms. Savich has been off all of her medications since her last appointment secondary to inability of affording the copay of the medication. No longer able to stay in one of the houses that she was staying in and now back at her original house without electricity. Feels insecure and is unsure of what direction to turn at this point. Does continue to use methamphetamine. Tearful at times.   No Known Allergies    Outpatient Medications Prior to Visit  Medication Sig Dispense Refill   diclofenac Sodium (VOLTAREN) 1 % GEL Apply 2 g topically 4 (four) times daily as needed (for elbow pain). As needed for hip pain. 2 g 3   doxycycline (MONODOX) 100 MG capsule Take 1 capsule (100 mg total) by mouth 2 (two) times daily. 14 capsule 0   cetirizine (ZYRTEC) 10 MG tablet TAKE 1 TABLET BY MOUTH EVERY DAY (Patient not taking: Reported on 10/24/2021) 30 tablet 11   cyclobenzaprine (FLEXERIL) 5 MG tablet Take 1 tablet (5 mg total) by mouth at bedtime. (Patient not taking: Reported on 10/24/2021) 10 tablet 0   doxycycline (VIBRAMYCIN) 100 MG capsule Take 1 capsule (100 mg total) by mouth 2 (two) times daily. (Patient not taking: Reported on  10/24/2021) 19 capsule 0   furosemide (LASIX) 20 MG tablet TAKE 1 TABLET (20 MG TOTAL) BY MOUTH DAILY AS NEEDED FOR EDEMA. (Patient not taking: Reported on 10/24/2021) 20 tablet 0   gabapentin (NEURONTIN) 100 MG capsule TAKE 1 CAPSULE BY MOUTH TWICE A DAY (Patient not taking: Reported on 10/24/2021) 30 capsule 0   hydrocortisone cream 1 % Apply to affected area 2 times daily (Patient not taking: Reported on 10/24/2021) 15 g 0   hydrOXYzine (ATARAX) 25 MG tablet Take 1 tablet (25 mg total) by mouth every 6 (six) hours. (Patient not taking: Reported on 10/24/2021) 12 tablet 0   ibuprofen (ADVIL) 600 MG tablet Take 1 tablet (600 mg total) by mouth every 6 (six) hours as needed. (Patient not taking: Reported on 10/24/2021) 30 tablet 0   Menthol, Topical Analgesic, (ICY HOT EX) Apply 1 application topically 2 (two) times daily as needed (pain). (Patient not taking: Reported on 10/24/2021)     naproxen (NAPROSYN) 500 MG tablet Take 1 tablet (500 mg total) by mouth daily as needed for moderate pain. (Patient not taking: Reported on 10/24/2021) 30 tablet 0   omeprazole (PRILOSEC) 40 MG capsule Take 1 capsule (40 mg total) by mouth daily. (Patient not taking: Reported on 10/24/2021) 30 capsule 3   ondansetron (ZOFRAN-ODT) 4 MG disintegrating tablet 4mg  ODT q4 hours prn nausea/vomit (Patient not taking: Reported on 10/24/2021) 20 tablet 0   topiramate (TOPAMAX)  25 MG tablet TAKE 1 TABLET (25 MG TOTAL) BY MOUTH DAILY. (Patient not taking: Reported on 10/24/2021) 30 tablet 1   VENTOLIN HFA 108 (90 Base) MCG/ACT inhaler INHALE 1 PUFF INTO THE LUNGS EVERY 4 HOURS AS NEEDED. (Patient not taking: Reported on 10/24/2021) 18 each 2   bictegravir-emtricitabine-tenofovir AF (BIKTARVY) 50-200-25 MG TABS tablet Take 1 tablet by mouth daily. (Patient not taking: Reported on 10/24/2021) 30 tablet 5   hydrochlorothiazide (HYDRODIURIL) 12.5 MG tablet Take 1 tablet (12.5 mg total) by mouth daily. (Patient not taking: Reported on 10/24/2021) 30  tablet 2   valACYclovir (VALTREX) 1000 MG tablet TAKE 1 TABLET BY MOUTH TWICE A DAY (Patient not taking: Reported on 10/24/2021) 14 tablet 0   No facility-administered medications prior to visit.     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, per pt   Diabetes mellitus without complication ([place])    type 2   Foreign body in small intestine    HIV (human immunodeficiency virus infection) ([place])    Hypothyroidism    Stomach ulcer      Past Surgical History:  Procedure Laterality Date   CESAREAN SECTION N/A 07/04/2016   Procedure: CESAREAN SECTION;  Surgeon: Truett Mainland, DO;  Location: [place];  Service: Obstetrics;  Laterality: N/A;   ENTEROSCOPY N/A 11/08/2017   Procedure: ENTEROSCOPY;  Surgeon: Irene Shipper, MD;  Location: [facility] ENDOSCOPY;  Service: Endoscopy;  Laterality: N/A;   NO PAST SURGERIES        Review of Systems  Constitutional:  Negative for appetite change, chills, diaphoresis, fatigue, fever and unexpected weight change.  Eyes:        Negative for acute change in vision  Respiratory:  Negative for chest tightness, shortness of breath and wheezing.   Cardiovascular:  Negative for chest pain.  Gastrointestinal:  Negative for diarrhea, nausea and vomiting.  Genitourinary:  Negative for dysuria, pelvic pain and vaginal discharge.  Musculoskeletal:  Negative for neck pain and neck stiffness.  Skin:  Negative for rash.  Neurological:  Negative for seizures, syncope, weakness and headaches.  Hematological:  Negative for adenopathy. Does not bruise/bleed easily.  Psychiatric/Behavioral:  Positive for dysphoric mood. Negative for hallucinations, self-injury, sleep disturbance and suicidal ideas. The patient is nervous/anxious.       Objective:    BP (!) 170/130   Pulse (!) 108   Resp 16   Ht 5\' 7"  (1.702 m)   Wt 167 lb 12.8 oz (76.1 kg)   LMP  (LMP Unknown) Comment:  spotting off and on-  SpO2 100%   BMI 26.28 kg/m  Nursing note and vital signs reviewed.  Physical Exam Constitutional:      General: She is not in acute distress.    Appearance: She is well-developed.  Eyes:     Conjunctiva/sclera: Conjunctivae normal.  Cardiovascular:     Rate and Rhythm: Normal rate and regular rhythm.     Heart sounds: Normal heart sounds. No murmur heard.    No friction rub. No gallop.  Pulmonary:     Effort: Pulmonary effort is normal. No respiratory distress.     Breath sounds: Normal breath sounds. No wheezing or rales.  Chest:     Chest wall: No tenderness.  Abdominal:     General: Bowel sounds are normal.     Palpations: Abdomen is soft.     Tenderness: There is no abdominal tenderness.  Musculoskeletal:     Cervical back: Neck supple.  Lymphadenopathy:     Cervical: No cervical adenopathy.  Skin:    General: Skin is warm and dry.     Findings: No rash.  Neurological:     Mental Status: She is alert and oriented to person, place, and time.  Psychiatric:        Attention and Perception: Attention normal.        Mood and Affect: Mood is anxious and depressed. Affect is labile.        Speech: Speech normal.        Behavior: Behavior is slowed and withdrawn.        Thought Content: Thought content normal.        Cognition and Memory: Cognition normal.        Judgment: Judgment normal.         10/24/2021    2:44 PM 08/24/2021    3:49 PM 06/12/2021    3:46 PM 04/12/2021    2:58 PM 02/16/2021    3:39 PM  Depression screen PHQ 2/9  Decreased Interest 0 0 0 0 0  Down, Depressed, Hopeless 3 1 0 2 1  PHQ - 2 Score 3 1 0 2 1  Altered sleeping 2   3   Tired, decreased energy 0   2   Change in appetite 1   2   Feeling bad or failure about yourself  3   1   Trouble concentrating 1   1   Moving slowly or fidgety/restless 0   0   Suicidal thoughts 0   0   PHQ-9 Score 10   11        Assessment & Plan:    Patient Active Problem List   Diagnosis  Date Noted   Poor social situation 10/25/2021   Axillary abscess 09/20/2021   Therapeutic drug monitoring 08/24/2021   Birth control counseling 12/20/2020   Open wnd of finger 12/20/2020   Elevated BP without diagnosis of hypertension 09/21/2020   Grief 08/29/2020   Encounter for initial prescription of injectable contraceptive 07/29/2020   Left hip pain 07/17/2020   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   Screening for cervical cancer 04/21/2020   Screening for STDs (sexually transmitted diseases) 04/21/2020   Costochondritis 02/19/2020   Bilateral hip pain 02/19/2020   Elbow pain, right 12/04/2019   Housing problems 11/23/2019   Mental health disorder 10/12/2019   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   Paresthesias 10/12/2019   Healthcare maintenance 08/05/2019   HIV (human immunodeficiency virus infection) (HCC) 12/31/2018   Anxiety 05/29/2017   Self-mutilation 05/29/2017   Depression 06/07/2016   Marijuana use 06/07/2016   Low vitamin D level 03/15/2016   Prediabetes 02/22/2016   Asthma 02/22/2016   ADD (attention deficit disorder) 02/22/2016   Methamphetamine use disorder, severe, 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/22/2016   Hyperlipidemia with target LDL less than 70 01/31/2012   Migraine 01/31/2012   Tremor, 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     Problem List Items Addressed This Visit       Other   HIV (human immunodeficiency virus infection) (HCC) - Primary (Chronic)    Ms. Stivers has been off medication for the past month secondary to inability to afford the copay.  Advised to notify clinic if she is having difficulty with getting medications to help find solutions. She is able to fill her medication at [facility] who will waive her copay this time for her. Continue current dose  of [place]. Plan for follow up in 1 month or sooner if needed.       Relevant Medications   bictegravir-emtricitabine-tenofovir AF (BIKTARVY) 50-200-25 MG TABS tablet   valACYclovir (VALTREX)  500 MG tablet   Methamphetamine use disorder, severe, dependence ([place])    Ms. Bleecker continues to use methamphetamine which is complicating her social situation. Discussed possibility of entering a treatment program if available. Continues to have some resistance. Will continue to work towards cessation.       Healthcare maintenance    Discussed importance of safe sexual practice and condom use. Condoms offered. Depo-provera administered today.       Encounter for initial prescription of injectable contraceptive   Poor social situation    Ms. Lycan continues to struggle with her social situation having lost a place to stay recently and not able to stay with her son's grandmother which leaves her back to a home without electricity and with infestations. She is working with [employer] for available resources and Terrence Dupont, her case manager was able to join for this office visit. Discussed importance of taking care of basic needs first which unfortunately I think methamphetamine use is complicating. She will meet with Terrence Dupont again and groceries and toiletries provided.         I have changed Nicola Girt. Souffrant's valACYclovir. I am also having her maintain her hydrocortisone cream, diclofenac Sodium, cyclobenzaprine, (Menthol, Topical Analgesic, (ICY HOT EX)), gabapentin, ibuprofen, omeprazole, cetirizine, Ventolin HFA, ondansetron, furosemide, naproxen, topiramate, doxycycline, hydrOXYzine, bictegravir-emtricitabine-tenofovir AF, and hydrochlorothiazide. We administered medroxyPROGESTERone.   Meds ordered this encounter  Medications   bictegravir-emtricitabine-tenofovir AF (BIKTARVY) 50-200-25 MG TABS tablet    Sig: Take 1 tablet by mouth daily.    Dispense:  30 tablet    Refill:  5    Order Specific Question:   Supervising Provider    Answer:   Baxter Flattery, CYNTHIA [[id number]]   valACYclovir (VALTREX) 500 MG tablet    Sig: Take 1 tablet (500 mg total) by mouth 2 (two) times daily.    Dispense:   60 tablet    Refill:  2    Order Specific Question:   Supervising Provider    Answer:   Baxter Flattery, CYNTHIA [[id number]]   hydrochlorothiazide (HYDRODIURIL) 12.5 MG tablet    Sig: Take 1 tablet (12.5 mg total) by mouth daily.    Dispense:  30 tablet    Refill:  2    Order Specific Question:   Supervising Provider    Answer:   Carlyle Basques [[id number]]   medroxyPROGESTERone (DEPO-PROVERA) injection 150 mg     Follow-up: Return in about 1 month (around 11/24/2021), or if symptoms worsen or fail to improve.   Terri Piedra, MSN, FNP-C Nurse Practitioner [facility] for Infectious Disease [place] number: [phone]

## 2021-10-25 DIAGNOSIS — Z659 Problem related to unspecified psychosocial circumstances: Secondary | ICD-10-CM | POA: Insufficient documentation

## 2021-10-25 NOTE — Assessment & Plan Note (Signed)
   Discussed importance of safe sexual practice and condom use. Condoms offered.  Depo-provera administered today.

## 2021-10-25 NOTE — Assessment & Plan Note (Signed)
Kristin Hart continues to use methamphetamine which is complicating her social situation. Discussed possibility of entering a treatment program if available. Continues to have some resistance. Will continue to work towards cessation.

## 2021-10-25 NOTE — Assessment & Plan Note (Signed)
Ms. Starkes has been off medication for the past month secondary to inability to afford the copay.  Advised to notify clinic if she is having difficulty with getting medications to help find solutions. She is able to fill her medication at [facility] who will waive her copay this time for her. Continue current dose of Biktarvy. Plan for follow up in 1 month or sooner if needed.

## 2021-10-25 NOTE — Assessment & Plan Note (Signed)
Kristin Hart continues to struggle with her social situation having lost a place to stay recently and not able to stay with her son's grandmother which leaves her back to a home without electricity and with infestations. She is working with Kristin Hart for available resources and Kristin Hart, her case manager was able to join for this office visit. Discussed importance of taking care of basic needs first which unfortunately I think methamphetamine use is complicating. She will meet with Kristin Hart again and groceries and toiletries provided.

## 2021-11-23 ENCOUNTER — Ambulatory Visit: Payer: Medicaid Other | Admitting: Family

## 2021-11-23 NOTE — Progress Notes (Deleted)
Brief Narrative   Patient ID: Kristin Hart, female    DOB: Nov 05, 1980, 41 y.o.   MRN: [id number]  Ms. Wakeland is a 41 y/o caucasian female diagnosed with HIV disease on 01/02/19 with risk factor of heterosexual contact and IV drug use. Initial viral load on 06/23/19 23,300 and CD4 count 577. Genotype with no significant drug resistance. Entered care at [facility] Stage 1. [id number] negative. No history of opportunistic infection. Sole medication regimen of Biktarvy  Subjective:    No chief complaint on file.   HPI:  Kristin Hart is a 41 y.o. female with HIV disease last seen on 10/24/21 with well controlled virus and good adherence and tolerance to Biktarvy. Continued to use methamphetamine and her social situation had become more complicated. [facility] is assisting. Here today for follow up.    No Known Allergies    Outpatient Medications Prior to Visit  Medication Sig Dispense Refill   bictegravir-emtricitabine-tenofovir AF (BIKTARVY) 50-200-25 MG TABS tablet Take 1 tablet by mouth daily. 30 tablet 5   cetirizine (ZYRTEC) 10 MG tablet TAKE 1 TABLET BY MOUTH EVERY DAY (Patient not taking: Reported on 10/24/2021) 30 tablet 11   cyclobenzaprine (FLEXERIL) 5 MG tablet Take 1 tablet (5 mg total) by mouth at bedtime. (Patient not taking: Reported on 10/24/2021) 10 tablet 0   diclofenac Sodium (VOLTAREN) 1 % GEL Apply 2 g topically 4 (four) times daily as needed (for elbow pain). As needed for hip pain. 2 g 3   doxycycline (VIBRAMYCIN) 100 MG capsule Take 1 capsule (100 mg total) by mouth 2 (two) times daily. (Patient not taking: Reported on 10/24/2021) 19 capsule 0   furosemide (LASIX) 20 MG tablet TAKE 1 TABLET (20 MG TOTAL) BY MOUTH DAILY AS NEEDED FOR EDEMA. (Patient not taking: Reported on 10/24/2021) 20 tablet 0   gabapentin (NEURONTIN) 100 MG capsule TAKE 1 CAPSULE BY MOUTH TWICE A DAY (Patient not taking: Reported on 10/24/2021) 30 capsule 0   hydrochlorothiazide (HYDRODIURIL) 12.5 MG tablet  Take 1 tablet (12.5 mg total) by mouth daily. 30 tablet 2   hydrocortisone cream 1 % Apply to affected area 2 times daily (Patient not taking: Reported on 10/24/2021) 15 g 0   hydrOXYzine (ATARAX) 25 MG tablet Take 1 tablet (25 mg total) by mouth every 6 (six) hours. (Patient not taking: Reported on 10/24/2021) 12 tablet 0   ibuprofen (ADVIL) 600 MG tablet Take 1 tablet (600 mg total) by mouth every 6 (six) hours as needed. (Patient not taking: Reported on 10/24/2021) 30 tablet 0   Menthol, Topical Analgesic, (ICY HOT EX) Apply 1 application topically 2 (two) times daily as needed (pain). (Patient not taking: Reported on 10/24/2021)     naproxen (NAPROSYN) 500 MG tablet Take 1 tablet (500 mg total) by mouth daily as needed for moderate pain. (Patient not taking: Reported on 10/24/2021) 30 tablet 0   omeprazole (PRILOSEC) 40 MG capsule Take 1 capsule (40 mg total) by mouth daily. (Patient not taking: Reported on 10/24/2021) 30 capsule 3   ondansetron (ZOFRAN-ODT) 4 MG disintegrating tablet 4mg  ODT q4 hours prn nausea/vomit (Patient not taking: Reported on 10/24/2021) 20 tablet 0   topiramate (TOPAMAX) 25 MG tablet TAKE 1 TABLET (25 MG TOTAL) BY MOUTH DAILY. (Patient not taking: Reported on 10/24/2021) 30 tablet 1   valACYclovir (VALTREX) 500 MG tablet Take 1 tablet (500 mg total) by mouth 2 (two) times daily. 60 tablet 2   VENTOLIN HFA 108 (90 Base) MCG/ACT inhaler INHALE  1 PUFF INTO THE LUNGS EVERY 4 HOURS AS NEEDED. (Patient not taking: Reported on 10/24/2021) 18 each 2   No facility-administered medications prior to visit.     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, per pt   Diabetes mellitus without complication (HCC)    type 2   Foreign body in small intestine    HIV (human immunodeficiency virus infection) (HCC)    Hypothyroidism    Stomach ulcer      Past Surgical History:  Procedure Laterality Date    CESAREAN SECTION N/A 07/04/2016   Procedure: CESAREAN SECTION;  Surgeon: [name], DO;  Location: [facility] BIRTHING SUITES;  Service: Obstetrics;  Laterality: N/A;   ENTEROSCOPY N/A 11/08/2017   Procedure: ENTEROSCOPY;  Surgeon: Hilarie Fredrickson, MD;  Location: [facility] ENDOSCOPY;  Service: Endoscopy;  Laterality: N/A;   NO PAST SURGERIES        Review of Systems    Objective:    LMP  (LMP Unknown) Comment: spotting off and on- Nursing note and vital signs reviewed.  Physical Exam      10/24/2021    2:44 PM 08/24/2021    3:49 PM 06/12/2021    3:46 PM 04/12/2021    2:58 PM 02/16/2021    3:39 PM  Depression screen PHQ 2/9  Decreased Interest 0 0 0 0 0  Down, Depressed, Hopeless 3 1 0 2 1  PHQ - 2 Score 3 1 0 2 1  Altered sleeping 2   3   Tired, decreased energy 0   2   Change in appetite 1   2   Feeling bad or failure about yourself  3   1   Trouble concentrating 1   1   Moving slowly or fidgety/restless 0   0   Suicidal thoughts 0   0   PHQ-9 Score 10   11        Assessment & Plan:    Patient Active Problem List   Diagnosis Date Noted   Poor social situation 10/25/2021   Axillary abscess 09/20/2021   Therapeutic drug monitoring 08/24/2021   Birth control counseling 12/20/2020   Open wnd of finger 12/20/2020   Elevated BP without diagnosis of hypertension 09/21/2020   Grief 08/29/2020   Encounter for initial prescription of injectable contraceptive 07/29/2020   Left hip pain 07/17/2020   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   Screening for cervical cancer 04/21/2020   Screening for STDs (sexually transmitted diseases) 04/21/2020   Costochondritis 02/19/2020   Bilateral hip pain 02/19/2020   Elbow pain, right 12/04/2019   Housing problems 11/23/2019   Mental health disorder 10/12/2019   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   Paresthesias 10/12/2019   Healthcare maintenance 08/05/2019   HIV (human immunodeficiency virus infection) (HCC) 12/31/2018    Anxiety 05/29/2017   Self-mutilation 05/29/2017   Depression 06/07/2016   Marijuana use 06/07/2016   Low vitamin D level 03/15/2016   Prediabetes 02/22/2016   Asthma 02/22/2016   ADD (attention deficit disorder) 02/22/2016   Methamphetamine use disorder, severe, 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/22/2016   Hyperlipidemia with target LDL less than 70 01/31/2012   Migraine 01/31/2012   Tremor, 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     Problem List Items Addressed This Visit   None    I am having Morrie Sheldon R. Linhardt maintain her hydrocortisone cream, diclofenac Sodium, cyclobenzaprine, (Menthol, Topical Analgesic, (ICY HOT EX)),  gabapentin, ibuprofen, omeprazole, cetirizine, Ventolin HFA, ondansetron, furosemide, naproxen, topiramate, doxycycline, hydrOXYzine, bictegravir-emtricitabine-tenofovir AF, valACYclovir, and hydrochlorothiazide.   No orders of the defined types were placed in this encounter.    Follow-up: No follow-ups on file.   Marcos Eke, MSN, FNP-C Nurse Practitioner [facility] for Infectious Disease [facility] Main number: [phone]

## 2021-11-27 ENCOUNTER — Ambulatory Visit (INDEPENDENT_AMBULATORY_CARE_PROVIDER_SITE_OTHER): Payer: Medicaid Other | Admitting: Family

## 2021-11-27 ENCOUNTER — Other Ambulatory Visit: Payer: Self-pay

## 2021-11-27 ENCOUNTER — Encounter: Payer: Self-pay | Admitting: Family

## 2021-11-27 VITALS — BP 138/84 | HR 90 | Temp 98.2°F | Resp 16 | Wt 173.8 lb

## 2021-11-27 DIAGNOSIS — I158 Other secondary hypertension: Secondary | ICD-10-CM

## 2021-11-27 DIAGNOSIS — Z659 Problem related to unspecified psychosocial circumstances: Secondary | ICD-10-CM

## 2021-11-27 DIAGNOSIS — Z21 Asymptomatic human immunodeficiency virus [HIV] infection status: Secondary | ICD-10-CM | POA: Diagnosis not present

## 2021-11-27 MED ORDER — AMOXICILLIN-POT CLAVULANATE 875-125 MG PO TABS: 1.0000 | ORAL_TABLET | Freq: Two times a day (BID) | ORAL | 0 refills | Status: DC

## 2021-11-27 NOTE — Progress Notes (Signed)
Brief Narrative   Patient ID: Kristin Hart, female    DOB: 08/18/1980, 41 y.o.   MRN: [id number]  Ms. Kristin Hart is a 41 y/o caucasian female diagnosed with HIV disease on 01/02/19 with risk factor of heterosexual contact and IV drug use. Initial viral load on 06/23/19 23,300 and CD4 count 577. Genotype with no significant drug resistance. Entered care at [facility] Stage 1. [id number] negative. No history of opportunistic infection. Sole medication regimen of Biktarvy  Subjective:    Chief Complaint  Patient presents with   Follow-up    B20 - c/o runny nose, sneezing cough with mucus x 1 week.     HPI:  Kristin Hart is a 41 y.o. female with HIV diease last seen on 10/24/21 with well controlled virus and good adherence and tolerance to [employer]. Viral load was undetectable and CD4 count 1054. Social situation remained labile and working with Beatrix Fetters with [facility]. Here today for follow up.  Ms. Herzberg has not been doing very well since her last office visit. Feeling bad today with on going cough for about a week and new onset congestion. No sore throat, fevers or chills. Has scheduled colposcopy coming up on Wednesday. House where she is staying may be boarded up soon. Has been to the [facility] and [facility].    No Known Allergies    Outpatient Medications Prior to Visit  Medication Sig Dispense Refill   bictegravir-emtricitabine-tenofovir AF (BIKTARVY) 50-200-25 MG TABS tablet Take 1 tablet by mouth daily. 30 tablet 5   cetirizine (ZYRTEC) 10 MG tablet TAKE 1 TABLET BY MOUTH EVERY DAY 30 tablet 11   diclofenac Sodium (VOLTAREN) 1 % GEL Apply 2 g topically 4 (four) times daily as needed (for elbow pain). As needed for hip pain. 2 g 3   hydrocortisone cream 1 % Apply to affected area 2 times daily 15 g 0   omeprazole (PRILOSEC) 40 MG capsule Take 1 capsule (40 mg total) by mouth daily. 30 capsule 3   valACYclovir (VALTREX) 500 MG tablet Take 1 tablet (500 mg total) by mouth 2 (two) times  daily. 60 tablet 2   cyclobenzaprine (FLEXERIL) 5 MG tablet Take 1 tablet (5 mg total) by mouth at bedtime. (Patient not taking: Reported on 10/24/2021) 10 tablet 0   furosemide (LASIX) 20 MG tablet TAKE 1 TABLET (20 MG TOTAL) BY MOUTH DAILY AS NEEDED FOR EDEMA. (Patient not taking: Reported on 10/24/2021) 20 tablet 0   gabapentin (NEURONTIN) 100 MG capsule TAKE 1 CAPSULE BY MOUTH TWICE A DAY (Patient not taking: Reported on 10/24/2021) 30 capsule 0   hydrochlorothiazide (HYDRODIURIL) 12.5 MG tablet Take 1 tablet (12.5 mg total) by mouth daily. (Patient not taking: Reported on 11/27/2021) 30 tablet 2   hydrOXYzine (ATARAX) 25 MG tablet Take 1 tablet (25 mg total) by mouth every 6 (six) hours. (Patient not taking: Reported on 10/24/2021) 12 tablet 0   ibuprofen (ADVIL) 600 MG tablet Take 1 tablet (600 mg total) by mouth every 6 (six) hours as needed. (Patient not taking: Reported on 10/24/2021) 30 tablet 0   Menthol, Topical Analgesic, (ICY HOT EX) Apply 1 application topically 2 (two) times daily as needed (pain). (Patient not taking: Reported on 10/24/2021)     naproxen (NAPROSYN) 500 MG tablet Take 1 tablet (500 mg total) by mouth daily as needed for moderate pain. (Patient not taking: Reported on 10/24/2021) 30 tablet 0   ondansetron (ZOFRAN-ODT) 4 MG disintegrating tablet 4mg  ODT q4 hours prn  nausea/vomit (Patient not taking: Reported on 10/24/2021) 20 tablet 0   topiramate (TOPAMAX) 25 MG tablet TAKE 1 TABLET (25 MG TOTAL) BY MOUTH DAILY. (Patient not taking: Reported on 10/24/2021) 30 tablet 1   VENTOLIN HFA 108 (90 Base) MCG/ACT inhaler INHALE 1 PUFF INTO THE LUNGS EVERY 4 HOURS AS NEEDED. (Patient not taking: Reported on 10/24/2021) 18 each 2   doxycycline (VIBRAMYCIN) 100 MG capsule Take 1 capsule (100 mg total) by mouth 2 (two) times daily. (Patient not taking: Reported on 10/24/2021) 19 capsule 0   No facility-administered medications prior to visit.     Past Medical History:  Diagnosis Date    Asthma    Borderline personality disorder ([place])    with schizophrenic tendancies, per pt   Diabetes mellitus without complication ([place])    type 2   Foreign body in small intestine    HIV (human immunodeficiency virus infection) ([place])    Hypothyroidism    Stomach ulcer      Past Surgical History:  Procedure Laterality Date   CESAREAN SECTION N/A 07/04/2016   Procedure: CESAREAN SECTION;  Surgeon: Truett Mainland, DO;  Location: [place];  Service: Obstetrics;  Laterality: N/A;   ENTEROSCOPY N/A 11/08/2017   Procedure: ENTEROSCOPY;  Surgeon: Irene Shipper, MD;  Location: [facility] END[facility]COPY;  Service: Endoscopy;  Laterality: N/A;   NO PAST SURGERIES        Review of Systems  Constitutional:  Negative for chills, diaphoresis, fatigue and fever.  HENT:  Positive for congestion. Negative for facial swelling, hearing loss, postnasal drip, sinus pressure, sinus pain, sneezing and sore throat.   Respiratory:  Positive for cough. Negative for chest tightness, shortness of breath and wheezing.   Cardiovascular:  Negative for chest pain.  Gastrointestinal:  Negative for abdominal pain, diarrhea, nausea and vomiting.      Objective:    BP 138/84   Pulse 90   Temp 98.2 F (36.8 C) (Oral)   Resp 16   Wt 173 lb 12.8 oz (78.8 kg)   SpO2 99%   BMI 27.22 kg/m  Nursing note and vital signs reviewed.  Physical Exam Constitutional:      General: She is not in acute distress.    Appearance: She is well-developed.  Cardiovascular:     Rate and Rhythm: Normal rate and regular rhythm.     Heart sounds: Normal heart sounds.  Pulmonary:     Effort: Pulmonary effort is normal.     Breath sounds: Normal breath sounds.  Skin:    General: Skin is warm and dry.  Neurological:     Mental Status: She is alert and oriented to person, place, and time.  Psychiatric:        Behavior: Behavior normal.        Thought Content: Thought content normal.        Judgment: Judgment normal.          10/24/2021    2:44 PM 08/24/2021    3:49 PM 06/12/2021    3:46 PM 04/12/2021    2:58 PM 02/16/2021    3:39 PM  Depression screen PHQ 2/9  Decreased Interest 0 0 0 0 0  Down, Depressed, Hopeless 3 1 0 2 1  PHQ - 2 Score 3 1 0 2 1  Altered sleeping 2   3   Tired, decreased energy 0   2   Change in appetite 1   2   Feeling bad or failure about yourself  3  1   Trouble concentrating 1   1   Moving slowly or fidgety/restless 0   0   Suicidal thoughts 0   0   PHQ-9 Score 10   11        Assessment & Plan:    Patient Active Problem List   Diagnosis Date Noted   Poor social situation 10/25/2021   Axillary abscess 09/20/2021   Therapeutic drug monitoring 08/24/2021   Birth control counseling 12/20/2020   Open wnd of finger 12/20/2020   Hypertension 09/21/2020   Grief 08/29/2020   Encounter for initial prescription of injectable contraceptive 07/29/2020   Left hip pain 07/17/2020   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   Screening for cervical cancer 04/21/2020   Screening for STDs (sexually transmitted diseases) 04/21/2020   Costochondritis 02/19/2020   Bilateral hip pain 02/19/2020   Elbow pain, right 12/04/2019   Housing problems 11/23/2019   Mental health disorder 10/12/2019   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   Paresthesias 10/12/2019   Healthcare maintenance 08/05/2019   HIV (human immunodeficiency virus infection) (HCC) 12/31/2018   Anxiety 05/29/2017   Self-mutilation 05/29/2017   Depression 06/07/2016   Marijuana use 06/07/2016   Low vitamin D level 03/15/2016   Prediabetes 02/22/2016   Asthma 02/22/2016   ADD (attention deficit disorder) 02/22/2016   Methamphetamine use disorder, severe, 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/22/2016   Hyperlipidemia with target LDL less than 70 01/31/2012   Migraine 01/31/2012   Tremor, 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     Problem List Items Addressed This Visit       Cardiovascular and Mediastinum   Hypertension     Blood pressure improved from previous. No current neurological/ophthalmologic signs/symptoms. Elevated blood pressure suspected related to stress and continued drug use. Continue current dose of hydrochlorothiazide.        Other   HIV (human immunodeficiency virus infection) (HCC) - Primary (Chronic)    Eulala continues to have well controlled virus with good adherence and tolerance to [employer]. Reviewed previous lab work and discussed plan of care. Continue current dose of Biktarvy.       Poor social situation    Continues to work with [facility] and being connected with available resources. Appreciate the exceptional work Kara Mead continues to help Zena with. Will continue to monitor.         I have discontinued Alvan Dame. M[place]rumb's doxycycline. I am also having her start on amoxicillin-clavulanate. Additionally, I am having her maintain her hydrocortisone cream, diclofenac Sodium, cyclobenzaprine, (Menthol, Topical Analgesic, (ICY HOT EX)), gabapentin, ibuprofen, omeprazole, cetirizine, Ventolin HFA, ondansetron, furosemide, naproxen, topiramate, hydrOXYzine, bictegravir-emtricitabine-tenofovir AF, valACYclovir, and hydrochlorothiazide.   Meds ordered this encounter  Medications   amoxicillin-clavulanate (AUGMENTIN) 875-125 MG tablet    Sig: Take 1 tablet by mouth 2 (two) times daily.    Dispense:  14 tablet    Refill:  0    Order Specific Question:   Supervising Provider    Answer:   Judyann Munson [[id number]]     Follow-up: Return in about 1 month (around 12/27/2021), or if symptoms worsen or fail to improve.   Marcos Eke, MSN, FNP-C Nurse Practitioner [facility] Main number: [phone]

## 2021-11-27 NOTE — Assessment & Plan Note (Signed)
Blood pressure improved from previous. No current neurological/ophthalmologic signs/symptoms. Elevated blood pressure suspected related to stress and continued drug use. Continue current dose of hydrochlorothiazide.

## 2021-11-27 NOTE — Patient Instructions (Signed)
Nice to see you.  Continue to take your medication daily as prescribed.  Refills have been sent to the pharmacy.  Plan for follow up in 1 months or sooner if needed   Good luck with your procedure!  Have a great day and stay safe!

## 2021-11-27 NOTE — Assessment & Plan Note (Signed)
Kristin Hart continues to have well controlled virus with good adherence and tolerance to [employer]. Reviewed previous lab work and discussed plan of care. Continue current dose of Biktarvy.

## 2021-11-27 NOTE — Assessment & Plan Note (Signed)
Continues to work with [facility] and being connected with available resources. Appreciate the exceptional work Terrence Dupont continues to help [place] with. Will continue to monitor.

## 2021-11-29 ENCOUNTER — Encounter: Payer: Self-pay | Admitting: Family Medicine

## 2021-11-29 ENCOUNTER — Ambulatory Visit (INDEPENDENT_AMBULATORY_CARE_PROVIDER_SITE_OTHER): Payer: Medicaid Other | Admitting: Family Medicine

## 2021-11-29 ENCOUNTER — Other Ambulatory Visit (HOSPITAL_COMMUNITY)
Admission: RE | Admit: 2021-11-29 | Discharge: 2021-11-29 | Disposition: A | Discharge status: Home / Self Care | Payer: Medicaid Other | Source: Ambulatory Visit | Attending: Family Medicine | Admitting: Family Medicine

## 2021-11-29 VITALS — BP 147/81 | HR 90 | Wt 171.4 lb

## 2021-11-29 DIAGNOSIS — R87612 Low grade squamous intraepithelial lesion on cytologic smear of cervix (LGSIL): Secondary | ICD-10-CM | POA: Diagnosis not present

## 2021-11-29 LAB — POCT PREGNANCY, URINE: Preg Test, Ur: NEGATIVE

## 2021-11-29 NOTE — Progress Notes (Signed)
    GYNECOLOGY OFFICE COLPOSCOPY PROCEDURE NOTE  41 y.o. G1P1000 here for colposcopy for low-grade squamous intraepithelial neoplasia (LGSIL - encompassing HPV,mild dysplasia,CIN I), HR HPV POS but 16/18/45 negative pap smear on 05/2021 (previously ASCUS, HPV NEG in 2022). Patient has HIV which is well controlled.   Discussed role for HPV in cervical dysplasia, need for surveillance.    VS: BP (!) 147/81   Pulse 90   Wt 171 lb 6.4 oz (77.7 kg)   BMI 26.85 kg/m   Gen: well appearing, NAD HEENT: no scleral icterus CV: RR Lung: Normal WOB Ext: warm well perfused  Patient gave informed written consent, time out was performed.  Placed in lithotomy position. Cervix viewed with speculum and colposcope after application of acetic acid.   Colposcopy adequate? Yes  acetowhite lesion(s) noted circumferentially around external os. Bx of 12 and 6 taken o'clock; corresponding biopsies obtained.  ECC specimen obtained. All specimens were labeled and sent to pathology.  Chaperone was present during entire procedure.   Assessment and plan: Reviewed in detail possible bx results  Patient was given post procedure instructions.  Will follow up pathology and manage accordingly; patient will be contacted with results and recommendations.  Routine preventative health maintenance measures emphasized.

## 2021-12-01 LAB — SURGICAL PATHOLOGY

## 2021-12-04 ENCOUNTER — Telehealth: Payer: Self-pay | Admitting: *Deleted

## 2021-12-04 NOTE — Telephone Encounter (Addendum)
-----   Message from Caren Macadam, MD sent at 12/01/2021  3:02 PM EDT ----- CIN 1, repeat pap in 1 year  9/25  1050  Called pt to discuss test results as she does not check Mychart messages. She did not answer. I left a VM message stating that I am calling with non-urgent test result information and asked to her to call us back.

## 2021-12-04 NOTE — Telephone Encounter (Signed)
Call placed to pt. Spoke with pt. Pt given results and recommendations per Dr. Ernestina Patches. Pt verbalized understanding and agreeable to plan of care.  Recall placed.  Colletta Maryland, RNC

## 2021-12-18 ENCOUNTER — Ambulatory Visit: Payer: Medicaid Other | Admitting: Family

## 2021-12-20 ENCOUNTER — Ambulatory Visit: Payer: Medicaid Other | Admitting: Family

## 2021-12-21 ENCOUNTER — Ambulatory Visit (INDEPENDENT_AMBULATORY_CARE_PROVIDER_SITE_OTHER): Payer: Medicaid Other | Admitting: Family

## 2021-12-21 ENCOUNTER — Other Ambulatory Visit: Payer: Self-pay

## 2021-12-21 ENCOUNTER — Encounter: Payer: Self-pay | Admitting: Family

## 2021-12-21 VITALS — BP 121/74 | HR 85 | Resp 16 | Ht 67.0 in | Wt 171.8 lb

## 2021-12-21 DIAGNOSIS — Z659 Problem related to unspecified psychosocial circumstances: Secondary | ICD-10-CM | POA: Diagnosis not present

## 2021-12-21 DIAGNOSIS — K219 Gastro-esophageal reflux disease without esophagitis: Secondary | ICD-10-CM | POA: Diagnosis not present

## 2021-12-21 DIAGNOSIS — G43009 Migraine without aura, not intractable, without status migrainosus: Secondary | ICD-10-CM

## 2021-12-21 DIAGNOSIS — M25551 Pain in right hip: Secondary | ICD-10-CM | POA: Diagnosis not present

## 2021-12-21 DIAGNOSIS — Z21 Asymptomatic human immunodeficiency virus [HIV] infection status: Secondary | ICD-10-CM

## 2021-12-21 DIAGNOSIS — J4521 Mild intermittent asthma with (acute) exacerbation: Secondary | ICD-10-CM | POA: Diagnosis not present

## 2021-12-21 DIAGNOSIS — I158 Other secondary hypertension: Secondary | ICD-10-CM | POA: Diagnosis not present

## 2021-12-21 DIAGNOSIS — Z0000 Encounter for general adult medical examination without abnormal findings: Secondary | ICD-10-CM

## 2021-12-21 DIAGNOSIS — M25552 Pain in left hip: Secondary | ICD-10-CM

## 2021-12-21 DIAGNOSIS — Z23 Encounter for immunization: Secondary | ICD-10-CM

## 2021-12-21 MED ORDER — BICTEGRAVIR-EMTRICITAB-TENOFOV 50-200-25 MG PO TABS: 1.0000 | ORAL_TABLET | Freq: Every day | ORAL | 5 refills | Status: DC

## 2021-12-21 MED ORDER — CYCLOBENZAPRINE HCL 5 MG PO TABS: 5.0000 mg | ORAL_TABLET | Freq: Every day | ORAL | 4 refills | Status: DC

## 2021-12-21 MED ORDER — DICLOFENAC SODIUM 1 % EX GEL: 2.0000 g | Freq: Four times a day (QID) | CUTANEOUS | 3 refills | Status: DC | PRN

## 2021-12-21 MED ORDER — OMEPRAZOLE 40 MG PO CPDR: 40.0000 mg | DELAYED_RELEASE_CAPSULE | Freq: Every day | ORAL | 5 refills | Status: DC

## 2021-12-21 MED ORDER — TOPIRAMATE 25 MG PO TABS: 25.0000 mg | ORAL_TABLET | Freq: Every day | ORAL | 5 refills | Status: DC

## 2021-12-21 MED ORDER — CETIRIZINE HCL 10 MG PO TABS
10.0000 mg | ORAL_TABLET | Freq: Every day | ORAL | 10 refills | Status: DC
Start: 2021-12-21 — End: 2022-05-28

## 2021-12-21 NOTE — Assessment & Plan Note (Signed)
Social situation remains primarily unchanged.  Meal provided with groceries.  Currently staying with [place] intermittently. Working with [place].

## 2021-12-21 NOTE — Assessment & Plan Note (Signed)
   Discussed importance of safe sexual practices and condom use.  Condoms and STD testing offered.  Influenza vaccine updated. 

## 2021-12-21 NOTE — Assessment & Plan Note (Signed)
Blood pressure well controlled with current dose of hydrochlorothiazide.  She has also remained 2 days off of methamphetamine which is likely contributing.  Continue current dose of hydrochlorothiazide.

## 2021-12-21 NOTE — Progress Notes (Signed)
Brief Narrative   Patient ID: Kristin Hart, female    DOB: 01-07-1981, 41 y.o.   MRN: [id number]  Kristin Hart is a 41 y/o caucasian female diagnosed with HIV disease on 01/02/19 with risk factor of heterosexual contact and IV drug use. Initial viral load on 06/23/19 23,300 and CD4 count 577. Genotype with no significant drug resistance. Entered care at [facility] Stage 1. [id number] negative. No history of opportunistic infection. Sole medication regimen of Biktarvy  Subjective:    Chief Complaint  Patient presents with   Follow-up    HPI:  Kristin Hart is a 41 y.o. female with HIV disease last seen on 11/27/2021 with well-controlled virus and good adherence and tolerance to [employer].  She was continuing to work with [place] for assistance. 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 Here today for follow up.   Kristin Hart has been doing well since her last office visit. Continues to take her Biktarvy as prescribed with no adverse side effects and no problems obtaining medication from the pharmacy. Social situation has remained mostly unchanged and continues to intermittently stay at [place] when she is able. Has not used any methamphetamine in the last 2 days and has concerns about fatigue. Continues to work with [place]. Condoms and STD testing offered. Healthcare maintenance due includes influenza vaccination.   No Known Allergies    Outpatient Medications Prior to Visit  Medication Sig Dispense Refill   hydrochlorothiazide (HYDRODIURIL) 12.5 MG tablet Take 1 tablet (12.5 mg total) by mouth daily. 30 tablet 2   hydrocortisone cream 1 % Apply to affected area 2 times daily 15 g 0   valACYclovir (VALTREX) 500 MG tablet Take 1 tablet (500 mg total) by mouth 2 (two) times daily. 60 tablet 2   bictegravir-emtricitabine-tenofovir AF (BIKTARVY) 50-200-25 MG TABS tablet Take 1 tablet by mouth daily. 30 tablet 5   cetirizine (ZYRTEC) 10 MG tablet TAKE 1  TABLET BY MOUTH EVERY DAY 30 tablet 11   diclofenac Sodium (VOLTAREN) 1 % GEL Apply 2 g topically 4 (four) times daily as needed (for elbow pain). As needed for hip pain. 2 g 3   omeprazole (PRILOSEC) 40 MG capsule Take 1 capsule (40 mg total) by mouth daily. 30 capsule 3   Menthol, Topical Analgesic, (ICY HOT EX) Apply 1 application topically 2 (two) times daily as needed (pain). (Patient not taking: Reported on 10/24/2021)     naproxen (NAPROSYN) 500 MG tablet Take 1 tablet (500 mg total) by mouth daily as needed for moderate pain. (Patient not taking: Reported on 10/24/2021) 30 tablet 0   VENTOLIN HFA 108 (90 Base) MCG/ACT inhaler INHALE 1 PUFF INTO THE LUNGS EVERY 4 HOURS AS NEEDED. (Patient not taking: Reported on 10/24/2021) 18 each 2   amoxicillin-clavulanate (AUGMENTIN) 875-125 MG tablet Take 1 tablet by mouth 2 (two) times daily. (Patient not taking: Reported on 11/29/2021) 14 tablet 0   cyclobenzaprine (FLEXERIL) 5 MG tablet Take 1 tablet (5 mg total) by mouth at bedtime. (Patient not taking: Reported on 10/24/2021) 10 tablet 0   furosemide (LASIX) 20 MG tablet TAKE 1 TABLET (20 MG TOTAL) BY MOUTH DAILY AS NEEDED FOR EDEMA. (Patient not taking: Reported on 10/24/2021) 20 tablet 0   gabapentin (NEURONTIN) 100 MG capsule TAKE 1 CAPSULE BY MOUTH TWICE A DAY (Patient not taking: Reported on 10/24/2021) 30 capsule 0   hydrOXYzine (ATARAX) 25 MG tablet Take 1 tablet (25 mg total) by  mouth every 6 (six) hours. (Patient not taking: Reported on 10/24/2021) 12 tablet 0   ibuprofen (ADVIL) 600 MG tablet Take 1 tablet (600 mg total) by mouth every 6 (six) hours as needed. (Patient not taking: Reported on 10/24/2021) 30 tablet 0   ondansetron (ZOFRAN-ODT) 4 MG disintegrating tablet 4mg  ODT q4 hours prn nausea/vomit (Patient not taking: Reported on 10/24/2021) 20 tablet 0   topiramate (TOPAMAX) 25 MG tablet TAKE 1 TABLET (25 MG TOTAL) BY MOUTH DAILY. (Patient not taking: Reported on 10/24/2021) 30 tablet 1   No  facility-administered medications prior to visit.     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, per pt   Diabetes mellitus without complication (HCC)    type 2   Foreign body in small intestine    HIV (human immunodeficiency virus infection) (HCC)    Hypothyroidism    Stomach ulcer      Past Surgical History:  Procedure Laterality Date   CESAREAN SECTION N/A 07/04/2016   Procedure: CESAREAN SECTION;  Surgeon: 07/06/2016, DO;  Location: [facility] BIRTHING SUITES;  Service: Obstetrics;  Laterality: N/A;   ENTEROSCOPY N/A 11/08/2017   Procedure: ENTEROSCOPY;  Surgeon: 11/10/2017, MD;  Location: [facility] ENDOSCOPY;  Service: Endoscopy;  Laterality: N/A;   NO PAST SURGERIES        Review of Systems  Constitutional:  Negative for appetite change, chills, diaphoresis, fatigue, fever and unexpected weight change.  Eyes:        Negative for acute change in vision  Respiratory:  Negative for chest tightness, shortness of breath and wheezing.   Cardiovascular:  Negative for chest pain.  Gastrointestinal:  Negative for diarrhea, nausea and vomiting.  Genitourinary:  Negative for dysuria, pelvic pain and vaginal discharge.  Musculoskeletal:  Negative for neck pain and neck stiffness.  Skin:  Negative for rash.  Neurological:  Negative for seizures, syncope, weakness and headaches.  Hematological:  Negative for adenopathy. Does not bruise/bleed easily.  Psychiatric/Behavioral:  Negative for hallucinations.       Objective:    BP 121/74   Pulse 85   Resp 16   Ht 5\' 7"  (1.702 m)   Wt 171 lb 12.8 oz (77.9 kg)   LMP  (LMP Unknown) Comment: spiratic  SpO2 100%   BMI 26.91 kg/m  Nursing note and vital signs reviewed.  Physical Exam Constitutional:      General: She is not in acute distress.    Appearance: She is well-developed.  Eyes:     Conjunctiva/sclera: Conjunctivae normal.  Cardiovascular:      Rate and Rhythm: Normal rate and regular rhythm.     Heart sounds: Normal heart sounds. No murmur heard.    No friction rub. No gallop.  Pulmonary:     Effort: Pulmonary effort is normal. No respiratory distress.     Breath sounds: Normal breath sounds. No wheezing or rales.  Chest:     Chest wall: No tenderness.  Abdominal:     General: Bowel sounds are normal.     Palpations: Abdomen is soft.     Tenderness: There is no abdominal tenderness.  Musculoskeletal:     Cervical back: Neck supple.  Lymphadenopathy:     Cervical: No cervical adenopathy.  Skin:    General: Skin is warm and dry.     Findings: No rash.  Neurological:     Mental Status: She is alert and  oriented to person, place, and time.  Psychiatric:        Behavior: Behavior normal.        Thought Content: Thought content normal.        Judgment: Judgment normal.         12/21/2021    2:15 PM 11/29/2021    1:33 PM 10/24/2021    2:44 PM 08/24/2021    3:49 PM 06/12/2021    3:46 PM  Depression screen PHQ 2/9  Decreased Interest 1 1 0 0 0  Down, Depressed, Hopeless 1 1 3 1  0  PHQ - 2 Score 2 2 3 1  0  Altered sleeping 1 1 2     Tired, decreased energy 1 1 0    Change in appetite 0 0 1    Feeling bad or failure about yourself   1 3    Trouble concentrating 1 0 1    Moving slowly or fidgety/restless 0 0 0    Suicidal thoughts 0 0 0    PHQ-9 Score 5 5 10          Assessment & Plan:    Patient Active Problem List   Diagnosis Date Noted   Poor social situation 10/25/2021   Axillary abscess 09/20/2021   Therapeutic drug monitoring 08/24/2021   Birth control counseling 12/20/2020   Open wnd of finger 12/20/2020   Hypertension 09/21/2020   Grief 08/29/2020   Encounter for initial prescription of injectable contraceptive 07/29/2020   Left hip pain 07/17/2020   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   Screening for cervical cancer 04/21/2020   Screening for STDs (sexually transmitted diseases)  04/21/2020   Costochondritis 02/19/2020   Bilateral hip pain 02/19/2020   Elbow pain, right 12/04/2019   Housing problems 11/23/2019   Mental health disorder 10/12/2019   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   Paresthesias 10/12/2019   Healthcare maintenance 08/05/2019   HIV (human immunodeficiency virus infection) (HCC) 12/31/2018   Anxiety 05/29/2017   Self-mutilation 05/29/2017   Depression 06/07/2016   Marijuana use 06/07/2016   Low vitamin D level 03/15/2016   Prediabetes 02/22/2016   Asthma 02/22/2016   ADD (attention deficit disorder) 02/22/2016   Methamphetamine use disorder, severe, 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/22/2016   Hyperlipidemia with target LDL less than 70 01/31/2012   Migraine 01/31/2012   Tremor, 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     Problem List Items Addressed This Visit       Cardiovascular and Mediastinum   Migraine   Relevant Medications   cyclobenzaprine (FLEXERIL) 5 MG tablet   topiramate (TOPAMAX) 25 MG tablet   Hypertension    Blood pressure well controlled with current dose of hydrochlorothiazide.  She has also remained 2 days off of methamphetamine which is likely contributing.  Continue current dose of hydrochlorothiazide.        Respiratory   Asthma   Relevant Medications   cetirizine (ZYRTEC) 10 MG tablet     Other   HIV (human immunodeficiency virus infection) (HCC) - Primary (Chronic)    Kristin Hart continues to have well-controlled virus with good adherence and tolerance to 02/02/2012.  Reviewed previous lab work and discussed plan of care.  Will need blood work in the next 1 to 2 months.  Continue current dose of Biktarvy.  Plan for follow-up in 1 month or sooner if needed.      Relevant Medications   bictegravir-emtricitabine-tenofovir AF (BIKTARVY) 50-200-25 MG TABS tablet   Healthcare maintenance    Discussed importance of safe  sexual practices and condom use.  Condoms and STD testing offered. Influenza vaccine updated.      Bilateral hip pain    Relevant Medications   diclofenac Sodium (VOLTAREN) 1 % GEL   Left hip pain   Relevant Medications   cyclobenzaprine (FLEXERIL) 5 MG tablet   Poor social situation    Social situation remains primarily unchanged.  Meal provided with groceries.  Currently staying with [place] intermittently. Working with [facility].       Other Visit Diagnoses     Gastroesophageal reflux disease, unspecified whether esophagitis present       Relevant Medications   omeprazole (PRILOSEC) 40 MG capsule   Need for immunization against influenza       Relevant Orders   Flu Vaccine QUAD 29mo+IM (Fluarix, Fluzone & Alfiuria Quad PF) (Completed)        I have discontinued Kristin Hart. Kristin Hart's gabapentin, ibuprofen, ondansetron, furosemide, hydrOXYzine, and amoxicillin-clavulanate. I have also changed her cetirizine. Additionally, I am having her maintain her hydrocortisone cream, (Menthol, Topical Analgesic, (ICY HOT EX)), Ventolin HFA, naproxen, valACYclovir, hydrochlorothiazide, bictegravir-emtricitabine-tenofovir AF, diclofenac Sodium, cyclobenzaprine, topiramate, and omeprazole.   Meds ordered this encounter  Medications   bictegravir-emtricitabine-tenofovir AF (BIKTARVY) 50-200-25 MG TABS tablet    Sig: Take 1 tablet by mouth daily.    Dispense:  30 tablet    Refill:  5    Order Specific Question:   Supervising Provider    Answer:   Drue Second, CYNTHIA [[id number]]   diclofenac Sodium (VOLTAREN) 1 % GEL    Sig: Apply 2 g topically 4 (four) times daily as needed (for elbow pain). As needed for hip pain.    Dispense:  2 g    Refill:  3    Order Specific Question:   Supervising Provider    Answer:   Drue Second, CYNTHIA [[id number]]   cetirizine (ZYRTEC) 10 MG tablet    Sig: Take 1 tablet (10 mg total) by mouth daily.    Dispense:  30 tablet    Refill:  10    Order Specific Question:   Supervising Provider    Answer:   Drue Second, CYNTHIA [[id number]]   cyclobenzaprine (FLEXERIL) 5 MG tablet    Sig: Take 1 tablet (5 mg total) by mouth  at bedtime.    Dispense:  30 tablet    Refill:  4    Order Specific Question:   Supervising Provider    Answer:   Drue Second, CYNTHIA [[id number]]   topiramate (TOPAMAX) 25 MG tablet    Sig: Take 1 tablet (25 mg total) by mouth daily.    Dispense:  30 tablet    Refill:  5    Order Specific Question:   Supervising Provider    Answer:   Drue Second, CYNTHIA [[id number]]   omeprazole (PRILOSEC) 40 MG capsule    Sig: Take 1 capsule (40 mg total) by mouth daily.    Dispense:  30 capsule    Refill:  5    Order Specific Question:   Supervising Provider    Answer:   Judyann Munson [[id number]]     Follow-up: Return in about 1 month (around 01/21/2022), or if symptoms worsen or fail to improve.   Marcos Eke, MSN, FNP-C Nurse Practitioner [facility] for Infectious Disease [facility] Main number: [phone]

## 2021-12-21 NOTE — Patient Instructions (Signed)
Nice to see you.  Continue to take your medication daily as prescribed.  Refills have been sent to the pharmacy.  Plan for follow up in 1 months or sooner if needed with lab work on the same day.  Have a great day and stay safe!  

## 2021-12-21 NOTE — Assessment & Plan Note (Signed)
Kristin Hart continues to have well-controlled virus with good adherence and tolerance to [employer].  Reviewed previous lab work and discussed plan of care.  Will need blood work in the next 1 to 2 months.  Continue current dose of Biktarvy.  Plan for follow-up in 1 month or sooner if needed.

## 2021-12-27 ENCOUNTER — Other Ambulatory Visit: Payer: Self-pay

## 2021-12-27 MED ORDER — VALACYCLOVIR HCL 500 MG PO TABS: 500.0000 mg | ORAL_TABLET | Freq: Two times a day (BID) | ORAL | 2 refills | Status: DC

## 2022-01-13 ENCOUNTER — Other Ambulatory Visit: Payer: Self-pay | Admitting: Family

## 2022-01-13 DIAGNOSIS — R030 Elevated blood-pressure reading, without diagnosis of hypertension: Secondary | ICD-10-CM

## 2022-01-19 ENCOUNTER — Other Ambulatory Visit: Payer: Self-pay | Admitting: Internal Medicine

## 2022-01-19 MED ORDER — DOXYCYCLINE HYCLATE 100 MG PO TABS: 100.0000 mg | ORAL_TABLET | Freq: Two times a day (BID) | ORAL | 0 refills | Status: AC

## 2022-01-19 MED ORDER — FLUCONAZOLE 150 MG PO TABS: 150.0000 mg | ORAL_TABLET | Freq: Once | ORAL | 0 refills | Status: AC

## 2022-01-22 ENCOUNTER — Telehealth: Payer: Self-pay

## 2022-01-22 NOTE — Telephone Encounter (Signed)
Patient returned call and stated she is feeling much better after starting the doxy. Patient already has a follow up with Judeth Cornfield on Thursday.  Dinero Chavira T [name]

## 2022-01-22 NOTE — Telephone Encounter (Signed)
-----   Message from Kathlynn Grate, DO sent at 01/19/2022  7:50 PM EST ----- Hi Team, I was called by this patient tonight about a bunch of boils/carbuncles under her arm that had developed.  I sent her in doxycycline x 5days as she did not want to go to the UC/ED and probably was not necessary for her to do so.  Can you check in with her on Monday and offer follow up appt with someone if needed?  Thanks, Greig Castilla

## 2022-01-25 ENCOUNTER — Encounter: Payer: Self-pay | Admitting: Infectious Diseases

## 2022-01-25 ENCOUNTER — Ambulatory Visit (INDEPENDENT_AMBULATORY_CARE_PROVIDER_SITE_OTHER): Payer: Medicaid Other | Admitting: Infectious Diseases

## 2022-01-25 ENCOUNTER — Other Ambulatory Visit (HOSPITAL_COMMUNITY)
Admission: RE | Admit: 2022-01-25 | Discharge: 2022-01-25 | Disposition: A | Discharge status: Home / Self Care | Payer: Medicaid Other | Source: Ambulatory Visit | Attending: Infectious Diseases | Admitting: Infectious Diseases

## 2022-01-25 ENCOUNTER — Other Ambulatory Visit: Payer: Self-pay

## 2022-01-25 VITALS — BP 131/69 | HR 105 | Temp 97.8°F | Ht 67.5 in | Wt 170.0 lb

## 2022-01-25 DIAGNOSIS — Z113 Encounter for screening for infections with a predominantly sexual mode of transmission: Secondary | ICD-10-CM

## 2022-01-25 DIAGNOSIS — Z3042 Encounter for surveillance of injectable contraceptive: Secondary | ICD-10-CM

## 2022-01-25 DIAGNOSIS — Z21 Asymptomatic human immunodeficiency virus [HIV] infection status: Secondary | ICD-10-CM | POA: Diagnosis not present

## 2022-01-25 DIAGNOSIS — Z3202 Encounter for pregnancy test, result negative: Secondary | ICD-10-CM

## 2022-01-25 DIAGNOSIS — Z30013 Encounter for initial prescription of injectable contraceptive: Secondary | ICD-10-CM

## 2022-01-25 LAB — POCT URINE PREGNANCY: Preg Test, Ur: NEGATIVE

## 2022-01-25 MED ORDER — MEDROXYPROGESTERONE ACETATE 150 MG/ML IM SUSP
150.0000 mg | Freq: Once | INTRAMUSCULAR | Status: AC
  Administered 2022-01-25: 150 mg via INTRAMUSCULAR

## 2022-01-25 NOTE — Assessment & Plan Note (Signed)
UPT negative today - depo provided.

## 2022-01-25 NOTE — Assessment & Plan Note (Signed)
Well controlled on once daily biktarvy. She likes to follow with [name] monthly.  Will give condoms, food and toiletries today.  I also gave her the number for dental team. There is no acute signs of infection in her mouth - multiple broken teeth that need extraction. She will coordinate her schedule with them and then see if she can see [name] same day for next visit.   Rtc in 1 month.

## 2022-01-25 NOTE — Progress Notes (Signed)
Brief Narrative   Patient ID: Kristin Hart, female    DOB: 10/22/1980, 41 y.o.   MRN: [id number]  Kristin Hart is a 41 y/o caucasian female diagnosed with HIV disease on 01/02/19 with risk factor of heterosexual contact and IV drug use. Initial viral load on 06/23/19 23,300 and CD4 count 577. Genotype with no significant drug resistance. Entered care at [facility] Stage 1. [id number] negative. No history of opportunistic infection. Sole medication regimen of Biktarvy  Subjective:    Chief Complaint  Patient presents with   Follow-up    HPI:  Kristin Hart is a 41 y.o. female with well controlled HIV disease with good adherence and tolerance to her ART regimen of Biktarvy.  Most recent lab work with viral load that was undetectable and CD4 count of 1150 in July of this year. Here today for follow up.   Kristin Hart continues to take her Biktarvy daily as prescribed with no adverse side effects.  Feeling OK today. Stays in close communication with her case manager, Kara Mead. Spoke with her recently. Main concern today is dental pain on the left lower mandible.   Kristin Hart has no problems obtaining medication from the pharmacy.  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  Condoms provided.  Would like to see if she is due for her depo injection today for contraception management.  She has had a pap smear this year and flu shot.  She would like to defer labs/immunizations until next visit given contraception injection today.    No Known Allergies    Outpatient Medications Prior to Visit  Medication Sig Dispense Refill   bictegravir-emtricitabine-tenofovir AF (BIKTARVY) 50-200-25 MG TABS tablet Take 1 tablet by mouth daily. 30 tablet 5   cetirizine (ZYRTEC) 10 MG tablet Take 1 tablet (10 mg total) by mouth daily. 30 tablet 10   cyclobenzaprine (FLEXERIL) 5 MG tablet Take 1 tablet (5 mg total) by mouth at bedtime. 30 tablet 4   diclofenac Sodium (VOLTAREN) 1  % GEL Apply 2 g topically 4 (four) times daily as needed (for elbow pain). As needed for hip pain. 2 g 3   hydrochlorothiazide (HYDRODIURIL) 12.5 MG tablet TAKE 1 TABLET(12.5 MG) BY MOUTH DAILY 90 tablet 1   hydrocortisone cream 1 % Apply to affected area 2 times daily 15 g 0   naproxen (NAPROSYN) 500 MG tablet Take 1 tablet (500 mg total) by mouth daily as needed for moderate pain. 30 tablet 0   omeprazole (PRILOSEC) 40 MG capsule Take 1 capsule (40 mg total) by mouth daily. 30 capsule 5   topiramate (TOPAMAX) 25 MG tablet Take 1 tablet (25 mg total) by mouth daily. 30 tablet 5   valACYclovir (VALTREX) 500 MG tablet Take 1 tablet (500 mg total) by mouth 2 (two) times daily. 60 tablet 2   VENTOLIN HFA 108 (90 Base) MCG/ACT inhaler INHALE 1 PUFF INTO THE LUNGS EVERY 4 HOURS AS NEEDED. 18 each 2   Menthol, Topical Analgesic, (ICY HOT EX) Apply 1 application topically 2 (two) times daily as needed (pain). (Patient not taking: Reported on 10/24/2021)     No facility-administered medications prior to visit.     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, per pt   Diabetes mellitus without complication (HCC)    type 2   Foreign body in small intestine    HIV (human  immunodeficiency virus infection) (HCC)    Hypothyroidism    Stomach ulcer      Past Surgical History:  Procedure Laterality Date   CESAREAN SECTION N/A 07/04/2016   Procedure: CESAREAN SECTION;  Surgeon: [name], DO;  Location: [facility] BIRTHING SUITES;  Service: Obstetrics;  Laterality: N/A;   ENTEROSCOPY N/A 11/08/2017   Procedure: ENTEROSCOPY;  Surgeon: Hilarie Fredrickson, MD;  Location: [facility] ENDOSCOPY;  Service: Endoscopy;  Laterality: N/A;   NO PAST SURGERIES        Review of Systems  Constitutional:  Negative for appetite change, chills, diaphoresis, fatigue, fever and unexpected weight change.  Eyes:        Negative for acute change in vision   Respiratory:  Negative for chest tightness, shortness of breath and wheezing.   Cardiovascular:  Negative for chest pain.  Gastrointestinal:  Negative for diarrhea, nausea and vomiting.  Genitourinary:  Negative for dysuria, pelvic pain and vaginal discharge.  Musculoskeletal:  Negative for neck pain and neck stiffness.  Skin:  Negative for rash.  Neurological:  Negative for seizures, syncope, weakness and headaches.  Hematological:  Negative for adenopathy. Does not bruise/bleed easily.  Psychiatric/Behavioral:  Negative for hallucinations.       Objective:    BP 131/69   Pulse (!) 105   Temp 97.8 F (36.6 C) (Temporal)   Ht 5' 7.5" (1.715 m)   Wt 170 lb (77.1 kg)   SpO2 97%   BMI 26.23 kg/m  Nursing note and vital signs reviewed.  Physical Exam Constitutional:      General: She is not in acute distress.    Appearance: She is well-developed.  Eyes:     Conjunctiva/sclera: Conjunctivae normal.  Cardiovascular:     Rate and Rhythm: Normal rate and regular rhythm.     Heart sounds: Normal heart sounds. No murmur heard.    No friction rub. No gallop.  Pulmonary:     Effort: Pulmonary effort is normal. No respiratory distress.     Breath sounds: Normal breath sounds. No wheezing or rales.  Chest:     Chest wall: No tenderness.  Abdominal:     General: Bowel sounds are normal.     Palpations: Abdomen is soft.     Tenderness: There is no abdominal tenderness.  Musculoskeletal:     Cervical back: Neck supple.  Lymphadenopathy:     Cervical: No cervical adenopathy.  Skin:    General: Skin is warm and dry.     Findings: No rash.  Neurological:     Mental Status: She is alert and oriented to person, place, and time.  Psychiatric:        Behavior: Behavior normal.        Thought Content: Thought content normal.        Judgment: Judgment normal.         01/25/2022    3:18 PM 12/21/2021    2:15 PM 11/29/2021    1:33 PM 10/24/2021    2:44 PM 08/24/2021    3:49 PM   Depression screen PHQ 2/9  Decreased Interest 0 1 1 0 0  Down, Depressed, Hopeless 1 1 1 3 1   PHQ - 2 Score 1 2 2 3 1   Altered sleeping  1 1 2    Tired, decreased energy  1 1 0   Change in appetite  0 0 1   Feeling bad or failure about yourself    1 3   Trouble concentrating  1 0 1  Moving slowly or fidgety/restless  0 0 0   Suicidal thoughts  0 0 0   PHQ-9 Score  5 5 10         Assessment & Plan:    Patient Active Problem List   Diagnosis Date Noted   Poor social situation 10/25/2021   Axillary abscess 09/20/2021   Therapeutic drug monitoring 08/24/2021   Birth control counseling 12/20/2020   Open wnd of finger 12/20/2020   Hypertension 09/21/2020   Grief 08/29/2020   Encounter for initial prescription of injectable contraceptive 07/29/2020   Left hip pain 07/17/2020   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   Screening for cervical cancer 04/21/2020   Screening for STDs (sexually transmitted diseases) 04/21/2020   Costochondritis 02/19/2020   Bilateral hip pain 02/19/2020   Elbow pain, right 12/04/2019   Housing problems 11/23/2019   Mental health disorder 10/12/2019   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   Paresthesias 10/12/2019   Healthcare maintenance 08/05/2019   HIV (human immunodeficiency virus infection) (HCC) 12/31/2018   Anxiety 05/29/2017   Self-mutilation 05/29/2017   Depression 06/07/2016   Marijuana use 06/07/2016   Low vitamin D level 03/15/2016   Prediabetes 02/22/2016   Asthma 02/22/2016   ADD (attention deficit disorder) 02/22/2016   Methamphetamine use disorder, severe, 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/22/2016   Hyperlipidemia with target LDL less than 70 01/31/2012   Migraine 01/31/2012   Tremor, 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     Problem List Items Addressed This Visit       Unprioritized   HIV (human immunodeficiency virus infection) (HCC) (Chronic)    Well controlled on once daily biktarvy. She likes to follow with 02/02/2012 monthly.  Will  give condoms, food and toiletries today.  I also gave her the number for dental team. There is no acute signs of infection in her mouth - multiple broken teeth that need extraction. She will coordinate her schedule with them and then see if she can see [name] same day for next visit.   Rtc in 1 month.       Encounter for initial prescription of injectable contraceptive    UPT negative today - depo provided.       Relevant Orders   POCT urine pregnancy   Other Visit Diagnoses     Routine screening for STI (sexually transmitted infection)    -  Primary   Relevant Orders   Urine cytology ancillary only       I am having [name]. [place] maintain her hydrocortisone cream, (Menthol, Topical Analgesic, (ICY HOT EX)), Ventolin HFA, naproxen, bictegravir-emtricitabine-tenofovir AF, diclofenac Sodium, cetirizine, cyclobenzaprine, topiramate, omeprazole, valACYclovir, and hydrochlorothiazide.   No orders of the defined types were placed in this encounter.   Alvan Dame, MSN, NP-C [facility] for Infectious Disease [facility]  [email or web address] Pager: [phone] Office: [phone] [facility] Main Line: [phone] *Secure Chat Communication Welcome

## 2022-01-25 NOTE — Patient Instructions (Addendum)
Nice to see you!  Keep taking your biktarvy.   Schedule a follow up visit with [name] in 1-2 months.   Please call Claris Che @ [phone] to schedule a dental appointment

## 2022-01-26 LAB — URINE CYTOLOGY ANCILLARY ONLY
Chlamydia: NEGATIVE
Comment: NEGATIVE
Comment: NEGATIVE
Comment: NORMAL
Neisseria Gonorrhea: NEGATIVE
Trichomonas: NEGATIVE

## 2022-02-27 ENCOUNTER — Telehealth: Payer: Self-pay

## 2022-02-27 NOTE — Telephone Encounter (Signed)
Kristin Hart can take 2 tablets daily for the next few days and if her blood pressure remains elevated please have her schedule an appointment before she see's Judeth Cornfield in January.

## 2022-02-27 NOTE — Telephone Encounter (Signed)
Patient called stating that she needs her bp medication hydrochlorothiazide increased because for the past week her bp has been high. Please advise.

## 2022-02-27 NOTE — Telephone Encounter (Signed)
Advised patient to take 2 tablets of hydrochlorothiazide  daily for the next 3 days. If blood pressure remain elevated contact [facility] to schedule a soon follow up visit.   Valarie Cones, LPN

## 2022-03-06 ENCOUNTER — Ambulatory Visit (HOSPITAL_COMMUNITY)
Admission: EM | Admit: 2022-03-06 | Discharge: 2022-03-06 | Disposition: A | Discharge status: Home / Self Care | Payer: Medicaid Other | Attending: Emergency Medicine | Admitting: Emergency Medicine

## 2022-03-06 ENCOUNTER — Encounter (HOSPITAL_COMMUNITY): Payer: Self-pay | Admitting: *Deleted

## 2022-03-06 DIAGNOSIS — K047 Periapical abscess without sinus: Secondary | ICD-10-CM

## 2022-03-06 DIAGNOSIS — E11638 Type 2 diabetes mellitus with other oral complications: Secondary | ICD-10-CM

## 2022-03-06 DIAGNOSIS — K029 Dental caries, unspecified: Secondary | ICD-10-CM | POA: Diagnosis not present

## 2022-03-06 DIAGNOSIS — E1165 Type 2 diabetes mellitus with hyperglycemia: Secondary | ICD-10-CM | POA: Diagnosis not present

## 2022-03-06 LAB — CBG MONITORING, ED: Glucose-Capillary: 397 mg/dL — ABNORMAL HIGH (ref 70–99)

## 2022-03-06 MED ORDER — FLUCONAZOLE 150 MG PO TABS: ORAL_TABLET | ORAL | 0 refills | Status: DC

## 2022-03-06 MED ORDER — AMOXICILLIN-POT CLAVULANATE 875-125 MG PO TABS: 1.0000 | ORAL_TABLET | Freq: Two times a day (BID) | ORAL | 0 refills | Status: DC

## 2022-03-06 MED ORDER — GLIPIZIDE 5 MG PO TABS: 10.0000 mg | ORAL_TABLET | Freq: Every day | ORAL | 0 refills | Status: DC

## 2022-03-06 MED ORDER — GLIPIZIDE 2.5 MG PO TABS: 10.0000 mg | ORAL_TABLET | Freq: Every day | ORAL | 0 refills | Status: DC

## 2022-03-06 MED ORDER — IBUPROFEN 800 MG PO TABS
800.0000 mg | ORAL_TABLET | Freq: Once | ORAL | Status: AC
  Administered 2022-03-06: 800 mg via ORAL

## 2022-03-06 MED ORDER — IBUPROFEN 800 MG PO TABS
ORAL_TABLET | ORAL | Status: AC
  Filled 2022-03-06: qty 1

## 2022-03-06 MED ORDER — ACETAMINOPHEN 500 MG PO TABS: 500.0000 mg | ORAL_TABLET | Freq: Four times a day (QID) | ORAL | 0 refills | Status: AC | PRN

## 2022-03-06 NOTE — Discharge Instructions (Addendum)
Augmentin has been sent to the pharmacy, you will take this 2 times a day for the next 7 days.  Please make sure to finish all of the antibiotics even if symptoms improve before completion.  Please avoid taking this medication on an empty stomach.    Diflucan has been sent to the pharmacy in the case that you develop a yeast infection after the antimicrobial treatment.  Glipizide has been sent for the diabetes control, you will take 1 tablet daily.  I recommend that she begin checking your blood sugars at home.   Please go ahead and schedule follow-up with your PCP (infectious disease).

## 2022-03-06 NOTE — ED Provider Notes (Addendum)
[facility]    CSN: [id number] Arrival date & time: 03/06/22  1026      History   Chief Complaint Chief Complaint  Patient presents with   Dental Pain    HPI Kristin Hart is a 41 y.o. female.  Patient presents complaining of right upper dental pain that started approximately a few months ago.  Patient reported that yesterday she noticed right-sided facial swelling and increased pain.  She denies any drainage from the site.  She reports that she has used ibuprofen with some relief of symptoms.  She has a history of HIV infection and she states that she follows up with infectious disease, she states that they also function as her PCP.   Reports that history of diabetes mellitus.  She reports that she does have increased thirst and polyuria.  She states that she she used to take metformin but has not taken this medicine in a while.    Dental Pain Associated symptoms: no fever     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, per pt   Diabetes mellitus without complication (HCC)    type 2   Foreign body in small intestine    HIV (human immunodeficiency virus infection) (HCC)    Hypothyroidism    Stomach ulcer     Patient Active Problem List   Diagnosis Date Noted   Poor social situation 10/25/2021   Axillary abscess 09/20/2021   Therapeutic drug monitoring 08/24/2021   Birth control counseling 12/20/2020   Open wnd of finger 12/20/2020   Hypertension 09/21/2020   Grief 08/29/2020   Encounter for initial prescription of injectable contraceptive 07/29/2020   Left hip pain 07/17/2020   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   Screening for cervical cancer 04/21/2020   Screening for STDs (sexually transmitted diseases) 04/21/2020   Costochondritis 02/19/2020   Bilateral hip pain 02/19/2020   Elbow pain, right 12/04/2019   Housing problems 11/23/2019   Mental  health disorder 10/12/2019   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   Paresthesias 10/12/2019   Healthcare maintenance 08/05/2019   HIV (human immunodeficiency virus infection) (HCC) 12/31/2018   Anxiety 05/29/2017   Self-mutilation 05/29/2017   Depression 06/07/2016   Marijuana use 06/07/2016   Low vitamin D level 03/15/2016   Prediabetes 02/22/2016   Asthma 02/22/2016   ADD (attention deficit disorder) 02/22/2016   Methamphetamine use disorder, severe, 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/22/2016   Hyperlipidemia with target LDL less than 70 01/31/2012   Migraine 01/31/2012   Tremor, 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    Past Surgical History:  Procedure Laterality Date   CESAREAN SECTION N/A 07/04/2016   Procedure: CESAREAN SECTION;  Surgeon: [name], DO;  Location: [facility];  Service: Obstetrics;  Laterality: N/A;   ENTEROSCOPY N/A 11/08/2017   Procedure: ENTEROSCOPY;  Surgeon: Hilarie Fredrickson, MD;  Location: [facility] ENDOSCOPY;  Service: Endoscopy;  Laterality: N/A;   NO PAST SURGERIES      OB History     Gravida  1   Para  1   Term  1   Preterm      AB      Living         SAB      IAB      Ectopic      Multiple  0   Live Births  Home Medications    Prior to Admission medications   Medication Sig Start Date End Date Taking? Authorizing Provider  acetaminophen (TYLENOL) 500 MG tablet Take 1 tablet (500 mg total) by mouth every 6 (six) hours as needed. 03/06/22  Yes Debby Freiberg, NP  amoxicillin-clavulanate (AUGMENTIN) 875-125 MG tablet Take 1 tablet by mouth every 12 (twelve) hours. 03/06/22  Yes Debby Freiberg, NP  bictegravir-emtricitabine-tenofovir AF (BIKTARVY) 50-200-25 MG TABS tablet Take 1 tablet by mouth daily. 12/21/21  Yes Veryl Speak, FNP  cetirizine (ZYRTEC) 10 MG tablet Take 1 tablet (10 mg total) by mouth daily. 12/21/21  Yes Veryl Speak, FNP  diclofenac Sodium (VOLTAREN) 1 % GEL Apply 2 g topically 4 (four) times daily  as needed (for elbow pain). As needed for hip pain. 12/21/21  Yes Veryl Speak, FNP  fluconazole (DIFLUCAN) 150 MG tablet Take 1 tablet today and 1 tablet 3 days from initiation. 03/06/22  Yes Debby Freiberg, NP  glipiZIDE (GLUCOTROL) 5 MG tablet Take 2 tablets (10 mg total) by mouth daily before breakfast. 03/06/22  Yes Debby Freiberg, NP  hydrochlorothiazide (HYDRODIURIL) 12.5 MG tablet TAKE 1 TABLET(12.5 MG) BY MOUTH DAILY 01/15/22  Yes Veryl Speak, FNP  hydrocortisone cream 1 % Apply to affected area 2 times daily 08/17/18  Yes Henderly, Britni A, PA-C  naproxen (NAPROSYN) 500 MG tablet Take 1 tablet (500 mg total) by mouth daily as needed for moderate pain. 07/25/21  Yes Veryl Speak, FNP  omeprazole (PRILOSEC) 40 MG capsule Take 1 capsule (40 mg total) by mouth daily. 12/21/21  Yes Veryl Speak, FNP  topiramate (TOPAMAX) 25 MG tablet Take 1 tablet (25 mg total) by mouth daily. 12/21/21  Yes Veryl Speak, FNP  valACYclovir (VALTREX) 500 MG tablet Take 1 tablet (500 mg total) by mouth 2 (two) times daily. 12/27/21  Yes Veryl Speak, FNP  VENTOLIN HFA 108 (90 Base) MCG/ACT inhaler INHALE 1 PUFF INTO THE LUNGS EVERY 4 HOURS AS NEEDED. 05/31/21  Yes Lilland, Alana, DO  cyclobenzaprine (FLEXERIL) 5 MG tablet Take 1 tablet (5 mg total) by mouth at bedtime. 12/21/21   Veryl Speak, FNP    Family History History reviewed. No pertinent family history.  Social History Social History   Tobacco Use   Smoking status: Never   Smokeless tobacco: Never  Vaping Use   Vaping Use: Never used  Substance Use Topics   Alcohol use: No   Drug use: Yes    Types: Methamphetamines    Comment: states she does Crystal almost daily     Allergies   Patient has no known allergies.   Review of Systems Review of Systems  Constitutional:  Negative for activity change, appetite change, chills, fatigue and fever.  HENT:  Positive for dental problem. Negative for sore  throat and trouble swallowing.   Respiratory: Negative.    Cardiovascular: Negative.   Gastrointestinal: Negative.   Endocrine: Positive for polydipsia and polyuria. Negative for polyphagia.     Physical Exam Triage Vital Signs ED Triage Vitals  Enc Vitals Group     BP 03/06/22 1315 (!) 138/94     Pulse Rate 03/06/22 1315 90     Resp 03/06/22 1315 18     Temp 03/06/22 1315 98.5 F (36.9 C)     Temp Source 03/06/22 1315 Oral     SpO2 03/06/22 1315 100 %     Weight --      Height --  Head Circumference --      Peak Flow --      Pain Score 03/06/22 1312 10     Pain Loc --      Pain Edu? --      Excl. in GC? --    No data found.  Updated Vital Signs BP (!) 138/94 (BP Location: Left Arm)   Pulse 90   Temp 98.5 F (36.9 C) (Oral)   Resp 18   SpO2 100%     Physical Exam Vitals and nursing note reviewed.  Constitutional:      Appearance: Normal appearance.  HENT:     Mouth/Throat:     Mouth: Mucous membranes are moist.     Dentition: Abnormal dentition. Dental tenderness, gingival swelling, dental caries and dental abscesses present.     Pharynx: Oropharynx is clear. Uvula midline. No pharyngeal swelling, oropharyngeal exudate, posterior oropharyngeal erythema or uvula swelling.     Tonsils: No tonsillar exudate or tonsillar abscesses. 0 on the right. 0 on the left.      Comments: Multiple dental abnormalities with plaque buildup and decay appearance on upper and lower row of teeth.   Abscess noted on RT upper sided of row of teeth.  Neurological:     Mental Status: She is alert.      UC Treatments / Results  Labs (all labs ordered are listed, but only abnormal results are displayed) Labs Reviewed  CBG MONITORING, ED - Abnormal; Notable for the following components:      Result Value   Glucose-Capillary 397 (*)    All other components within normal limits    EKG   Radiology No results found.  Procedures Procedures (including critical care  time)  Medications Ordered in UC Medications  ibuprofen (ADVIL) tablet 800 mg (800 mg Oral Given 03/06/22 1410)    Initial Impression / Assessment and Plan / UC Course  I have reviewed the triage vital signs and the nursing notes.  Pertinent labs & imaging results that were available during my care of the patient were reviewed by me and considered in my medical decision making (see chart for details).     She was evaluated for dental abscess and caries.  Augmentin prescription was sent to the pharmacy.  Diflucan sent due to patient request, due to yeast infection side effects of antimicrobial.  Patient was made aware of medication regiment.  She was given information for low-cost dental resources.  She was made aware that she will need to follow-up with a dentist.  She was made aware of the risk factors of dental infections, especially in regard to developing heart problems if this is untreated.  Tylenol was sent for pain management.  This writer was unable to send ibuprofen due to potential long-term effects with taking the antiviral medication. Patient verbalized understanding of instructions.  Patient was evaluated for type 2 diabetes mellitus with hyperglycemia.  Upon review of chart, this writer noted a history of hyperglycemia, BMP on 09/29/2021 had a glucose of 443.  Patient reported polyuria and polydipsia that has been ongoing for a while per patient statement.  CBG of 397 was noted in office.  Patient reports a history of diabetes mellitus, she states that due to life circumstances, her infectious disease provider has agreed to follow-up with her on primary care issues.  She stated that she was on metformin previously, this writer was unable to send metformin due to contraindications with antiviral medicine for HIV.  Glipizide 5 mg was sent,  patient was made aware of potential side effects of this medicine.  She was encouraged to purchase a glucometer, she was made aware of places where she  can purchase for low cost.  She was educated on signs of hypoglycemia and was educated on what to do if this occurs.  She was made aware that she will need to follow-up with her infectious disease doctor who accepts her PCP as soon as possible.  This writer wanted to draw a repeat CMP and CBC, patient declined this and stated that she will just follow-up with her infectious disease provider.  Last BMP on 09/29/2021 per epic, creatinine of 0.88 and GFR greater than 60.  She was educated on the importance of controlling blood sugar. Patient verbalized understanding of instructions.   Charting was provided using a a verbal dictation system, charting was proofread for errors, errors may occur which could change the meaning of the information charted.   Final Clinical Impressions(s) / UC Diagnoses   Final diagnoses:  Dental abscess  Dental caries  Type 2 diabetes mellitus with hyperglycemia, without long-term current use of insulin (HCC)     Discharge Instructions      Augmentin has been sent to the pharmacy, you will take this 2 times a day for the next 7 days.  Please make sure to finish all of the antibiotics even if symptoms improve before completion.  Please avoid taking this medication on an empty stomach.    Diflucan has been sent to the pharmacy in the case that you develop a yeast infection after the antimicrobial treatment.  Glipizide has been sent for the diabetes control, you will take 1 tablet daily.  I recommend that she begin checking your blood sugars at home.   Please go ahead and schedule follow-up with your PCP (infectious disease).        ED Prescriptions     Medication Sig Dispense Auth. Provider   amoxicillin-clavulanate (AUGMENTIN) 875-125 MG tablet Take 1 tablet by mouth every 12 (twelve) hours. 14 tablet Debby Freiberg, NP   fluconazole (DIFLUCAN) 150 MG tablet Take 1 tablet today and 1 tablet 3 days from initiation. 2 tablet Debby Freiberg, NP    acetaminophen (TYLENOL) 500 MG tablet Take 1 tablet (500 mg total) by mouth every 6 (six) hours as needed. 30 tablet Natalin Bible N, NP   glipiZIDE 2.5 MG TABS  (Status: Discontinued) Take 10 mg by mouth daily before breakfast. 30 tablet Teri Legacy N, NP   glipiZIDE (GLUCOTROL) 5 MG tablet Take 2 tablets (10 mg total) by mouth daily before breakfast. 30 tablet Debby Freiberg, NP      PDMP not reviewed this encounter.   Debby Freiberg, NP 03/06/22 1457    Debby Freiberg, NP 03/06/22 1458    Debby Freiberg, NP 03/06/22 1459    Debby Freiberg, NP 03/06/22 1500

## 2022-03-06 NOTE — ED Triage Notes (Signed)
Pt states she has had dental pain on the right upper side x months but it has started to swell since yesterday. She is taking IBU as needed she is also on Naproxen.

## 2022-03-19 ENCOUNTER — Other Ambulatory Visit: Payer: Self-pay

## 2022-03-19 ENCOUNTER — Ambulatory Visit (INDEPENDENT_AMBULATORY_CARE_PROVIDER_SITE_OTHER): Payer: Medicaid Other | Admitting: Infectious Diseases

## 2022-03-19 ENCOUNTER — Encounter: Payer: Self-pay | Admitting: Infectious Diseases

## 2022-03-19 VITALS — BP 150/101 | HR 112 | Temp 97.4°F | Ht 67.5 in | Wt 169.2 lb

## 2022-03-19 DIAGNOSIS — G43009 Migraine without aura, not intractable, without status migrainosus: Secondary | ICD-10-CM

## 2022-03-19 DIAGNOSIS — M722 Plantar fascial fibromatosis: Secondary | ICD-10-CM | POA: Diagnosis not present

## 2022-03-19 DIAGNOSIS — R7303 Prediabetes: Secondary | ICD-10-CM

## 2022-03-19 DIAGNOSIS — R030 Elevated blood-pressure reading, without diagnosis of hypertension: Secondary | ICD-10-CM

## 2022-03-19 DIAGNOSIS — F1520 Other stimulant dependence, uncomplicated: Secondary | ICD-10-CM

## 2022-03-19 DIAGNOSIS — I158 Other secondary hypertension: Secondary | ICD-10-CM

## 2022-03-19 DIAGNOSIS — R200 Anesthesia of skin: Secondary | ICD-10-CM | POA: Insufficient documentation

## 2022-03-19 DIAGNOSIS — R202 Paresthesia of skin: Secondary | ICD-10-CM

## 2022-03-19 DIAGNOSIS — Z7689 Persons encountering health services in other specified circumstances: Secondary | ICD-10-CM

## 2022-03-19 MED ORDER — TOPIRAMATE 50 MG PO TABS
50.0000 mg | ORAL_TABLET | Freq: Every day | ORAL | 2 refills | Status: DC
Start: 2022-03-19 — End: 2022-10-01

## 2022-03-19 NOTE — Assessment & Plan Note (Signed)
She reports a raynaud like phenomena. Could alternatively be due to hypoglycemia with possible unnecessary glycemic control agents vs carpal tunnel (reports the hands fall asleep time to time with activities).  Encouraged to keep warm and see if this helps. Stop antidiabetic agent.

## 2022-03-19 NOTE — Assessment & Plan Note (Signed)
Conservative measures discussed today. Will have her start volteren QID, rolling/stretching demonstrated and discussed. Ice applications and if needed boot at night to keep foot in ideal position.

## 2022-03-19 NOTE — Assessment & Plan Note (Signed)
Still with daily methamphetamine use. Discussed that this is likely contributing to her HR and blood pressure. I kindly asked her to please come without having had use before her visit next time so we can better assess her BP. Alternatively maybe she can start with spot checking at home to get better information for [place] to determine treatment.   She would probably do well with a calcium channel blocker to help with HTN, HR and possible Raynaud phenomenon  Referral to [place] discussed now that she has medicaid to help with coordinated care. She is open to this.

## 2022-03-19 NOTE — Assessment & Plan Note (Signed)
Has been on HCTZ in the past but current methamphetamine use clouds what her BP actually is. I don't want to risk over treating her. See below plan re: bp management outside of meth use.  Consider [facility]

## 2022-03-19 NOTE — Assessment & Plan Note (Signed)
Increase topamax to 50 mg QD - she will take 2 pills of current bottle and pick up new rx for larger dose. She verbalized back instructions well.

## 2022-03-19 NOTE — Progress Notes (Signed)
Brief Narrative   Patient ID: Kristin Hart, female    DOB: 1980-11-23, 42 y.o.   MRN: [id number]  Ms. Kristin Hart is a 42 y/o caucasian female diagnosed with HIV disease on 01/02/19 with risk factor of heterosexual contact and IV drug use. Initial viral load on 06/23/19 23,300 and CD4 count 577. Genotype with no significant drug resistance. Entered care at [facility] Stage 1. [id number] negative. No history of opportunistic infection. Sole medication regimen of Biktarvy  Subjective:    Chief Complaint  Patient presents with   Follow-up  Concerned over blood pressure, pulse, headaches, stomach aches, pins and needles in hands and loss of feeling, heel throbbing, stabbing pain, worried about diabetes and abscess.  Heel pain is 11/10 and throbbing/stabbing pain     HPI:  Kristin Hart is a 42 y.o. female with well controlled HIV disease with good adherence and tolerance to her ART regimen of Biktarvy.  Most recent lab work with viral load that was undetectable and CD4 count of 1150 in July of 2023. Here today for follow up with multiple concerns.   Went to [facility] for dental abscess 12/26 and finishing up her augmentin. Dental infection has resolved.   Has had right heel pain - no known injury. Has had a few different pairs of shoes she has been using, mostly sneakers, no flip flops. She reports pain is at the base of her heel. Hurts a lot first thing in the morning when she steps down and then persistent throbbing ache all day. Has been this way for about 1 m now. Silicone gel pad has helped but not so much now. Has used volteren in the past but not consistently.   Has had some tingling in the fingers that is painful. She reports that her fingers do turn white/purple/blue colors often. This has not happened before. No neck injury noted. Does do some asphyxia role play but not to the point where she passes out or could have sustained a neck injury unbeknownst to her. She started on a new diabetes  medication recently from visit with urgent care with reported history of diabetes. Developed gestational diabetes in 2018 and then last A1C in Sept 2021 was 6.6%.  Increased frequency of headaches. Topamax did help but has not followed up with family medicine practice for this as her previous provider graduated.   Lastly she is concerned about her BP and HR. She does use meth daily    No Known Allergies    Outpatient Medications Prior to Visit  Medication Sig Dispense Refill   acetaminophen (TYLENOL) 500 MG tablet Take 1 tablet (500 mg total) by mouth every 6 (six) hours as needed. 30 tablet 0   amoxicillin-clavulanate (AUGMENTIN) 875-125 MG tablet Take 1 tablet by mouth every 12 (twelve) hours. 14 tablet 0   bictegravir-emtricitabine-tenofovir AF (BIKTARVY) 50-200-25 MG TABS tablet Take 1 tablet by mouth daily. 30 tablet 5   cetirizine (ZYRTEC) 10 MG tablet Take 1 tablet (10 mg total) by mouth daily. 30 tablet 10   cyclobenzaprine (FLEXERIL) 5 MG tablet Take 1 tablet (5 mg total) by mouth at bedtime. 30 tablet 4   diclofenac Sodium (VOLTAREN) 1 % GEL Apply 2 g topically 4 (four) times daily as needed (for elbow pain). As needed for hip pain. 2 g 3   fluconazole (DIFLUCAN) 150 MG tablet Take 1 tablet today and 1 tablet 3 days from initiation. 2 tablet 0   glipiZIDE (GLUCOTROL) 5 MG tablet Take 2 tablets (  10 mg total) by mouth daily before breakfast. 30 tablet 0   hydrochlorothiazide (HYDRODIURIL) 12.5 MG tablet TAKE 1 TABLET(12.5 MG) BY MOUTH DAILY 90 tablet 1   hydrocortisone cream 1 % Apply to affected area 2 times daily 15 g 0   naproxen (NAPROSYN) 500 MG tablet Take 1 tablet (500 mg total) by mouth daily as needed for moderate pain. 30 tablet 0   omeprazole (PRILOSEC) 40 MG capsule Take 1 capsule (40 mg total) by mouth daily. 30 capsule 5   valACYclovir (VALTREX) 500 MG tablet Take 1 tablet (500 mg total) by mouth 2 (two) times daily. 60 tablet 2   VENTOLIN HFA 108 (90 Base) MCG/ACT  inhaler INHALE 1 PUFF INTO THE LUNGS EVERY 4 HOURS AS NEEDED. 18 each 2   topiramate (TOPAMAX) 25 MG tablet Take 1 tablet (25 mg total) by mouth daily. 30 tablet 5   No facility-administered medications prior to visit.     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, per pt   Diabetes mellitus without complication ([place])    type 2   Foreign body in small intestine    HIV (human immunodeficiency virus infection) ([place])    Hypothyroidism    Stomach ulcer      Past Surgical History:  Procedure Laterality Date   CESAREAN SECTION N/A 07/04/2016   Procedure: CESAREAN SECTION;  Surgeon: Truett Mainland, DO;  Location: [facility];  Service: Obstetrics;  Laterality: N/A;   ENTEROSCOPY N/A 11/08/2017   Procedure: ENTEROSCOPY;  Surgeon: Irene Shipper, MD;  Location[facility] END[facility]COPY;  Service: Endoscopy;  Laterality: N/A;   NO PAST SURGERIES        Review of Systems  Constitutional:  Negative for appetite change, chills, diaphoresis, fatigue, fever and unexpected weight change.  Eyes:        Negative for acute change in vision  Respiratory:  Negative for chest tightness, shortness of breath and wheezing.   Cardiovascular:  Negative for chest pain.  Gastrointestinal:  Negative for diarrhea, nausea and vomiting.  Genitourinary:  Negative for dysuria, pelvic pain and vaginal discharge.  Musculoskeletal:  Negative for neck pain and neck stiffness.  Skin:  Negative for rash.  Neurological:  Negative for seizures, syncope, weakness and headaches.  Hematological:  Negative for adenopathy. Does not bruise/bleed easily.  Psychiatric/Behavioral:  Negative for hallucinations.       Objective:    BP (!) 150/101   Pulse (!) 112   Temp (!) 97.4 F (36.3 C) (Temporal)   Ht 5' 7.5" (1.715 m)   Wt 169 lb 3.2 oz (76.7 kg)   BMI 26.11 kg/m  Nursing note and vital signs reviewed.   Physical Exam Constitutional:       General: She is not in acute distress.    Appearance: She is well-developed.  Eyes:     Conjunctiva/sclera: Conjunctivae normal.  Cardiovascular:     Rate and Rhythm: Normal rate and regular rhythm.     Heart sounds: Normal heart sounds. No murmur heard.    No friction rub. No gallop.  Pulmonary:     Effort: Pulmonary effort is normal. No respiratory distress.     Breath sounds: Normal breath sounds. No wheezing or rales.  Chest:     Chest wall: No tenderness.  Abdominal:     General: Bowel sounds are normal.     Palpations: Abdomen is soft.  Tenderness: There is no abdominal tenderness.  Musculoskeletal:     Cervical back: Neck supple.       Legs:     Comments: TTP on plantar aspect of heel. Aggravated by dorsiflexion.   Lymphadenopathy:     Cervical: No cervical adenopathy.  Skin:    General: Skin is warm and dry.     Findings: No rash.  Neurological:     Mental Status: She is alert and oriented to person, place, and time.  Psychiatric:        Behavior: Behavior normal.        Thought Content: Thought content normal.        Judgment: Judgment normal.         03/19/2022    3:19 PM 01/25/2022    3:18 PM 12/21/2021    2:15 PM 11/29/2021    1:33 PM 10/24/2021    2:44 PM  Depression screen PHQ 2/9  Decreased Interest 0 0 1 1 0  Down, Depressed, Hopeless 1 1 1 1 3   PHQ - 2 Score 1 1 2 2 3   Altered sleeping   1 1 2   Tired, decreased energy   1 1 0  Change in appetite   0 0 1  Feeling bad or failure about yourself     1 3  Trouble concentrating   1 0 1  Moving slowly or fidgety/restless   0 0 0  Suicidal thoughts   0 0 0  PHQ-9 Score   5 5 10        Assessment & Plan:    Patient Active Problem List   Diagnosis Date Noted   Plantar fasciitis, right 03/19/2022   Numbness and tingling in both hands 03/19/2022   Poor social situation 10/25/2021   Axillary abscess 09/20/2021   Therapeutic drug monitoring 08/24/2021   Birth control counseling 12/20/2020    Hypertension 09/21/2020   Grief 08/29/2020   Encounter for initial prescription of injectable contraceptive 07/29/2020   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   Screening for cervical cancer 04/21/2020   Screening for STDs (sexually transmitted diseases) 04/21/2020   Costochondritis 02/19/2020   Bilateral hip pain 02/19/2020   Elbow pain, right 12/04/2019   Housing problems 11/23/2019   Mental health disorder 10/12/2019   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   Paresthesias 10/12/2019   Healthcare maintenance 08/05/2019   HIV (human immunodeficiency virus infection) (HCC) 12/31/2018   Anxiety 05/29/2017   Self-mutilation 05/29/2017   Depression 06/07/2016   Marijuana use 06/07/2016   Low vitamin D level 03/15/2016   Prediabetes 02/22/2016   Asthma 02/22/2016   ADD (attention deficit disorder) 02/22/2016   Methamphetamine use disorder, severe, 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/22/2016   Hyperlipidemia with target LDL less than 70 01/31/2012   Migraine 01/31/2012   Tremor, 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     Problem List Items Addressed This Visit       Unprioritized   Hypertension    Has been on HCTZ in the past but current methamphetamine use clouds what her BP actually is. I don't want to risk over treating her. See below plan re: bp management outside of meth use.  Consider CCB       Methamphetamine use disorder, severe, dependence (HCC)    Still with daily methamphetamine use. Discussed that this is likely contributing to her HR and blood pressure. I kindly asked her to please come without having had use before her visit next time so we can better assess her  BP. Alternatively maybe she can start with spot checking at home to get better information for [place] to determine treatment.   She would probably do well with a calcium channel blocker to help with HTN, HR and possible Raynaud phenomenon  Referral to [place] discussed now that she has medicaid to help  with coordinated care. She is open to this.       Migraine    Increase topamax to 50 mg QD - she will take 2 pills of current bottle and pick up new rx for larger dose. She verbalized back instructions well.       Relevant Medications   topiramate (TOPAMAX) 50 MG tablet   Numbness and tingling in both hands    She reports a raynaud like phenomena. Could alternatively be due to hypoglycemia with possible unnecessary glycemic control agents vs carpal tunnel (reports the hands fall asleep time to time with activities).  Encouraged to keep warm and see if this helps. Stop antidiabetic agent.       Plantar fasciitis, right    Conservative measures discussed today. Will have her start volteren QID, rolling/stretching demonstrated and discussed. Ice applications and if needed boot at night to keep foot in ideal position.       Prediabetes - Primary    History of gestational DM but no A1C within the last few years. Since pregnancy in 2018 she has also lost weight. Headaches, tingling could be a sign of hypoglycemia. Will have her stop glipizide so not to risk hypoglycemia until she follows up with labs at return visit in early/mid February when she is due for depo.       I have changed Nicola Girt. Beadle's topiramate. I am also having her maintain her hydrocortisone cream, Ventolin HFA, naproxen, bictegravir-emtricitabine-tenofovir AF, diclofenac Sodium, cetirizine, cyclobenzaprine, omeprazole, valACYclovir, hydrochlorothiazide, amoxicillin-clavulanate, fluconazole, acetaminophen, and glipiZIDE.   Meds ordered this encounter  Medications   topiramate (TOPAMAX) 50 MG tablet    Sig: Take 1 tablet (50 mg total) by mouth daily.    Dispense:  30 tablet    Refill:  2    Order Specific Question:   Supervising Provider    Answer:   Thayer Headings [id number]    Total encounter time spent 60 min including direct face to face discussion, review of urgent care visit recently and diagnostic testing ordered  from other provider.    She declined labs to be drawn today and wants to do this at her return visit when she comes back for Depo in February.   Janene Madeira, MSN, NP-C [facility] [email or web address] Pager: [phone] Office: [phone] [facility] Main Line: [place] [employer] Welcome

## 2022-03-19 NOTE — Patient Instructions (Addendum)
Nice to see you today.   Please come back for a visit with [name] to get your next depo shot between February 1 - 15th.    For the Headaches - increase the Topamax to 50 mg (2 tabs from your current bottle). New prescription sent in for you at higher dose.   Stop the glipizide (diabetes medicine).   Plan to come next time so we can better evaluate your blood pressure.   For the heel pain I suspect this is plantar fasciitis. Would recommend volteren up to 4 times a day, Stretches with rolling the heel/foot with a tennis ball or frozen water bottle. Do this first thing in the morning and again at night.   For primary care services, please call for a new patient appointment:   [facility] - [address] [place].  [phone]    Keep your hands warm!    Plantar Fasciitis  Plantar fasciitis is a painful foot condition that affects the heel. It occurs when the band of tissue that connects the toes to the heel bone (plantar fascia) becomes irritated. This can happen as the result of exercising too much or doing other repetitive activities (overuse injury). Plantar fasciitis can cause mild irritation to severe pain that makes it difficult to walk or move. The pain is usually worse in the morning after sleeping, or after sitting or lying down for a period of time. Pain may also be worse after long periods of walking or standing. What are the causes? This condition may be caused by: Standing for long periods of time. Wearing shoes that do not have good arch support. Doing activities that put stress on joints (high-impact activities). This includes ballet and exercise that makes your heart beat faster (aerobic exercise), such as running. Being overweight. An abnormal way of walking (gait). Tight muscles in the back of your lower leg (calf). High arches in your feet or flat feet. Starting a new athletic activity. What are the signs or symptoms? The main symptom of this  condition is heel pain. Pain may get worse after the following: Taking the first steps after a time of rest, especially in the morning after awakening, or after you have been sitting or lying down for a while. Long periods of standing still. Pain may decrease after 30-45 minutes of activity, such as gentle walking. How is this diagnosed? This condition may be diagnosed based on your medical history, a physical exam, and your symptoms. Your health care provider will check for: A tender area on the bottom of your foot. A high arch in your foot or flat feet. Pain when you move your foot. Difficulty moving your foot. You may have imaging tests to confirm the diagnosis, such as: X-rays. Ultrasound. MRI. How is this treated? Treatment for plantar fasciitis depends on how severe your condition is. Treatment may include: Rest, ice, pressure (compression), and raising (elevating) the affected foot. This is called RICE therapy. Your health care provider may recommend RICE therapy along with over-the-counter pain medicines to manage your pain. Exercises to stretch your calves and your plantar fascia. A splint that holds your foot in a stretched, upward position while you sleep (night splint). Physical therapy to relieve symptoms and prevent problems in the future. Injections of steroid medicine (cortisone) to relieve pain and inflammation. Stimulating your plantar fascia with electrical impulses (extracorporeal shock wave therapy). This is usually the last treatment option before surgery. Surgery, if other treatments have not worked after 12 months.  Follow these instructions at home: Managing pain, stiffness, and swelling  If directed, put ice on the painful area. To do this: Put ice in a plastic bag, or use a frozen bottle of water. Place a towel between your skin and the bag or bottle. Roll the bottom of your foot over the bag or bottle. Do this for 20 minutes, 2-3 times a day. Wear athletic  shoes that have air-sole or gel-sole cushions, or try soft shoe inserts that are designed for plantar fasciitis. Elevate your foot above the level of your heart while you are sitting or lying down. Activity Avoid activities that cause pain. Ask your health care provider what activities are safe for you. Do physical therapy exercises and stretches as told by your health care provider. Try activities and forms of exercise that are easier on your joints (low impact). Examples include swimming, water aerobics, and biking. General instructions Take over-the-counter and prescription medicines only as told by your health care provider. Wear a night splint while sleeping, if told by your health care provider. Loosen the splint if your toes tingle, become numb, or turn cold and blue. Maintain a healthy weight, or work with your health care provider to lose weight as needed. Keep all follow-up visits. This is important. Contact a health care provider if you have: Symptoms that do not go away with home treatment. Pain that gets worse. Pain that affects your ability to move or do daily activities. Summary Plantar fasciitis is a painful foot condition that affects the heel. It occurs when the band of tissue that connects the toes to the heel bone (plantar fascia) becomes irritated. Heel pain is the main symptom of this condition. It may get worse after exercising too much or standing still for a long time. Treatment varies, but it usually starts with rest, ice, pressure (compression), and raising (elevating) the affected foot. This is called RICE therapy. Over-the-counter medicines can also be used to manage pain. This information is not intended to replace advice given to you by your health care provider. Make sure you discuss any questions you have with your health care provider. Document Revised: 06/15/2019 Document Reviewed: 06/15/2019 Elsevier Patient Education  [place].

## 2022-03-19 NOTE — Assessment & Plan Note (Signed)
History of gestational DM but no A1C within the last few years. Since pregnancy in 2018 she has also lost weight. Headaches, tingling could be a sign of hypoglycemia. Will have her stop glipizide so not to risk hypoglycemia until she follows up with labs at return visit in early/mid February when she is due for depo.

## 2022-04-11 ENCOUNTER — Telehealth: Payer: Self-pay

## 2022-04-11 NOTE — Telephone Encounter (Signed)
Patient called complaining of slight chest pain and a chipped tooth. Patient stated she does not have a dentist and is not established with our dental clinic. I have advised the patient if having chest pain she will need to go to the Ed. In regards to her tooth I have provided her with dental offices that accept medicaid.  Kristin Hart T Brooks Sailors

## 2022-04-16 ENCOUNTER — Other Ambulatory Visit: Payer: Medicaid Other

## 2022-04-16 ENCOUNTER — Ambulatory Visit: Payer: Medicaid Other

## 2022-04-16 ENCOUNTER — Other Ambulatory Visit: Payer: Self-pay

## 2022-04-16 DIAGNOSIS — B20 Human immunodeficiency virus [HIV] disease: Secondary | ICD-10-CM

## 2022-04-16 DIAGNOSIS — Z113 Encounter for screening for infections with a predominantly sexual mode of transmission: Secondary | ICD-10-CM

## 2022-04-16 DIAGNOSIS — Z79899 Other long term (current) drug therapy: Secondary | ICD-10-CM

## 2022-05-01 ENCOUNTER — Encounter: Payer: Self-pay | Admitting: Family

## 2022-05-01 ENCOUNTER — Other Ambulatory Visit: Payer: Self-pay

## 2022-05-01 ENCOUNTER — Ambulatory Visit (INDEPENDENT_AMBULATORY_CARE_PROVIDER_SITE_OTHER): Payer: Medicaid Other | Admitting: Family

## 2022-05-01 VITALS — BP 115/84 | HR 105 | Ht 67.5 in | Wt 165.2 lb

## 2022-05-01 DIAGNOSIS — I158 Other secondary hypertension: Secondary | ICD-10-CM | POA: Diagnosis not present

## 2022-05-01 DIAGNOSIS — R202 Paresthesia of skin: Secondary | ICD-10-CM | POA: Diagnosis not present

## 2022-05-01 DIAGNOSIS — Z21 Asymptomatic human immunodeficiency virus [HIV] infection status: Secondary | ICD-10-CM

## 2022-05-01 DIAGNOSIS — R200 Anesthesia of skin: Secondary | ICD-10-CM

## 2022-05-01 DIAGNOSIS — F1520 Other stimulant dependence, uncomplicated: Secondary | ICD-10-CM

## 2022-05-01 DIAGNOSIS — Z3042 Encounter for surveillance of injectable contraceptive: Secondary | ICD-10-CM | POA: Diagnosis not present

## 2022-05-01 DIAGNOSIS — Z113 Encounter for screening for infections with a predominantly sexual mode of transmission: Secondary | ICD-10-CM

## 2022-05-01 DIAGNOSIS — Z3009 Encounter for other general counseling and advice on contraception: Secondary | ICD-10-CM

## 2022-05-01 MED ORDER — MEDROXYPROGESTERONE ACETATE 150 MG/ML IM SUSP
150.0000 mg | Freq: Once | INTRAMUSCULAR | Status: AC
  Administered 2022-05-01: 150 mg via INTRAMUSCULAR

## 2022-05-01 NOTE — Patient Instructions (Addendum)
Nice to see you.  We will check your lab work today.  Continue to take your medication daily as prescribed.  Please call [facility]) to schedule/follow up on your dental care at [phone]  Refills have been sent to the pharmacy.  Plan for follow up in 1 months or sooner if needed with lab work on the same day.  Have a great day and stay safe!

## 2022-05-01 NOTE — Progress Notes (Unsigned)
Brief Narrative   Patient ID: Kristin Hart, female    DOB: 05-May-1980, 42 y.o.   MRN: [id number]  Ms. Pilkenton is a 42 y/o caucasian female diagnosed with HIV disease on 01/02/19 with risk factor of heterosexual contact and IV drug use. Initial viral load on 06/23/19 23,300 and CD4 count 577. Genotype with no significant drug resistance. Entered care at [facility] Stage 1. [id number] negative. No history of opportunistic infection. Sole medication regimen of Biktarvy   Subjective:    Chief Complaint  Patient presents with   Follow-up    B20    HPI:  Kristin Hart is a 42 y.o. female with HIV disease last seen on 03/19/22 by Janene Madeira, NP with well controlled virus and good adherence and tolerance to [place]. Here today for follow up.  Kymia has not been doing too well since her last office visit and has been having increased numbness and tingling along the ulnar nerve distribution of the right hand that has not improved with the addition of keeping her hands warm. She has decreased grip strength and notes occasional muscle twitching/spasms on the same side. No trauma or injury that she can recall. Having increase dental problems and recently had a tooth break.  Housing situation remains the same as previous. Contiues to use methamphetamine although has not used in the past 48 hours. Continues to take the [facility] as prescribed with no adverse side effects or problems obtaining from the pharmacy. Condoms and STD testing offered.   Denies fevers, chills, night sweats, headaches, changes in vision, neck pain/stiffness, nausea, diarrhea, vomiting, lesions or rashes.   No Known Allergies    Outpatient Medications Prior to Visit  Medication Sig Dispense Refill   acetaminophen (TYLENOL) 500 MG tablet Take 1 tablet (500 mg total) by mouth every 6 (six) hours as needed. 30 tablet 0   bictegravir-emtricitabine-tenofovir AF (BIKTARVY) 50-200-25 MG TABS tablet Take 1 tablet by mouth daily. 30  tablet 5   cetirizine (ZYRTEC) 10 MG tablet Take 1 tablet (10 mg total) by mouth daily. 30 tablet 10   cyclobenzaprine (FLEXERIL) 5 MG tablet Take 1 tablet (5 mg total) by mouth at bedtime. 30 tablet 4   diclofenac Sodium (VOLTAREN) 1 % GEL Apply 2 g topically 4 (four) times daily as needed (for elbow pain). As needed for hip pain. 2 g 3   fluconazole (DIFLUCAN) 150 MG tablet Take 1 tablet today and 1 tablet 3 days from initiation. 2 tablet 0   glipiZIDE (GLUCOTROL) 5 MG tablet Take 2 tablets (10 mg total) by mouth daily before breakfast. 30 tablet 0   hydrochlorothiazide (HYDRODIURIL) 12.5 MG tablet TAKE 1 TABLET(12.5 MG) BY MOUTH DAILY 90 tablet 1   hydrocortisone cream 1 % Apply to affected area 2 times daily 15 g 0   naproxen (NAPROSYN) 500 MG tablet Take 1 tablet (500 mg total) by mouth daily as needed for moderate pain. 30 tablet 0   omeprazole (PRILOSEC) 40 MG capsule Take 1 capsule (40 mg total) by mouth daily. 30 capsule 5   topiramate (TOPAMAX) 50 MG tablet Take 1 tablet (50 mg total) by mouth daily. 30 tablet 2   valACYclovir (VALTREX) 500 MG tablet Take 1 tablet (500 mg total) by mouth 2 (two) times daily. 60 tablet 2   VENTOLIN HFA 108 (90 Base) MCG/ACT inhaler INHALE 1 PUFF INTO THE LUNGS EVERY 4 HOURS AS NEEDED. 18 each 2   amoxicillin-clavulanate (AUGMENTIN) 875-125 MG tablet Take 1 tablet by  mouth every 12 (twelve) hours. (Patient not taking: Reported on 05/01/2022) 14 tablet 0   No facility-administered medications prior to visit.     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, per pt   Diabetes mellitus without complication ([place])    type 2   Foreign body in small intestine    HIV (human immunodeficiency virus infection) ([place])    Hypothyroidism    Stomach ulcer      Past Surgical History:  Procedure Laterality Date   CESAREAN SECTION N/A 07/04/2016   Procedure: CESAREAN SECTION;  Surgeon:  Truett Mainland, DO;  Location: [place];  Service: Obstetrics;  Laterality: N/A;   ENTEROSCOPY N/A 11/08/2017   Procedure: ENTEROSCOPY;  Surgeon: Irene Shipper, MD;  Location: [facility] ENDOSCOPY;  Service: Endoscopy;  Laterality: N/A;   NO PAST SURGERIES        Review of Systems    Objective:    BP 115/84   Pulse (!) 105   Ht 5' 7.5" (1.715 m)   Wt 165 lb 3.2 oz (74.9 kg)   BMI 25.49 kg/m  Nursing note and vital signs reviewed.  Physical Exam      05/01/2022    3:46 PM 03/19/2022    3:19 PM 01/25/2022    3:18 PM 12/21/2021    2:15 PM 11/29/2021    1:33 PM  Depression screen PHQ 2/9  Decreased Interest 0 0 0 1 1  Down, Depressed, Hopeless 1 1 1 1 1  $ PHQ - 2 Score 1 1 1 2 2  $ Altered sleeping    1 1  Tired, decreased energy    1 1  Change in appetite    0 0  Feeling bad or failure about yourself      1  Trouble concentrating    1 0  Moving slowly or fidgety/restless    0 0  Suicidal thoughts    0 0  PHQ-9 Score    5 5       Assessment & Plan:    Patient Active Problem List   Diagnosis Date Noted   Plantar fasciitis, right 03/19/2022   Numbness and tingling in both hands 03/19/2022   Poor social situation 10/25/2021   Axillary abscess 09/20/2021   Therapeutic drug monitoring 08/24/2021   Birth control counseling 12/20/2020   Hypertension 09/21/2020   Grief 08/29/2020   Encounter for initial prescription of injectable contraceptive 07/29/2020   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   Screening for cervical cancer 04/21/2020   Screening for STDs (sexually transmitted diseases) 04/21/2020   Costochondritis 02/19/2020   Bilateral hip pain 02/19/2020   Elbow pain, right 12/04/2019   Housing problems 11/23/2019   Mental health disorder 10/12/2019   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   Paresthesias 10/12/2019   Healthcare maintenance 08/05/2019   HIV (human immunodeficiency virus infection) ([place]) 12/31/2018   Anxiety 05/29/2017    Self-mutilation 05/29/2017   Depression 06/07/2016   Marijuana use 06/07/2016   Low vitamin D level 03/15/2016   Prediabetes 02/22/2016   Asthma 02/22/2016   ADD (attention deficit disorder) 02/22/2016   Methamphetamine use disorder, severe, dependence ([place]) 02/22/2016   Hyperlipidemia with target LDL less than 70 01/31/2012   Migraine 01/31/2012   Tremor, 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     Problem List Items Addressed This Visit   None    I have discontinued Caryl Pina R. Carp's amoxicillin-clavulanate.  I am also having her maintain her hydrocortisone cream, Ventolin HFA, naproxen, bictegravir-emtricitabine-tenofovir AF, diclofenac Sodium, cetirizine, cyclobenzaprine, omeprazole, valACYclovir, hydrochlorothiazide, fluconazole, acetaminophen, glipiZIDE, and topiramate.   No orders of the defined types were placed in this encounter.    Follow-up: No follow-ups on file.   Terri Piedra, MSN, FNP-C Nurse Practitioner [facility] for Infectious Disease [place] number: [phone]

## 2022-05-02 ENCOUNTER — Encounter: Payer: Self-pay | Admitting: Family

## 2022-05-02 LAB — T-HELPER CELL (CD4) - (RCID CLINIC ONLY)
CD4 % Helper T Cell: 53 % (ref 33–65)
CD4 T Cell Abs: 672 /uL (ref 400–1790)

## 2022-05-02 NOTE — Assessment & Plan Note (Signed)
Kristin Hart's blood pressure is improved when staying of methamphetamine for the last 48 hours and would suspect the cause of her hypertension is continue methamphetamine use. Will continue low dose hydrochlorothiazide for now. No neurological/ophthalmological signs/symptoms.

## 2022-05-02 NOTE — Assessment & Plan Note (Signed)
Vihana has had numbness and tingling of both hands which is now more pronounced on the right side along the ulnar nerve distribution. Will refer to Neurology for possible nerve conduction study in the absence of any trauma or epicondylitis. Unlikely carpal tunnel syndrome based on current symptoms.

## 2022-05-02 NOTE — Assessment & Plan Note (Signed)
Continues to use methamphetamine and has been sober for 48 hours now. This remains as barrier for her health progression and has continued dental problems. Blood pressure appears improved without use of methamphetamine. Working with [employer].

## 2022-05-02 NOTE — Assessment & Plan Note (Signed)
Kristin Hart wishes to continue with Depo-provera injections for birth control. Urine pregnancy test is negative. Depo-provera injection provided without complication. Due for next injection in 3 months.

## 2022-05-02 NOTE — Assessment & Plan Note (Signed)
Price continues to have well controlled virus with good adherence and tolerance to [employer]. Reviewed previous lab work and discussed plan of care. Check lab work today. Continue current dose of Biktarvy.

## 2022-05-03 LAB — RPR: RPR Ser Ql: NONREACTIVE

## 2022-05-03 LAB — BASIC METABOLIC PANEL WITH GFR
BUN: 11 mg/dL (ref 7–25)
CO2: 24 mmol/L (ref 20–32)
Calcium: 10.2 mg/dL (ref 8.6–10.2)
Chloride: 95 mmol/L — ABNORMAL LOW (ref 98–110)
Creat: 0.93 mg/dL (ref 0.50–0.99)
Glucose, Bld: 440 mg/dL — ABNORMAL HIGH (ref 65–99)
Potassium: 4.1 mmol/L (ref 3.5–5.3)
Sodium: 131 mmol/L — ABNORMAL LOW (ref 135–146)
eGFR: 80 mL/min/{1.73_m2} (ref >=60)

## 2022-05-03 LAB — HIV-1 RNA QUANT-NO REFLEX-BLD
HIV 1 RNA Quant: <20 Copies/mL — ABNORMAL HIGH
HIV-1 RNA Quant, Log: <1.3 Log cps/mL — ABNORMAL HIGH

## 2022-05-04 NOTE — Progress Notes (Unsigned)
Initial neurology clinic note  SERVICE DATE: 05/10/22  Reason for Evaluation: Consultation requested by Golden Circle, FNP for an opinion regarding numbness and tingling in right hand. My final recommendations will be communicated back to the requesting physician by way of shared medical record or letter to requesting physician via [place] mail.  HPI: This is Ms. Kristin Hart, a 42 y.o. right-handed female with a medical history of HIV, drug abuse, DM, HLD, hypothyroidism, asthma, migraine, borderline personality disorder, depression, bursitis of left hip who presents to neurology clinic with the chief complaint of numbness and tingling in hands. The patient is alone.  Patient has been having symptoms a little more than a month. She mentions that she has pins and needles in her right hand, more in her 4th and 5th digits. She also has spasms in her right shoulder and right elbow. She has noticed weakness of her hands and has difficulty opening a jar or holding a pen. She has not notice any pattern to when it happens. She states this bracelet she wears on her right wrist helps. While she has used IV drugs in the past, she denies current use and denies symptoms are related to IV puncture. She is currently doing meth.  She occasionally has milder symptoms in her left arm. She denies significant symptoms in her legs.  Of note, she takes Topamax for migraines.  She does not report any constitutional symptoms like fever, night sweats, anorexia or unintentional weight loss.  EtOH use: None  Restrictive diet? No Family history of neuropathy/myopathy/neurologic disease? None known   MEDICATIONS:  Outpatient Encounter Medications as of 05/10/2022  Medication Sig   acetaminophen (TYLENOL) 500 MG tablet Take 1 tablet (500 mg total) by mouth every 6 (six) hours as needed.   bictegravir-emtricitabine-tenofovir AF (BIKTARVY) 50-200-25 MG TABS tablet Take 1 tablet by mouth daily.   cetirizine (ZYRTEC)  10 MG tablet Take 1 tablet (10 mg total) by mouth daily.   cyclobenzaprine (FLEXERIL) 5 MG tablet Take 1 tablet (5 mg total) by mouth at bedtime.   diclofenac Sodium (VOLTAREN) 1 % GEL Apply 2 g topically 4 (four) times daily as needed (for elbow pain). As needed for hip pain.   fluconazole (DIFLUCAN) 150 MG tablet Take 1 tablet today and 1 tablet 3 days from initiation.   glipiZIDE (GLUCOTROL) 5 MG tablet Take 2 tablets (10 mg total) by mouth daily before breakfast.   hydrochlorothiazide (HYDRODIURIL) 12.5 MG tablet TAKE 1 TABLET(12.5 MG) BY MOUTH DAILY   hydrocortisone cream 1 % Apply to affected area 2 times daily   naproxen (NAPROSYN) 500 MG tablet Take 1 tablet (500 mg total) by mouth daily as needed for moderate pain.   omeprazole (PRILOSEC) 40 MG capsule Take 1 capsule (40 mg total) by mouth daily.   topiramate (TOPAMAX) 50 MG tablet Take 1 tablet (50 mg total) by mouth daily.   valACYclovir (VALTREX) 500 MG tablet TAKE 1 TABLET(500 MG) BY MOUTH TWICE DAILY   VENTOLIN HFA 108 (90 Base) MCG/ACT inhaler INHALE 1 PUFF INTO THE LUNGS EVERY 4 HOURS AS NEEDED.   [DISCONTINUED] valACYclovir (VALTREX) 500 MG tablet Take 1 tablet (500 mg total) by mouth 2 (two) times daily.   No facility-administered encounter medications on file as of 05/10/2022.    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, per pt   Diabetes mellitus without complication ([place])  type 2   Foreign body in small intestine    HIV (human immunodeficiency virus infection) ([place])    Hypothyroidism    Stomach ulcer     PAST SURGICAL HISTORY: Past Surgical History:  Procedure Laterality Date   CESAREAN SECTION N/A 07/04/2016   Procedure: CESAREAN SECTION;  Surgeon: Truett Mainland, DO;  Location: [place];  Service: Obstetrics;  Laterality: N/A;   ENTEROSCOPY N/A 11/08/2017   Procedure: ENTEROSCOPY;  Surgeon: Irene Shipper, MD;  Location[facility] ENDOSCOPY;  Service: Endoscopy;  Laterality: N/A;   NO PAST SURGERIES      ALLERGIES: No Known Allergies  FAMILY HISTORY: No family history on file.  SOCIAL HISTORY: Social History   Tobacco Use   Smoking status: Never   Smokeless tobacco: Never  Vaping Use   Vaping Use: Never used  Substance Use Topics   Alcohol use: No   Drug use: Yes    Types: Methamphetamines    Comment: states she does [place] almost daily   Social History   Social History Narrative   Are you right handed or left handed? Right   Are you currently employed ? no   What is your current occupation?   Do you live at home alone? no   Who lives with you? Son and mother   What type of home do you live in: 1 story or 2 story? one    Caffeine 1-2 soda     OBJECTIVE: PHYSICAL EXAM: BP 133/79   Pulse 98   Ht 5' 7.5" (1.715 m)   Wt 163 lb 8 oz (74.2 kg)   SpO2 100%   BMI 25.23 kg/m   General: General appearance: Awake and alert. No distress. Cooperative with exam.  Skin: No obvious rash or jaundice. HEENT: Atraumatic. Anicteric. Lungs: Non-labored breathing on room air  Heart: Regular. No carotid bruits Extremities: No obvious deformity. Bilateral hands appear swollen and red Musculoskeletal: No obvious joint swelling. Psych: Affect appropriate.  Neurological: Mental Status: Alert. Speech fluent. No pseudobulbar affect Cranial Nerves: CNII: No RAPD. Visual fields grossly intact. CNIII, IV, VI: PERRL. No nystagmus. EOMI. CN V: Facial sensation intact bilaterally to fine touch. CN VII: Facial muscles symmetric and strong. No ptosis at rest. CN VIII: Hearing grossly intact bilaterally. CN IX: No hypophonia. CN X: Palate elevates symmetrically. CN XI: Full strength shoulder shrug bilaterally. CN XII: Tongue protrusion full and midline. No atrophy or fasciculations. No significant dysarthria Motor: Kristin is normal. No atrophy.  Individual muscle group testing (MRC  grade out of 5):  Movement     Neck flexion 5    Neck extension 5     Right Left   Shoulder abduction 5 5   Elbow flexion 5 5   Elbow extension 5 5   Wrist extension 5 5   Wrist flexion 5 5   Finger abduction - FDI 5 5   Finger abduction - ADM 5 5   Finger extension 5 5   Finger distal flexion - 2/'3 5 5   '$ Finger distal flexion - 4/'5 5 5   '$ Thumb flexion - FPL 5 5   Thumb abduction - APB 5 5    Hip flexion 5 5   Knee extension 5 5   Knee flexion 5 5   Dorsiflexion 5 5   Plantarflexion 5 5     Reflexes:  Right Left   Bicep 2+ 2+   Tricep 2+ 2+   BrRad 2+ 2+   Knee  2+ 2+   Ankle 2+ 2+    Pathological Reflexes: Babinski: flexor response bilaterally Hoffman: absent bilaterally Troemner: absent bilaterally Sensation: Pinprick: Intact in all extremities Coordination: Intact finger-to- nose-finger bilaterally. Romberg negative. Gait: Able to rise from chair with arms crossed unassisted. Normal, narrow-based gait. Able to walk on toes and heels.  Lab and Test Review: Internal labs: BMP (05/01/22): significant for glucose of 440, sodium 131 RPR (05/01/22): non-reactive  Imaging: MRI lumbar spine w/wo contrast (09/05/20): FINDINGS: Segmentation:  Normal   Alignment:  Slight retrolisthesis L2-3 and L3-4 and L4-5   Vertebrae: Negative for fracture or mass. Discogenic edema on the right at L5-S1 most likely due to disc degeneration with associated disc space narrowing and sclerotic bone marrow changes. Disc space infection on the right not considered likely.   Conus medullaris and cauda equina: Conus extends to the L1 level. Conus and cauda equina appear normal.   Paraspinal and other soft tissues: Negative for paraspinous mass, adenopathy, or fluid collection.   Disc levels:   L1-2: Negative   L2-3: Mild retrolisthesis. Mild disc space narrowing and disc bulging without significant stenosis   L3-4: Mild retrolisthesis. Mild disc and mild facet  degeneration. Negative for stenosis   L4-5: Extruded disc fragment on the left foramen with compression left L4 nerve root. Bilateral facet hypertrophy. Broad-based central disc protrusion. Moderate central canal stenosis. Severe subarticular stenosis on the left with left L5 nerve root impingement, and moderate to severe subarticular stenosis on the right   L5-S1: Asymmetric disc space narrowing on the right. This is associated small amount of fluid in the disc space on the right. Left-sided disc space not significantly narrowed. There is central and right-sided disc protrusion with associated spurring. There is severe right foraminal encroachment with right L5 nerve root impingement. There is also right S1 nerve root impingement.   IMPRESSION: Extruded disc fragment on the left at L4-5 with left L4 nerve root impingement. Broad-based disc protrusion with moderate spinal stenosis. Subarticular stenosis bilaterally, left greater than right   Asymmetric disc space narrowing at L5-S1 with discogenic sclerosis and edema in the right most likely degenerative in nature. Infection not considered likely. There is central and right-sided disc protrusion and spurring with severe right foraminal encroachment right L5 nerve root impingement. There is also right S1 nerve root impingement.  ASSESSMENT: Kristin Hart is a 42 y.o. female who presents for evaluation of right upper extremity numbness and tingling. She has a relevant medical history of HIV, drug abuse, DM, HLD, hypothyroidism, asthma, migraine, borderline personality disorder, depression, bursitis of left hip. Her neurological examination is essentially normal. The etiology of patient's symptoms is currently unclear. A right ulnar mononeuropathy fits best with her symptoms, but carpal tunnel or cervical radiculopathy is also possible. Given her red and swollen hands, a rheumatologic etiology is also possible. I will start with EMG to  further evaluate.  PLAN: -EMG: RUE (ulnar vs median vs cervical radiculopathy)  -Return to clinic to be determined  The impression above as well as the plan as outlined below were extensively discussed with the patient who voiced understanding. All questions were answered to their satisfaction.  When available, results of the above investigations and possible further recommendations will be communicated to the patient via telephone/MyChart. Patient to call office if not contacted after expected testing turnaround time.   Total time spent reviewing records, interview, history/exam, documentation, and coordination of care on day of encounter:  40 min   Thank you for  allowing me to participate in patient's care.  If I can answer any additional questions, I would be pleased to do so.  Kai Levins, MD   CC: Rise Patience, DO [place] [id number]  CC: Referring provider: Golden Circle, [address] [place] [place],  [place] [address]

## 2022-05-09 ENCOUNTER — Other Ambulatory Visit: Payer: Self-pay | Admitting: Family

## 2022-05-10 ENCOUNTER — Ambulatory Visit (INDEPENDENT_AMBULATORY_CARE_PROVIDER_SITE_OTHER): Payer: Medicaid Other | Admitting: Neurology

## 2022-05-10 ENCOUNTER — Encounter: Payer: Self-pay | Admitting: Neurology

## 2022-05-10 VITALS — BP 133/79 | HR 98 | Ht 67.5 in | Wt 163.5 lb

## 2022-05-10 DIAGNOSIS — R200 Anesthesia of skin: Secondary | ICD-10-CM

## 2022-05-10 DIAGNOSIS — F1990 Other psychoactive substance use, unspecified, uncomplicated: Secondary | ICD-10-CM | POA: Diagnosis not present

## 2022-05-10 DIAGNOSIS — R209 Unspecified disturbances of skin sensation: Secondary | ICD-10-CM | POA: Diagnosis not present

## 2022-05-10 DIAGNOSIS — R202 Paresthesia of skin: Secondary | ICD-10-CM

## 2022-05-10 DIAGNOSIS — R29898 Other symptoms and signs involving the musculoskeletal system: Secondary | ICD-10-CM

## 2022-05-10 NOTE — Patient Instructions (Signed)
I will investigate your symptoms further with a muscle and nerve test called EMG. It could be that you have a compressed nerve causing your symptoms.  We will determine next steps after your EMG.  The physicians and staff at [facility] are committed to providing excellent care. You may receive a survey requesting feedback about your experience at our office. We strive to receive "very good" responses to the survey questions. If you feel that your experience would prevent you from giving the office a "very good " response, please contact our office to try to remedy the situation. We may be reached at [phone]. Thank you for taking the time out of your busy day to complete the survey.  Kai Levins, MD [facility]  ELECTROMYOGRAM AND NERVE CONDUCTION STUDIES (EMG/NCS) INSTRUCTIONS  How to Prepare The neurologist conducting the EMG will need to know if you have certain medical conditions. Tell the neurologist and other EMG lab personnel if you: Have a pacemaker or any other electrical medical device Take blood-thinning medications Have hemophilia, a blood-clotting disorder that causes prolonged bleeding Bathing Take a shower or bath shortly before your exam in order to remove oils from your skin. Don't apply lotions or creams before the exam.  What to Expect You'll likely be asked to change into a hospital gown for the procedure and lie down on an examination table. The following explanations can help you understand what will happen during the exam.  Electrodes. The neurologist or a technician places surface electrodes at various locations on your skin depending on where you're experiencing symptoms. Or the neurologist may insert needle electrodes at different sites depending on your symptoms.  Sensations. The electrodes will at times transmit a tiny electrical current that you may feel as a twinge or spasm. The needle electrode may cause discomfort or pain that usually ends shortly  after the needle is removed. If you are concerned about discomfort or pain, you may want to talk to the neurologist about taking a short break during the exam.  Instructions. During the needle EMG, the neurologist will assess whether there is any spontaneous electrical activity when the muscle is at rest - activity that isn't present in healthy muscle tissue - and the degree of activity when you slightly contract the muscle.  He or she will give you instructions on resting and contracting a muscle at appropriate times. Depending on what muscles and nerves the neurologist is examining, he or she may ask you to change positions during the exam.  After your EMG You may experience some temporary, minor bruising where the needle electrode was inserted into your muscle. This bruising should fade within several days. If it persists, contact your primary care doctor.

## 2022-05-14 ENCOUNTER — Encounter: Payer: Self-pay | Admitting: Neurology

## 2022-05-14 ENCOUNTER — Encounter: Payer: Medicaid Other | Admitting: Neurology

## 2022-05-21 ENCOUNTER — Encounter: Payer: Self-pay | Admitting: Infectious Diseases

## 2022-05-21 ENCOUNTER — Telehealth: Payer: Self-pay

## 2022-05-21 MED ORDER — FLUCONAZOLE 150 MG PO TABS: 150.0000 mg | ORAL_TABLET | ORAL | 0 refills | Status: DC

## 2022-05-21 NOTE — Telephone Encounter (Signed)
Patient called to report burning and itching associated with yeast infection. She requested we send in [place] as Kristin Hart has in past. Janene Madeira sent in 150 mg Diflucan ([facility] [place])to be taken 1 every 3 days with instructions to be seen if no better. Advised of instructions and that if symptoms have not resolved in a week to please come in so we can do further testing.

## 2022-05-24 ENCOUNTER — Telehealth: Payer: Self-pay

## 2022-05-24 NOTE — Telephone Encounter (Signed)
Received call from patient, she reports she has not been feeling well for the last two weeks. Reports dry, non-productive cough, sore throat, and hoarseness.   Says she has been around some people who were sick, but they had different symptoms. Denies feeling like she's had fever or chills. Denies chest pain except when coughing.   She takes Zyrtec daily. Spoke with Janene Madeira, NP who recommend she try a different allergy medication and possibly Flonase nasal spray. Relayed these recommendations to [facility] and moved her appointment up to next Monday. Asked her to please call if symptoms worsen. She is agreeable to this plan.   Beryle Flock, RN

## 2022-05-26 ENCOUNTER — Other Ambulatory Visit: Payer: Self-pay | Admitting: Family

## 2022-05-26 DIAGNOSIS — M25551 Pain in right hip: Secondary | ICD-10-CM

## 2022-05-28 ENCOUNTER — Encounter: Payer: Self-pay | Admitting: Family

## 2022-05-28 ENCOUNTER — Ambulatory Visit (INDEPENDENT_AMBULATORY_CARE_PROVIDER_SITE_OTHER): Payer: Medicaid Other | Admitting: Family

## 2022-05-28 ENCOUNTER — Other Ambulatory Visit: Payer: Self-pay

## 2022-05-28 VITALS — BP 137/90 | HR 107 | Temp 97.9°F

## 2022-05-28 DIAGNOSIS — Z21 Asymptomatic human immunodeficiency virus [HIV] infection status: Secondary | ICD-10-CM

## 2022-05-28 DIAGNOSIS — R0982 Postnasal drip: Secondary | ICD-10-CM | POA: Insufficient documentation

## 2022-05-28 DIAGNOSIS — Z0000 Encounter for general adult medical examination without abnormal findings: Secondary | ICD-10-CM

## 2022-05-28 DIAGNOSIS — R1030 Lower abdominal pain, unspecified: Secondary | ICD-10-CM | POA: Diagnosis not present

## 2022-05-28 DIAGNOSIS — J4521 Mild intermittent asthma with (acute) exacerbation: Secondary | ICD-10-CM

## 2022-05-28 DIAGNOSIS — F1520 Other stimulant dependence, uncomplicated: Secondary | ICD-10-CM

## 2022-05-28 MED ORDER — ALBUTEROL SULFATE HFA 108 (90 BASE) MCG/ACT IN AERS: INHALATION_SPRAY | RESPIRATORY_TRACT | 2 refills | Status: DC

## 2022-05-28 MED ORDER — AMOXICILLIN-POT CLAVULANATE 875-125 MG PO TABS: 1.0000 | ORAL_TABLET | Freq: Two times a day (BID) | ORAL | 0 refills | Status: DC

## 2022-05-28 MED ORDER — BICTEGRAVIR-EMTRICITAB-TENOFOV 50-200-25 MG PO TABS: 1.0000 | ORAL_TABLET | Freq: Every day | ORAL | 5 refills | Status: DC

## 2022-05-28 MED ORDER — LEVOCETIRIZINE DIHYDROCHLORIDE 5 MG PO TABS: 5.0000 mg | ORAL_TABLET | Freq: Every evening | ORAL | 5 refills | Status: DC

## 2022-05-28 NOTE — Assessment & Plan Note (Signed)
Kristin Hart continues to have well-controlled virus with good adherence and tolerance to [employer].  Reviewed lab work and discussed plan of care.  Continue current dose of Biktarvy.  Plan for follow-up in 1 month or sooner if needed.

## 2022-05-28 NOTE — Telephone Encounter (Signed)
Please advise on refill request

## 2022-05-28 NOTE — Assessment & Plan Note (Signed)
Kristin Hart continues to use methamphetamine with last use being earlier today. Counseled about continued drug use the potential for complications and adverse effects.

## 2022-05-28 NOTE — Assessment & Plan Note (Signed)
Discussed importance of safe sexual practice and condom use. Condoms and STD testing offered.

## 2022-05-28 NOTE — Progress Notes (Signed)
Brief Narrative   Patient ID: Kristin Hart, female    DOB: 1981-01-31, 42 y.o.   MRN: [id number]  Kristin Hart is a 42 y/o caucasian female diagnosed with HIV disease on 01/02/19 with risk factor of heterosexual contact and IV drug use. Initial viral load on 06/23/19 23,300 and CD4 count 577. Genotype with no significant drug resistance. Entered care at [facility] Stage 1. [id number] negative. No history of opportunistic infection. Sole medication regimen of Biktarvy   Subjective:    Chief Complaint  Patient presents with   Acute Visit    Congestion, cough, shortness of breath   HIV Positive/AIDS    HPI:  Kristin Hart is a 42 y.o. female with HIV disease and methamphetamine use last seen on 05/01/22 with well controlled virus and good adherence and tolerance to [employer]. Viral load was undetectable and CD4 count 672. RPR non-reactive for syphilis. Seen by Neurology with recommendation for nerve conduction study. Here today for follow up.  Kristin Hart has not been doing very well since her last office visit and has had productive cough, sore throat, and shortness of breath going on for about 2 weeks with course of symptoms staying about the same since initial onset. Covid testing was negative. Also has concern about generalized feelings of tingling at times and ongoing chronic lower abdominal pain. Feels like she has to go to the bathroom at times. Unsure if she has lactose intolerance or gluten intolerance which may be contributing. No additional work up has been done. Pain is described as cramping at times and is sometimes improved with bowel movement.  Condoms and STD testing offered.   Denies fevers, chills, night sweats, headaches, changes in vision, neck pain/stiffness, nausea, diarrhea, vomiting, lesions or rashes.  No Known Allergies    Outpatient Medications Prior to Visit  Medication Sig Dispense Refill   acetaminophen (TYLENOL) 500 MG tablet Take 1 tablet (500 mg total) by mouth every  6 (six) hours as needed. 30 tablet 0   cyclobenzaprine (FLEXERIL) 5 MG tablet Take 1 tablet (5 mg total) by mouth at bedtime. 30 tablet 4   diclofenac Sodium (VOLTAREN) 1 % GEL APPLY 2 GRAMS TOPICALLY TO THE AFFECTED AREA FOUR TIMES DAILY AS NEEDED FOR ELBOW OR PAIN OR HIP PAIN 200 g 0   fluconazole (DIFLUCAN) 150 MG tablet Take 1 tablet today and 1 tablet 3 days from initiation. 2 tablet 0   fluconazole (DIFLUCAN) 150 MG tablet Take 1 tablet (150 mg total) by mouth every 3 (three) days. Repeat dose in 3-5 days if symptoms still present. 2 tablet 0   hydrochlorothiazide (HYDRODIURIL) 12.5 MG tablet TAKE 1 TABLET(12.5 MG) BY MOUTH DAILY 90 tablet 1   hydrocortisone cream 1 % Apply to affected area 2 times daily 15 g 0   naproxen (NAPROSYN) 500 MG tablet Take 1 tablet (500 mg total) by mouth daily as needed for moderate pain. 30 tablet 0   omeprazole (PRILOSEC) 40 MG capsule Take 1 capsule (40 mg total) by mouth daily. 30 capsule 5   topiramate (TOPAMAX) 50 MG tablet Take 1 tablet (50 mg total) by mouth daily. 30 tablet 2   valACYclovir (VALTREX) 500 MG tablet TAKE 1 TABLET(500 MG) BY MOUTH TWICE DAILY 60 tablet 2   bictegravir-emtricitabine-tenofovir AF (BIKTARVY) 50-200-25 MG TABS tablet Take 1 tablet by mouth daily. 30 tablet 5   cetirizine (ZYRTEC) 10 MG tablet Take 1 tablet (10 mg total) by mouth daily. 30 tablet 10   glipiZIDE (GLUCOTROL)  5 MG tablet Take 2 tablets (10 mg total) by mouth daily before breakfast. 30 tablet 0   VENTOLIN HFA 108 (90 Base) MCG/ACT inhaler INHALE 1 PUFF INTO THE LUNGS EVERY 4 HOURS AS NEEDED. 18 each 2   No facility-administered medications prior to visit.     Past Medical History:  Diagnosis Date   Asthma    Borderline personality disorder ([place])    with schizophrenic tendancies, per pt   Diabetes mellitus without complication ([place])    type 2   Foreign body in small intestine    HIV (human immunodeficiency virus infection) ([place])    Hypothyroidism     Stomach ulcer      Past Surgical History:  Procedure Laterality Date   CESAREAN SECTION N/A 07/04/2016   Procedure: CESAREAN SECTION;  Surgeon: Truett Mainland, DO;  Location: [place];  Service: Obstetrics;  Laterality: N/A;   ENTEROSCOPY N/A 11/08/2017   Procedure: ENTEROSCOPY;  Surgeon: Irene Shipper, MD;  Location: [facility] ENDOSCOPY;  Service: Endoscopy;  Laterality: N/A;   NO PAST SURGERIES        Review of Systems  Constitutional:  Positive for fatigue. Negative for appetite change, chills, diaphoresis, fever and unexpected weight change.  HENT:  Positive for congestion, postnasal drip and sore throat. Negative for sinus pressure and trouble swallowing.   Eyes:        Negative for acute change in vision  Respiratory:  Positive for cough and shortness of breath. Negative for chest tightness and wheezing.   Cardiovascular:  Negative for chest pain.  Gastrointestinal:  Negative for diarrhea, nausea and vomiting.  Genitourinary:  Negative for dysuria, pelvic pain and vaginal discharge.  Musculoskeletal:  Negative for neck pain and neck stiffness.  Skin:  Negative for rash.  Neurological:  Positive for headaches. Negative for seizures, syncope and weakness.  Hematological:  Negative for adenopathy. Does not bruise/bleed easily.  Psychiatric/Behavioral:  Negative for hallucinations.       Objective:    BP (!) 137/90   Pulse (!) 107   Temp 97.9 F (36.6 C) (Oral)   SpO2 100%  Nursing note and vital signs reviewed.  Physical Exam Constitutional:      General: She is not in acute distress.    Appearance: She is well-developed.  HENT:     Mouth/Throat:     Comments: Post-nasal drip in posterior throat Eyes:     Conjunctiva/sclera: Conjunctivae normal.  Cardiovascular:     Rate and Rhythm: Normal rate and regular rhythm.     Heart sounds: Normal heart sounds. No murmur heard.    No friction rub. No gallop.  Pulmonary:     Effort: Pulmonary effort is normal. No  respiratory distress.     Breath sounds: Normal breath sounds. No wheezing or rales.  Chest:     Chest wall: No tenderness.  Abdominal:     General: Abdomen is flat. Bowel sounds are normal. There is no distension or abdominal bruit. There are no signs of injury.     Palpations: Abdomen is soft. There is no shifting dullness or mass.     Tenderness: There is abdominal tenderness in the right lower quadrant.  Musculoskeletal:     Cervical back: Neck supple.  Lymphadenopathy:     Cervical: No cervical adenopathy.  Skin:    General: Skin is warm and dry.     Findings: No rash.  Neurological:     Mental Status: She is alert and oriented to person, place,  and time.  Psychiatric:        Behavior: Behavior normal.        Thought Content: Thought content normal.        Judgment: Judgment normal.         05/01/2022    3:46 PM 03/19/2022    3:19 PM 01/25/2022    3:18 PM 12/21/2021    2:15 PM 11/29/2021    1:33 PM  Depression screen PHQ 2/9  Decreased Interest 0 0 0 1 1  Down, Depressed, Hopeless 1 1 1 1 1   PHQ - 2 Score 1 1 1 2 2   Altered sleeping    1 1  Tired, decreased energy    1 1  Change in appetite    0 0  Feeling bad or failure about yourself      1  Trouble concentrating    1 0  Moving slowly or fidgety/restless    0 0  Suicidal thoughts    0 0  PHQ-9 Score    5 5       Assessment & Plan:    Patient Active Problem List   Diagnosis Date Noted   Lower abdominal pain 05/28/2022   Post-nasal drip 05/28/2022   Plantar fasciitis, right 03/19/2022   Numbness and tingling in both hands 03/19/2022   Poor social situation 10/25/2021   Axillary abscess 09/20/2021   Therapeutic drug monitoring 08/24/2021   Birth control counseling 12/20/2020   Hypertension 09/21/2020   Grief 08/29/2020   Encounter for initial prescription of injectable contraceptive 07/29/2020   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   Screening for cervical cancer 04/21/2020    Screening for STDs (sexually transmitted diseases) 04/21/2020   Costochondritis 02/19/2020   Bilateral hip pain 02/19/2020   Elbow pain, right 12/04/2019   Housing problems 11/23/2019   Mental health disorder 10/12/2019   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   Paresthesias 10/12/2019   Healthcare maintenance 08/05/2019   HIV (human immunodeficiency virus infection) ([place]) 12/31/2018   Anxiety 05/29/2017   Self-mutilation 05/29/2017   Depression 06/07/2016   Marijuana use 06/07/2016   Low vitamin D level 03/15/2016   Prediabetes 02/22/2016   Asthma 02/22/2016   ADD (attention deficit disorder) 02/22/2016   Methamphetamine use disorder, severe, dependence ([place]) 02/22/2016   Hyperlipidemia with target LDL less than 70 01/31/2012   Migraine 01/31/2012   Tremor, 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     Problem List Items Addressed This Visit       Respiratory   Asthma   Relevant Medications   albuterol (VENTOLIN HFA) 108 (90 Base) MCG/ACT inhaler     Other   HIV (human immunodeficiency virus infection) ([place]) - Primary (Chronic)    Sanyah continues to have well-controlled virus with good adherence and tolerance to [employer].  Reviewed lab work and discussed plan of care.  Continue current dose of Biktarvy.  Plan for follow-up in 1 month or sooner if needed.      Relevant Medications   bictegravir-emtricitabine-tenofovir AF (BIKTARVY) 50-200-25 MG TABS tablet   Methamphetamine use disorder, severe, dependence ([place])    Demetrus continues to use methamphetamine with last use being earlier today. Counseled about continued drug use the potential for complications and adverse effects.       Healthcare maintenance    Discussed importance of safe sexual practice and condom use. Condoms and STD testing offered.        Lower abdominal pain    Kailie has lower abdominal pain that  appears to be slightly lower than abdomen and may be more pelvic in origin. No red flag symptoms and appears to be chronic in  nature that waxes and wanes. Will check [place] of abdomen and pelvis for underlying pathology. May need additional work up pending findings of imaging. Encouraged to test foods to see if she has reactions to them.       Relevant Orders   US PELVIC COMPLETE WITH TRANSVAGINAL   Post-nasal drip    Tecla has post-nasal drip likely starting with seasonal allergies and progressed. No fevers but concern for underlying infection given shortness of breath and non-improving symptoms. Start Augmentin. Continue over the counter medications as needed for symptom relief and supportive care. Follow up if symptoms worsen or do not improve.         I have discontinued Nicola Girt. [place]'s cetirizine and glipiZIDE. I have also changed her Ventolin HFA to albuterol. Additionally, I am having her start on amoxicillin-clavulanate and levocetirizine. Lastly, I am having her maintain her hydrocortisone cream, naproxen, cyclobenzaprine, omeprazole, hydrochlorothiazide, fluconazole, acetaminophen, topiramate, valACYclovir, fluconazole, diclofenac Sodium, and bictegravir-emtricitabine-tenofovir AF.   Meds ordered this encounter  Medications   amoxicillin-clavulanate (AUGMENTIN) 875-125 MG tablet    Sig: Take 1 tablet by mouth 2 (two) times daily.    Dispense:  20 tablet    Refill:  0    Order Specific Question:   Supervising Provider    Answer:   Baxter Flattery, CYNTHIA [[id number]]   albuterol (VENTOLIN HFA) 108 (90 Base) MCG/ACT inhaler    Sig: INHALE 1 PUFF INTO THE LUNGS EVERY 4 HOURS AS NEEDED.    Dispense:  18 each    Refill:  2    Order Specific Question:   Supervising Provider    Answer:   Baxter Flattery, CYNTHIA [[id number]]   levocetirizine (XYZAL ALLERGY 24HR) 5 MG tablet    Sig: Take 1 tablet (5 mg total) by mouth every evening.    Dispense:  30 tablet    Refill:  5    Order Specific Question:   Supervising Provider    Answer:   Baxter Flattery, CYNTHIA [[id number]]   bictegravir-emtricitabine-tenofovir AF (BIKTARVY) 50-200-25 MG TABS tablet     Sig: Take 1 tablet by mouth daily.    Dispense:  30 tablet    Refill:  5    Order Specific Question:   Supervising Provider    Answer:   Carlyle Basques [[id number]]     Follow-up: Return in about 1 month (around 06/28/2022), or if symptoms worsen or fail to improve.   Terri Piedra, MSN, FNP-C Nurse Practitioner [facility] [place] number: [phone]

## 2022-05-28 NOTE — Assessment & Plan Note (Signed)
[place] has post-nasal drip likely starting with seasonal allergies and progressed. No fevers but concern for underlying infection given shortness of breath and non-improving symptoms. Start Augmentin. Continue over the counter medications as needed for symptom relief and supportive care. Follow up if symptoms worsen or do not improve.

## 2022-05-28 NOTE — Patient Instructions (Addendum)
Nice to see you.  Continue to take your medication daily as prescribed.  Refills have been sent to the pharmacy.  Plan for follow up in 1 months or sooner if needed with lab work on the same day.  Have a great day and stay safe!  Plantar Fasciitis Rehab Ask your health care provider which exercises are safe for you. Do exercises exactly as told by your health care provider and adjust them as directed. It is normal to feel mild stretching, pulling, tightness, or discomfort as you do these exercises. Stop right away if you feel sudden pain or your pain gets worse. Do not begin these exercises until told by your health care provider. Stretching and range-of-motion exercises These exercises warm up your muscles and joints and improve the movement and flexibility of your foot. These exercises also help to relieve pain. Plantar fascia stretch  Sit with your left / right leg crossed over your opposite knee. Hold your heel with one hand with that thumb near your arch. With your other hand, hold your toes and gently pull them back toward the top of your foot. You should feel a stretch on the base (bottom) of your toes, or the bottom of your foot (plantar fascia), or both. Hold this stretch for__________ seconds. Slowly release your toes and return to the starting position. Repeat __________ times. Complete this exercise __________ times a day. Gastrocnemius stretch, standing This exercise is also called a calf (gastroc) stretch. It stretches the muscles in the back of the upper calf. Stand with your hands against a wall. Extend your left / right leg behind you, and bend your front knee slightly. Keeping your heels on the floor, your toes facing forward, and your back knee straight, shift your weight toward the wall. Do not arch your back. You should feel a gentle stretch in your upper calf. Hold this position for __________ seconds. Repeat __________ times. Complete this exercise __________ times a  day. Soleus stretch, standing This exercise is also called a calf (soleus) stretch. It stretches the muscles in the back of the lower calf. Stand with your hands against a wall. Extend your left / right leg behind you, and bend your front knee slightly. Keeping your heels on the floor and your toes facing forward, bend your back knee and shift your weight slightly over your back leg. You should feel a gentle stretch deep in your lower calf. Hold this position for __________ seconds. Repeat __________ times. Complete this exercise __________ times a day. Gastroc and soleus stretch, standing step This exercise stretches the muscles in the back of the lower leg. These muscles are in the upper calf (gastrocnemius) and the lower calf (soleus). Stand with the ball of your left / right foot on the front of a step. The ball of your foot is on the walking surface, right under your toes. Keep your other foot firmly on the same step. Hold on to the wall or a railing for balance. Slowly lift your other foot, allowing your body weight to press your heel down over the edge of the front of the step. Keep knee straight and unbent. You should feel a stretch in your calf. Hold this position for __________ seconds. Return both feet to the step. Repeat this exercise with a slight bend in your left / right knee. Repeat __________ times with your left / right knee straight and __________ times with your left / right knee bent. Complete this exercise __________ times a day.  Balance exercise This exercise builds your balance and strength control of your arch to help take pressure off your plantar fascia. Single leg stand If this exercise is too easy, you can try it with your eyes closed or while standing on a pillow. Without shoes, stand near a railing or in a doorway. You may hold on to the railing or door frame as needed. Stand on your left / right foot. Keep your big toe down on the floor and lift the arch of your  foot. You should feel a stretch across the bottom of your foot and your arch. Do not let your foot roll inward. Hold this position for __________ seconds. Repeat __________ times. Complete this exercise __________ times a day. This information is not intended to replace advice given to you by your health care provider. Make sure you discuss any questions you have with your health care provider. Document Revised: 12/10/2019 Document Reviewed: 12/10/2019 Elsevier Patient Education  [place].

## 2022-05-28 NOTE — Assessment & Plan Note (Signed)
Phila has lower abdominal pain that appears to be slightly lower than abdomen and may be more pelvic in origin. No red flag symptoms and appears to be chronic in nature that waxes and wanes. Will check [place] of abdomen and pelvis for underlying pathology. May need additional work up pending findings of imaging. Encouraged to test foods to see if she has reactions to them.

## 2022-05-30 ENCOUNTER — Ambulatory Visit: Payer: Medicaid Other | Admitting: Family

## 2022-06-01 ENCOUNTER — Ambulatory Visit (HOSPITAL_COMMUNITY): Admission: RE | Admit: 2022-06-01 | Payer: Medicaid Other | Source: Ambulatory Visit

## 2022-06-17 ENCOUNTER — Encounter (HOSPITAL_COMMUNITY): Payer: Self-pay

## 2022-06-17 ENCOUNTER — Ambulatory Visit (HOSPITAL_COMMUNITY)
Admission: EM | Admit: 2022-06-17 | Discharge: 2022-06-17 | Disposition: A | Discharge status: Home / Self Care | Payer: Medicaid Other | Attending: Family Medicine | Admitting: Family Medicine

## 2022-06-17 DIAGNOSIS — B379 Candidiasis, unspecified: Secondary | ICD-10-CM

## 2022-06-17 DIAGNOSIS — L03315 Cellulitis of perineum: Secondary | ICD-10-CM | POA: Diagnosis not present

## 2022-06-17 MED ORDER — SULFAMETHOXAZOLE-TRIMETHOPRIM 800-160 MG PO TABS: 1.0000 | ORAL_TABLET | Freq: Two times a day (BID) | ORAL | 0 refills | Status: AC

## 2022-06-17 MED ORDER — NYSTATIN 100000 UNIT/GM EX CREA: TOPICAL_CREAM | CUTANEOUS | 0 refills | Status: AC

## 2022-06-17 MED ORDER — MUPIROCIN 2 % EX OINT: 1.0000 | TOPICAL_OINTMENT | Freq: Two times a day (BID) | CUTANEOUS | 0 refills | Status: DC

## 2022-06-17 NOTE — Discharge Instructions (Signed)
Take sulfa meth oxazole-trimethoprim 800-160 mg--1 tablet 2 times daily for 7 days.  Put mupirocin ointment on the sore areas twice daily until improved  Apply nystatin cream to the inner parts of your bottom that are irritated and that seems to be a yeast infection.

## 2022-06-17 NOTE — ED Provider Notes (Signed)
[facility]    CSN: [id number] Arrival date & time: 06/17/22  1341      History   Chief Complaint Chief Complaint  Patient presents with   SEXUALLY TRANSMITTED DISEASE    HPI Kristin Hart is a 42 y.o. female.   HPI Here for a sore on her posterior right leg.  Is been bothering her for couple of weeks  She also has some bumps on her perineum that she thinks might be herpes.  They do sometimes drain.  She does take valacyclovir regularly to prevent herpes outbreaks.  Past medical history significant for HIV, with an undetectable viral load.  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, per pt   Diabetes mellitus without complication    type 2   Foreign body in small intestine    HIV (human immunodeficiency virus infection)    Hypothyroidism    Stomach ulcer     Patient Active Problem List   Diagnosis Date Noted   Lower abdominal pain 05/28/2022   Post-nasal drip 05/28/2022   Plantar fasciitis, right 03/19/2022   Numbness and tingling in both hands 03/19/2022   Poor social situation 10/25/2021   Axillary abscess 09/20/2021   Therapeutic drug monitoring 08/24/2021   Birth control counseling 12/20/2020   Hypertension 09/21/2020   Grief 08/29/2020   Encounter for initial prescription of injectable contraceptive 07/29/2020   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   Screening for cervical cancer 04/21/2020   Screening for STDs (sexually transmitted diseases) 04/21/2020   Costochondritis 02/19/2020   Bilateral hip pain 02/19/2020   Elbow pain, right 12/04/2019   Housing problems 11/23/2019   Mental health disorder 10/12/2019   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   Paresthesias 10/12/2019   Healthcare maintenance 08/05/2019   HIV (human immunodeficiency virus infection) 12/31/2018   Anxiety 05/29/2017   Self-mutilation 05/29/2017   Depression 06/07/2016    Marijuana use 06/07/2016   Low vitamin D level 03/15/2016   Prediabetes 02/22/2016   Asthma 02/22/2016   ADD (attention deficit disorder) 02/22/2016   Methamphetamine use disorder, severe, 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   Hyperlipidemia with target LDL less than 70 01/31/2012   Migraine 01/31/2012   Tremor, 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    Past Surgical History:  Procedure Laterality Date   CESAREAN SECTION N/A 07/04/2016   Procedure: CESAREAN SECTION;  Surgeon: [name], DO;  Location: [facility] BIRTHING SUITES;  Service: Obstetrics;  Laterality: N/A;   ENTEROSCOPY N/A 11/08/2017   Procedure: ENTEROSCOPY;  Surgeon: Hilarie Fredrickson, MD;  Location: [facility] ENDOSCOPY;  Service: Endoscopy;  Laterality: N/A;   NO PAST SURGERIES      OB History     Gravida  1   Para  1   Term  1   Preterm      AB      Living         SAB      IAB      Ectopic      Multiple  0   Live Births               Home Medications    Prior to Admission medications   Medication Sig Start Date End Date Taking? Authorizing Provider  bictegravir-emtricitabine-tenofovir AF (BIKTARVY) 50-200-25 MG TABS tablet Take 1 tablet by mouth daily. 05/28/22  Yes Veryl Speak, FNP  cyclobenzaprine (FLEXERIL) 5 MG tablet Take 1 tablet (5  mg total) by mouth at bedtime. 12/21/21  Yes Veryl Speakalone, Gregory D, FNP  diclofenac Sodium (VOLTAREN) 1 % GEL APPLY 2 GRAMS TOPICALLY TO THE AFFECTED AREA FOUR TIMES DAILY AS NEEDED FOR ELBOW OR PAIN OR HIP PAIN 05/28/22  Yes Veryl Speakalone, Gregory D, FNP  hydrochlorothiazide (HYDRODIURIL) 12.5 MG tablet TAKE 1 TABLET(12.5 MG) BY MOUTH DAILY 01/15/22  Yes Veryl Speakalone, Gregory D, FNP  hydrocortisone cream 1 % Apply to affected area 2 times daily 08/17/18  Yes Henderly, Britni A, PA-C  levocetirizine (XYZAL ALLERGY 24HR) 5 MG tablet Take 1 tablet (5 mg total) by mouth every evening. 05/28/22  Yes Veryl Speakalone, Gregory D, FNP  mupirocin ointment (BACTROBAN) 2 % Apply 1 Application topically 2 (two) times daily. To affected area  till better 06/17/22  Yes Sylvi Rybolt, Janace ArisPamela K, MD  nystatin cream (MYCOSTATIN) Apply to affected area 2 times daily 06/17/22  Yes Noma Quijas, Janace ArisPamela K, MD  omeprazole (PRILOSEC) 40 MG capsule Take 1 capsule (40 mg total) by mouth daily. 12/21/21  Yes Veryl Speakalone, Gregory D, FNP  sulfamethoxazole-trimethoprim (BACTRIM DS) 800-160 MG tablet Take 1 tablet by mouth 2 (two) times daily for 7 days. 06/17/22 06/24/22 Yes Zenia ResidesBanister, Mikka Kissner K, MD  topiramate (TOPAMAX) 50 MG tablet Take 1 tablet (50 mg total) by mouth daily. 03/19/22  Yes Blanchard Kelchixon, Stephanie N, NP  valACYclovir (VALTREX) 500 MG tablet TAKE 1 TABLET(500 MG) BY MOUTH TWICE DAILY 05/09/22  Yes Veryl Speakalone, Gregory D, FNP  acetaminophen (TYLENOL) 500 MG tablet Take 1 tablet (500 mg total) by mouth every 6 (six) hours as needed. 03/06/22   Debby FreibergWedderburn, Ngozi N, NP  albuterol (VENTOLIN HFA) 108 (90 Base) MCG/ACT inhaler INHALE 1 PUFF INTO THE LUNGS EVERY 4 HOURS AS NEEDED. 05/28/22   Veryl Speakalone, Gregory D, FNP    Family History Family History  Problem Relation Age of Onset   Heart disease Mother    Arthritis Mother    Depression Mother    Bipolar disorder Mother    Cancer Father    Depression Father    Bipolar disorder Father    Alzheimer's disease Father     Social History Social History   Tobacco Use   Smoking status: Never   Smokeless tobacco: Never  Vaping Use   Vaping Use: Never used  Substance Use Topics   Alcohol use: No   Drug use: Yes    Types: Methamphetamines    Comment: states she does Crystal almost daily     Allergies   Patient has no known allergies.   Review of Systems Review of Systems   Physical Exam Triage Vital Signs ED Triage Vitals  Enc Vitals Group     BP 06/17/22 1515 (!) 151/100     Pulse Rate 06/17/22 1515 (!) 110     Resp 06/17/22 1515 16     Temp 06/17/22 1515 98 F (36.7 C)     Temp Source 06/17/22 1515 Oral     SpO2 06/17/22 1515 98 %     Weight --      Height --      Head Circumference --      Peak Flow --       Pain Score 06/17/22 1518 8     Pain Loc --      Pain Edu? --      Excl. in GC? --    No data found.  Updated Vital Signs BP (!) 151/100 (BP Location: Right Arm)   Pulse (!) 110   Temp 98 F (36.7 C) (  Oral)   Resp 16   LMP  (LMP Unknown)   SpO2 98%   Visual Acuity Right Eye Distance:   Left Eye Distance:   Bilateral Distance:    Right Eye Near:   Left Eye Near:    Bilateral Near:     Physical Exam Vitals reviewed.  Constitutional:      General: She is not in acute distress.    Appearance: She is not ill-appearing, toxic-appearing or diaphoretic.  Genitourinary:    Comments: On her labia majora there are several erythematous bumps.  Those erythematous bumps are about 3 to 4 mm in diameter but each is surrounded with induration about 2 cm in diameter.  There is also erythema and irritation of the labia minora Skin:    Coloration: Skin is not jaundiced or pale.     Comments: There is an open wound about 1 cm in diameter on her right posterior thigh near the knee.  There is minimal induration and erythema around it.  There is no drainage.  Neurological:     General: No focal deficit present.     Mental Status: She is alert and oriented to person, place, and time.  Psychiatric:        Behavior: Behavior normal.      UC Treatments / Results  Labs (all labs ordered are listed, but only abnormal results are displayed) Labs Reviewed - No data to display  EKG   Radiology No results found.  Procedures Procedures (including critical care time)  Medications Ordered in UC Medications - No data to display  Initial Impression / Assessment and Plan / UC Course  I have reviewed the triage vital signs and the nursing notes.  Pertinent labs & imaging results that were available during my care of the patient were reviewed by me and considered in my medical decision making (see chart for details).        I am going to treat for bacterial infection and cellulitis.   Also mupirocin is sent in, especially for the lesion on her leg.  I have reassured her that I do not think that this is herpes on her perineum.  I do think Candida is playing a part in the irritation of her labia minora, so topical nystatin is sent in for that. She is given contact information for dermatology. Final Clinical Impressions(s) / UC Diagnoses   Final diagnoses:  Cellulitis of perineum  Candidiasis     Discharge Instructions      Take sulfa meth oxazole-trimethoprim 800-160 mg--1 tablet 2 times daily for 7 days.  Put mupirocin ointment on the sore areas twice daily until improved  Apply nystatin cream to the inner parts of your bottom that are irritated and that seems to be a yeast infection.     ED Prescriptions     Medication Sig Dispense Auth. Provider   mupirocin ointment (BACTROBAN) 2 % Apply 1 Application topically 2 (two) times daily. To affected area till better 22 g Zenia Resides, MD   sulfamethoxazole-trimethoprim (BACTRIM DS) 800-160 MG tablet Take 1 tablet by mouth 2 (two) times daily for 7 days. 14 tablet Lamonda Noxon, Janace Aris, MD   nystatin cream (MYCOSTATIN) Apply to affected area 2 times daily 30 g Marlinda Mike, Janace Aris, MD      PDMP not reviewed this encounter.   Zenia Resides, MD 06/17/22 [phone]

## 2022-06-17 NOTE — ED Triage Notes (Signed)
Pt is here for herpes outbreak , possible rash on back of right leg x 1wk .

## 2022-06-26 ENCOUNTER — Ambulatory Visit: Payer: Medicaid Other | Admitting: Family

## 2022-06-29 ENCOUNTER — Other Ambulatory Visit: Payer: Self-pay | Admitting: Family

## 2022-06-29 DIAGNOSIS — K219 Gastro-esophageal reflux disease without esophagitis: Secondary | ICD-10-CM

## 2022-07-03 ENCOUNTER — Ambulatory Visit: Payer: Medicaid Other | Admitting: Family

## 2022-07-05 ENCOUNTER — Other Ambulatory Visit: Payer: Self-pay

## 2022-07-05 ENCOUNTER — Encounter: Payer: Self-pay | Admitting: Family

## 2022-07-05 ENCOUNTER — Ambulatory Visit (INDEPENDENT_AMBULATORY_CARE_PROVIDER_SITE_OTHER): Payer: Medicaid Other | Admitting: Family

## 2022-07-05 ENCOUNTER — Emergency Department (HOSPITAL_COMMUNITY)
Admission: EM | Admit: 2022-07-05 | Discharge: 2022-07-05 | Discharge status: Against Medical Advice | Payer: Medicaid Other | Attending: Emergency Medicine | Admitting: Emergency Medicine

## 2022-07-05 VITALS — Ht 67.0 in | Wt 162.0 lb

## 2022-07-05 DIAGNOSIS — R030 Elevated blood-pressure reading, without diagnosis of hypertension: Secondary | ICD-10-CM | POA: Diagnosis not present

## 2022-07-05 DIAGNOSIS — R202 Paresthesia of skin: Secondary | ICD-10-CM

## 2022-07-05 DIAGNOSIS — R109 Unspecified abdominal pain: Secondary | ICD-10-CM | POA: Diagnosis not present

## 2022-07-05 DIAGNOSIS — F1520 Other stimulant dependence, uncomplicated: Secondary | ICD-10-CM

## 2022-07-05 DIAGNOSIS — I10 Essential (primary) hypertension: Secondary | ICD-10-CM | POA: Diagnosis not present

## 2022-07-05 DIAGNOSIS — R110 Nausea: Secondary | ICD-10-CM | POA: Diagnosis not present

## 2022-07-05 DIAGNOSIS — Z21 Asymptomatic human immunodeficiency virus [HIV] infection status: Secondary | ICD-10-CM | POA: Diagnosis not present

## 2022-07-05 DIAGNOSIS — Z5321 Procedure and treatment not carried out due to patient leaving prior to being seen by health care provider: Secondary | ICD-10-CM | POA: Diagnosis not present

## 2022-07-05 DIAGNOSIS — R1084 Generalized abdominal pain: Secondary | ICD-10-CM | POA: Diagnosis not present

## 2022-07-05 DIAGNOSIS — R200 Anesthesia of skin: Secondary | ICD-10-CM | POA: Diagnosis not present

## 2022-07-05 DIAGNOSIS — R1010 Upper abdominal pain, unspecified: Secondary | ICD-10-CM | POA: Diagnosis not present

## 2022-07-05 MED ORDER — HYDROCHLOROTHIAZIDE 12.5 MG PO TABS
ORAL_TABLET | ORAL | 1 refills | Status: DC
Start: 2022-07-05 — End: 2023-03-25

## 2022-07-05 NOTE — ED Notes (Signed)
Patient refused lab work. "States she just wants to go home." Triage RN made aware.

## 2022-07-05 NOTE — Patient Instructions (Signed)
Nice to see you.  Continue to take your medication daily as prescribed.  Refills have been sent to the pharmacy.  Plan for follow up in 1 months or sooner if needed with lab work on the same day.  Have a great day and stay safe!  

## 2022-07-05 NOTE — ED Triage Notes (Signed)
BIBA from a clinic for abdominal pain. 200 cc IVF,  Zofran given PTA 160/110 BP 70 HR sinu 330 cbg 20g left ac

## 2022-07-05 NOTE — Assessment & Plan Note (Signed)
Danene reports improvement in these symptoms. Has not followed with Neurology. Recommend continued work up if symptoms worsen or do not improve.

## 2022-07-05 NOTE — ED Notes (Signed)
Patient said it is her son's 36th birthday and she needs to leave. She does not want to wait even though her condition may worsen.

## 2022-07-05 NOTE — Progress Notes (Signed)
Brief Narrative   Patient ID: Kristin Hart, female    DOB: 09-Mar-1981, 42 y.o.   MRN: [id number]  Kristin Hart is a 42 y/o caucasian female diagnosed with HIV disease on 01/02/19 with risk factor of heterosexual contact and IV drug use. Initial viral load on 06/23/19 23,300 and CD4 count 577. Genotype with no significant drug resistance. Entered care at [facility] Stage 1. [id number] negative. No history of opportunistic infection. Sole medication regimen of Biktarvy   Subjective:    Chief Complaint  Patient presents with   Follow-up    B20    HPI:  Kristin Hart is a 42 y.o. female with HIV disease last seen on 05/28/22 with well controlled virus and good adherence and tolerance to [employer]. Here today for routine follow up.  Kristin Hart has been doing okay since her last office visit. Living situation remains the same and she continues to have waxing and waning relationship with her son's father. Son is turning 6 today and has a birthday party on Sunday which she is excited about. Has a friend that is now in a coma after an altercation and is concerned as she just saw him driving the other day.  Attempted to reach out to her parents but did not receive a response. Has a sunburn on her legs and numbness and tingling in her bilateral upper extremities has improved some. Continues to take Biktarvy as prescribed with no adverse side effects or problems obtaining medication from the pharmacy. Condoms and STD testing offered. Due for depo-provera at next office visit.  Denies fevers, chills, night sweats, headaches, changes in vision, neck pain/stiffness, nausea, diarrhea, vomiting, lesions or rashes.   No Known Allergies    Outpatient Medications Prior to Visit  Medication Sig Dispense Refill   acetaminophen (TYLENOL) 500 MG tablet Take 1 tablet (500 mg total) by mouth every 6 (six) hours as needed. 30 tablet 0   albuterol (VENTOLIN HFA) 108 (90 Base) MCG/ACT inhaler INHALE 1 PUFF INTO THE LUNGS  EVERY 4 HOURS AS NEEDED. 18 each 2   bictegravir-emtricitabine-tenofovir AF (BIKTARVY) 50-200-25 MG TABS tablet Take 1 tablet by mouth daily. 30 tablet 5   cyclobenzaprine (FLEXERIL) 5 MG tablet Take 1 tablet (5 mg total) by mouth at bedtime. 30 tablet 4   diclofenac Sodium (VOLTAREN) 1 % GEL APPLY 2 GRAMS TOPICALLY TO THE AFFECTED AREA FOUR TIMES DAILY AS NEEDED FOR ELBOW OR PAIN OR HIP PAIN 200 g 0   hydrocortisone cream 1 % Apply to affected area 2 times daily 15 g 0   levocetirizine (XYZAL ALLERGY 24HR) 5 MG tablet Take 1 tablet (5 mg total) by mouth every evening. 30 tablet 5   mupirocin ointment (BACTROBAN) 2 % Apply 1 Application topically 2 (two) times daily. To affected area till better 22 g 0   nystatin cream (MYCOSTATIN) Apply to affected area 2 times daily 30 g 0   omeprazole (PRILOSEC) 40 MG capsule TAKE 1 CAPSULE(40 MG) BY MOUTH DAILY 90 capsule 1   topiramate (TOPAMAX) 50 MG tablet Take 1 tablet (50 mg total) by mouth daily. 30 tablet 2   valACYclovir (VALTREX) 500 MG tablet TAKE 1 TABLET(500 MG) BY MOUTH TWICE DAILY 60 tablet 2   hydrochlorothiazide (HYDRODIURIL) 12.5 MG tablet TAKE 1 TABLET(12.5 MG) BY MOUTH DAILY 90 tablet 1   No facility-administered medications prior to visit.     Past Medical History:  Diagnosis Date   Asthma    Borderline personality disorder  with schizophrenic tendancies, per pt   Diabetes mellitus without complication    type 2   Foreign body in small intestine    HIV (human immunodeficiency virus infection)    Hypothyroidism    Stomach ulcer      Past Surgical History:  Procedure Laterality Date   CESAREAN SECTION N/A 07/04/2016   Procedure: CESAREAN SECTION;  Surgeon: [name], DO;  Location: [facility] BIRTHING SUITES;  Service: Obstetrics;  Laterality: N/A;   ENTEROSCOPY N/A 11/08/2017   Procedure: ENTEROSCOPY;  Surgeon: Hilarie Fredrickson, MD;  Location: [facility] ENDOSCOPY;  Service: Endoscopy;  Laterality: N/A;   NO PAST SURGERIES         Review of Systems  Constitutional:  Negative for appetite change, chills, diaphoresis, fatigue, fever and unexpected weight change.  Eyes:        Negative for acute change in vision  Respiratory:  Negative for chest tightness, shortness of breath and wheezing.   Cardiovascular:  Negative for chest pain.  Gastrointestinal:  Negative for diarrhea, nausea and vomiting.  Genitourinary:  Negative for dysuria, pelvic pain and vaginal discharge.  Musculoskeletal:  Negative for neck pain and neck stiffness.  Skin:  Negative for rash.  Neurological:  Negative for seizures, syncope, weakness and headaches.  Hematological:  Negative for adenopathy. Does not bruise/bleed easily.  Psychiatric/Behavioral:  Negative for hallucinations.       Objective:    Ht  (1.702 m)   Wt 162 lb (73.5 kg)   LMP  (LMP Unknown)   BMI 25.37 kg/m  Nursing note and vital signs reviewed.  Physical Exam Constitutional:      General: She is not in acute distress.    Appearance: She is well-developed.  Eyes:     Conjunctiva/sclera: Conjunctivae normal.  Cardiovascular:     Rate and Rhythm: Normal rate and regular rhythm.     Heart sounds: Normal heart sounds. No murmur heard.    No friction rub. No gallop.  Pulmonary:     Effort: Pulmonary effort is normal. No respiratory distress.     Breath sounds: Normal breath sounds. No wheezing or rales.  Chest:     Chest wall: No tenderness.  Abdominal:     General: Bowel sounds are normal.     Palpations: Abdomen is soft.     Tenderness: There is no abdominal tenderness.  Musculoskeletal:     Cervical back: Neck supple.  Lymphadenopathy:     Cervical: No cervical adenopathy.  Skin:    General: Skin is warm and dry.     Findings: No rash.  Neurological:     Mental Status: She is alert.  Psychiatric:        Mood and Affect: Mood normal.         07/05/2022   10:51 AM 05/01/2022    3:46 PM 03/19/2022    3:19 PM 01/25/2022    3:18 PM 12/21/2021     2:15 PM  Depression screen PHQ 2/9  Decreased Interest  0 0 0 1  Down, Depressed, Hopeless PHQ - 2 Score Altered sleeping     1  Tired, decreased energy     1  Change in appetite     0  Trouble concentrating     1  Moving slowly or fidgety/restless     0  Suicidal thoughts     0  PHQ-9 Score     5  Assessment & Plan:    Patient Active Problem List   Diagnosis Date Noted   Lower abdominal pain 05/28/2022   Post-nasal drip 05/28/2022   Plantar fasciitis, right 03/19/2022   Numbness and tingling in both hands 03/19/2022   Poor social situation 10/25/2021   Axillary abscess 09/20/2021   Therapeutic drug monitoring 08/24/2021   Birth control counseling 12/20/2020   Hypertension 09/21/2020   Grief 08/29/2020   Encounter for initial prescription of injectable contraceptive 07/29/2020   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   Screening for cervical cancer 04/21/2020   Screening for STDs (sexually transmitted diseases) 04/21/2020   Costochondritis 02/19/2020   Bilateral hip pain 02/19/2020   Elbow pain, right 12/04/2019   Housing problems 11/23/2019   Mental health disorder 10/12/2019   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   Paresthesias 10/12/2019   Healthcare maintenance 08/05/2019   HIV (human immunodeficiency virus infection) 12/31/2018   Anxiety 05/29/2017   Self-mutilation 05/29/2017   Depression 06/07/2016   Marijuana use 06/07/2016   Low vitamin D level 03/15/2016   Prediabetes 02/22/2016   Asthma 02/22/2016   ADD (attention deficit disorder) 02/22/2016   Methamphetamine use disorder, severe, 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 Reviewed plan of care and U=U. Continue current dose of Biktarvy.       Methamphetamine use disorder, severe, dependence    Madelena continues to use methamphetamine with the most recent use this morning. Continues to work with Kristin Hart as well. Does not appear ready for rehabilitation at this point.       Numbness and tingling in both hands    Kristin Hart reports improvement in these symptoms. Has not followed with Neurology. Recommend continued work up if symptoms worsen or do not improve.       Other Visit Diagnoses     Elevated BP without diagnosis of hypertension       Relevant Medications   hydrochlorothiazide (HYDRODIURIL) 12.5 MG tablet        I am having Alvan Dame. Mcwatters maintain her hydrocortisone cream, cyclobenzaprine, acetaminophen, topiramate, valACYclovir, diclofenac Sodium, albuterol, levocetirizine, bictegravir-emtricitabine-tenofovir AF, mupirocin ointment, nystatin cream, omeprazole, and hydrochlorothiazide.   Meds ordered this encounter  Medications   hydrochlorothiazide (HYDRODIURIL) 12.5 MG tablet    Sig: TAKE 1 TABLET(12.5 MG) BY MOUTH DAILY    Dispense:  90 tablet    Refill:  1    Order Specific Question:   Supervising Provider    Answer:   Judyann Munson [[id number]]     Follow-up: Return in about 1 month (around 08/04/2022), or if symptoms worsen or fail to improve.   Marcos Eke, MSN, FNP-C Nurse Practitioner [facility] Main number: [phone]

## 2022-07-05 NOTE — Assessment & Plan Note (Signed)
Shatora continues to use methamphetamine with the most recent use this morning. Continues to work with [name] as well. Does not appear ready for rehabilitation at this point.

## 2022-07-05 NOTE — Assessment & Plan Note (Signed)
Kristin Hart continues to have well controlled virus with good adherence and tolerance to [employer]. Reviewed plan of care and U=U. Continue current dose of Biktarvy.

## 2022-07-06 ENCOUNTER — Encounter (HOSPITAL_COMMUNITY): Payer: Self-pay

## 2022-07-06 ENCOUNTER — Ambulatory Visit (HOSPITAL_COMMUNITY)
Admission: EM | Admit: 2022-07-06 | Discharge: 2022-07-06 | Disposition: A | Discharge status: Home / Self Care | Payer: Medicaid Other | Attending: Nurse Practitioner | Admitting: Nurse Practitioner

## 2022-07-06 DIAGNOSIS — R1084 Generalized abdominal pain: Secondary | ICD-10-CM

## 2022-07-06 LAB — POCT URINALYSIS DIP (MANUAL ENTRY)
Bilirubin, UA: NEGATIVE
Blood, UA: NEGATIVE
Glucose, UA: >=1000 mg/dL — AB
Ketones, POC UA: NEGATIVE mg/dL
Leukocytes, UA: NEGATIVE
Nitrite, UA: NEGATIVE
Protein Ur, POC: NEGATIVE mg/dL
Spec Grav, UA: <=1.005 — AB (ref 1.010–1.025)
Urobilinogen, UA: 1 E.U./dL
pH, UA: 6 (ref 5.0–8.0)

## 2022-07-06 LAB — POCT URINE PREGNANCY: Preg Test, Ur: NEGATIVE

## 2022-07-06 MED ORDER — ALUM & MAG HYDROXIDE-SIMETH 200-200-20 MG/5ML PO SUSP
30.0000 mL | Freq: Once | ORAL | Status: AC
  Administered 2022-07-06: 30 mL via ORAL

## 2022-07-06 MED ORDER — ALUM & MAG HYDROXIDE-SIMETH 200-200-20 MG/5ML PO SUSP
ORAL | Status: AC
  Filled 2022-07-06: qty 30

## 2022-07-06 NOTE — Discharge Instructions (Addendum)
We gave you Maalox/Mylanta today.  You can continue this at home as needed for indigestion.  You can also try Gas-X or simethicone.  If you develop severe pain, fever, nausea/vomiting and are unable to keep fluids down, please go to the emergency room.  There is sugar in your urine, please follow up with a PCP regarding this.

## 2022-07-06 NOTE — ED Provider Notes (Signed)
[facility]    CSN: [id number] Arrival date & time: 07/06/22  1508      History   Chief Complaint Chief Complaint  Patient presents with   Abdominal Pain   Nausea    HPI Kristin Hart is a 42 y.o. female.   Patient presents today for 3-day history of abdominal pain.  Reports the pain onset is sudden, severity is mild compared to when it first started.  She describes the pain as cramping.  The pain lasts a few minutes, then goes all of the way away.  Patient denies radiation of pain to other part of her body.  Patient denies fever, vomiting, diarrhea or constipation, heartburn, dysuria, hematuria, and urinary frequency.  She does endorse a little bit of nausea, bloating, and decreased appetite since the pain began.  Reports that she does have a history of acid reflux for which she takes omeprazole, reports this pain does not feel the same as indigestion pain.  Reports last bowel movement was earlier today, no blood in the stool.  Reports she has been taking Tylenol for the pain without much benefit.  Does take Tylenol, Aleve, or ibuprofen once daily for chronic pain.  Patient denies history of diabetes mellitus.  Reports she follows closely with infectious disease provider who treats chronic illness.    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, per pt   Diabetes mellitus without complication (HCC)    type 2   Foreign body in small intestine    HIV (human immunodeficiency virus infection) (HCC)    Hypothyroidism    Stomach ulcer     Patient Active Problem List   Diagnosis Date Noted   Lower abdominal pain 05/28/2022   Post-nasal drip 05/28/2022   Plantar fasciitis, right 03/19/2022   Numbness and tingling in both hands 03/19/2022   Poor social situation 10/25/2021   Axillary abscess 09/20/2021   Therapeutic drug monitoring 08/24/2021   Birth control counseling 12/20/2020    Hypertension 09/21/2020   Grief 08/29/2020   Encounter for initial prescription of injectable contraceptive 07/29/2020   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   Screening for cervical cancer 04/21/2020   Screening for STDs (sexually transmitted diseases) 04/21/2020   Costochondritis 02/19/2020   Bilateral hip pain 02/19/2020   Elbow pain, right 12/04/2019   Housing problems 11/23/2019   Mental health disorder 10/12/2019   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   Paresthesias 10/12/2019   Healthcare maintenance 08/05/2019   HIV (human immunodeficiency virus infection) (HCC) 12/31/2018   Anxiety 05/29/2017   Self-mutilation 05/29/2017   Depression 06/07/2016   Marijuana use 06/07/2016   Low vitamin D level 03/15/2016   Prediabetes 02/22/2016   Asthma 02/22/2016   ADD (attention deficit disorder) 02/22/2016   Methamphetamine use disorder, severe, 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/22/2016   Hyperlipidemia with target LDL less than 70 01/31/2012   Migraine 01/31/2012   Tremor, 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    Past Surgical History:  Procedure Laterality Date   CESAREAN SECTION N/A 07/04/2016   Procedure: CESAREAN SECTION;  Surgeon: [name], DO;  Location: [facility] BIRTHING SUITES;  Service: Obstetrics;  Laterality: N/A;   ENTEROSCOPY N/A 11/08/2017   Procedure: ENTEROSCOPY;  Surgeon: Hilarie Fredrickson, MD;  Location: [facility] ENDOSCOPY;  Service: Endoscopy;  Laterality: N/A;   NO PAST SURGERIES      OB History     Gravida  1  Para  1   Term  1   Preterm      AB      Living         SAB      IAB      Ectopic      Multiple  0   Live Births               Home Medications    Prior to Admission medications   Medication Sig Start Date End Date Taking? Authorizing Provider  acetaminophen (TYLENOL) 500 MG tablet Take 1 tablet (500 mg total) by mouth every 6 (six) hours as needed. 03/06/22   Debby Freiberg, NP  albuterol (VENTOLIN HFA) 108 (90 Base) MCG/ACT inhaler  INHALE 1 PUFF INTO THE LUNGS EVERY 4 HOURS AS NEEDED. 05/28/22   Veryl Speak, FNP  bictegravir-emtricitabine-tenofovir AF (BIKTARVY) 50-200-25 MG TABS tablet Take 1 tablet by mouth daily. 05/28/22   Veryl Speak, FNP  cyclobenzaprine (FLEXERIL) 5 MG tablet Take 1 tablet (5 mg total) by mouth at bedtime. 12/21/21   Veryl Speak, FNP  diclofenac Sodium (VOLTAREN) 1 % GEL APPLY 2 GRAMS TOPICALLY TO THE AFFECTED AREA FOUR TIMES DAILY AS NEEDED FOR ELBOW OR PAIN OR HIP PAIN 05/28/22   Veryl Speak, FNP  hydrochlorothiazide (HYDRODIURIL) 12.5 MG tablet TAKE 1 TABLET(12.5 MG) BY MOUTH DAILY 07/05/22   Veryl Speak, FNP  hydrocortisone cream 1 % Apply to affected area 2 times daily 08/17/18   Henderly, Britni A, PA-C  levocetirizine (XYZAL ALLERGY 24HR) 5 MG tablet Take 1 tablet (5 mg total) by mouth every evening. 05/28/22   Veryl Speak, FNP  mupirocin ointment (BACTROBAN) 2 % Apply 1 Application topically 2 (two) times daily. To affected area till better 06/17/22   Zenia Resides, MD  nystatin cream (MYCOSTATIN) Apply to affected area 2 times daily 06/17/22   Zenia Resides, MD  omeprazole (PRILOSEC) 40 MG capsule TAKE 1 CAPSULE(40 MG) BY MOUTH DAILY 06/29/22   Veryl Speak, FNP  topiramate (TOPAMAX) 50 MG tablet Take 1 tablet (50 mg total) by mouth daily. 03/19/22   Blanchard Kelch, NP  valACYclovir (VALTREX) 500 MG tablet TAKE 1 TABLET(500 MG) BY MOUTH TWICE DAILY 05/09/22   Veryl Speak, FNP    Family History Family History  Problem Relation Age of Onset   Heart disease Mother    Arthritis Mother    Depression Mother    Bipolar disorder Mother    Cancer Father    Depression Father    Bipolar disorder Father    Alzheimer's disease Father     Social History Social History   Tobacco Use   Smoking status: Never   Smokeless tobacco: Never  Vaping Use   Vaping Use: Never used  Substance Use Topics   Alcohol use: No   Drug use: Yes    Types:  Methamphetamines    Comment: states she does Crystal almost daily     Allergies   Patient has no known allergies.   Review of Systems Review of Systems Per HPI  Physical Exam Triage Vital Signs ED Triage Vitals  Enc Vitals Group     BP 07/06/22 1634 132/85     Pulse Rate 07/06/22 1634 90     Resp 07/06/22 1634 16     Temp 07/06/22 1634 97.7 F (36.5 C)     Temp Source 07/06/22 1634 Oral     SpO2 07/06/22 1634 98 %  Weight --      Height --      Head Circumference --      Peak Flow --      Pain Score 07/06/22 1636 9     Pain Loc --      Pain Edu? --      Excl. in GC? --    No data found.  Updated Vital Signs BP 132/85 (BP Location: Right Arm)   Pulse 90   Temp 97.7 F (36.5 C) (Oral)   Resp 16   LMP  (LMP Unknown)   SpO2 98%   Visual Acuity Right Eye Distance:   Left Eye Distance:   Bilateral Distance:    Right Eye Near:   Left Eye Near:    Bilateral Near:     Physical Exam Vitals and nursing note reviewed.  Constitutional:      General: She is not in acute distress.    Appearance: Normal appearance. She is not toxic-appearing.  HENT:     Head: Normocephalic and atraumatic.     Mouth/Throat:     Mouth: Mucous membranes are moist.     Pharynx: Oropharynx is clear.  Eyes:     General: No scleral icterus.    Extraocular Movements: Extraocular movements intact.  Cardiovascular:     Rate and Rhythm: Normal rate and regular rhythm.  Pulmonary:     Effort: Pulmonary effort is normal. No respiratory distress.     Breath sounds: Normal breath sounds. No wheezing, rhonchi or rales.  Abdominal:     General: Abdomen is flat. Bowel sounds are normal. There is no distension.     Palpations: Abdomen is soft.     Tenderness: There is generalized abdominal tenderness. There is no right CVA tenderness, left CVA tenderness, guarding or rebound. Negative signs include Murphy's sign, Rovsing's sign and McBurney's sign.  Musculoskeletal:     Cervical back:  Normal range of motion.  Lymphadenopathy:     Cervical: No cervical adenopathy.  Skin:    General: Skin is warm and dry.     Capillary Refill: Capillary refill takes less than 2 seconds.     Coloration: Skin is not jaundiced or pale.     Findings: No erythema.  Neurological:     Mental Status: She is alert and oriented to person, place, and time.  Psychiatric:        Behavior: Behavior is cooperative.      UC Treatments / Results  Labs (all labs ordered are listed, but only abnormal results are displayed) Labs Reviewed  POCT URINALYSIS DIP (MANUAL ENTRY) - Abnormal; Notable for the following components:      Result Value   Glucose, UA >=1,000 (*)    Spec Grav, UA <=1.005 (*)    All other components within normal limits  POCT URINE PREGNANCY    EKG   Radiology No results found.  Procedures Procedures (including critical care time)  Medications Ordered in UC Medications  alum & mag hydroxide-simeth (MAALOX/MYLANTA) 200-200-20 MG/5ML suspension 30 mL (30 mLs Oral Given 07/06/22 1839)    Initial Impression / Assessment and Plan / UC Course  I have reviewed the triage vital signs and the nursing notes.  Pertinent labs & imaging results that were available during my care of the patient were reviewed by me and considered in my medical decision making (see chart for details).   Patient is well-appearing, normotensive, afebrile, not tachycardic, not tachypneic, oxygenating well on room air.    1. Generalized  abdominal pain Urinalysis today unremarkable for signs of infection There is glucose in the urine-patient states she has never been told his diabetes Recommended follow-up with primary care provider and assistance initiated today GI cocktail given with mild relief; other differentials include gas pain Low suspicion for acute abdomen Patient declines further workup and is requesting discharge paperwork Examination and vital signs are reassuring Recommended close  follow-up with PCP/strict ER precautions discussed with patient   The patient was given the opportunity to ask questions.  All questions answered to their satisfaction.  The patient is in agreement to this plan.    Final Clinical Impressions(s) / UC Diagnoses   Final diagnoses:  Generalized abdominal pain     Discharge Instructions      We gave you Maalox/Mylanta today.  You can continue this at home as needed for indigestion.  You can also try Gas-X or simethicone.  If you develop severe pain, fever, nausea/vomiting and are unable to keep fluids down, please go to the emergency room.  There is sugar in your urine, please follow up with a PCP regarding this.     ED Prescriptions   None    PDMP not reviewed this encounter.   Valentino Nose, NP 07/06/22 2103

## 2022-07-06 NOTE — ED Triage Notes (Signed)
Patient reports that she has generalized abdominal pain, bloating, and excessive "burping" x 3 days. Patient reports that she was [facility] and EMS called and she was transported to [place] ED. Patient states she left AMA.

## 2022-07-12 ENCOUNTER — Encounter (HOSPITAL_COMMUNITY): Payer: Self-pay

## 2022-07-29 ENCOUNTER — Other Ambulatory Visit: Payer: Self-pay | Admitting: Family

## 2022-07-29 DIAGNOSIS — R030 Elevated blood-pressure reading, without diagnosis of hypertension: Secondary | ICD-10-CM

## 2022-07-31 ENCOUNTER — Ambulatory Visit: Payer: Medicaid Other | Admitting: Family

## 2022-08-02 ENCOUNTER — Other Ambulatory Visit: Payer: Self-pay

## 2022-08-02 ENCOUNTER — Encounter: Payer: Self-pay | Admitting: Family

## 2022-08-02 ENCOUNTER — Ambulatory Visit (INDEPENDENT_AMBULATORY_CARE_PROVIDER_SITE_OTHER): Payer: Medicaid Other | Admitting: Family

## 2022-08-02 VITALS — BP 139/90 | HR 106 | Temp 98.3°F | Wt 155.0 lb

## 2022-08-02 DIAGNOSIS — I158 Other secondary hypertension: Secondary | ICD-10-CM | POA: Diagnosis not present

## 2022-08-02 DIAGNOSIS — B20 Human immunodeficiency virus [HIV] disease: Secondary | ICD-10-CM | POA: Diagnosis not present

## 2022-08-02 DIAGNOSIS — Z3009 Encounter for other general counseling and advice on contraception: Secondary | ICD-10-CM

## 2022-08-02 DIAGNOSIS — Z0000 Encounter for general adult medical examination without abnormal findings: Secondary | ICD-10-CM

## 2022-08-02 DIAGNOSIS — F1520 Other stimulant dependence, uncomplicated: Secondary | ICD-10-CM | POA: Diagnosis not present

## 2022-08-02 DIAGNOSIS — Z21 Asymptomatic human immunodeficiency virus [HIV] infection status: Secondary | ICD-10-CM

## 2022-08-02 MED ORDER — MUPIROCIN 2 % EX OINT: 1.0000 | TOPICAL_OINTMENT | Freq: Two times a day (BID) | CUTANEOUS | 0 refills | Status: DC

## 2022-08-02 MED ORDER — MEDROXYPROGESTERONE ACETATE 150 MG/ML IM SUSP
150.0000 mg | Freq: Once | INTRAMUSCULAR | Status: AC
Start: 2022-08-02 — End: 2022-08-02
  Administered 2022-08-02: 150 mg via INTRAMUSCULAR

## 2022-08-02 NOTE — Assessment & Plan Note (Signed)
Kristin Hart continues to have well-controlled virus with good adherence and tolerance to [employer].  Reviewed plan of care.  Plan for lab work in 2 to 3 months.  Continue current dose of Biktarvy.  Plan for follow-up in 1 month or sooner if needed.

## 2022-08-02 NOTE — Assessment & Plan Note (Signed)
Discussed importance of safe sexual practice and condom use. Condoms and STD testing offered.  Depo-provera updated.  Due for routine dental care and will work with case manager to schedule appointment.  Due for mammogram and will refer at next office visit.

## 2022-08-02 NOTE — Assessment & Plan Note (Signed)
Due for depo-provera with injection provided with no adverse side effects. Discussed importance of safe sexual practice.

## 2022-08-02 NOTE — Assessment & Plan Note (Signed)
Blood pressure mildly elevated above goal 130/80.  No neurological or ophthalmologic signs/symptoms.  Continue current dose of hydrochlorothiazide.

## 2022-08-02 NOTE — Progress Notes (Signed)
Brief Narrative   Patient ID: Kristin Hart, female    DOB: [birth year], 42 y.o.   MRN: [id number]  Kristin Hart is a 42 y/o caucasian female diagnosed with HIV disease on 01/02/19 with risk factor of heterosexual contact and IV drug use. Initial viral load on 06/23/19 23,300 and CD4 count 577. Genotype with no significant drug resistance. Entered care at [facility] Stage 1. [id number] negative. No history of opportunistic infection. Sole medication regimen of Biktarvy   Subjective:    Chief Complaint  Patient presents with   Follow-up    HPI:  Kristin Hart is a 42 y.o. female with HIV disease last seen on 07/05/2022 with well-controlled virus and good adherence and tolerance to [employer].  Previous viral load was undetectable.  Following her office visit she developed abdominal pain and was transported to the emergency room, however left without being seen.  Here today for routine follow-up.  Kristin Hart has been doing okay since her last office visit no further abdominal pains similar to what she experienced.  Continues to take Biktarvy as prescribed with no adverse side effects.  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  Overall social situation is stable.  Applying for a job with [employer].  Due for Depo-Provera and routine dental care.  Condoms and STD testing offered.  Denies fevers, chills, night sweats, headaches, changes in vision, neck pain/stiffness, nausea, diarrhea, vomiting, lesions or rashes.   No Known Allergies    Outpatient Medications Prior to Visit  Medication Sig Dispense Refill   acetaminophen (TYLENOL) 500 MG tablet Take 1 tablet (500 mg total) by mouth every 6 (six) hours as needed. 30 tablet 0   albuterol (VENTOLIN HFA) 108 (90 Base) MCG/ACT inhaler INHALE 1 PUFF INTO THE LUNGS EVERY 4 HOURS AS NEEDED. 18 each 2   bictegravir-emtricitabine-tenofovir AF (BIKTARVY) 50-200-25 MG TABS tablet Take 1 tablet by mouth daily. 30 tablet 5   cyclobenzaprine (FLEXERIL) 5 MG tablet  Take 1 tablet (5 mg total) by mouth at bedtime. 30 tablet 4   diclofenac Sodium (VOLTAREN) 1 % GEL APPLY 2 GRAMS TOPICALLY TO THE AFFECTED AREA FOUR TIMES DAILY AS NEEDED FOR ELBOW OR PAIN OR HIP PAIN 200 g 0   hydrochlorothiazide (HYDRODIURIL) 12.5 MG tablet TAKE 1 TABLET(12.5 MG) BY MOUTH DAILY 90 tablet 1   hydrocortisone cream 1 % Apply to affected area 2 times daily 15 g 0   levocetirizine (XYZAL ALLERGY 24HR) 5 MG tablet Take 1 tablet (5 mg total) by mouth every evening. 30 tablet 5   nystatin cream (MYCOSTATIN) Apply to affected area 2 times daily 30 g 0   omeprazole (PRILOSEC) 40 MG capsule TAKE 1 CAPSULE(40 MG) BY MOUTH DAILY 90 capsule 1   topiramate (TOPAMAX) 50 MG tablet Take 1 tablet (50 mg total) by mouth daily. 30 tablet 2   valACYclovir (VALTREX) 500 MG tablet TAKE 1 TABLET(500 MG) BY MOUTH TWICE DAILY 60 tablet 2   mupirocin ointment (BACTROBAN) 2 % Apply 1 Application topically 2 (two) times daily. To affected area till better 22 g 0   No facility-administered medications prior to visit.     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, per pt   Diabetes mellitus without complication (HCC)    type 2   Foreign body in small intestine    HIV (human immunodeficiency virus infection) (HCC)    Hypothyroidism    Stomach ulcer  Past Surgical History:  Procedure Laterality Date   CESAREAN SECTION N/A 07/04/2016   Procedure: CESAREAN SECTION;  Surgeon: [name], DO;  Location: [facility] BIRTHING SUITES;  Service: Obstetrics;  Laterality: N/A;   ENTEROSCOPY N/A 11/08/2017   Procedure: ENTEROSCOPY;  Surgeon: Hilarie Fredrickson, MD;  Location: [facility] ENDOSCOPY;  Service: Endoscopy;  Laterality: N/A;   NO PAST SURGERIES        Review of Systems  Constitutional:  Negative for appetite change, chills, diaphoresis, fatigue, fever and unexpected weight change.  Eyes:        Negative for acute change in  vision  Respiratory:  Negative for chest tightness, shortness of breath and wheezing.   Cardiovascular:  Negative for chest pain.  Gastrointestinal:  Negative for diarrhea, nausea and vomiting.  Genitourinary:  Negative for dysuria, pelvic pain and vaginal discharge.  Musculoskeletal:  Negative for neck pain and neck stiffness.  Skin:  Negative for rash.  Neurological:  Negative for seizures, syncope, weakness and headaches.  Hematological:  Negative for adenopathy. Does not bruise/bleed easily.  Psychiatric/Behavioral:  Negative for hallucinations.       Objective:    BP (!) 139/90   Pulse (!) 106   Temp 98.3 F (36.8 C) (Oral)   Wt 155 lb (70.3 kg)   SpO2 97%   BMI 24.28 kg/m  Nursing note and vital signs reviewed.  Physical Exam Constitutional:      General: She is not in acute distress.    Appearance: She is well-developed.  Eyes:     Conjunctiva/sclera: Conjunctivae normal.  Cardiovascular:     Rate and Rhythm: Normal rate and regular rhythm.     Heart sounds: Normal heart sounds. No murmur heard.    No friction rub. No gallop.  Pulmonary:     Effort: Pulmonary effort is normal. No respiratory distress.     Breath sounds: Normal breath sounds. No wheezing or rales.  Chest:     Chest wall: No tenderness.  Abdominal:     General: Bowel sounds are normal.     Palpations: Abdomen is soft.     Tenderness: There is no abdominal tenderness.  Musculoskeletal:     Cervical back: Neck supple.  Lymphadenopathy:     Cervical: No cervical adenopathy.  Skin:    General: Skin is warm and dry.     Findings: No rash.  Neurological:     Mental Status: She is alert and oriented to person, place, and time.  Psychiatric:        Behavior: Behavior normal.        Thought Content: Thought content normal.        Judgment: Judgment normal.         07/05/2022   10:51 AM 05/01/2022    3:46 PM 03/19/2022    3:19 PM 01/25/2022    3:18 PM 12/21/2021    2:15 PM  Depression screen  PHQ 2/9  Decreased Interest  0 0 0 1  Down, Depressed, Hopeless 1 1 1 1 1   PHQ - 2 Score 1 1 1 1 2   Altered sleeping     1  Tired, decreased energy     1  Change in appetite     0  Trouble concentrating     1  Moving slowly or fidgety/restless     0  Suicidal thoughts     0  PHQ-9 Score     5       Assessment & Plan:  Patient Active Problem List   Diagnosis Date Noted   Lower abdominal pain 05/28/2022   Post-nasal drip 05/28/2022   Plantar fasciitis, right 03/19/2022   Numbness and tingling in both hands 03/19/2022   Poor social situation 10/25/2021   Axillary abscess 09/20/2021   Therapeutic drug monitoring 08/24/2021   Birth control counseling 12/20/2020   Hypertension 09/21/2020   Grief 08/29/2020   Encounter for initial prescription of injectable contraceptive 07/29/2020   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   Screening for cervical cancer 04/21/2020   Screening for STDs (sexually transmitted diseases) 04/21/2020   Costochondritis 02/19/2020   Bilateral hip pain 02/19/2020   Elbow pain, right 12/04/2019   Housing problems 11/23/2019   Mental health disorder 10/12/2019   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   Paresthesias 10/12/2019   Healthcare maintenance 08/05/2019   HIV (human immunodeficiency virus infection) (HCC) 12/31/2018   Anxiety 05/29/2017   Self-mutilation 05/29/2017   Depression 06/07/2016   Marijuana use 06/07/2016   Low vitamin D level 03/15/2016   Prediabetes 02/22/2016   Asthma 02/22/2016   ADD (attention deficit disorder) 02/22/2016   Methamphetamine use disorder, severe, 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/22/2016   Hyperlipidemia with target LDL less than 70 01/31/2012   Migraine 01/31/2012   Tremor, 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     Problem List Items Addressed This Visit       Cardiovascular and Mediastinum   Hypertension    Blood pressure mildly elevated above goal 130/80.  No neurological or ophthalmologic signs/symptoms.   Continue current dose of hydrochlorothiazide.        Other   HIV (human immunodeficiency virus infection) (HCC) - Primary (Chronic)    Kristin Hart continues to have well-controlled virus with good adherence and tolerance to [employer].  Reviewed plan of care.  Plan for lab work in 2 to 3 months.  Continue current dose of Biktarvy.  Plan for follow-up in 1 month or sooner if needed.      Relevant Medications   mupirocin ointment (BACTROBAN) 2 %   Methamphetamine use disorder, severe, dependence (HCC)    Kristin Hart continues to use methamphetamine.  Discussed importance of seeking rehabilitation and sobriety to reduce risk of complications in the future.  She is in the precontemplation stage and not ready to quit at this time.      Healthcare maintenance    Discussed importance of safe sexual practice and condom use. Condoms and STD testing offered.  Depo-provera updated.  Due for routine dental care and will work with case manager to schedule appointment.  Due for mammogram and will refer at next office visit.       Birth control counseling    Due for depo-provera with injection provided with no adverse side effects. Discussed importance of safe sexual practice.         I am having Kristin Hart. Kristin Hart maintain her hydrocortisone cream, cyclobenzaprine, acetaminophen, topiramate, valACYclovir, diclofenac Sodium, albuterol, levocetirizine, bictegravir-emtricitabine-tenofovir AF, nystatin cream, omeprazole, hydrochlorothiazide, and mupirocin ointment. We administered medroxyPROGESTERone.   Meds ordered this encounter  Medications   mupirocin ointment (BACTROBAN) 2 %    Sig: Apply 1 Application topically 2 (two) times daily. To affected area till better    Dispense:  22 g    Refill:  0    Order Specific Question:   Supervising Provider    Answer:   Judyann Munson [[id number]]   medroxyPROGESTERone (DEPO-PROVERA) injection 150 mg     Follow-up: Return in about 1 month (around 09/02/2022).  Marcos Eke, MSN, FNP-C Nurse Practitioner [facility] Main number: [phone]

## 2022-08-02 NOTE — Assessment & Plan Note (Signed)
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  Discussed importance of seeking rehabilitation and sobriety to reduce risk of complications in the future.  She is in the precontemplation stage and not ready to quit at this time.

## 2022-08-02 NOTE — Patient Instructions (Addendum)
Nice to see you.  Continue to take your medication daily as prescribed.  Plan for follow up in 1 months or sooner if needed with lab work on the same day.  Have a great day and stay safe!  

## 2022-08-29 ENCOUNTER — Encounter: Payer: Self-pay | Admitting: Family

## 2022-08-29 ENCOUNTER — Other Ambulatory Visit: Payer: Self-pay

## 2022-08-29 ENCOUNTER — Ambulatory Visit (INDEPENDENT_AMBULATORY_CARE_PROVIDER_SITE_OTHER): Payer: Medicaid Other | Admitting: Family

## 2022-08-29 VITALS — BP 143/92 | HR 106 | Temp 98.1°F | Wt 159.0 lb

## 2022-08-29 DIAGNOSIS — Z0000 Encounter for general adult medical examination without abnormal findings: Secondary | ICD-10-CM

## 2022-08-29 DIAGNOSIS — Z609 Problem related to social environment, unspecified: Secondary | ICD-10-CM | POA: Diagnosis not present

## 2022-08-29 DIAGNOSIS — Z21 Asymptomatic human immunodeficiency virus [HIV] infection status: Secondary | ICD-10-CM

## 2022-08-29 DIAGNOSIS — F1520 Other stimulant dependence, uncomplicated: Secondary | ICD-10-CM

## 2022-08-29 DIAGNOSIS — F1590 Other stimulant use, unspecified, uncomplicated: Secondary | ICD-10-CM

## 2022-08-29 NOTE — Patient Instructions (Signed)
Nice to see you.  Continue to take your medication daily as prescribed.  Refills have been sent to the pharmacy.  Plan for follow up in 1 months or sooner if needed with lab work on the same day.  Have a great day and stay safe!  

## 2022-08-29 NOTE — Progress Notes (Unsigned)
Brief Narrative   Patient ID: Kristin Hart, female    DOB: 29-Sep-1980, 42 y.o.   MRN: [id number]  Ms. Kasprzak is a 42 y/o caucasian female diagnosed with HIV disease on 01/02/19 with risk factor of heterosexual contact and IV drug use. Initial viral load on 06/23/19 23,300 and CD4 count 577. Genotype with no significant drug resistance. Entered care at [facility] Stage 1. [id number] negative. No history of opportunistic infection. Sole medication regimen of Biktarvy   Subjective:    No chief complaint on file.   HPI:  Kristin Hart is a 42 y.o. female    No Known Allergies    Outpatient Medications Prior to Visit  Medication Sig Dispense Refill   acetaminophen (TYLENOL) 500 MG tablet Take 1 tablet (500 mg total) by mouth every 6 (six) hours as needed. 30 tablet 0   albuterol (VENTOLIN HFA) 108 (90 Base) MCG/ACT inhaler INHALE 1 PUFF INTO THE LUNGS EVERY 4 HOURS AS NEEDED. 18 each 2   bictegravir-emtricitabine-tenofovir AF (BIKTARVY) 50-200-25 MG TABS tablet Take 1 tablet by mouth daily. 30 tablet 5   cyclobenzaprine (FLEXERIL) 5 MG tablet Take 1 tablet (5 mg total) by mouth at bedtime. 30 tablet 4   diclofenac Sodium (VOLTAREN) 1 % GEL APPLY 2 GRAMS TOPICALLY TO THE AFFECTED AREA FOUR TIMES DAILY AS NEEDED FOR ELBOW OR PAIN OR HIP PAIN 200 g 0   hydrochlorothiazide (HYDRODIURIL) 12.5 MG tablet TAKE 1 TABLET(12.5 MG) BY MOUTH DAILY 90 tablet 1   hydrocortisone cream 1 % Apply to affected area 2 times daily 15 g 0   levocetirizine (XYZAL ALLERGY 24HR) 5 MG tablet Take 1 tablet (5 mg total) by mouth every evening. 30 tablet 5   mupirocin ointment (BACTROBAN) 2 % Apply 1 Application topically 2 (two) times daily. To affected area till better 22 g 0   nystatin cream (MYCOSTATIN) Apply to affected area 2 times daily 30 g 0   omeprazole (PRILOSEC) 40 MG capsule TAKE 1 CAPSULE(40 MG) BY MOUTH DAILY 90 capsule 1   topiramate (TOPAMAX) 50 MG tablet Take 1 tablet (50 mg total) by mouth daily. 30  tablet 2   valACYclovir (VALTREX) 500 MG tablet TAKE 1 TABLET(500 MG) BY MOUTH TWICE DAILY 60 tablet 2   No facility-administered medications prior to visit.     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, per pt   Diabetes mellitus without complication (HCC)    type 2   Foreign body in small intestine    HIV (human immunodeficiency virus infection) (HCC)    Hypothyroidism    Stomach ulcer      Past Surgical History:  Procedure Laterality Date   CESAREAN SECTION N/A 07/04/2016   Procedure: CESAREAN SECTION;  Surgeon: [name], DO;  Location: [facility] BIRTHING SUITES;  Service: Obstetrics;  Laterality: N/A;   ENTEROSCOPY N/A 11/08/2017   Procedure: ENTEROSCOPY;  Surgeon: Hilarie Fredrickson, MD;  Location: [facility] ENDOSCOPY;  Service: Endoscopy;  Laterality: N/A;   NO PAST SURGERIES        Review of Systems    Objective:    There were no vitals taken for this visit. Nursing note and vital signs reviewed.  Physical Exam      07/05/2022   10:51 AM 05/01/2022    3:46 PM 03/19/2022    3:19 PM 01/25/2022    3:18 PM 12/21/2021    2:15 PM  Depression screen PHQ 2/9  Decreased Interest  0 0 0 1  Down, Depressed, Hopeless 1 1 1 1 1   PHQ - 2 Score 1 1 1 1 2   Altered sleeping     1  Tired, decreased energy     1  Change in appetite     0  Trouble concentrating     1  Moving slowly or fidgety/restless     0  Suicidal thoughts     0  PHQ-9 Score     5       Assessment & Plan:    Patient Active Problem List   Diagnosis Date Noted   Lower abdominal pain 05/28/2022   Post-nasal drip 05/28/2022   Plantar fasciitis, right 03/19/2022   Numbness and tingling in both hands 03/19/2022   Poor social situation 10/25/2021   Axillary abscess 09/20/2021   Therapeutic drug monitoring 08/24/2021   Birth control counseling 12/20/2020   Hypertension 09/21/2020   Grief 08/29/2020   Encounter for initial  prescription of injectable contraceptive 07/29/2020   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   Screening for cervical cancer 04/21/2020   Screening for STDs (sexually transmitted diseases) 04/21/2020   Costochondritis 02/19/2020   Bilateral hip pain 02/19/2020   Elbow pain, right 12/04/2019   Housing problems 11/23/2019   Mental health disorder 10/12/2019   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   Paresthesias 10/12/2019   Healthcare maintenance 08/05/2019   HIV (human immunodeficiency virus infection) (HCC) 12/31/2018   Anxiety 05/29/2017   Self-mutilation 05/29/2017   Depression 06/07/2016   Marijuana use 06/07/2016   Low vitamin D level 03/15/2016   Prediabetes 02/22/2016   Asthma 02/22/2016   ADD (attention deficit disorder) 02/22/2016   Methamphetamine use disorder, severe, 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/22/2016   Hyperlipidemia with target LDL less than 70 01/31/2012   Migraine 01/31/2012   Tremor, 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     Problem List Items Addressed This Visit   None    I am having [name] maintain her hydrocortisone cream, cyclobenzaprine, acetaminophen, topiramate, valACYclovir, diclofenac Sodium, albuterol, levocetirizine, bictegravir-emtricitabine-tenofovir AF, nystatin cream, omeprazole, hydrochlorothiazide, and mupirocin ointment.   No orders of the defined types were placed in this encounter.    Follow-up: No follow-ups on file.   Marcos Eke, MSN, FNP-C Nurse Practitioner [facility] for Infectious Disease [facility] Main number: [phone]

## 2022-08-30 ENCOUNTER — Encounter: Payer: Self-pay | Admitting: Family

## 2022-08-30 NOTE — Assessment & Plan Note (Signed)
Discussed importance of safe sexual practice and condom use. Condoms and STD testing offered.  Depoprovera up to date Seen by dentist last week.

## 2022-08-30 NOTE — Assessment & Plan Note (Signed)
Obelia continues to have well controlled virus with good adherence and tolerance to [employer]. Reviewed previous lab work, U equals U and plan of care. Continue current dose of Biktarvy. Plan for follow up in 1 month or sooner if needed.

## 2022-08-30 NOTE — Assessment & Plan Note (Signed)
Ashely continues to use meth on a daily basis. In the pre-contemplation stage of change and not ready to quit. Encouraged cessation to reduce risk of complications in the future.

## 2022-08-30 NOTE — Assessment & Plan Note (Signed)
Housing remains stable at present time. Unable to take groceries today. Discussed resources and will be assigned to new counselor for case management.

## 2022-09-30 ENCOUNTER — Other Ambulatory Visit: Payer: Self-pay | Admitting: Family

## 2022-09-30 DIAGNOSIS — G43009 Migraine without aura, not intractable, without status migrainosus: Secondary | ICD-10-CM

## 2022-10-01 NOTE — Telephone Encounter (Signed)
Okay to refill? 

## 2022-10-03 ENCOUNTER — Telehealth: Payer: Self-pay

## 2022-10-03 ENCOUNTER — Encounter: Payer: Self-pay | Admitting: Family

## 2022-10-03 NOTE — Telephone Encounter (Signed)
Spoke with Kristin Hart, let her know that her symptoms aren't overly concerning especially considering the fact she has irregular periods. Asked her to update [place] if her symptoms worsen such as increased bleeding or painful cramping. Patient verbalized understanding and has no further questions.   Sandie Ano, RN

## 2022-10-03 NOTE — Telephone Encounter (Signed)
Kristin Hart called, reports she noticed some spotting last night. The blood was dark and she only experienced one episode of spotting. Reports she has always had irregular periods. She has frequent abdominal discomfort, but reports no unusual cramping last night. Last depo injection was 08/02/22.  Sandie Ano, RN

## 2022-10-15 ENCOUNTER — Ambulatory Visit (INDEPENDENT_AMBULATORY_CARE_PROVIDER_SITE_OTHER): Payer: Medicaid Other | Admitting: Family

## 2022-10-15 ENCOUNTER — Other Ambulatory Visit: Payer: Self-pay

## 2022-10-15 ENCOUNTER — Encounter: Payer: Self-pay | Admitting: Family

## 2022-10-15 VITALS — BP 118/81 | HR 105 | Temp 97.2°F | Ht 67.0 in | Wt 157.0 lb

## 2022-10-15 DIAGNOSIS — M25552 Pain in left hip: Secondary | ICD-10-CM | POA: Diagnosis not present

## 2022-10-15 DIAGNOSIS — M25551 Pain in right hip: Secondary | ICD-10-CM | POA: Diagnosis not present

## 2022-10-15 DIAGNOSIS — F1520 Other stimulant dependence, uncomplicated: Secondary | ICD-10-CM

## 2022-10-15 DIAGNOSIS — G43909 Migraine, unspecified, not intractable, without status migrainosus: Secondary | ICD-10-CM | POA: Diagnosis not present

## 2022-10-15 DIAGNOSIS — Z609 Problem related to social environment, unspecified: Secondary | ICD-10-CM

## 2022-10-15 DIAGNOSIS — B20 Human immunodeficiency virus [HIV] disease: Secondary | ICD-10-CM

## 2022-10-15 DIAGNOSIS — G43001 Migraine without aura, not intractable, with status migrainosus: Secondary | ICD-10-CM

## 2022-10-15 DIAGNOSIS — Z21 Asymptomatic human immunodeficiency virus [HIV] infection status: Secondary | ICD-10-CM

## 2022-10-15 MED ORDER — NAPROXEN 500 MG PO TABS: 500.0000 mg | ORAL_TABLET | Freq: Two times a day (BID) | ORAL | 2 refills | Status: DC

## 2022-10-15 MED ORDER — RIZATRIPTAN BENZOATE 10 MG PO TABS: 5.0000 mg | ORAL_TABLET | ORAL | 1 refills | Status: DC | PRN

## 2022-10-15 MED ORDER — DICLOFENAC SODIUM 1 % EX GEL
4.0000 g | Freq: Four times a day (QID) | CUTANEOUS | 0 refills | Status: DC | PRN
Start: 2022-10-15 — End: 2023-06-10

## 2022-10-15 MED ORDER — DOVATO 50-300 MG PO TABS: 1.0000 | ORAL_TABLET | Freq: Every day | ORAL | 5 refills | Status: DC

## 2022-10-15 NOTE — Progress Notes (Unsigned)
Brief Narrative   Patient ID: Kristin Hart, female    DOB: 1980-07-05, 42 y.o.   MRN: [id number]  Kristin Hart is a 42 y/o caucasian female diagnosed with HIV disease on 01/02/19 with risk factor of heterosexual contact and IV drug use. Initial viral load on 06/23/19 23,300 and CD4 count 577. Genotype with no significant drug resistance. Entered care at [facility] Stage 1. [id number] negative. No history of opportunistic infection. Sole medication regimen of Biktarvy   Subjective:    Chief Complaint  Patient presents with   Follow-up    B20    HPI:  Kristin Hart is a 43 y.o. female with HIV disease last seen on 08/29/22 wit well-controlled virus and good adherence and tolerance to [employer]. No recent lab work completed. Here today for follow up. Kristin Hart has been doing okay  Kristin Hart new case worker; Doing well with Biktarvy; Blood draw next month;  No Known Allergies    Outpatient Medications Prior to Visit  Medication Sig Dispense Refill   acetaminophen (TYLENOL) 500 MG tablet Take 1 tablet (500 mg total) by mouth every 6 (six) hours as needed. 30 tablet 0   albuterol (VENTOLIN HFA) 108 (90 Base) MCG/ACT inhaler INHALE 1 PUFF INTO THE LUNGS EVERY 4 HOURS AS NEEDED. 18 each 2   bictegravir-emtricitabine-tenofovir AF (BIKTARVY) 50-200-25 MG TABS tablet Take 1 tablet by mouth daily. 30 tablet 5   cyclobenzaprine (FLEXERIL) 5 MG tablet Take 1 tablet (5 mg total) by mouth at bedtime. 30 tablet 4   diclofenac Sodium (VOLTAREN) 1 % GEL APPLY 2 GRAMS TOPICALLY TO THE AFFECTED AREA FOUR TIMES DAILY AS NEEDED FOR ELBOW OR PAIN OR HIP PAIN 200 g 0   hydrochlorothiazide (HYDRODIURIL) 12.5 MG tablet TAKE 1 TABLET(12.5 MG) BY MOUTH DAILY 90 tablet 1   hydrocortisone cream 1 % Apply to affected area 2 times daily 15 g 0   levocetirizine (XYZAL ALLERGY 24HR) 5 MG tablet Take 1 tablet (5 mg total) by mouth every evening. 30 tablet 5   mupirocin ointment (BACTROBAN) 2 % Apply 1 Application topically  2 (two) times daily. To affected area till better 22 g 0   nystatin cream (MYCOSTATIN) Apply to affected area 2 times daily 30 g 0   omeprazole (PRILOSEC) 40 MG capsule TAKE 1 CAPSULE(40 MG) BY MOUTH DAILY 90 capsule 1   topiramate (TOPAMAX) 50 MG tablet Take 1 tablet (50 mg total) by mouth daily. 30 tablet 5   valACYclovir (VALTREX) 500 MG tablet TAKE 1 TABLET(500 MG) BY MOUTH TWICE DAILY 60 tablet 2   No facility-administered medications prior to visit.     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, per pt   Diabetes mellitus without complication (HCC)    type 2   Foreign body in small intestine    HIV (human immunodeficiency virus infection) (HCC)    Hypothyroidism    Stomach ulcer      Past Surgical History:  Procedure Laterality Date   CESAREAN SECTION N/A 07/04/2016   Procedure: CESAREAN SECTION;  Surgeon: [name], DO;  Location: [facility] BIRTHING SUITES;  Service: Obstetrics;  Laterality: N/A;   ENTEROSCOPY N/A 11/08/2017   Procedure: ENTEROSCOPY;  Surgeon: Hilarie Fredrickson, MD;  Location: [facility] ENDOSCOPY;  Service: Endoscopy;  Laterality: N/A;   NO PAST SURGERIES        Review of Systems    Objective:  BP 118/81   Pulse (!) 105   Temp (!) 97.2 F (36.2 C) (Temporal)   Ht 5\' 7"  (1.702 m)   Wt 157 lb (71.2 kg)   BMI 24.59 kg/m  Nursing note and vital signs reviewed.  Physical Exam      10/15/2022    3:18 PM 08/29/2022    4:22 PM 07/05/2022   10:51 AM 05/01/2022    3:46 PM 03/19/2022    3:19 PM  Depression screen PHQ 2/9  Decreased Interest 0 0  0 0  Down, Depressed, Hopeless 1 0 1 1 1   PHQ - 2 Score 1 0 1 1 1        Assessment & Plan:    Patient Active Problem List   Diagnosis Date Noted   Lower abdominal pain 05/28/2022   Post-nasal drip 05/28/2022   Plantar fasciitis, right 03/19/2022   Numbness and tingling in both hands 03/19/2022   Poor social situation 10/25/2021    Axillary abscess 09/20/2021   Therapeutic drug monitoring 08/24/2021   Birth control counseling 12/20/2020   Hypertension 09/21/2020   Grief 08/29/2020   Encounter for initial prescription of injectable contraceptive 07/29/2020   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   Screening for cervical cancer 04/21/2020   Screening for STDs (sexually transmitted diseases) 04/21/2020   Costochondritis 02/19/2020   Bilateral hip pain 02/19/2020   Elbow pain, right 12/04/2019   Housing problems 11/23/2019   Mental health disorder 10/12/2019   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   Paresthesias 10/12/2019   Healthcare maintenance 08/05/2019   HIV (human immunodeficiency virus infection) (HCC) 12/31/2018   Anxiety 05/29/2017   Self-mutilation 05/29/2017   Depression 06/07/2016   Marijuana use 06/07/2016   Low vitamin D level 03/15/2016   Prediabetes 02/22/2016   Asthma 02/22/2016   ADD (attention deficit disorder) 02/22/2016   Methamphetamine use disorder, severe, 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/22/2016   Hyperlipidemia with target LDL less than 70 01/31/2012   Migraine 01/31/2012   Tremor, 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     Problem List Items Addressed This Visit   None    I am having Kristin Hart maintain her hydrocortisone cream, cyclobenzaprine, acetaminophen, valACYclovir, diclofenac Sodium, albuterol, levocetirizine, bictegravir-emtricitabine-tenofovir AF, nystatin cream, omeprazole, hydrochlorothiazide, mupirocin ointment, and topiramate.   No orders of the defined types were placed in this encounter.    Follow-up: No follow-ups on file.   Marcos Eke, MSN, FNP-C Nurse Practitioner [facility] for Infectious Disease [facility] Main number: [phone]

## 2022-10-15 NOTE — Patient Instructions (Signed)
Nice to see you.  We will check lab work next month (September).   Continue to take your medication daily as prescribed.  Refills have been sent to the pharmacy.  Plan for follow up in 1 months or sooner if needed with lab work on the same day.  Have a great day and stay safe!

## 2022-10-16 ENCOUNTER — Encounter: Payer: Self-pay | Admitting: Family

## 2022-10-16 NOTE — Assessment & Plan Note (Signed)
Continues to live under the stairs at her mother in laws house near her son. Groceries provided.

## 2022-10-16 NOTE — Assessment & Plan Note (Signed)
Kristin Hart continues to have migraine headaches despite good adherence and tolerance to topiramate. Discussed that there is room to go up on this medication and the potential side effects associated with it. Will try Maxalt as needed for migraines. May need a new Neurology referral if symptoms worsen or frequency increases.

## 2022-10-16 NOTE — Assessment & Plan Note (Signed)
Elliahna continues to use methamphetamines and is contemplating quitting. Discussed resources and to use [facility] to help connect with other resources.

## 2022-10-16 NOTE — Assessment & Plan Note (Signed)
Continue current dose of diclofenac gel as needed and will add naproxen sodium for use as needed for bilateral hip pain. Instructed of potential side effects of gastric issues and renal issues with long term use.

## 2022-10-16 NOTE — Assessment & Plan Note (Signed)
Doritha continues to have well controlled virus with good adherence and tolerance to [employer]. Reviewed previous lab work and will be due for next labs in 1 month. Will change Biktarvy to Dovato given increased need for NSAIDS and the potential additive effect with tenofovir contained within [place]. Currently on birth control. Plan for follow up in 1 month or sooner if needed with lab work on the same day.

## 2022-11-01 ENCOUNTER — Telehealth: Payer: Self-pay

## 2022-11-01 NOTE — Telephone Encounter (Signed)
Patient  called office today regarding long term GI issues. States that she has been struggling with loose stool, gas, and stomach cramps. Is taking Gas X and imodium.  Would like to know if there is anything else provider can recommend to help with symptoms. Juanita Laster, RMA

## 2022-11-06 NOTE — Telephone Encounter (Signed)
Spoke with patient regarding last week call. States that stool not having issues with loose stool, but is having constipation off and on. Denies any use of illicit drugs.  Kristin Hart, RMA

## 2022-11-06 NOTE — Telephone Encounter (Signed)
She may need to schedule a visit to discuss, but it sounds like she may have some irritable bowel syndrome. Any constipation mixed in or is it usually loose stools? She had stopped using meth from last office visit, is that still the case?

## 2022-11-07 NOTE — Telephone Encounter (Signed)
Spoke with patient and relayed providers recommendations. Advised she monitor symptoms and discuss at upcoming appt. Verbalized understanding. Juanita Laster, RMA

## 2022-11-15 ENCOUNTER — Other Ambulatory Visit: Payer: Self-pay

## 2022-11-15 ENCOUNTER — Encounter: Payer: Self-pay | Admitting: Family

## 2022-11-15 ENCOUNTER — Other Ambulatory Visit (HOSPITAL_COMMUNITY): Payer: Self-pay

## 2022-11-15 ENCOUNTER — Ambulatory Visit (INDEPENDENT_AMBULATORY_CARE_PROVIDER_SITE_OTHER): Payer: Medicaid Other | Admitting: Family

## 2022-11-15 VITALS — BP 145/87 | HR 109 | Resp 16 | Ht 67.0 in | Wt 160.3 lb

## 2022-11-15 DIAGNOSIS — Z3009 Encounter for other general counseling and advice on contraception: Secondary | ICD-10-CM | POA: Diagnosis not present

## 2022-11-15 DIAGNOSIS — Z3042 Encounter for surveillance of injectable contraceptive: Secondary | ICD-10-CM

## 2022-11-15 DIAGNOSIS — Z0000 Encounter for general adult medical examination without abnormal findings: Secondary | ICD-10-CM

## 2022-11-15 DIAGNOSIS — R1030 Lower abdominal pain, unspecified: Secondary | ICD-10-CM | POA: Diagnosis not present

## 2022-11-15 DIAGNOSIS — Z113 Encounter for screening for infections with a predominantly sexual mode of transmission: Secondary | ICD-10-CM

## 2022-11-15 DIAGNOSIS — F1520 Other stimulant dependence, uncomplicated: Secondary | ICD-10-CM

## 2022-11-15 DIAGNOSIS — I158 Other secondary hypertension: Secondary | ICD-10-CM | POA: Diagnosis not present

## 2022-11-15 DIAGNOSIS — B20 Human immunodeficiency virus [HIV] disease: Secondary | ICD-10-CM | POA: Diagnosis not present

## 2022-11-15 DIAGNOSIS — Z23 Encounter for immunization: Secondary | ICD-10-CM

## 2022-11-15 LAB — POCT URINE PREGNANCY: Preg Test, Ur: NEGATIVE

## 2022-11-15 MED ORDER — LINACLOTIDE 145 MCG PO CAPS: 145.0000 ug | ORAL_CAPSULE | Freq: Every day | ORAL | 1 refills | Status: DC

## 2022-11-15 MED ORDER — DOVATO 50-300 MG PO TABS: 1.0000 | ORAL_TABLET | Freq: Every day | ORAL | 5 refills | Status: DC

## 2022-11-15 MED ORDER — MEDROXYPROGESTERONE ACETATE 150 MG/ML IM SUSP
150.0000 mg | Freq: Once | INTRAMUSCULAR | Status: AC
Start: 2022-11-15 — End: 2022-11-15
  Administered 2022-11-15: 150 mg via INTRAMUSCULAR

## 2022-11-15 NOTE — Patient Instructions (Signed)
Nice to see you.  We will check your lab work today.  Continue to take your medication daily as prescribed.  Refills have been sent to the pharmacy.  Plan for follow up in 1  months or sooner if needed.   Have a great day and stay safe!

## 2022-11-15 NOTE — Assessment & Plan Note (Signed)
Kristin Hart continues to receive depo-provera with no adverse side effects. Currently at 15 weeks and POCT pregnancy test negative. Injection provided. Plan for next injection in 3 months.

## 2022-11-15 NOTE — Assessment & Plan Note (Signed)
Discussed importance of safe sexual practice and condom use. Condoms and STD testing offered.  Influenza vaccination updated.  Scheduled for dental surgery in October.

## 2022-11-15 NOTE — Assessment & Plan Note (Signed)
Portia continues to have abdominal pain and cramping and constipation. No specific triggers or patterns. Sounds consistent with IBS with constipation and will try Linzess. Advised to stop medication if diarrhea occurs.  If symptoms do not improve can consider hycosamine.

## 2022-11-15 NOTE — Assessment & Plan Note (Signed)
Blood pressure mildly elevated likely related to pending blood draw. Has good adherence and tolerance to hydrochlorothiazide. No neurological/ophthalmologic signs/symptoms. No leg swelling. Continue current dose of hydrochlorothiazide.

## 2022-11-15 NOTE — Assessment & Plan Note (Addendum)
Sharnel continues to have well controlled virus with good adherence and tolerance to Dovato. Reviewed lab work and discussed plan of care. Check lab work. Continue current dose of Dovato. Plan for follow up in 1 month or sooner if needed.

## 2022-11-15 NOTE — Progress Notes (Signed)
Brief Narrative   Patient ID: Kristin Hart, female    DOB: 04/18/1980, 42 y.o.   MRN: [id number]  Kristin Hart is a 43 y/o caucasian female diagnosed with HIV disease on 01/02/19 with risk factor of heterosexual contact and IV drug use. Initial viral load on 06/23/19 23,300 and CD4 count 577. Genotype with no significant drug resistance. Entered care at [facility] Stage 1. [id number] negative. No history of opportunistic infection. Sole medication regimen of Biktarvy   Subjective:    Chief Complaint  Patient presents with   Follow-up    HPI:  Kristin Hart is a 42 y.o. female with HIV disease last seen on 10/15/22 with well controlled virus and good adherence and tolerance to Dovato. Viral load undetectable. Here today for routine follow up.  Kristin Hart has been doing okay since her last office visit and has been having abdominal cramping and constipation on occasion. Cramping occasionally resolved with bowel movements. Symptoms wax and wane. No triggers or particular foods. Working on decreasing methamphetamine use. Taking medications as prescribed with no adverse side effects or problems obtaining medication. Due for depo-provera and influenza vaccination. Has dental surgery scheduled for October.  Denies fevers, chills, night sweats, headaches, changes in vision, neck pain/stiffness, nausea, diarrhea, vomiting, lesions or rashes.   No Known Allergies    Outpatient Medications Prior to Visit  Medication Sig Dispense Refill   acetaminophen (TYLENOL) 500 MG tablet Take 1 tablet (500 mg total) by mouth every 6 (six) hours as needed. 30 tablet 0   albuterol (VENTOLIN HFA) 108 (90 Base) MCG/ACT inhaler INHALE 1 PUFF INTO THE LUNGS EVERY 4 HOURS AS NEEDED. 18 each 2   cyclobenzaprine (FLEXERIL) 5 MG tablet Take 1 tablet (5 mg total) by mouth at bedtime. 30 tablet 4   diclofenac Sodium (VOLTAREN) 1 % GEL Apply 4 g topically 4 (four) times daily as needed. 350 g 0   hydrochlorothiazide  (HYDRODIURIL) 12.5 MG tablet TAKE 1 TABLET(12.5 MG) BY MOUTH DAILY 90 tablet 1   hydrocortisone cream 1 % Apply to affected area 2 times daily 15 g 0   levocetirizine (XYZAL ALLERGY 24HR) 5 MG tablet Take 1 tablet (5 mg total) by mouth every evening. 30 tablet 5   mupirocin ointment (BACTROBAN) 2 % Apply 1 Application topically 2 (two) times daily. To affected area till better 22 g 0   naproxen (NAPROSYN) 500 MG tablet Take 1 tablet (500 mg total) by mouth 2 (two) times daily with a meal. 60 tablet 2   nystatin cream (MYCOSTATIN) Apply to affected area 2 times daily 30 g 0   omeprazole (PRILOSEC) 40 MG capsule TAKE 1 CAPSULE(40 MG) BY MOUTH DAILY 90 capsule 1   rizatriptan (MAXALT) 10 MG tablet Take 0.5-1 tablets (5-10 mg total) by mouth as needed for migraine. May repeat in 2 hours if needed max dose 20 mg/day 10 tablet 1   topiramate (TOPAMAX) 50 MG tablet Take 1 tablet (50 mg total) by mouth daily. 30 tablet 5   valACYclovir (VALTREX) 500 MG tablet TAKE 1 TABLET(500 MG) BY MOUTH TWICE DAILY 60 tablet 2   dolutegravir-lamiVUDine (DOVATO) 50-300 MG tablet Take 1 tablet by mouth daily. 30 tablet 5   No facility-administered medications prior to visit.     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, per pt   Diabetes mellitus without complication (HCC)    type 2  Foreign body in small intestine    HIV (human immunodeficiency virus infection) (HCC)    Hypothyroidism    Stomach ulcer      Past Surgical History:  Procedure Laterality Date   CESAREAN SECTION N/A 07/04/2016   Procedure: CESAREAN SECTION;  Surgeon: [name], DO;  Location: [facility] BIRTHING SUITES;  Service: Obstetrics;  Laterality: N/A;   ENTEROSCOPY N/A 11/08/2017   Procedure: ENTEROSCOPY;  Surgeon: Hilarie Fredrickson, MD;  Location: [facility] ENDOSCOPY;  Service: Endoscopy;  Laterality: N/A;   NO PAST SURGERIES        Review of Systems   Constitutional:  Negative for appetite change, chills, diaphoresis, fatigue, fever and unexpected weight change.  Eyes:        Negative for acute change in vision  Respiratory:  Negative for chest tightness, shortness of breath and wheezing.   Cardiovascular:  Negative for chest pain.  Gastrointestinal:  Positive for constipation. Negative for diarrhea, nausea and vomiting.  Genitourinary:  Negative for dysuria, pelvic pain and vaginal discharge.  Musculoskeletal:  Negative for neck pain and neck stiffness.  Skin:  Negative for rash.  Neurological:  Negative for seizures, syncope, weakness and headaches.  Hematological:  Negative for adenopathy. Does not bruise/bleed easily.  Psychiatric/Behavioral:  Negative for hallucinations.       Objective:    BP (!) 145/87   Pulse (!) 109   Resp 16   Ht 5\' 7"  (1.702 m)   Wt 160 lb 4.8 oz (72.7 kg)   LMP  (LMP Unknown)   SpO2 98%   BMI 25.11 kg/m  Nursing note and vital signs reviewed.  Physical Exam Constitutional:      General: She is not in acute distress.    Appearance: She is well-developed.  Eyes:     Conjunctiva/sclera: Conjunctivae normal.  Cardiovascular:     Rate and Rhythm: Normal rate and regular rhythm.     Heart sounds: Normal heart sounds. No murmur heard.    No friction rub. No gallop.  Pulmonary:     Effort: Pulmonary effort is normal. No respiratory distress.     Breath sounds: Normal breath sounds. No wheezing or rales.  Chest:     Chest wall: No tenderness.  Abdominal:     General: Bowel sounds are normal.     Palpations: Abdomen is soft.     Tenderness: There is no abdominal tenderness.  Musculoskeletal:     Cervical back: Neck supple.  Lymphadenopathy:     Cervical: No cervical adenopathy.  Skin:    General: Skin is warm and dry.     Findings: No rash.  Neurological:     Mental Status: She is alert and oriented to person, place, and time.         11/15/2022    2:36 PM 10/15/2022    3:18 PM  08/29/2022    4:22 PM 07/05/2022   10:51 AM 05/01/2022    3:46 PM  Depression screen PHQ 2/9  Decreased Interest 0 0 0  0  Down, Depressed, Hopeless 0 1 0 1 1  PHQ - 2 Score 0 1 0 1 1       Assessment & Plan:    Patient Active Problem List   Diagnosis Date Noted   Lower abdominal pain 05/28/2022   Post-nasal drip 05/28/2022   Plantar fasciitis, right 03/19/2022   Numbness and tingling in both hands 03/19/2022   Poor social situation 10/25/2021   Axillary abscess 09/20/2021   Therapeutic drug monitoring  08/24/2021   Birth control counseling 12/20/2020   Hypertension 09/21/2020   Grief 08/29/2020   Encounter for surveillance of injectable contraceptive 07/29/2020   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   Screening for cervical cancer 04/21/2020   Screening for STDs (sexually transmitted diseases) 04/21/2020   Costochondritis 02/19/2020   Bilateral hip pain 02/19/2020   Elbow pain, right 12/04/2019   Housing problems 11/23/2019   Mental health disorder 10/12/2019   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   Paresthesias 10/12/2019   Healthcare maintenance 08/05/2019   HIV disease (HCC) 12/31/2018   Anxiety 05/29/2017   Self-mutilation 05/29/2017   Depression 06/07/2016   Marijuana use 06/07/2016   Low vitamin D level 03/15/2016   Prediabetes 02/22/2016   Asthma 02/22/2016   ADD (attention deficit disorder) 02/22/2016   Methamphetamine use disorder, severe, 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/22/2016   Hyperlipidemia with target LDL less than 70 01/31/2012   Migraine 01/31/2012   Tremor, 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     Problem List Items Addressed This Visit       Cardiovascular and Mediastinum   Hypertension - Primary    Blood pressure mildly elevated likely related to pending blood draw. Has good adherence and tolerance to hydrochlorothiazide. No neurological/ophthalmologic signs/symptoms. No leg swelling. Continue current dose of hydrochlorothiazide.         Other    Methamphetamine use disorder, severe, dependence (HCC)    Milove Hart to use methamphetamine and per report she is cutting back on usage. She is aware of resources. Counseled on cessation.       HIV disease (HCC)    Kristin Hart Hart to have well controlled virus with good adherence and tolerance to Dovato. Reviewed lab work and discussed plan of care. Check lab work. Continue current dose of Dovato. Plan for follow up in 1 month or sooner if needed.       Relevant Medications   dolutegravir-lamiVUDine (DOVATO) 50-300 MG tablet   Other Relevant Orders   COMPLETE METABOLIC PANEL WITH GFR   HIV-1 RNA quant-no reflex-bld   T-helper cell (CD4)- ([facility] clinic only)   Healthcare maintenance    Discussed importance of safe sexual practice and condom use. Condoms and STD testing offered.  Influenza vaccination updated.  Scheduled for dental surgery in October.       Screening for STDs (sexually transmitted diseases)   Relevant Orders   RPR   Encounter for surveillance of injectable contraceptive    Kristin Hart Hart to receive depo-provera with no adverse side effects. Currently at 15 weeks and POCT pregnancy test negative. Injection provided. Plan for next injection in 3 months.       Birth control counseling   Relevant Orders   POCT urine pregnancy (Completed)   Lower abdominal pain    Kristin Hart Hart to have abdominal pain and cramping and constipation. No specific triggers or patterns. Sounds consistent with IBS with constipation and will try Linzess. Advised to stop medication if diarrhea occurs.  If symptoms do not improve can consider hycosamine.       Other Visit Diagnoses     Encounter for immunization       Relevant Orders   Flu vaccine trivalent PF, 6mos and older(Flulaval,Afluria,Fluarix,Fluzone) (Completed)        I am having Kristin Hart. Wedge start on linaclotide. I am also having her maintain her hydrocortisone cream, cyclobenzaprine, acetaminophen, valACYclovir,  albuterol, levocetirizine, nystatin cream, omeprazole, hydrochlorothiazide, mupirocin ointment, topiramate, rizatriptan, diclofenac Sodium, naproxen, and Dovato. We administered medroxyPROGESTERone.  Meds ordered this encounter  Medications   medroxyPROGESTERone (DEPO-PROVERA) injection 150 mg   dolutegravir-lamiVUDine (DOVATO) 50-300 MG tablet    Sig: Take 1 tablet by mouth daily.    Dispense:  30 tablet    Refill:  5    Order Specific Question:   Supervising Provider    Answer:   Drue Second, CYNTHIA [[id number]]   linaclotide (LINZESS) 145 MCG CAPS capsule    Sig: Take 1 capsule (145 mcg total) by mouth daily before breakfast.    Dispense:  90 capsule    Refill:  1    Order Specific Question:   Supervising Provider    Answer:   Judyann Munson [[id number]]     Follow-up: Return in about 1 month (around 12/15/2022), or if symptoms worsen or fail to improve.   Marcos Eke, MSN, FNP-C Nurse Practitioner [facility] for Infectious Disease [facility] Main number: [phone]

## 2022-11-15 NOTE — Assessment & Plan Note (Signed)
Kristin Hart continues to use methamphetamine and per report she is cutting back on usage. She is aware of resources. Counseled on cessation.

## 2022-11-16 LAB — T-HELPER CELL (CD4) - (RCID CLINIC ONLY)
CD4 % Helper T Cell: 61 % (ref 33–65)
CD4 T Cell Abs: 973 /uL (ref 400–1790)

## 2022-11-17 LAB — COMPLETE METABOLIC PANEL WITH GFR
AG Ratio: 1.4 (calc) (ref 1.0–2.5)
ALT: 16 U/L (ref 6–29)
AST: 16 U/L (ref 10–30)
Albumin: 3.9 g/dL (ref 3.6–5.1)
Alkaline phosphatase (APISO): 97 U/L (ref 31–125)
BUN: 13 mg/dL (ref 7–25)
CO2: 23 mmol/L (ref 20–32)
Calcium: 9.5 mg/dL (ref 8.6–10.2)
Chloride: 100 mmol/L (ref 98–110)
Creat: 0.84 mg/dL (ref 0.50–0.99)
Globulin: 2.7 g/dL (ref 1.9–3.7)
Glucose, Bld: 486 mg/dL — ABNORMAL HIGH (ref 65–99)
Potassium: 3.5 mmol/L (ref 3.5–5.3)
Sodium: 133 mmol/L — ABNORMAL LOW (ref 135–146)
Total Bilirubin: 0.3 mg/dL (ref 0.2–1.2)
Total Protein: 6.6 g/dL (ref 6.1–8.1)
eGFR: 90 mL/min/{1.73_m2} (ref >=60)

## 2022-11-17 LAB — HIV-1 RNA QUANT-NO REFLEX-BLD
HIV 1 RNA Quant: NOT DETECTED Copies/mL
HIV-1 RNA Quant, Log: NOT DETECTED {Log_copies}/mL

## 2022-11-17 LAB — RPR: RPR Ser Ql: NONREACTIVE

## 2022-12-07 ENCOUNTER — Other Ambulatory Visit: Payer: Self-pay | Admitting: Family

## 2022-12-11 ENCOUNTER — Other Ambulatory Visit: Payer: Self-pay

## 2022-12-11 ENCOUNTER — Encounter (HOSPITAL_COMMUNITY): Payer: Self-pay | Admitting: Vascular Surgery

## 2022-12-11 ENCOUNTER — Encounter (HOSPITAL_COMMUNITY): Payer: Self-pay | Admitting: Oral Surgery

## 2022-12-11 ENCOUNTER — Ambulatory Visit: Payer: Medicaid Other | Admitting: Family

## 2022-12-11 VITALS — BP 140/95 | HR 104 | Temp 98.3°F | Resp 16 | Wt 161.8 lb

## 2022-12-11 DIAGNOSIS — I158 Other secondary hypertension: Secondary | ICD-10-CM

## 2022-12-11 DIAGNOSIS — B20 Human immunodeficiency virus [HIV] disease: Secondary | ICD-10-CM | POA: Diagnosis not present

## 2022-12-11 DIAGNOSIS — E1165 Type 2 diabetes mellitus with hyperglycemia: Secondary | ICD-10-CM | POA: Diagnosis not present

## 2022-12-11 DIAGNOSIS — R197 Diarrhea, unspecified: Secondary | ICD-10-CM

## 2022-12-11 DIAGNOSIS — I10 Essential (primary) hypertension: Secondary | ICD-10-CM

## 2022-12-11 DIAGNOSIS — F1520 Other stimulant dependence, uncomplicated: Secondary | ICD-10-CM

## 2022-12-11 DIAGNOSIS — R7309 Other abnormal glucose: Secondary | ICD-10-CM

## 2022-12-11 LAB — GLUCOSE, POCT (MANUAL RESULT ENTRY): POC Glucose: 349 mg/dL — AB (ref 70–99)

## 2022-12-11 MED ORDER — BIKTARVY 50-200-25 MG PO TABS: 1.0000 | ORAL_TABLET | Freq: Every day | ORAL | 5 refills | Status: DC

## 2022-12-11 NOTE — Progress Notes (Signed)
Anesthesia Chart Review: SAME DAY WORK-UP  Case: [id number] Date/Time: 12/13/22 1500   Procedure: DENTAL RESTORATION/EXTRACTIONS   Anesthesia type: General   Pre-op diagnosis: DENTAL CARRIES   [facility] RO[facility] 06 / [facility] OR   Surgeons: Ocie Doyne, DMD       DISCUSSION: Patient is a 42 year old female scheduled for the above procedure. Per H&P, she is for teeth extractions# 2, 4, 5, 6, 7, 8, 9, 10, 11, 12, 13, 18, 19, 20, 21, 28, 29, 31.   History includes never smoker, asthma, DM2, hypothyroidism, borderline personality disorder, HIV, substance abuse.  She is followed by [facility]. Last visit 11/15/22 with Jeanine Luz, FNP. Her HIV disease was well controlled with good adherence and tolerance to Dovato. She did admit to continued use of methamphetamines but was trying to cut back.  1 month follow-up planned.  Diabetes listed in history, but she indicated more of a pre-diabetes diagnosis in the past. She has never been on DM medications and does not check home CBGs. Labs at [facility] on 11/15/22 showed a glucose of 468 (just after eating). She actually has ID follow-up scheduled for 12/11/22. I called and spoke with Jeanine Luz, FNP due to concern of progression to diabetes. Per my discussion with anesthesiologist Eilene Ghazi, MD, we may need to consider postponing surgery if she has untreated, uncontrolled DM2. Even though she is followed there for HIV, she reportedly prefers care there (last visit with [facility] was in 2022). He will plan to check CBG at ID visit with further recommendations based on results. Will leave chart for follow-up.   She reported last meth use was a week ago. She relies on public transportation and does not have anyone to stay with her post-operative. RN notified Dr. Barbette Merino.      VS:  Wt Readings from Last 3 Encounters:  11/15/22 72.7 kg  10/15/22 71.2 kg  08/29/22 72.1 kg   BP Readings from Last 3 Encounters:  11/15/22 (!) 145/87   10/15/22 118/81  08/29/22 (!) 143/92   Pulse Readings from Last 3 Encounters:  11/15/22 (!) 109  10/15/22 (!) 105  08/29/22 (!) 106     PROVIDERS: Evette Georges, MD is PCP  Jacquelyne Balint, MD is neurologist Jeanine Luz, FNP is ID provider   LABS: Last lab results in [facility] include: Lab Results  Component Value Date   WBC 8.7 09/29/2021   HGB 10.7 (L) 09/29/2021   HCT 35.9 (L) 09/29/2021   PLT 277 09/29/2021   GLUCOSE 486 (H) 11/15/2022   ALT 16 11/15/2022   AST 16 11/15/2022   NA 133 (L) 11/15/2022   K 3.5 11/15/2022   CL 100 11/15/2022   CREATININE 0.84 11/15/2022   BUN 13 11/15/2022   CO2 23 11/15/2022    EKG: No EKG available for review.   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, per pt   Diabetes mellitus without complication (HCC)    type 2   Foreign body in small intestine    HIV (human immunodeficiency virus infection) (HCC)    Hypothyroidism    Stomach ulcer    Substance abuse (HCC)    Meth, IV drug use    Past Surgical History:  Procedure Laterality Date   CESAREAN SECTION N/A 07/04/2016   Procedure: CESAREAN SECTION;  Surgeon: [name], DO;  Location: [facility]IRTHING SUIT[facility];  Service: Obstetrics;  Laterality: N/A;   ENTEROSCOPY N/A 11/08/2017   Procedure: ENTEROSCOPY;  Surgeon: Hilarie Fredrickson, MD;  Location: [facility] ENDOSCOPY;  Service: Endoscopy;  Laterality: N/A;   NO PAST SURGERIES      MEDICATIONS: No current facility-administered medications for this encounter.    acetaminophen (TYLENOL) 500 MG tablet   albuterol (VENTOLIN HFA) 108 (90 Base) MCG/ACT inhaler   cetirizine (ZYRTEC) 10 MG tablet   cyclobenzaprine (FLEXERIL) 5 MG tablet   diclofenac Sodium (VOLTAREN) 1 % GEL   dolutegravir-lamiVUDine (DOVATO) 50-300 MG tablet   hydrochlorothiazide (HYDRODIURIL) 12.5 MG tablet   hydrocortisone cream 1 %   linaclotide (LINZESS) 145 MCG CAPS capsule    naproxen (NAPROSYN) 500 MG tablet   nystatin cream (MYCOSTATIN)   omeprazole (PRILOSEC) 40 MG capsule   rizatriptan (MAXALT) 10 MG tablet   topiramate (TOPAMAX) 50 MG tablet   mupirocin ointment (BACTROBAN) 2 %   valACYclovir (VALTREX) 500 MG tablet    Shonna Chock, PA-C Surgical Short Stay/Anesthesiology [facility] Phone [phone] [facility] Phone [phone] 12/11/2022 5:40 PM

## 2022-12-11 NOTE — Patient Instructions (Addendum)
Nice to see you.  Stop taking Linzess and use immodium as needed.   Change back to Biktarvy.   Continue to take your medication daily as prescribed.  Plan for follow up in 1 months or sooner if needed with lab work on the same day.  Have a great day and stay safe!

## 2022-12-11 NOTE — H&P (Signed)
  Patient: Kristin Hart  PID: [id number]  DOB: 21-Dec-1980  SEX: Female   Patient referred by  DDS for extraction teeth# 2, 4, 5, 6, 7, 8, 9, 10, 11, 12, 13, 18, 19, 20, 21, 28, 29, 31.  CC: Painful teeth.  Past Medical History:  High Blood Pressure, Asthma, Bruise Easily, Hypothyroid, Arthritis, Acid Reflux, Sexually Transmitted Diseases, Mental Health problems, COPD, HIV, Diabetes    Medications: Biktarvy, Omeprazole, Cetirizine, topiramate, Valcyclovir    Allergies:     None    Surgeries:   Oral Surgery, C Section     Social History       Smoking:  n           Alcohol:n Drug use: Meth                            Exam: BMI 25.  Gross decay  teeth# 2, 4, 5, 6, 7, 8, 9, 10, 11, 12, 13, 18, 19, 20, 21, 28, 29, 31. No purulence, edema, fluctuance, trismus. Oral cancer screening negative. Pharynx clear. No lymphadenopathy.  Panorex:Gross decay  teeth# 2, 4, 5, 6, 7, 8, 9, 10, 11, 12, 13, 18, 19, 20, 21, 28, 29, 31.  Assessment: ASA 3. Non-restorable teeth# 2, 4, 5, 6, 7, 8, 9, 10, 11, 12, 13, 18, 19, 20, 21, 28, 29, 31.              Plan: Extraction Teeth # 2, 4, 5, 6, 7, 8, 9, 10, 11, 12, 13, 18, 19, 20, 21, 28, 29, 31.   Alveo as indicated.  Hospital Day surgery.                 Rx: n               Risks and complications explained. Questions answered.   Georgia Lopes, DMD

## 2022-12-11 NOTE — Progress Notes (Unsigned)
Brief Narrative   Patient ID: Kristin Hart, female    DOB: [birth year], 42 y.o.   MRN: [id number]  Kristin Hart is a 42 y/o caucasian female diagnosed with HIV disease on 01/02/19 with risk factor of heterosexual contact and IV drug use. Initial viral load on 06/23/19 23,300 and CD4 count 577. Genotype with no significant drug resistance. Entered care at [facility] Stage 1. [id number] negative. No history of opportunistic infection. Sole medication regimen of Biktarvy   Subjective:    Chief Complaint  Patient presents with   Follow-up    B20     HPI:  Kristin Hart is a 42 y.o. female with HIV disease last seen on     Denies fevers, chills, night sweats, headaches, changes in vision, neck pain/stiffness, nausea, diarrhea, vomiting, lesions or rashes.  Lab Results  Component Value Date   CD4TCELL 61 11/15/2022   CD4TABS 973 11/15/2022   Lab Results  Component Value Date   HIV1RNAQUANT Not Detected 11/15/2022     No Known Allergies    Outpatient Medications Prior to Visit  Medication Sig Dispense Refill   acetaminophen (TYLENOL) 500 MG tablet Take 1 tablet (500 mg total) by mouth every 6 (six) hours as needed. 30 tablet 0   albuterol (VENTOLIN HFA) 108 (90 Base) MCG/ACT inhaler INHALE 1 PUFF INTO THE LUNGS EVERY 4 HOURS AS NEEDED. 18 each 2   cetirizine (ZYRTEC) 10 MG tablet Take 10 mg by mouth daily.     cyclobenzaprine (FLEXERIL) 5 MG tablet Take 1 tablet (5 mg total) by mouth at bedtime. (Patient taking differently: Take 5 mg by mouth at bedtime as needed for muscle spasms.) 30 tablet 4   diclofenac Sodium (VOLTAREN) 1 % GEL Apply 4 g topically 4 (four) times daily as needed. 350 g 0   dolutegravir-lamiVUDine (DOVATO) 50-300 MG tablet Take 1 tablet by mouth daily. 30 tablet 5   hydrochlorothiazide (HYDRODIURIL) 12.5 MG tablet TAKE 1 TABLET(12.5 MG) BY MOUTH DAILY 90 tablet 1   hydrocortisone cream 1 % Apply to affected area 2 times daily (Patient taking differently: Apply  1 Application topically 2 (two) times daily as needed for itching.) 15 g 0   linaclotide (LINZESS) 145 MCG CAPS capsule Take 1 capsule (145 mcg total) by mouth daily before breakfast. 90 capsule 1   mupirocin ointment (BACTROBAN) 2 % Apply 1 Application topically 2 (two) times daily. To affected area till better (Patient taking differently: Apply 1 Application topically 2 (two) times daily as needed (wound care). To affected area till better) 22 g 0   naproxen (NAPROSYN) 500 MG tablet Take 1 tablet (500 mg total) by mouth 2 (two) times daily with a meal. 60 tablet 2   nystatin cream (MYCOSTATIN) Apply to affected area 2 times daily (Patient taking differently: Apply 1 Application topically 2 (two) times daily as needed for dry skin.) 30 g 0   omeprazole (PRILOSEC) 40 MG capsule TAKE 1 CAPSULE(40 MG) BY MOUTH DAILY 90 capsule 1   rizatriptan (MAXALT) 10 MG tablet Take 0.5-1 tablets (5-10 mg total) by mouth as needed for migraine. May repeat in 2 hours if needed max dose 20 mg/day 10 tablet 1   topiramate (TOPAMAX) 50 MG tablet Take 1 tablet (50 mg total) by mouth daily. 30 tablet 5   valACYclovir (VALTREX) 500 MG tablet TAKE 1 TABLET(500 MG) BY MOUTH TWICE DAILY 60 tablet 2   No facility-administered medications prior to visit.     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, per pt   Depression    Diabetes mellitus without complication (HCC)    type 2   Foreign body in small intestine    GERD (gastroesophageal reflux disease)    HIV (human immunodeficiency virus infection) (HCC)    Hypothyroidism    Stomach ulcer    Substance abuse (HCC)    Meth, IV drug use     Past Surgical History:  Procedure Laterality Date   CESAREAN SECTION N/A 07/04/2016   Procedure: CESAREAN SECTION;  Surgeon: [name], DO;  Location: [facility] BIRTHING SUITES;   Service: Obstetrics;  Laterality: N/A;   ENTEROSCOPY N/A 11/08/2017   Procedure: ENTEROSCOPY;  Surgeon: Hilarie Fredrickson, MD;  Location: [facility] ENDOSCOPY;  Service: Endoscopy;  Laterality: N/A;   NO PAST SURGERIES        Review of Systems    Objective:    Wt 161 lb 12.8 oz (73.4 kg)   LMP  (LMP Unknown) Comment: last depo unknown  BMI 25.34 kg/m  Nursing note and vital signs reviewed.  Physical Exam      12/11/2022    3:39 PM 11/15/2022    2:36 PM 10/15/2022    3:18 PM 08/29/2022    4:22 PM 07/05/2022   10:51 AM  Depression screen PHQ 2/9  Decreased Interest 0 0 0 0   Down, Depressed, Hopeless 0 0 1 0 1  PHQ - 2 Score 0 0 1 0 1       Assessment & Plan:    Patient Active Problem List   Diagnosis Date Noted   Lower abdominal pain 05/28/2022   Post-nasal drip 05/28/2022   Plantar fasciitis, right 03/19/2022   Numbness and tingling in both hands 03/19/2022   Poor social situation 10/25/2021   Axillary abscess 09/20/2021   Therapeutic drug monitoring 08/24/2021   Birth control counseling 12/20/2020   Hypertension 09/21/2020   Grief 08/29/2020   Encounter for surveillance of injectable contraceptive 07/29/2020   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   Screening for cervical cancer 04/21/2020   Screening for STDs (sexually transmitted diseases) 04/21/2020   Costochondritis 02/19/2020   Bilateral hip pain 02/19/2020   Elbow pain, right 12/04/2019   Housing problems 11/23/2019   Mental health disorder 10/12/2019   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   Paresthesias 10/12/2019   Healthcare maintenance 08/05/2019   HIV disease (HCC) 12/31/2018   Anxiety 05/29/2017   Self-mutilation 05/29/2017   Depression 06/07/2016   Marijuana use 06/07/2016   Low vitamin D level 03/15/2016   Prediabetes 02/22/2016   Asthma 02/22/2016   ADD (attention deficit disorder) 02/22/2016   Methamphetamine use disorder, severe, 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/22/2016   Hyperlipidemia  with target LDL less than 70 01/31/2012   Migraine 01/31/2012   Tremor, 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     Problem List Items Addressed This Visit   None    I am having Kristin Hart maintain her hydrocortisone cream, cyclobenzaprine, acetaminophen, albuterol, nystatin cream, omeprazole, hydrochlorothiazide, mupirocin ointment, topiramate, rizatriptan, diclofenac Sodium, naproxen, Dovato, linaclotide, cetirizine, and valACYclovir.   No orders of the defined types were placed in this encounter.    Follow-up: No follow-ups on file. or sooner if needed.    Marcos Eke, MSN, FNP-C Nurse Practitioner [facility] for Infectious Disease [facility] Main number: [phone]

## 2022-12-11 NOTE — Progress Notes (Signed)
SDW call  Patient was given pre-op instructions over the phone. Patient verbalized understanding of instructions provided.  Patient states she does not have a ride home after surgery nor anyone to stay with her after surgery.  Dr. Ocie Doyne made aware.      Infectious Disease- Dr. Jeanine Luz, ID Cardiologist -  Pulmonary:    PPM/ICD - denies Device Orders - na Rep Notified - na   Chest x-ray - na EKG -  DOS 12/13/2022 Stress Test - ECHO -  Cardiac Cath -   Sleep Study/sleep apnea/CPAP: denies  Type II diabetic per record history.  Patient states she has never been diagnosed as diabetic, does not check her blood sugars and does not take any diabetic medications.  Last blood sugar was 486.  Fasting Blood sugar range: does not check sugars How often check sugars: does not check sugars   Blood Thinner Instructions: denies Aspirin Instructions:denies   ERAS Protcol - NPO   COVID TEST- na    Anesthesia review: Yes. DM, HIV +, asthma, meth use, IV drug use.    Patient denies shortness of breath, fever, cough and chest pain over the phone call  Your procedure is scheduled on Thursday December 13, 2022  Report to [facility] Main Entrance "A" at  1245  A.M., then check in with the Admitting office.  Call this number if you have problems the morning of surgery:  [phone]   If you have any questions prior to your surgery date call [phone]: Open Monday-Friday 8am-4pm If you experience any cold or flu symptoms such as cough, fever, chills, shortness of breath, etc. between now and your scheduled surgery, please notify us at the above number    Remember:  Do not eat or drink after midnight the night before your surgery  Take these medicines the morning of surgery with A SIP OF WATER:  Zyrtec, dovato, linzess, prilosec, topamax, valtrex  As needed: Tylenol, albuterol, maxalt  As of today, STOP taking any Aspirin (unless otherwise instructed by your surgeon) Aleve,  Naproxen, Ibuprofen, Motrin, Advil, Goody's, BC's, all herbal medications, fish oil, and all vitamins. This includes your voltaren.

## 2022-12-12 ENCOUNTER — Encounter: Payer: Self-pay | Admitting: Family

## 2022-12-12 DIAGNOSIS — R197 Diarrhea, unspecified: Secondary | ICD-10-CM | POA: Insufficient documentation

## 2022-12-12 LAB — HEMOGLOBIN A1C
Hgb A1c MFr Bld: 11.4 %{Hb} — ABNORMAL HIGH (ref <5.7)
Mean Plasma Glucose: 280 mg/dL
eAG (mmol/L): 15.5 mmol/L

## 2022-12-12 NOTE — Assessment & Plan Note (Signed)
Ashely's random blood sugar today was 349. Will check A1c with potential for diabetes despite having consumed a soda and crackers prior to arriving in the office. Will plan for treatment pending findings if indicated.   UPDATE 12/12/22: A1c noted to be 11.4 indicating Type 2 diabetes. Will start metformin and check on possible GLP-1 given elevation.

## 2022-12-12 NOTE — Assessment & Plan Note (Signed)
Blood pressure mildly elevated today. No neurological or ophthalmologic signs/symptoms. Continue current dose of hydrochlorothiazide. May need to increase if remains elevated.

## 2022-12-12 NOTE — Assessment & Plan Note (Signed)
Suspect diarrhea likely from combination of Dovato and Linzess. Will stop Linzess and have changed Dovato back to [employer]. May also be related to elevated blood sugars with A1c pending.

## 2022-12-12 NOTE — Assessment & Plan Note (Signed)
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 Discussed importance of cessation and resources available.

## 2022-12-12 NOTE — Assessment & Plan Note (Signed)
Lekeya continues to have well controlled virus with good adherence Dovato with increased abdominal pain. Changed secondary to NSAID use but now that has decreased and will change back to [place]. Reviewed lab work and discussed plan of care. Plan for follow up in 1 month or sooner if needed.

## 2022-12-13 ENCOUNTER — Ambulatory Visit (HOSPITAL_COMMUNITY): Admission: RE | Admit: 2022-12-13 | Payer: Medicaid Other | Source: Ambulatory Visit | Admitting: Oral Surgery

## 2022-12-13 HISTORY — DX: Gastro-esophageal reflux disease without esophagitis: K21.9

## 2022-12-13 HISTORY — DX: Attention-deficit hyperactivity disorder, unspecified type: F90.9

## 2022-12-13 HISTORY — DX: Other psychoactive substance abuse, uncomplicated: F19.10

## 2022-12-13 HISTORY — DX: Depression, unspecified: F32.A

## 2022-12-13 HISTORY — DX: Anxiety disorder, unspecified: F41.9

## 2022-12-13 SURGERY — DENTAL RESTORATION/EXTRACTIONS
Anesthesia: General

## 2023-01-14 ENCOUNTER — Ambulatory Visit: Payer: Medicaid Other | Admitting: Family

## 2023-01-14 ENCOUNTER — Telehealth: Payer: Self-pay

## 2023-01-14 NOTE — Telephone Encounter (Signed)
Called patient to reschedule missed appointment. No answer left voice message.

## 2023-01-22 ENCOUNTER — Telehealth: Payer: Self-pay | Admitting: Family Medicine

## 2023-01-22 NOTE — Telephone Encounter (Addendum)
Received surgical clearance form for alveoloplasty and and extraction of 18 teeth. Please schedule an appointment for pre-op evaluation, at which time patient will need repeat CBC, CMP. Consider PFTs for formal COPD testing, though will discuss with patient if she has ever been formally diagnosed/had symptoms. Will also consider starting on insulin therapy given A1c 11.4.  ADDENDUM 02/28/23: medical clearance form scanned into chart on 02/28/2023. Does not appear patient has scheduled pre-op appointment. Will route back to admin team to call and get her scheduled.

## 2023-01-24 NOTE — Telephone Encounter (Signed)
Called patient and left voicemail to call back and schedule an appointment.   Thanks [employer]

## 2023-01-30 ENCOUNTER — Ambulatory Visit: Payer: Medicaid Other

## 2023-01-31 ENCOUNTER — Emergency Department (HOSPITAL_COMMUNITY)
Admission: EM | Admit: 2023-01-31 | Discharge: 2023-01-31 | Disposition: A | Discharge status: Home / Self Care | Payer: Medicaid Other

## 2023-01-31 ENCOUNTER — Other Ambulatory Visit: Payer: Self-pay

## 2023-01-31 ENCOUNTER — Encounter (HOSPITAL_COMMUNITY): Payer: Self-pay

## 2023-01-31 ENCOUNTER — Ambulatory Visit (INDEPENDENT_AMBULATORY_CARE_PROVIDER_SITE_OTHER): Payer: Medicaid Other

## 2023-01-31 DIAGNOSIS — E119 Type 2 diabetes mellitus without complications: Secondary | ICD-10-CM | POA: Insufficient documentation

## 2023-01-31 DIAGNOSIS — I10 Essential (primary) hypertension: Secondary | ICD-10-CM | POA: Insufficient documentation

## 2023-01-31 DIAGNOSIS — Z3042 Encounter for surveillance of injectable contraceptive: Secondary | ICD-10-CM | POA: Diagnosis not present

## 2023-01-31 DIAGNOSIS — Z79899 Other long term (current) drug therapy: Secondary | ICD-10-CM | POA: Insufficient documentation

## 2023-01-31 DIAGNOSIS — R000 Tachycardia, unspecified: Secondary | ICD-10-CM | POA: Diagnosis not present

## 2023-01-31 DIAGNOSIS — K0889 Other specified disorders of teeth and supporting structures: Secondary | ICD-10-CM | POA: Diagnosis not present

## 2023-01-31 DIAGNOSIS — Z21 Asymptomatic human immunodeficiency virus [HIV] infection status: Secondary | ICD-10-CM | POA: Diagnosis not present

## 2023-01-31 MED ORDER — AMOXICILLIN-POT CLAVULANATE 875-125 MG PO TABS: 1.0000 | ORAL_TABLET | Freq: Two times a day (BID) | ORAL | 0 refills | Status: DC

## 2023-01-31 MED ORDER — MEDROXYPROGESTERONE ACETATE 150 MG/ML IM SUSP
150.0000 mg | Freq: Once | INTRAMUSCULAR | Status: AC
  Administered 2023-01-31: 150 mg via INTRAMUSCULAR

## 2023-01-31 MED ORDER — ACETAMINOPHEN 500 MG PO TABS
1000.0000 mg | ORAL_TABLET | Freq: Once | ORAL | Status: AC
  Administered 2023-01-31: 1000 mg via ORAL
  Filled 2023-01-31: qty 2

## 2023-01-31 NOTE — Discharge Instructions (Addendum)
You were seen here today for dental pain.  Please use antibiotics as prescribed.  Please follow-up with your PCP and dentist.  Please return to the emergency department for increasing swelling, redness, pain from around your tooth, chest pain, shortness of breath, severe vomiting or inability to keep down food, loss of consciousness, or any worsening symptom or concern.

## 2023-01-31 NOTE — ED Provider Notes (Signed)
[facility] DE[facility] AT [facility] Provider Note   CSN: [id number] Arrival date & time: 01/31/23  1552     History  Chief Complaint  Patient presents with   Dental Pain    Kristin Hart is a 42 y.o. female with a PMH of diabetes, HTN, polysubstance use, HIV who presented to the ED for dental pain.  Patient reports for the past month, she has had left upper jaw pain.  Reports that yesterday, she started having acutely worse pain over one of her teeth.  Denies drainage from around the area but reports she feels lots of pressure in her face and it appeared swollen today.  Endorses a history of prior dental infection and reports that she was supposed to have a tooth pulled with her dentist, but they deferred the procedure until her blood sugar was better controlled.  She denies fevers, chills, chest pain, shortness of breath.  Denies difficulty swallowing or tolerating oral intake.  Denies vomiting, diarrhea.   Dental Pain      Home Medications Prior to Admission medications   Medication Sig Start Date End Date Taking? Authorizing Provider  amoxicillin-clavulanate (AUGMENTIN) 875-125 MG tablet Take 1 tablet by mouth every 12 (twelve) hours. 01/31/23  Yes Janyth Pupa, MD  acetaminophen (TYLENOL) 500 MG tablet Take 1 tablet (500 mg total) by mouth every 6 (six) hours as needed. 03/06/22   Debby Freiberg, NP  albuterol (VENTOLIN HFA) 108 (90 Base) MCG/ACT inhaler INHALE 1 PUFF INTO THE LUNGS EVERY 4 HOURS AS NEEDED. 05/28/22   Veryl Speak, FNP  bictegravir-emtricitabine-tenofovir AF (BIKTARVY) 50-200-25 MG TABS tablet Take 1 tablet by mouth daily. 12/11/22   Veryl Speak, FNP  cetirizine (ZYRTEC) 10 MG tablet Take 10 mg by mouth daily.    [provider]  cyclobenzaprine (FLEXERIL) 5 MG tablet Take 1 tablet (5 mg total) by mouth at bedtime. Patient taking differently: Take 5 mg by mouth at bedtime as needed for muscle spasms. 12/21/21    Veryl Speak, FNP  diclofenac Sodium (VOLTAREN) 1 % GEL Apply 4 g topically 4 (four) times daily as needed. 10/15/22   Veryl Speak, FNP  hydrochlorothiazide (HYDRODIURIL) 12.5 MG tablet TAKE 1 TABLET(12.5 MG) BY MOUTH DAILY 07/05/22   Veryl Speak, FNP  hydrocortisone cream 1 % Apply to affected area 2 times daily Patient taking differently: Apply 1 Application topically 2 (two) times daily as needed for itching. 08/17/18   Henderly, Britni A, PA-C  linaclotide (LINZESS) 145 MCG CAPS capsule Take 1 capsule (145 mcg total) by mouth daily before breakfast. 11/15/22   Veryl Speak, FNP  mupirocin ointment (BACTROBAN) 2 % Apply 1 Application topically 2 (two) times daily. To affected area till better Patient taking differently: Apply 1 Application topically 2 (two) times daily as needed (wound care). To affected area till better 08/02/22   Veryl Speak, FNP  naproxen (NAPROSYN) 500 MG tablet Take 1 tablet (500 mg total) by mouth 2 (two) times daily with a meal. 10/15/22   Veryl Speak, FNP  nystatin cream (MYCOSTATIN) Apply to affected area 2 times daily Patient taking differently: Apply 1 Application topically 2 (two) times daily as needed for dry skin. 06/17/22   Zenia Resides, MD  omeprazole (PRILOSEC) 40 MG capsule TAKE 1 CAPSULE(40 MG) BY MOUTH DAILY 06/29/22   Veryl Speak, FNP  rizatriptan (MAXALT) 10 MG tablet Take 0.5-1 tablets (5-10 mg total) by mouth as needed for migraine.  May repeat in 2 hours if needed max dose 20 mg/day 10/15/22   Veryl Speak, FNP  topiramate (TOPAMAX) 50 MG tablet Take 1 tablet (50 mg total) by mouth daily. 10/01/22   Veryl Speak, FNP  valACYclovir (VALTREX) 500 MG tablet TAKE 1 TABLET(500 MG) BY MOUTH TWICE DAILY 12/10/22   Veryl Speak, FNP      Allergies    Patient has no known allergies.    Review of Systems   Review of Systems  Physical Exam Updated Vital Signs BP (!) 140/103 (BP Location: Right Arm)   Pulse (!) 113    Temp 98.6 F (37 C) (Oral)   Resp 15   Ht 5' 7.5" (1.715 m)   Wt 70.8 kg   LMP  (LMP Unknown) Comment: last depo unknown  SpO2 99%   BMI 24.07 kg/m  Physical Exam Constitutional:      General: She is not in acute distress.    Appearance: Normal appearance. She is not ill-appearing.  HENT:     Head: Normocephalic and atraumatic.     Comments: Poor dentition, punctate ulceration above left upper canine with tenderness to palpation, no fluctuance.  Uvula midline, tonsils symmetric without swelling.  Mild tenderness palpation along the left lateral mandible with cervical lymphadenopathy palpable    Nose: Nose normal.     Mouth/Throat:     Mouth: Mucous membranes are moist.     Pharynx: Oropharynx is clear.  Eyes:     Pupils: Pupils are equal, round, and reactive to light.  Neck:     Comments: No submandibular tenderness Cardiovascular:     Rate and Rhythm: Tachycardia present.     Heart sounds: Normal heart sounds. No murmur heard.    No friction rub. No gallop.  Abdominal:     Palpations: Abdomen is soft.     Tenderness: There is no abdominal tenderness. There is no guarding or rebound.  Musculoskeletal:     Cervical back: Normal range of motion and neck supple.  Skin:    General: Skin is warm and dry.     Capillary Refill: Capillary refill takes less than 2 seconds.  Neurological:     General: No focal deficit present.     Mental Status: She is alert.    ED Results / Procedures / Treatments   Labs (all labs ordered are listed, but only abnormal results are displayed) Labs Reviewed - No data to display  EKG None  Radiology No results found.  Procedures Procedures    Medications Ordered in ED Medications  acetaminophen (TYLENOL) tablet 1,000 mg (1,000 mg Oral Given 01/31/23 2107)    ED Course/ Medical Decision Making/ A&P                                 Medical Decision Making Risk OTC drugs. Prescription drug management.   Patient's physical exam  concerning for small ulceration that is possibly related to infection above the left upper canine.  There does not appear to be a periapical abscess or drainable fluid collection.  Additionally, I have low suspicion for deep space infection of the neck or submandibular abscess given her physical exam and I suspect her tachycardia is related to significant pain on physical exam and it appears she generally has a heart rate in the 100s per chart review.  Discussed following up with patient's dentist and PCP, she voiced understanding.  Augmentin prescription was  sent to her pharmacy and she was administered Tylenol for pain control.  She return precautions were discussed with the patient, she voiced understanding.  She was discharged in stable condition.        Final Clinical Impression(s) / ED Diagnoses Final diagnoses:  Pain, dental    Rx / DC Orders ED Discharge Orders          Ordered    amoxicillin-clavulanate (AUGMENTIN) 875-125 MG tablet  Every 12 hours        01/31/23 2137              Janyth Pupa, MD 01/31/23 2257    Durwin Glaze, MD 02/01/23 [phone]

## 2023-01-31 NOTE — ED Triage Notes (Signed)
Pt came in via POV d/t thinking she has a dental abscess. Unsure if it is in the top or bottom part of mouth on the Lt side since both are swollen & tender. A/Ox4, rates her pain 10/10, denies any drainage or fevers.

## 2023-02-01 ENCOUNTER — Telehealth: Payer: Self-pay

## 2023-02-01 NOTE — Telephone Encounter (Signed)
Patient came by the office and was requesting to speak with someone from [facility].  No one available from [facility].  I reached out to [place] with [facility] and temporary phone number for the patient given to [place]. Patient stated ok to LVM on the phone numbers provided.  [phone]- patient's mother in law [phone]

## 2023-02-05 ENCOUNTER — Telehealth: Payer: Self-pay

## 2023-02-05 NOTE — Telephone Encounter (Signed)
I reached out to the patient to scheduled her a sooner appointment with [name] to have dental clearance form filled out.  Patient did not answer and LVM for her to call back. Kristin Hart T [name]

## 2023-02-06 ENCOUNTER — Other Ambulatory Visit: Payer: Self-pay | Admitting: Family

## 2023-02-06 MED ORDER — FLUCONAZOLE 150 MG PO TABS: ORAL_TABLET | ORAL | 0 refills | Status: DC

## 2023-02-06 MED ORDER — MICONAZOLE NITRATE 2 % VA CREA: 1.0000 | TOPICAL_CREAM | Freq: Every day | VAGINAL | 0 refills | Status: DC

## 2023-02-21 ENCOUNTER — Ambulatory Visit: Payer: Medicaid Other | Admitting: Infectious Diseases

## 2023-02-26 ENCOUNTER — Other Ambulatory Visit: Payer: Self-pay

## 2023-02-26 ENCOUNTER — Emergency Department (HOSPITAL_COMMUNITY)
Admission: EM | Admit: 2023-02-26 | Discharge: 2023-02-27 | Disposition: A | Discharge status: Home / Self Care | Payer: Medicaid Other | Attending: Emergency Medicine | Admitting: Emergency Medicine

## 2023-02-26 ENCOUNTER — Emergency Department (HOSPITAL_COMMUNITY): Payer: Medicaid Other

## 2023-02-26 DIAGNOSIS — M7989 Other specified soft tissue disorders: Secondary | ICD-10-CM | POA: Diagnosis not present

## 2023-02-26 DIAGNOSIS — I10 Essential (primary) hypertension: Secondary | ICD-10-CM | POA: Diagnosis not present

## 2023-02-26 DIAGNOSIS — L03011 Cellulitis of right finger: Secondary | ICD-10-CM | POA: Insufficient documentation

## 2023-02-26 DIAGNOSIS — Z79899 Other long term (current) drug therapy: Secondary | ICD-10-CM | POA: Diagnosis not present

## 2023-02-26 DIAGNOSIS — M79644 Pain in right finger(s): Secondary | ICD-10-CM | POA: Diagnosis not present

## 2023-02-26 MED ORDER — HYDROCODONE-ACETAMINOPHEN 5-325 MG PO TABS
1.0000 | ORAL_TABLET | Freq: Once | ORAL | Status: AC
  Administered 2023-02-26: 1 via ORAL
  Filled 2023-02-26: qty 1

## 2023-02-26 NOTE — ED Triage Notes (Addendum)
Patient reports right 5th finger infection with swelling this morning , denies injury. Hypertensive at triage ( patient crying/agitated at arrival).

## 2023-02-26 NOTE — ED Provider Triage Note (Signed)
Emergency Medicine Provider Triage Evaluation Note  Kristin Hart , a 42 y.o. female  was evaluated in triage.  Pt complains of right little finger pain, onset this morning. No known injury. Mild redness noted, along with apparent paronychia..  Review of Systems  Positive: Finger pain Negative: Fever, chills  Physical Exam  BP (!) 191/121 (BP Location: Right Arm)   Pulse (!) 123   Temp 98.1 F (36.7 C) (Oral)   Resp 20   LMP  (LMP Unknown) Comment: last depo unknown  SpO2 100%  Gen:   Awake, anxious   Resp:  Normal effort  MSK:   Moves extremities without difficulty  Other:  Apparent paronychia  Medical Decision Making  Medically screening exam initiated at 10:01 PM.  Appropriate orders placed.  Kristin Hart was informed that the remainder of the evaluation will be completed by another provider, this initial triage assessment does not replace that evaluation, and the importance of remaining in the ED until their evaluation is complete.  Patient noted to be hypertensive, likely due to pain. Will recheck blood pressure after pain medications.   Felicie Morn, NP 02/26/23 2216

## 2023-02-27 MED ORDER — FLUCONAZOLE 150 MG PO TABS: 150.0000 mg | ORAL_TABLET | Freq: Once | ORAL | 0 refills | Status: AC

## 2023-02-27 MED ORDER — DOXYCYCLINE HYCLATE 100 MG PO TABS
100.0000 mg | ORAL_TABLET | Freq: Once | ORAL | Status: AC
  Administered 2023-02-27: 100 mg via ORAL
  Filled 2023-02-27: qty 1

## 2023-02-27 MED ORDER — DOXYCYCLINE HYCLATE 100 MG PO CAPS: 100.0000 mg | ORAL_CAPSULE | Freq: Two times a day (BID) | ORAL | 0 refills | Status: DC

## 2023-02-27 MED ORDER — LIDOCAINE HCL (PF) 1 % IJ SOLN
30.0000 mL | Freq: Once | INTRAMUSCULAR | Status: AC
  Administered 2023-02-27: 30 mL
  Filled 2023-02-27: qty 30

## 2023-02-27 NOTE — ED Provider Notes (Signed)
[facility] Emergency Department Provider Note MRN:  [id number]  Arrival date & time: 02/27/23     Chief Complaint   Finger Infection  (Hypertensive)   History of Present Illness   Kristin Hart is a 42 y.o. year-old female presents to the ED with chief complaint of finger pain and infection.  She states that she woke up with a swollen and painful finger.  She denies fever.  She states that she doesn't normally bite her nails, but she does admit to being a "picker."  History provided by patient.   Review of Systems  Pertinent positive and negative review of systems noted in HPI.    Physical Exam   Vitals:   02/27/23 0213 02/27/23 0551  BP: (!) 151/97 (!) 150/100  Pulse: (!) 107 99  Resp: 16 17  Temp: 98.1 F (36.7 C) 98 F (36.7 C)  SpO2: 100% 98%    CONSTITUTIONAL:  anxious-appearing, NAD NEURO:  Alert and oriented x 3, CN 3-12 grossly intact EYES:  eyes equal and reactive ENT/NECK:  Supple, no stridor  CARDIO:  tachycardic, appears well-perfused  PULM:  No respiratory distress,  GI/GU:  non-distended,  MSK/SPINE:  No gross deformities, no edema, moves all extremities  SKIN:  no rash, paronychia to right small finger   *Additional and/or pertinent findings included in MDM below  Diagnostic and Interventional Summary    EKG Interpretation Date/Time:    Ventricular Rate:    PR Interval:    QRS Duration:    QT Interval:    QTC Calculation:   R Axis:      Text Interpretation:         Labs Reviewed - No data to display  DG Finger Little Right  Final Result      Medications  doxycycline (VIBRA-TABS) tablet 100 mg (has no administration in time range)  HYDROcodone-acetaminophen (NORCO/VICODIN) 5-325 MG per tablet 1 tablet (1 tablet Oral Given 02/26/23 2218)  lidocaine (PF) (XYLOCAINE) 1 % injection 30 mL (30 mLs Infiltration Given 02/27/23 5284)     Procedures  /  Critical Care .Incision and Drainage  Date/Time:  02/27/2023 6:26 AM  Performed by: Roxy Horseman, PA-C Authorized by: Roxy Horseman, PA-C   Consent:    Consent obtained:  Verbal   Consent given by:  Patient   Risks discussed:  Bleeding, incomplete drainage, pain and damage to other organs   Alternatives discussed:  No treatment Universal protocol:    Procedure explained and questions answered to patient or proxy's satisfaction: yes     Relevant documents present and verified: yes     Test results available : yes     Imaging studies available: yes     Required blood products, implants, devices, and special equipment available: yes     Site/side marked: yes     Immediately prior to procedure, a time out was called: yes     Patient identity confirmed:  Verbally with patient Location:    Type:  Abscess Pre-procedure details:    Skin preparation:  Betadine Anesthesia:    Anesthesia method:  Nerve block   Block location:  Right 5th finger   Block needle gauge:  27 G   Block anesthetic:  Lidocaine 1% w/o epi   Block technique:  Ring   Block injection procedure:  Introduced needle, anatomic landmarks identified, anatomic landmarks palpated, negative aspiration for blood and incremental injection   Block outcome:  Anesthesia achieved Procedure type:    Complexity:  Simple  Procedure details:    Incision types:  Single straight   Incision depth:  Subcutaneous   Wound management:  Probed and deloculated, irrigated with saline and extensive cleaning   Drainage:  Purulent   Drainage amount:  Moderate   Packing materials:  None Post-procedure details:    Procedure completion:  Tolerated well, no immediate complications   ED Course and Medical Decision Making  I have reviewed the triage vital signs, the nursing notes, and pertinent available records from the EMR.  Social Determinants Affecting Complexity of Care: Patient has no clinically significant social determinants affecting this chief complaint..   ED Course:     Medical Decision Making Patient here with finger tip infection consistent with paronychia.  The paronychia was lanced at the bedside and drained completely.  No evidence of felon.    Will also cover with doxy.  Risk Prescription drug management.         Consultants: No consultations were needed in caring for this patient.   Treatment and Plan: Emergency department workup does not suggest an emergent condition requiring admission or immediate intervention beyond  what has been performed at this time. The patient is safe for discharge and has  been instructed to return immediately for worsening symptoms, change in  symptoms or any other concerns    Final Clinical Impressions(s) / ED Diagnoses     ICD-10-CM   1. Paronychia of finger of right hand  L03.011       ED Discharge Orders          Ordered    doxycycline (VIBRAMYCIN) 100 MG capsule  2 times daily        02/27/23 9528              Discharge Instructions Discussed with and Provided to Patient:   Discharge Instructions   None      Roxy Horseman, PA-C 02/27/23 4132    Zadie Rhine, MD 02/27/23 [phone]

## 2023-02-28 ENCOUNTER — Other Ambulatory Visit: Payer: Self-pay

## 2023-02-28 ENCOUNTER — Encounter: Payer: Self-pay | Admitting: Infectious Diseases

## 2023-02-28 ENCOUNTER — Ambulatory Visit: Payer: Medicaid Other | Admitting: Infectious Diseases

## 2023-02-28 VITALS — BP 134/88 | HR 92 | Temp 97.3°F | Wt 157.0 lb

## 2023-02-28 DIAGNOSIS — L03019 Cellulitis of unspecified finger: Secondary | ICD-10-CM | POA: Diagnosis not present

## 2023-02-28 DIAGNOSIS — E1165 Type 2 diabetes mellitus with hyperglycemia: Secondary | ICD-10-CM

## 2023-02-28 DIAGNOSIS — H538 Other visual disturbances: Secondary | ICD-10-CM

## 2023-02-28 DIAGNOSIS — Z7984 Long term (current) use of oral hypoglycemic drugs: Secondary | ICD-10-CM | POA: Diagnosis not present

## 2023-02-28 DIAGNOSIS — B20 Human immunodeficiency virus [HIV] disease: Secondary | ICD-10-CM

## 2023-02-28 DIAGNOSIS — F424 Excoriation (skin-picking) disorder: Secondary | ICD-10-CM

## 2023-02-28 NOTE — Progress Notes (Signed)
Brief Narrative   Patient ID: Kristin Hart, female    DOB: 10-23-1980, 42 y.o.   MRN: [id number]  Kristin Hart is a 42 y/o caucasian female diagnosed with HIV disease on 01/02/19 with risk factor of heterosexual contact and IV drug use. Initial viral load on 06/23/19 23,300 and CD4 count 577. Genotype with no significant drug resistance. Entered care at [facility] Stage 1. [id number] negative. No history of opportunistic infection. Sole medication regimen of Biktarvy    Subjective:    Chief Complaint  Patient presents with   Follow-up     Discussed the use of AI scribe software for clinical note transcription with the patient, who gave verbal consent to proceed.  History of Present Illness   Kristin Hart, with a history of diabetes and HIV, is here today for follow up. She went to ER last night for finger infection. She reported having undergone a surgical procedure on her pinky finger due to an infected nail bed, which she described as extremely painful. The infection was drained in the ER, and the patient was prescribed doxycycline and fluconazole, the latter due to a history of yeast infections a/w antibiotics. The patient reported significant improvement in pain following the drainage procedure.  The patient also reported a long-standing habit of skin picking, which she has been engaging in since childhood. She described this as a response to stress and anxiety and expressed a desire to stop. The patient reported picking at various areas of her body, including her hands, back, and neck. She also noted that she has been experiencing vision problems including blurry vision.   The patient's diabetes control appears to be suboptimal, with a recent HbA1c of 11.4, indicating an average blood glucose level of around 300. She is currently on glipizide for diabetes management. The patient also reported a recent episode of hyperglycemia, with a blood glucose level over 300 during a recent ER visit.  She also  has a history of methamphetamine use, which she acknowledged could exacerbate her skin picking habit. The patient expressed a desire to manage her health better and showed willingness to engage with endocrinology for better diabetes management.       No Known Allergies    Outpatient Medications Prior to Visit  Medication Sig Dispense Refill   acetaminophen (TYLENOL) 500 MG tablet Take 1 tablet (500 mg total) by mouth every 6 (six) hours as needed. 30 tablet 0   albuterol (VENTOLIN HFA) 108 (90 Base) MCG/ACT inhaler INHALE 1 PUFF INTO THE LUNGS EVERY 4 HOURS AS NEEDED. 18 each 2   bictegravir-emtricitabine-tenofovir AF (BIKTARVY) 50-200-25 MG TABS tablet Take 1 tablet by mouth daily. 30 tablet 5   cetirizine (ZYRTEC) 10 MG tablet Take 10 mg by mouth daily.     cyclobenzaprine (FLEXERIL) 5 MG tablet Take 1 tablet (5 mg total) by mouth at bedtime. (Patient taking differently: Take 5 mg by mouth at bedtime as needed for muscle spasms.) 30 tablet 4   diclofenac Sodium (VOLTAREN) 1 % GEL Apply 4 g topically 4 (four) times daily as needed. 350 g 0   doxycycline (VIBRAMYCIN) 100 MG capsule Take 1 capsule (100 mg total) by mouth 2 (two) times daily. 20 capsule 0   hydrochlorothiazide (HYDRODIURIL) 12.5 MG tablet TAKE 1 TABLET(12.5 MG) BY MOUTH DAILY 90 tablet 1   hydrocortisone cream 1 % Apply to affected area 2 times daily (Patient taking differently: Apply 1 Application topically 2 (two) times daily as needed for itching.) 15 g 0  miconazole (MONISTAT 7) 2 % vaginal cream Place 1 Applicatorful vaginally at bedtime. 45 g 0   mupirocin ointment (BACTROBAN) 2 % Apply 1 Application topically 2 (two) times daily. To affected area till better (Patient taking differently: Apply 1 Application topically 2 (two) times daily as needed (wound care). To affected area till better) 22 g 0   naproxen (NAPROSYN) 500 MG tablet Take 1 tablet (500 mg total) by mouth 2 (two) times daily with a meal. 60 tablet 2    nystatin cream (MYCOSTATIN) Apply to affected area 2 times daily (Patient taking differently: Apply 1 Application topically 2 (two) times daily as needed for dry skin.) 30 g 0   omeprazole (PRILOSEC) 40 MG capsule TAKE 1 CAPSULE(40 MG) BY MOUTH DAILY 90 capsule 1   rizatriptan (MAXALT) 10 MG tablet Take 0.5-1 tablets (5-10 mg total) by mouth as needed for migraine. May repeat in 2 hours if needed max dose 20 mg/day 10 tablet 1   topiramate (TOPAMAX) 50 MG tablet Take 1 tablet (50 mg total) by mouth daily. 30 tablet 5   valACYclovir (VALTREX) 500 MG tablet TAKE 1 TABLET(500 MG) BY MOUTH TWICE DAILY 60 tablet 2   linaclotide (LINZESS) 145 MCG CAPS capsule Take 1 capsule (145 mcg total) by mouth daily before breakfast. (Patient not taking: Reported on 02/28/2023) 90 capsule 1   No facility-administered medications prior to visit.     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, per pt   Depression    Diabetes mellitus without complication (HCC)    type 2   Foreign body in small intestine    GERD (gastroesophageal reflux disease)    HIV (human immunodeficiency virus infection) (HCC)    Hypothyroidism    Stomach ulcer    Substance abuse (HCC)    Meth, IV drug use     Past Surgical History:  Procedure Laterality Date   CESAREAN SECTION N/A 07/04/2016   Procedure: CESAREAN SECTION;  Surgeon: [name], DO;  Location: [facility] BIRTHING SUITES;  Service: Obstetrics;  Laterality: N/A;   ENTEROSCOPY N/A 11/08/2017   Procedure: ENTEROSCOPY;  Surgeon: Hilarie Fredrickson, MD;  Location: [facility] ENDOSCOPY;  Service: Endoscopy;  Laterality: N/A;   NO PAST SURGERIES        Review of Systems  All other systems reviewed and are negative.      Objective:    BP 134/88   Pulse 92   Temp (!) 97.3 F (36.3 C) (Temporal)   Wt 157 lb (71.2 kg)   LMP  (LMP Unknown)  Comment: last depo unknown  SpO2 100%   BMI 24.23 kg/m  Nursing note and vital signs reviewed.   Physical Exam Musculoskeletal:     Comments: Right pinky finger wrapped in bandage. No erythema noted on exposed skin. Swollen.   Skin:    Comments: Dorsum of b/l hands with full thickness skin loss c/w picking         12/11/2022    3:39 PM 11/15/2022    2:36 PM 10/15/2022    3:18 PM 08/29/2022    4:22 PM 07/05/2022   10:51 AM  Depression screen PHQ 2/9  Decreased Interest 0 0 0 0   Down, Depressed, Hopeless 0 0 1 0 1  PHQ - 2 Score 0 0 1 0 1  Assessment & Plan:     Paronychia -  Recent abscess drained in ER, currently on Doxycycline. -Continue Doxycycline as prescribed. -Advised to eat before taking medication to prevent potential nausea.  Skin Picking -  Chronic issue, possibly related to stress and anxiety; exacerbated by methamphetamine use. No sites appear infected today. She wears gloves to deter her.  -Consider finding a counselor or therapist to help manage stress and anxiety. -May benefit from daily SSRI to help with underlying management.  -Consider creative outlets such as painting or writing as a diversion.  Diabetes Mellitus -  Recent A1c was 11.4, indicating poor control. -Continue Glipizide as prescribed. -Referral to endocrinology for further management. -Check blood sugar levels more frequently. -She has their phone number to schedule, her phone is currently off. I will put in referral notes to also reach out to mychart.   Visual Disturbances - Recent issues with vision, possibly related to poorly controlled diabetes. -Referral to specialist for evaluation.  HIV Well controlled, no recent changes. -Continue current HIV management. -Plan for routine blood work twice a year. -She can return for follow up with [name] in 1-2 months as she prefers  -Pap smear UTD.      Contraception Management -  Depo injection last provided 01/31/23 -Due Feb  6-20th next   No orders of the defined types were placed in this encounter.  Orders Placed This Encounter  Procedures   Ambulatory referral to Endocrinology    Referral Priority:   Routine    Referral Type:   Consultation    Referral Reason:   Specialty Services Required    Number of Visits Requested:   1   Ambulatory referral to Ophthalmology    Referral Priority:   Routine    Referral Type:   Consultation    Referral Reason:   Specialty Services Required    Referred to Provider:   Sallye Lat, MD    Requested Specialty:   Ophthalmology    Number of Visits Requested:   1   Return in about 2 months (around 05/01/2023).   Rexene Alberts, MSN, NP-C [facility] for Infectious Disease [facility]  [email or web address] Pager: [phone] Office: [phone] [facility] Main Line: [phone] *Secure Chat Communication Welcome

## 2023-02-28 NOTE — Patient Instructions (Addendum)
  Please call for a new patient appointment with endocrinology to help with your diabetes.  Address: [address] [place], [place] [address] Phone: [phone]  Please come back to see [name] in 1-2 months based on your preference.

## 2023-03-11 ENCOUNTER — Ambulatory Visit: Payer: Medicaid Other | Admitting: Family Medicine

## 2023-03-11 ENCOUNTER — Encounter: Payer: Self-pay | Admitting: Family Medicine

## 2023-03-11 VITALS — BP 130/89 | HR 104 | Wt 156.4 lb

## 2023-03-11 DIAGNOSIS — E1165 Type 2 diabetes mellitus with hyperglycemia: Secondary | ICD-10-CM

## 2023-03-11 DIAGNOSIS — I158 Other secondary hypertension: Secondary | ICD-10-CM

## 2023-03-11 DIAGNOSIS — B20 Human immunodeficiency virus [HIV] disease: Secondary | ICD-10-CM

## 2023-03-11 DIAGNOSIS — F1520 Other stimulant dependence, uncomplicated: Secondary | ICD-10-CM

## 2023-03-11 DIAGNOSIS — L853 Xerosis cutis: Secondary | ICD-10-CM | POA: Diagnosis not present

## 2023-03-11 NOTE — Progress Notes (Cosign Needed)
    SUBJECTIVE:   CHIEF COMPLAINT / HPI:   Preoperative exam, T2DM Would like to get multiple teeth removed with her dentist.  Some teeth are broken after using methamphetamine.  Labs performed by dentist indicated elevated A1c, however.  She states she is taking glipizide at home, though she is unsure what dosage.  She does not have a glucose monitor to check her sugar.  Methamphetamine use Persistent difficulty with cessation.  This helps her to have more energy during the day, because otherwise, she would be too sleepy to do anything.  Does not use it really to get high.  Dry hands Has pain at the tips of her fingers where her fingers get dry and fissure.  She has been keeping antibiotic ointment on the areas, though this is still persistent.  HIV Follows with infectious disease.  Is using Biktarvy with controlled viral load.  PERTINENT  PMH / PSH: Hypertension, depression, anxiety, ADD, poor social situation, marijuana use, self-mutilation, genital HSV, asthma, migraines  OBJECTIVE:   BP 130/89   Pulse (!) 104   Wt 156 lb 6.4 oz (70.9 kg)   SpO2 98%   BMI 24.13 kg/m   General: Alert and oriented, in NAD Skin: Warm, dry, and intact, multiple fingers with mild fissuring at the tips bilaterally without bleeding, drainage, severe erythema HEENT: NCAT, EOM grossly normal, midline nasal septum, poor dentition Cardiac: RRR, no m/r/g appreciated Respiratory: CTAB, breathing and speaking comfortably on RA Extremities: Moves all extremities grossly equally Neurological: No gross focal deficit Psychiatric: Appropriate mood and affect though intermittently tearful when discussing methamphetamine use  ASSESSMENT/PLAN:   Hypertension Reasonable today with HCTZ.  Bring all medications into next appointment and reassess BP and need for treatment augmentation.  Type 2 diabetes mellitus with hyperglycemia (HCC) A1c 11.4.  Given this, we need to hold off on surgery until better control.   Patient states she is on glipizide, but she is unsure of what dose.  Will send in glipizide 5 mg daily, and instructed patient to not take glipizide she has at home.  Will also send in metformin 500 mg XR daily.  Discussed initiation of insulin at this A1c level, though patient is very averse to needles.  Will send in glucose monitor and lancets for patient to check glucose while on this regimen.  Return in 3 days for updated A1c and labs.  Return in 2 weeks to review labs and further medication regimen.  Methamphetamine use disorder, severe, dependence (HCC) Continued use.  Patient expresses want to stop use.  Appears to have been given resources in the past from ID.  Will continue to follow-up cessation efforts.  HIV disease (HCC) Well-controlled on Biktarvy.  Continue follow-up with ID.   Dry skin Fissuring of fingertips likely due to dry hands and winter.  Reassuringly without signs of infection.  Gave samples of Vaseline in the office along with gloves.  Apply Vaseline to hands and close overnight to help lock in moisture.  Follow-up next visit.   Janeal Holmes, MD [facility]

## 2023-03-11 NOTE — Patient Instructions (Signed)
I will send in glipizide and metformin. Only take these. I will also send in a glucose monitor to check your sugar. Check it at least twice daily in the morning and at night. Your labs are scheduled as below. Come back at your next appointment as below.

## 2023-03-12 ENCOUNTER — Encounter: Payer: Self-pay | Admitting: Family Medicine

## 2023-03-12 DIAGNOSIS — L853 Xerosis cutis: Secondary | ICD-10-CM | POA: Insufficient documentation

## 2023-03-12 MED ORDER — METFORMIN HCL ER 500 MG PO TB24: 500.0000 mg | ORAL_TABLET | Freq: Every day | ORAL | 3 refills | Status: DC

## 2023-03-12 MED ORDER — GLIPIZIDE ER 5 MG PO TB24: 5.0000 mg | ORAL_TABLET | Freq: Every day | ORAL | 3 refills | Status: DC

## 2023-03-12 MED ORDER — BLOOD GLUCOSE MONITOR SYSTEM W/DEVICE KIT: 1.0000 | PACK | Freq: Every day | 0 refills | Status: DC

## 2023-03-12 NOTE — Assessment & Plan Note (Signed)
Reasonable today with HCTZ.  Bring all medications into next appointment and reassess BP and need for treatment augmentation.

## 2023-03-12 NOTE — Assessment & Plan Note (Signed)
Continued use.  Patient expresses want to stop use.  Appears to have been given resources in the past from ID.  Will continue to follow-up cessation efforts.

## 2023-03-12 NOTE — Assessment & Plan Note (Signed)
Well-controlled on Biktarvy.  Continue follow-up with ID.

## 2023-03-12 NOTE — Assessment & Plan Note (Signed)
Fissuring of fingertips likely due to dry hands and winter.  Reassuringly without signs of infection.  Gave samples of Vaseline in the office along with gloves.  Apply Vaseline to hands and close overnight to help lock in moisture.  Follow-up next visit.

## 2023-03-12 NOTE — Assessment & Plan Note (Signed)
 A1c 11.4.  Given this, we need to hold off on surgery until better control.  Patient states she is on glipizide , but she is unsure of what dose.  Will send in glipizide  5 mg daily, and instructed patient to not take glipizide  she has at home.  Will also send in metformin  500 mg XR daily.  Discussed initiation of insulin at this A1c level, though patient is very averse to needles.  Will send in glucose monitor and lancets for patient to check glucose while on this regimen.  Return in 3 days for updated A1c and labs.  Return in 2 weeks to review labs and further medication regimen.

## 2023-03-14 ENCOUNTER — Other Ambulatory Visit: Payer: Medicaid Other

## 2023-03-14 DIAGNOSIS — E1165 Type 2 diabetes mellitus with hyperglycemia: Secondary | ICD-10-CM | POA: Diagnosis not present

## 2023-03-14 DIAGNOSIS — B20 Human immunodeficiency virus [HIV] disease: Secondary | ICD-10-CM | POA: Diagnosis not present

## 2023-03-15 ENCOUNTER — Encounter: Payer: Self-pay | Admitting: Family Medicine

## 2023-03-15 LAB — CBC WITH DIFFERENTIAL/PLATELET
Basophils Absolute: 0 10*3/uL (ref 0.0–0.2)
Basos: 1 %
EOS (ABSOLUTE): 0.2 10*3/uL (ref 0.0–0.4)
Eos: 3 %
Hematocrit: 41.2 % (ref 34.0–46.6)
Hemoglobin: 13.1 g/dL (ref 11.1–15.9)
Immature Grans (Abs): 0 10*3/uL (ref 0.0–0.1)
Immature Granulocytes: 0 %
Lymphocytes Absolute: 2 10*3/uL (ref 0.7–3.1)
Lymphs: 31 %
MCH: 27.7 pg (ref 26.6–33.0)
MCHC: 31.8 g/dL (ref 31.5–35.7)
MCV: 87 fL (ref 79–97)
Monocytes Absolute: 0.5 10*3/uL (ref 0.1–0.9)
Monocytes: 7 %
Neutrophils Absolute: 3.7 10*3/uL (ref 1.4–7.0)
Neutrophils: 58 %
Platelets: 306 10*3/uL (ref 150–450)
RBC: 4.73 x10E6/uL (ref 3.77–5.28)
RDW: 13.5 % (ref 11.7–15.4)
WBC: 6.5 10*3/uL (ref 3.4–10.8)

## 2023-03-15 LAB — BASIC METABOLIC PANEL
BUN/Creatinine Ratio: 19 (ref 9–23)
BUN: 19 mg/dL (ref 6–24)
CO2: 20 mmol/L (ref 20–29)
Calcium: 9.6 mg/dL (ref 8.7–10.2)
Chloride: 101 mmol/L (ref 96–106)
Creatinine, Ser: 0.98 mg/dL (ref 0.57–1.00)
Glucose: 487 mg/dL — ABNORMAL HIGH (ref 70–99)
Potassium: 4.2 mmol/L (ref 3.5–5.2)
Sodium: 135 mmol/L (ref 134–144)
eGFR: 74 mL/min/{1.73_m2} (ref >59)

## 2023-03-15 LAB — LIPID PANEL
Chol/HDL Ratio: 3.6 {ratio} (ref 0.0–4.4)
Cholesterol, Total: 179 mg/dL (ref 100–199)
HDL: 50 mg/dL (ref >39)
LDL Chol Calc (NIH): 111 mg/dL — ABNORMAL HIGH (ref 0–99)
Triglycerides: 101 mg/dL (ref 0–149)
VLDL Cholesterol Cal: 18 mg/dL (ref 5–40)

## 2023-03-25 ENCOUNTER — Ambulatory Visit (INDEPENDENT_AMBULATORY_CARE_PROVIDER_SITE_OTHER): Payer: Medicaid Other | Admitting: Family Medicine

## 2023-03-25 ENCOUNTER — Encounter: Payer: Self-pay | Admitting: Family Medicine

## 2023-03-25 VITALS — BP 140/93 | HR 108 | Ht 67.0 in | Wt 160.6 lb

## 2023-03-25 DIAGNOSIS — Z7984 Long term (current) use of oral hypoglycemic drugs: Secondary | ICD-10-CM | POA: Diagnosis not present

## 2023-03-25 DIAGNOSIS — F1520 Other stimulant dependence, uncomplicated: Secondary | ICD-10-CM | POA: Diagnosis not present

## 2023-03-25 DIAGNOSIS — B20 Human immunodeficiency virus [HIV] disease: Secondary | ICD-10-CM | POA: Diagnosis not present

## 2023-03-25 DIAGNOSIS — E1165 Type 2 diabetes mellitus with hyperglycemia: Secondary | ICD-10-CM

## 2023-03-25 DIAGNOSIS — I158 Other secondary hypertension: Secondary | ICD-10-CM | POA: Diagnosis not present

## 2023-03-25 LAB — GLUCOSE, POCT (MANUAL RESULT ENTRY): POC Glucose: 320 mg/dL — AB (ref 70–99)

## 2023-03-25 LAB — POCT GLYCOSYLATED HEMOGLOBIN (HGB A1C): HbA1c, POC (controlled diabetic range): 11.2 % — AB (ref 0.0–7.0)

## 2023-03-25 MED ORDER — PITAVASTATIN CALCIUM 2 MG PO TABS: 2.0000 mg | ORAL_TABLET | Freq: Every day | ORAL | 0 refills | Status: DC

## 2023-03-25 MED ORDER — LOSARTAN POTASSIUM-HCTZ 50-12.5 MG PO TABS: 1.0000 | ORAL_TABLET | Freq: Every day | ORAL | 0 refills | Status: DC

## 2023-03-25 NOTE — Patient Instructions (Addendum)
 I have sent in pitavastatin  for your cholesterol levels. I sent in another blood pressure medicine. Only take this one. Take one metformin  in the morning and one in the evening for now. Take one glipizide  in the morning and one in the evening. See me in 1 month for recheck.

## 2023-03-25 NOTE — Progress Notes (Signed)
    SUBJECTIVE:   CHIEF COMPLAINT / HPI:   Type 2 diabetes Has been taking glipizide  5 mg daily as well as metformin  500 mg daily without issue.  Has not been able to pick up glucose monitor yet due to over-the-counter cost.   Hypertension Has been taking hydrochlorothiazide  daily without issue.  HIV Taking her Biktarvy .  CD4 count in September 2024 973.  Methamphetamine use Politely declines further resources today concerning use.  PERTINENT  PMH / PSH: migraines, dry skin, HSV, HLD, depression, anxiety, ADD  OBJECTIVE:   BP (!) 140/93   Pulse (!) 108   Ht 5' 7 (1.702 m)   Wt 160 lb 9.6 oz (72.8 kg)   SpO2 99%   BMI 25.15 kg/m   General: Alert and oriented, in NAD Skin: Warm, dry; improved dry skin and fissuring of all fingers HEENT: NCAT, EOM grossly normal, midline nasal septum Cardiac: RRR, no m/r/g appreciated Respiratory: CTAB, breathing and speaking comfortably on RA Abdominal: Soft, nontender, nondistended, normoactive bowel sounds Extremities: Moves all extremities grossly equally Neurological: No gross focal deficit Psychiatric: Appropriate mood and affect   ASSESSMENT/PLAN:   Type 2 diabetes mellitus with hyperglycemia (HCC) Has been tolerating glipizide  and metformin  daily.  Point-of-care glucose today 320 and repeat A1c 11.2.  Will increase glipizide  to 10 mg in the morning.  Increase metformin  to 500 mg XR twice daily.  Again, unfortunately patient is averse to needles for insulin initiation.  Would also not like to start SGLT2 with this level of A1c.  Discussed letting me know if having side effects with increased medication.  Urine microalbumin/creatinine collected today.  Return in 1 month or sooner if needed.  Hypertension Near goal today on HCTZ alone.  Will switch to losartan -HCTZ 50-12.5 mg daily today.  ARB component can also help with renal protection.  Follow-up in 1 month and consider recheck of kidney function.  HIV disease (HCC) Following  with ID and continuing Biktarvy .  Will add on pitavastatin  2 mg daily given less interactions with ART and with indications of T2DM and HIV.  Advised to let me know if she has any worsening myalgias with this medication.  Methamphetamine use disorder, severe, dependence (HCC) Briefly discussed today, and patient is still in contemplation phase of quitting.  Advised to let me know when she is ready for more resources.   Health maintenance Consider cervical cancer screening and foot exam at next visit.  Stuart Redo, MD [facility]

## 2023-03-26 LAB — MICROALBUMIN / CREATININE URINE RATIO
Creatinine, Urine: 87.2 mg/dL
Microalb/Creat Ratio: 10 mg/g{creat} (ref 0–29)
Microalbumin, Urine: 9.1 ug/mL

## 2023-03-26 NOTE — Assessment & Plan Note (Addendum)
 Has been tolerating glipizide  and metformin  daily.  Point-of-care glucose today 320 and repeat A1c 11.2.  Will increase glipizide  to 10 mg in the morning.  Increase metformin  to 500 mg XR twice daily.  Again, unfortunately patient is averse to needles for insulin initiation.  Would also not like to start SGLT2 with this level of A1c.  Discussed letting me know if having side effects with increased medication.  Urine microalbumin/creatinine collected today.  Return in 1 month or sooner if needed.

## 2023-03-26 NOTE — Assessment & Plan Note (Addendum)
 Near goal today on HCTZ alone.  Will switch to losartan-HCTZ 50-12.5 mg daily today.  ARB component can also help with renal protection.  Follow-up in 1 month and consider recheck of kidney function.

## 2023-03-26 NOTE — Assessment & Plan Note (Signed)
 Briefly discussed today, and patient is still in contemplation phase of quitting.  Advised to let me know when she is ready for more resources.

## 2023-03-26 NOTE — Assessment & Plan Note (Signed)
 Following with ID and continuing Biktarvy.  Will add on pitavastatin 2 mg daily given less interactions with ART and with indications of T2DM and HIV.  Advised to let me know if she has any worsening myalgias with this medication.

## 2023-04-14 ENCOUNTER — Other Ambulatory Visit: Payer: Self-pay | Admitting: Family

## 2023-04-15 NOTE — Telephone Encounter (Signed)
Continue cetrizine per last office note.   Sandie Ano, RN

## 2023-04-18 ENCOUNTER — Ambulatory Visit: Payer: Medicaid Other

## 2023-04-18 ENCOUNTER — Telehealth: Payer: Self-pay

## 2023-04-18 ENCOUNTER — Other Ambulatory Visit: Payer: Self-pay

## 2023-04-18 DIAGNOSIS — Z3009 Encounter for other general counseling and advice on contraception: Secondary | ICD-10-CM

## 2023-04-18 DIAGNOSIS — J4521 Mild intermittent asthma with (acute) exacerbation: Secondary | ICD-10-CM

## 2023-04-18 DIAGNOSIS — Z3042 Encounter for surveillance of injectable contraceptive: Secondary | ICD-10-CM | POA: Diagnosis not present

## 2023-04-18 MED ORDER — MEDROXYPROGESTERONE ACETATE 150 MG/ML IM SUSP
150.0000 mg | Freq: Once | INTRAMUSCULAR | Status: AC
  Administered 2023-04-18: 150 mg via INTRAMUSCULAR

## 2023-04-18 MED ORDER — ALBUTEROL SULFATE HFA 108 (90 BASE) MCG/ACT IN AERS: INHALATION_SPRAY | RESPIRATORY_TRACT | 2 refills | Status: DC

## 2023-04-18 NOTE — Telephone Encounter (Signed)
 Patient came into office today for Depo inj. During today's visit she states she is having some concerns regarding neck stiffness/ pain. Has been going on for two weeks. Has tried using lidocaine  patches, tylenol , warm compress, heat pad. Has not had any relief. Pt also states for two days she has had sore throat/cough/congestion. Household had flu last week. Has not tested. Is not taking anything to help with symptoms. Advised patient to try over the counter medication to help treat symptoms, but also reach out to PCP for testing.  She is also requesting refill on Albuterol  inhaler. Has been feeling more SOB for the past few weeks.    Lorenda CHRISTELLA Code, RMA

## 2023-04-18 NOTE — Addendum Note (Signed)
 Addended by: Baxter Gonzalez D on: 04/18/2023 04:55 PM   Modules accepted: Orders

## 2023-04-19 ENCOUNTER — Encounter: Payer: Self-pay | Admitting: Family Medicine

## 2023-04-19 MED ORDER — OSELTAMIVIR PHOSPHATE 75 MG PO CAPS: 75.0000 mg | ORAL_CAPSULE | Freq: Two times a day (BID) | ORAL | 0 refills | Status: AC

## 2023-04-19 NOTE — Telephone Encounter (Signed)
 Attempted to call patient. Not able to reach her at this time. Left vm.  Julien Odor, RMA

## 2023-04-23 ENCOUNTER — Ambulatory Visit (INDEPENDENT_AMBULATORY_CARE_PROVIDER_SITE_OTHER): Payer: Medicaid Other | Admitting: Family Medicine

## 2023-04-23 VITALS — BP 130/80 | HR 129 | Ht 67.5 in | Wt 160.4 lb

## 2023-04-23 DIAGNOSIS — F1520 Other stimulant dependence, uncomplicated: Secondary | ICD-10-CM | POA: Diagnosis not present

## 2023-04-23 DIAGNOSIS — A6004 Herpesviral vulvovaginitis: Secondary | ICD-10-CM | POA: Diagnosis not present

## 2023-04-23 DIAGNOSIS — L089 Local infection of the skin and subcutaneous tissue, unspecified: Secondary | ICD-10-CM

## 2023-04-23 DIAGNOSIS — E1165 Type 2 diabetes mellitus with hyperglycemia: Secondary | ICD-10-CM

## 2023-04-23 DIAGNOSIS — I158 Other secondary hypertension: Secondary | ICD-10-CM | POA: Diagnosis not present

## 2023-04-23 DIAGNOSIS — B20 Human immunodeficiency virus [HIV] disease: Secondary | ICD-10-CM | POA: Diagnosis not present

## 2023-04-23 MED ORDER — DOXYCYCLINE HYCLATE 100 MG PO TABS: 100.0000 mg | ORAL_TABLET | Freq: Two times a day (BID) | ORAL | 0 refills | Status: AC

## 2023-04-23 NOTE — Patient Instructions (Addendum)
I am happy to hear that you are sick symptoms are getting better.  If you continue to have problems with this restart to get worse again, let me know or go to the ED.  Be sure to take your medications as soon as possible.  I recommend taking Valtrex 1 g twice a day for 5 days and then going back down to your usual dosing.  I have also sent in doxycycline to be taken twice a day for 7 days for your hand.  Let me know if this does not improve or if you start to have fevers or worsening erythema.  Come back in 1 month to follow-up or before then if you need me!

## 2023-04-23 NOTE — Progress Notes (Signed)
    SUBJECTIVE:   CHIEF COMPLAINT / HPI:   Sick symptoms Had multiple sick symptoms consistent with her son who had the flu. She was given Tamiflu given her increased risk of complications.  Neck lump Has been present for about a month and feeling like a "crick" in her neck. It also has a burning feeling to it. She has tried lidocaine patches, tylenol, warm compress, and heating pads.  HTN Placed on losartan-hydrochlorothiazide 50-12.5 mg daily at last visit.  T2DM Continuing glipizide 10 mg daily and metformin 500 mg BID.  Hand and genital lesion 2-day history.  PERTINENT  PMH / PSH: ***  OBJECTIVE:   BP 130/80   Pulse (!) 129   Ht 5' 7.5" (1.715 m)   Wt 160 lb 6.4 oz (72.8 kg)   SpO2 98%   BMI 24.75 kg/m   General: Alert and oriented, in NAD Skin: Warm, dry, and intact without lesions HEENT: NCAT, EOM grossly normal, midline nasal septum Cardiac: RRR, no m/r/g appreciated Respiratory: CTAB, breathing and speaking comfortably on RA Abdominal: Soft, nontender, nondistended, normoactive bowel sounds Extremities: Moves all extremities grossly equally Neurological: No gross focal deficit Psychiatric: Appropriate mood and affect   ASSESSMENT/PLAN:   No problem-specific Assessment & Plan notes found for this encounter.  Health maintenance ***  Janeal Holmes, MD [facility]

## 2023-04-24 DIAGNOSIS — L089 Local infection of the skin and subcutaneous tissue, unspecified: Secondary | ICD-10-CM | POA: Insufficient documentation

## 2023-04-24 MED ORDER — FLUCONAZOLE 150 MG PO TABS: 150.0000 mg | ORAL_TABLET | Freq: Once | ORAL | 0 refills | Status: AC

## 2023-04-24 NOTE — Addendum Note (Signed)
Addended by: Evette Georges B on: 04/24/2023 10:51 AM   Modules accepted: Orders

## 2023-04-24 NOTE — Assessment & Plan Note (Addendum)
Discussed resuming Biktarvy today while feeling better from likely the flu.

## 2023-04-24 NOTE — Assessment & Plan Note (Signed)
Right at goal without medication management.  Discussed taking medications at home for long-term renal health.

## 2023-04-24 NOTE — Assessment & Plan Note (Signed)
As demonstrated on exam.  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  However, now appears to be superinfected.  Discussed doxycycline 100 mg twice daily for 7 days.  Return precautions of worsening redness, pain, drainage, or fevers discussed.

## 2023-04-24 NOTE — Assessment & Plan Note (Signed)
Unfortunately unable to take her medications while sick.  Discussed taking her medications when she gets home now that she is feeling better.  Follow-up in 1 month.

## 2023-04-24 NOTE — Assessment & Plan Note (Signed)
Patient endorsed usage before appointment, likely contributing to tachycardia and mydriasis.  She appears overall comfortable on exam.  Will continue to offer support and resources for cessation as desired.

## 2023-04-24 NOTE — Assessment & Plan Note (Addendum)
As appreciated on exam.  Recurred in setting of not taking her Valtrex while sick.  Discussed increasing dose to 1 g Valtrex twice daily for 5 days and then returning to her normal regimen of 500 mg twice daily.  Advised to let me know if this does not improve.

## 2023-04-29 ENCOUNTER — Other Ambulatory Visit: Payer: Self-pay | Admitting: Family

## 2023-04-29 ENCOUNTER — Ambulatory Visit (INDEPENDENT_AMBULATORY_CARE_PROVIDER_SITE_OTHER): Payer: Medicaid Other | Admitting: Family

## 2023-04-29 ENCOUNTER — Encounter: Payer: Self-pay | Admitting: Family

## 2023-04-29 ENCOUNTER — Other Ambulatory Visit: Payer: Self-pay | Admitting: Family Medicine

## 2023-04-29 ENCOUNTER — Other Ambulatory Visit: Payer: Self-pay

## 2023-04-29 VITALS — BP 150/95 | HR 113 | Temp 98.3°F | Resp 16 | Wt 160.0 lb

## 2023-04-29 DIAGNOSIS — Z5982 Transportation insecurity: Secondary | ICD-10-CM

## 2023-04-29 DIAGNOSIS — Z609 Problem related to social environment, unspecified: Secondary | ICD-10-CM

## 2023-04-29 DIAGNOSIS — F1520 Other stimulant dependence, uncomplicated: Secondary | ICD-10-CM

## 2023-04-29 DIAGNOSIS — L089 Local infection of the skin and subcutaneous tissue, unspecified: Secondary | ICD-10-CM

## 2023-04-29 DIAGNOSIS — I10 Essential (primary) hypertension: Secondary | ICD-10-CM

## 2023-04-29 DIAGNOSIS — B20 Human immunodeficiency virus [HIV] disease: Secondary | ICD-10-CM

## 2023-04-29 DIAGNOSIS — Z59819 Housing instability, housed unspecified: Secondary | ICD-10-CM

## 2023-04-29 DIAGNOSIS — E1165 Type 2 diabetes mellitus with hyperglycemia: Secondary | ICD-10-CM

## 2023-04-29 DIAGNOSIS — F99 Mental disorder, not otherwise specified: Secondary | ICD-10-CM

## 2023-04-29 DIAGNOSIS — Z599 Problem related to housing and economic circumstances, unspecified: Secondary | ICD-10-CM | POA: Diagnosis not present

## 2023-04-29 DIAGNOSIS — I158 Other secondary hypertension: Secondary | ICD-10-CM

## 2023-04-29 DIAGNOSIS — Z0000 Encounter for general adult medical examination without abnormal findings: Secondary | ICD-10-CM

## 2023-04-29 MED ORDER — BIKTARVY 50-200-25 MG PO TABS: 1.0000 | ORAL_TABLET | Freq: Every day | ORAL | 5 refills | Status: DC

## 2023-04-29 NOTE — Assessment & Plan Note (Signed)
Ms. Murley social situation remains challenging secondary to continued methamphetamine usage.  Discussed possibility of rehabilitation.  She is not ready for treatment at this time.  Counseling offered.

## 2023-04-29 NOTE — Progress Notes (Signed)
Brief Narrative   Patient ID: Kristin Hart, female    DOB: [birth year], 43 y.o.   MRN: [id number]  Ms. Runions is a 43 y/o caucasian female diagnosed with HIV disease on 01/02/19 with risk factor of heterosexual contact and IV drug use. Initial viral load on 06/23/19 23,300 and CD4 count 577. Genotype with no significant drug resistance. Entered care at [facility] Stage 1. [id number] negative. No history of opportunistic infection. Sole medication regimen of Biktarvy   Subjective:    Chief Complaint  Patient presents with   Follow-up    B20 c/o sore throat, nasal congestion, cough x 1-2 weeks.     HPI:  Kristin Hart is a 43 y.o. female with HIV disease last seen by Rexene Alberts, NP on 02/28/2023 with well-controlled virus and good adherence and tolerance to Biktarvy.  Previous viral load was undetectable with CD4 count 973.  Had been recently diagnosed with type 2 diabetes with hemoglobin A1c of 11.4 and started on metformin and glipizide.  Recently seen in family medicine for rash on left hand with concern for HSV versus infection and treated with valacyclovir and doxycycline.  Here today for routine follow-up.  Kristin Hart has been doing okay since her last office visit and continues to take [facility] as prescribed with no adverse side effects or problems obtaining medication from the pharmacy.  She did miss about 3-4 doses when she was ill recently and has since restarted taking her medication daily.  Housing situation remains a challenge as well as occasional difficulties accessing food.  Transportation is via bus at present.  Working with medical case management and has several needs including housing, requesting a new mattress, and a heater.  Condoms and STD testing offered.  Healthcare maintenance reviewed.  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  Denies fevers, chills, night sweats, headaches, changes in vision, neck  pain/stiffness, nausea, diarrhea, vomiting, lesions or rashes.  Lab Results  Component Value Date   CD4TCELL 61 11/15/2022   CD4TABS 973 11/15/2022   Lab Results  Component Value Date   HIV1RNAQUANT Not Detected 11/15/2022     No Known Allergies    Outpatient Medications Prior to Visit  Medication Sig Dispense Refill   acetaminophen (TYLENOL) 500 MG tablet Take 1 tablet (500 mg total) by mouth every 6 (six) hours as needed. 30 tablet 0   albuterol (VENTOLIN HFA) 108 (90 Base) MCG/ACT inhaler INHALE 1 PUFF INTO THE LUNGS EVERY 4 HOURS AS NEEDED. 18 each 2   Blood Glucose Monitoring Suppl (BLOOD GLUCOSE MONITOR SYSTEM) w/Device KIT 1 Device by Does not apply route daily before breakfast. 1 kit 0   cetirizine (ZYRTEC) 10 MG tablet TAKE 1 TABLET(10 MG) BY MOUTH DAILY 30 tablet 3   cyclobenzaprine (FLEXERIL) 5 MG tablet Take 1 tablet (5 mg total) by mouth at bedtime. (Patient taking differently: Take 5 mg by mouth at bedtime as needed for muscle spasms.) 30 tablet 4   diclofenac Sodium (VOLTAREN) 1 % GEL Apply 4 g topically 4 (four) times daily as needed. 350 g 0   doxycycline (VIBRA-TABS) 100 MG tablet Take 1 tablet (100 mg total) by mouth 2 (two) times daily for 7 days. 14 tablet 0   glipiZIDE (GLUCOTROL XL) 5 MG 24 hr tablet Take 1 tablet (5 mg total) by mouth daily. 90 tablet 3   hydrocortisone cream 1 % Apply to affected area 2 times daily (Patient taking  differently: Apply 1 Application topically 2 (two) times daily as needed for itching.) 15 g 0   losartan-hydrochlorothiazide (HYZAAR) 50-12.5 MG tablet Take 1 tablet by mouth daily. 30 tablet 0   metFORMIN (GLUCOPHAGE-XR) 500 MG 24 hr tablet Take 1 tablet (500 mg total) by mouth daily with breakfast. 90 tablet 3   miconazole (MONISTAT 7) 2 % vaginal cream Place 1 Applicatorful vaginally at bedtime. 45 g 0   mupirocin ointment (BACTROBAN) 2 % Apply 1 Application topically 2 (two) times daily. To affected area till better (Patient  taking differently: Apply 1 Application topically 2 (two) times daily as needed (wound care). To affected area till better) 22 g 0   naproxen (NAPROSYN) 500 MG tablet Take 1 tablet (500 mg total) by mouth 2 (two) times daily with a meal. 60 tablet 2   nystatin cream (MYCOSTATIN) Apply to affected area 2 times daily (Patient taking differently: Apply 1 Application topically 2 (two) times daily as needed for dry skin.) 30 g 0   omeprazole (PRILOSEC) 40 MG capsule TAKE 1 CAPSULE(40 MG) BY MOUTH DAILY 90 capsule 1   rizatriptan (MAXALT) 10 MG tablet Take 0.5-1 tablets (5-10 mg total) by mouth as needed for migraine. May repeat in 2 hours if needed max dose 20 mg/day 10 tablet 1   topiramate (TOPAMAX) 50 MG tablet Take 1 tablet (50 mg total) by mouth daily. 30 tablet 5   valACYclovir (VALTREX) 500 MG tablet TAKE 1 TABLET(500 MG) BY MOUTH TWICE DAILY 60 tablet 2   bictegravir-emtricitabine-tenofovir AF (BIKTARVY) 50-200-25 MG TABS tablet Take 1 tablet by mouth daily. 30 tablet 5   linaclotide (LINZESS) 145 MCG CAPS capsule Take 1 capsule (145 mcg total) by mouth daily before breakfast. (Patient not taking: Reported on 02/28/2023) 90 capsule 1   Pitavastatin Calcium 2 MG TABS Take 1 tablet (2 mg total) by mouth daily. 30 tablet 0   No facility-administered medications prior to visit.     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, per pt   Depression    Diabetes mellitus without complication (HCC)    type 2   Foreign body in small intestine    GERD (gastroesophageal reflux disease)    HIV (human immunodeficiency virus infection) (HCC)    Hypothyroidism    Stomach ulcer    Substance abuse (HCC)    Meth, IV drug use     Past Surgical History:  Procedure Laterality Date   CESAREAN SECTION N/A 07/04/2016   Procedure: CESAREAN SECTION;  Surgeon: [name], DO;  Location: [facility] BIRTHING SUITES;  Service: Obstetrics;  Laterality: N/A;   ENTEROSCOPY N/A 11/08/2017   Procedure: ENTEROSCOPY;  Surgeon: Hilarie Fredrickson, MD;  Location: [facility] ENDOSCOPY;  Service: Endoscopy;  Laterality: N/A;   NO PAST SURGERIES        Review of Systems  Constitutional:  Negative for chills, diaphoresis, fatigue and fever.  HENT:  Positive for congestion.   Respiratory:  Positive for cough. Negative for chest tightness, shortness of breath and wheezing.   Cardiovascular:  Negative for chest pain.  Gastrointestinal:  Negative for abdominal pain, diarrhea, nausea and vomiting.      Objective:    BP (!) 150/95   Pulse (!) 113   Temp 98.3 F (36.8 C) (Temporal)   Resp 16   Wt 160 lb (72.6  kg)   SpO2 99%   BMI 24.69 kg/m  Nursing note and vital signs reviewed.  Physical Exam Constitutional:      General: She is not in acute distress.    Appearance: She is well-developed.  Cardiovascular:     Rate and Rhythm: Normal rate and regular rhythm.     Heart sounds: Normal heart sounds.  Pulmonary:     Effort: Pulmonary effort is normal.     Breath sounds: Normal breath sounds.  Skin:    General: Skin is warm and dry.  Neurological:     Mental Status: She is alert and oriented to person, place, and time.  Psychiatric:        Mood and Affect: Mood normal.         04/29/2023    3:50 PM 03/25/2023    3:26 PM 03/11/2023    4:10 PM 12/11/2022    3:39 PM 11/15/2022    2:36 PM  Depression screen PHQ 2/9  Decreased Interest 0 1 1 0 0  Down, Depressed, Hopeless 1 1 1  0 0  PHQ - 2 Score 1 2 2  0 0  Altered sleeping  1 1    Tired, decreased energy  1 1    Change in appetite  1 1    Feeling bad or failure about yourself   1 1    Trouble concentrating  1 1    Moving slowly or fidgety/restless  0 0    Suicidal thoughts  0 0    PHQ-9 Score  7 7    Difficult doing work/chores   Somewhat difficult         Assessment & Plan:    Patient Active Problem List    Diagnosis Date Noted   Infection of left hand 04/24/2023   Dry skin 03/12/2023   Diarrhea 12/12/2022   Lower abdominal pain 05/28/2022   Post-nasal drip 05/28/2022   Plantar fasciitis, right 03/19/2022   Numbness and tingling in both hands 03/19/2022   Poor social situation 10/25/2021   Axillary abscess 09/20/2021   Therapeutic drug monitoring 08/24/2021   Birth control counseling 12/20/2020   Hypertension 09/21/2020   Grief 08/29/2020   Encounter for surveillance of injectable contraceptive 07/29/2020   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   Screening for cervical cancer 04/21/2020   Screening for STDs (sexually transmitted diseases) 04/21/2020   Costochondritis 02/19/2020   Bilateral hip pain 02/19/2020   Elbow pain, right 12/04/2019   Housing problems 11/23/2019   Mental health disorder 10/12/2019   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   Paresthesias 10/12/2019   Healthcare maintenance 08/05/2019   HIV disease (HCC) 12/31/2018   Anxiety 05/29/2017   Self-mutilation 05/29/2017   Depression 06/07/2016   Marijuana use 06/07/2016   Low vitamin D level 03/15/2016   Type 2 diabetes mellitus with hyperglycemia (HCC) 02/22/2016   Asthma 02/22/2016   ADD (attention deficit disorder) 02/22/2016   Methamphetamine use disorder, severe, 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/22/2016   Hyperlipidemia with target LDL less than 70 01/31/2012   Migraine 01/31/2012   Tremor, 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     Problem List Items Addressed This Visit       Cardiovascular and Mediastinum   Hypertension   Blood pressure elevated at least in part related to methamphetamine usage and likely comorbid condition with type 2 diabetes.  Continue current dose of losartan-hydrochlorothiazide.        Endocrine   Type 2 diabetes mellitus with hyperglycemia (HCC)  Currently managed on metformin and glipizide and awaiting endocrinology referral in early April.  Managed by family medicine.         Other   Methamphetamine use disorder, severe, dependence (HCC)   Kristin Hart social situation remains challenging secondary to continued methamphetamine usage.  Discussed possibility of rehabilitation.  She is not ready for treatment at this time.  Counseling offered.      HIV disease (HCC) - Primary   Kristin Hart continues to have well-controlled virus with good adherence and tolerance to [employer].  Reviewed previous lab work and discussed plan of care and U equals U.  Reminded of importance of taking medication daily as prescribed to reduce risk of progression of disease and complications in the future.  Plan for blood work at next office visit.  Continue current dose of Biktarvy.  Will speak with medical case management.  Plan for follow-up in 1 month or sooner if needed with lab work on the same day.      Relevant Medications   bictegravir-emtricitabine-tenofovir AF (BIKTARVY) 50-200-25 MG TABS tablet   Healthcare maintenance   Discussed importance of safe sexual practice and condom use. Condoms and site specific STD testing offered.  Vaccinations reviewed. Due for dental care and awaiting control of diabetes. Birth control provided and up to date.  Schedule Pap smear with Rexene Alberts, NP when appropriate.       Housing problems   Ms. Narine continues to have housing problems.  Will discuss with medical case management and can refer to [facility] for assistance, however discussed continued methamphetamine use is a barrier to likely obtaining housing.      Poor social situation   Continues to work with medical case management and connecting with resources.  Will discuss needs with medical case manager to optimize connections.      Infection of left hand   Left hand infection appears resolved with completion of valacyclovir and doxycycline.  No additional treatment needed at this time.  Resume prophylactic dose of valacyclovir.      Relevant Medications    bictegravir-emtricitabine-tenofovir AF (BIKTARVY) 50-200-25 MG TABS tablet     I am having Alvan Dame. Mounce maintain her hydrocortisone cream, cyclobenzaprine, acetaminophen, nystatin cream, omeprazole, mupirocin ointment, topiramate, rizatriptan, diclofenac Sodium, naproxen, linaclotide, valACYclovir, miconazole, metFORMIN, glipiZIDE, Blood Glucose Monitor System, losartan-hydrochlorothiazide, Pitavastatin Calcium, cetirizine, albuterol, doxycycline, and Biktarvy.   Meds ordered this encounter  Medications   bictegravir-emtricitabine-tenofovir AF (BIKTARVY) 50-200-25 MG TABS tablet    Sig: Take 1 tablet by mouth daily.    Dispense:  30 tablet    Refill:  5    Supervising Provider:   Judyann Munson [phone]    Prescription Type::   Renewal     Follow-up: Return in about 1 month (around 05/27/2023). or sooner if needed.    Marcos Eke, MSN, FNP-C Nurse Practitioner [facility] for Infectious Disease [facility] Main number: [phone]

## 2023-04-29 NOTE — Assessment & Plan Note (Signed)
Left hand infection appears resolved with completion of valacyclovir and doxycycline.  No additional treatment needed at this time.  Resume prophylactic dose of valacyclovir.

## 2023-04-29 NOTE — Assessment & Plan Note (Signed)
Currently managed on metformin and glipizide and awaiting endocrinology referral in early April.  Managed by family medicine.

## 2023-04-29 NOTE — Assessment & Plan Note (Signed)
Continues to work with medical case management and connecting with resources.  Will discuss needs with medical case manager to optimize connections.

## 2023-04-29 NOTE — Patient Instructions (Addendum)
Nice to see you.  Continue to take your medication daily as prescribed.  Refills have been sent to the pharmacy.  Schedule appointment with Judeth Cornfield.  Plan for follow up in 1 months or sooner if needed with lab work on the same day.  Have a great day and stay safe!

## 2023-04-29 NOTE — Assessment & Plan Note (Signed)
Discussed importance of safe sexual practice and condom use. Condoms and site specific STD testing offered.  Vaccinations reviewed. Due for dental care and awaiting control of diabetes. Birth control provided and up to date.  Schedule Pap smear with Rexene Alberts, NP when appropriate.

## 2023-04-29 NOTE — Assessment & Plan Note (Signed)
Kristin Hart continues to have housing problems.  Will discuss with medical case management and can refer to [facility] for assistance, however discussed continued methamphetamine use is a barrier to likely obtaining housing.

## 2023-04-29 NOTE — Assessment & Plan Note (Signed)
Blood pressure elevated at least in part related to methamphetamine usage and likely comorbid condition with type 2 diabetes.  Continue current dose of losartan-hydrochlorothiazide.

## 2023-04-29 NOTE — Assessment & Plan Note (Signed)
Kristin Hart continues to have well-controlled virus with good adherence and tolerance to Biktarvy.  Reviewed previous lab work and discussed plan of care and U equals U.  Reminded of importance of taking medication daily as prescribed to reduce risk of progression of disease and complications in the future.  Plan for blood work at next office visit.  Continue current dose of Biktarvy.  Will speak with medical case management.  Plan for follow-up in 1 month or sooner if needed with lab work on the same day.

## 2023-04-30 ENCOUNTER — Telehealth: Payer: Self-pay

## 2023-04-30 NOTE — Progress Notes (Signed)
  Medicaid Managed Care   Unsuccessful Attempt Note   04/30/2023 Name: Kristin Hart MRN: [id number] DOB: [birth year]  Referred by: Evette Georges, MD Reason for referral : High Risk Managed Medicaid (Initial outreach for CCM services. )   An unsuccessful telephone outreach was attempted today. The patient was referred to the case management team for assistance with care management and care coordination.    Follow Up Plan: A HIPAA compliant phone message was left for the patient providing contact information and requesting a return call.    [facility]  [facility], [facility] Care Management Assistant Direct Dial: [phone]  Fax: [phone]

## 2023-05-01 ENCOUNTER — Other Ambulatory Visit: Payer: Self-pay | Admitting: *Deleted

## 2023-05-01 NOTE — Patient Outreach (Signed)
Medicaid Managed Care   Nurse Care Manager Note  05/01/2023 Name:  Kristin Hart MRN:  [id number] DOB:  06-03-1980  Kristin Hart is an 43 y.o. year old female who is a primary patient of Mabe, Earvin Hansen, MD.  The [facility] Managed Care Coordination team was consulted for assistance with:    DMII  [name] was given information about Medicaid Managed Care Coordination team services today. Kristin Hart Patient agreed to services and verbal consent obtained.  Engaged with patient by telephone for initial visit in response to provider referral for case management and/or care coordination services.   Patient is participating in a Managed Medicaid Plan:  Yes  Assessments/Interventions:  Review of past medical history, allergies, medications, health status, including review of consultants reports, laboratory and other test data, was performed as part of comprehensive evaluation and provision of chronic care management services.  SDOH (Social Drivers of Health) assessments and interventions performed: SDOH Interventions    Flowsheet Row Patient Outreach from 05/01/2023 in [place] Office Visit from 12/04/2019 in [facility]  SDOH Interventions    Food Insecurity Interventions Other (Comment)  [Referral to BSW] --  Housing Interventions Other (Comment)  [Referral to BSW] --  Transportation Interventions --  [Provided patient with medical transportation provided by [employer] [phone] --  Utilities Interventions Intervention Not Indicated --  Depression Interventions/Treatment  -- Medication       Care Plan  No Known Allergies  Medications Reviewed Today   Medications were not reviewed in this encounter     Patient Active Problem List   Diagnosis Date Noted   Infection of left hand 04/24/2023   Dry skin 03/12/2023   Diarrhea 12/12/2022   Lower abdominal pain 05/28/2022    Post-nasal drip 05/28/2022   Plantar fasciitis, right 03/19/2022   Numbness and tingling in both hands 03/19/2022   Poor social situation 10/25/2021   Axillary abscess 09/20/2021   Therapeutic drug monitoring 08/24/2021   Birth control counseling 12/20/2020   Hypertension 09/21/2020   Grief 08/29/2020   Encounter for surveillance of injectable contraceptive 07/29/2020   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   Screening for cervical cancer 04/21/2020   Screening for STDs (sexually transmitted diseases) 04/21/2020   Costochondritis 02/19/2020   Bilateral hip pain 02/19/2020   Elbow pain, right 12/04/2019   Housing problems 11/23/2019   Mental health disorder 10/12/2019   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   Paresthesias 10/12/2019   Healthcare maintenance 08/05/2019   HIV disease (HCC) 12/31/2018   Anxiety 05/29/2017   Self-mutilation 05/29/2017   Depression 06/07/2016   Marijuana use 06/07/2016   Low vitamin D level 03/15/2016   Type 2 diabetes mellitus with hyperglycemia (HCC) 02/22/2016   Asthma 02/22/2016   ADD (attention deficit disorder) 02/22/2016   Methamphetamine use disorder, severe, 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/22/2016   Hyperlipidemia with target LDL less than 70 01/31/2012   Migraine 01/31/2012   Tremor, 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    Conditions to be addressed/monitored per PCP order:  DMII  Care Plan : RN Care Manager Plan of Care  Updates made by Heidi Dach, RN since 05/01/2023 12:00 AM     Problem: Health Management needs related to DM      Long-Range Goal: Development of Plan of Care to address Health Management needs related to DM   Start Date: 05/01/2023  Expected End Date: 07/30/2023  Note:  Current Barriers:  Chronic Disease Management support and education needs related to DMII   RNCM Clinical Goal(s):  Patient will verbalize understanding of plan for management of DMII as evidenced by Patient reports take all medications exactly as  prescribed and will call provider for medication related questions as evidenced by Patient reports attend all scheduled medical appointments: 05/22/23 with [facility], 05/27/23 with PCP and 06/11/23 with Endocrinology as evidenced by provider documentation in EMR continue to work with RN Care Manager to address care management and care coordination needs related to  DMII as evidenced by adherence to CM Team Scheduled appointments work with Child psychotherapist to address  related to the management of Financial constraints related to food, housing and utilities related to the management of DMII as evidenced by review of EMR and patient or Child psychotherapist report through collaboration with Medical illustrator, provider, and care team.   Interventions: Evaluation of current treatment plan related to  self management and patient's adherence to plan as established by provider   Diabetes Interventions:  (Status:  New goal.) Long Term Goal Assessed patient's understanding of A1c goal: <7% Provided education to patient about basic DM disease process Reviewed medications with patient and discussed importance of medication adherence Counseled on importance of regular laboratory monitoring as prescribed Discussed plans with patient for ongoing care management follow up and provided patient with direct contact information for care management team Reviewed scheduled/upcoming provider appointments including: [facility] on 05/22/23, PCP on 05/27/23 and Endocrinology on 06/11/23 Referral made to social work team for assistance with SDOH needs-housing, utilities and food Review of patient status, including review of consultants reports, relevant laboratory and other test results, and medications completed Assessed social determinant of health barriers Discussed member benefits provided by [facility], advised to contact member services [phone] to inquire about glucometer, fresh fruits and vegetables and smart phone Provided patient with  contact information regarding medical transportation provided by [employer] [phone] Educated patient on foods to avoid, discussed MyPlate  Provided patient with [facility] [phone] and [facility] [address] [phone] or may find other providers on the [facility] website, advised patient to call for an eye exam  Lab Results  Component Value Date   HGBA1C 11.2 (A) 03/25/2023    Patient Goals/Self-Care Activities: Take all medications as prescribed Attend all scheduled provider appointments Call provider office for new concerns or questions  Work with the social worker to address care coordination needs and will continue to work with the clinical team to address health care and disease management related needs Contact [employer] member services for member benefits schedule appointment with eye doctor drink 6 to 8 glasses of water each day fill half of plate with vegetables manage portion size switch to sugar-free drinks Obtain a glucometer  Follow Up Plan:  Telephone follow up appointment with care management team member scheduled for:  05/03/23 at 2:30pm      Follow Up:  Patient agrees to Care Plan and Follow-up.  Plan: The Managed Medicaid care management team will reach out to the patient again over the next 2 days.  Date/time of next scheduled RN care management/care coordination outreach:  05/03/23 at 2:30pm  Estanislado Emms RN, BSN [place]  [facility] RN Care Manager [phone]

## 2023-05-01 NOTE — Patient Instructions (Signed)
Visit Information  Ms. Kristin Hart was given information about Medicaid Managed Care team care coordination services as a part of their Healthy [facility] Medicaid benefit. Kristin Hart verbally consented to engagement with the [facility] Managed Care team.   If you are experiencing a medical emergency, please call 911 or report to your local emergency department or urgent care.   If you have a non-emergency medical problem during routine business hours, please contact your provider's office and ask to speak with a nurse.   For questions related to your Healthy [facility] health plan, please call: [phone] or visit the homepage here: [email or web address]  If you would like to schedule transportation through your Healthy [facility] plan, please call the following number at least 2 days in advance of your appointment: [phone]  For information about your ride after you set it up, call Ride Assist at [phone]. Use this number to activate a Will Call pickup, or if your transportation is late for a scheduled pickup. Use this number, too, if you need to make a change or cancel a previously scheduled reservation.  If you need transportation services right away, call [phone]. The after-hours call center is staffed 24 hours to handle ride assistance and urgent reservation requests (including discharges) 365 days a year. Urgent trips include sick visits, hospital discharge requests and life-sustaining treatment.  Call the [facility] Line at [phone], at any time, 24 hours a day, 7 days a week. If you are in danger or need immediate medical attention call 911.  If you would like help to quit smoking, call 1-800-QUIT-NOW (215 530 4510) OR Espaol: 1-855-Djelo-Ya (0-932-355-7322) o para ms informacin haga clic aqu or Text READY to 025-427 to register via text  Kristin Hart,  Thank you for talking with me today. I look forward to our  telephone visit on Friday. I will be right here with you as you learn to manage your health.   Please see education materials related to diabetes provided by MyChart link.  Patient verbalizes understanding of instructions and care plan provided today and agrees to view in MyChart. Active MyChart status and patient understanding of how to access instructions and care plan via MyChart confirmed with patient.     Telephone follow up appointment with Managed Medicaid care management team member scheduled for:05/03/23 at 2:30pm  Estanislado Emms RN, BSN [place]  Value-Based Care [facility] RN Care Manager [phone]   Following is a copy of your plan of care:  Care Plan : RN Care Manager Plan of Care  Updates made by Heidi Dach, RN since 05/01/2023 12:00 AM     Problem: Health Management needs related to DM      Long-Range Goal: Development of Plan of Care to address Health Management needs related to DM   Start Date: 05/01/2023  Expected End Date: 07/30/2023  Note:   Current Barriers:  Chronic Disease Management support and education needs related to DMII   RNCM Clinical Goal(s):  Patient will verbalize understanding of plan for management of DMII as evidenced by Patient reports take all medications exactly as prescribed and will call provider for medication related questions as evidenced by Patient reports attend all scheduled medical appointments: 05/22/23 with [facility], 05/27/23 with PCP and 06/11/23 with Endocrinology as evidenced by provider documentation in EMR continue to work with RN Care Manager to address care management and care coordination needs related to  DMII as evidenced by adherence to CM Team Scheduled appointments work with Child psychotherapist to  address  related to the management of Financial constraints related to food, housing and utilities related to the management of DMII as evidenced by review of EMR and patient or social worker report through  collaboration with Medical illustrator, provider, and care team.   Interventions: Evaluation of current treatment plan related to  self management and patient's adherence to plan as established by provider   Diabetes Interventions:  (Status:  New goal.) Long Term Goal Assessed patient's understanding of A1c goal: <7% Provided education to patient about basic DM disease process Reviewed medications with patient and discussed importance of medication adherence Counseled on importance of regular laboratory monitoring as prescribed Discussed plans with patient for ongoing care management follow up and provided patient with direct contact information for care management team Reviewed scheduled/upcoming provider appointments including: [facility] on 05/22/23, PCP on 05/27/23 and Endocrinology on 06/11/23 Referral made to social work team for assistance with SDOH needs-housing, utilities and food Review of patient status, including review of consultants reports, relevant laboratory and other test results, and medications completed Assessed social determinant of health barriers Discussed member benefits provided by [facility], advised to contact member services [phone] to inquire about glucometer, fresh fruits and vegetables and smart phone Provided patient with contact information regarding medical transportation provided by [employer] [phone] Educated patient on foods to avoid, discussed MyPlate  Provided patient with [facility] [phone] and [facility] [address] [phone] or may find other providers on the [facility] website, advised patient to call for an eye exam  Lab Results  Component Value Date   HGBA1C 11.2 (A) 03/25/2023    Patient Goals/Self-Care Activities: Take all medications as prescribed Attend all scheduled provider appointments Call provider office for new concerns or questions  Work with the social worker to address care coordination needs and will continue to work  with the clinical team to address health care and disease management related needs Contact [employer] member services for member benefits schedule appointment with eye doctor drink 6 to 8 glasses of water each day fill half of plate with vegetables manage portion size switch to sugar-free drinks Obtain a glucometer  Follow Up Plan:  Telephone follow up appointment with care management team member scheduled for:  05/03/23 at 2:30pm

## 2023-05-03 ENCOUNTER — Encounter: Payer: Self-pay | Admitting: Family Medicine

## 2023-05-03 ENCOUNTER — Other Ambulatory Visit: Payer: Self-pay | Admitting: *Deleted

## 2023-05-03 NOTE — Patient Instructions (Signed)
Visit Information  Ms. Kristin Hart was given information about Medicaid Managed Care team care coordination services as a part of their Healthy [facility] Medicaid benefit. Kristin Hart verbally consented to engagement with the [facility] Managed Care team.   If you are experiencing a medical emergency, please call 911 or report to your local emergency department or urgent care.   If you have a non-emergency medical problem during routine business hours, please contact your provider's office and ask to speak with a nurse.   For questions related to your Healthy [facility] health plan, please call: [phone] or visit the homepage here: [email or web address]  If you would like to schedule transportation through your Healthy [facility] plan, please call the following number at least 2 days in advance of your appointment: [phone]  For information about your ride after you set it up, call Ride Assist at [phone]. Use this number to activate a Will Call pickup, or if your transportation is late for a scheduled pickup. Use this number, too, if you need to make a change or cancel a previously scheduled reservation.  If you need transportation services right away, call [phone]. The after-hours call center is staffed 24 hours to handle ride assistance and urgent reservation requests (including discharges) 365 days a year. Urgent trips include sick visits, hospital discharge requests and life-sustaining treatment.  Call the [facility] Line at [phone], at any time, 24 hours a day, 7 days a week. If you are in danger or need immediate medical attention call 911.  If you would like help to quit smoking, call 1-800-QUIT-NOW (640-182-5753) OR Espaol: 1-855-Djelo-Ya (4-742-595-6387) o para ms informacin haga clic aqu or Text READY to 564-332 to register via text  Kristin Hart,   Please see education materials related to diabetes and sleep  provided by MyChart link.  Patient verbalizes understanding of instructions and care plan provided today and agrees to view in MyChart. Active MyChart status and patient understanding of how to access instructions and care plan via MyChart confirmed with patient.     Telephone follow up appointment with Managed Medicaid care management team member scheduled for:  Estanislado Emms RN, BSN [place]  Value-Based Care [facility] RN Care Manager [phone]   Following is a copy of your plan of care:  Care Plan : RN Care Manager Plan of Care  Updates made by Heidi Dach, RN since 05/03/2023 12:00 AM     Problem: Health Management needs related to DM      Long-Range Goal: Development of Plan of Care to address Health Management needs related to DM   Start Date: 05/01/2023  Expected End Date: 07/30/2023  Note:   Current Barriers:  Chronic Disease Management support and education needs related to DMII   RNCM Clinical Goal(s):  Patient will verbalize understanding of plan for management of DMII as evidenced by Patient reports take all medications exactly as prescribed and will call provider for medication related questions as evidenced by Patient reports attend all scheduled medical appointments: 05/22/23 with [facility], 05/27/23 with PCP and 06/11/23 with Endocrinology as evidenced by provider documentation in EMR continue to work with RN Care Manager to address care management and care coordination needs related to  DMII as evidenced by adherence to CM Team Scheduled appointments work with social worker to address  related to the management of Financial constraints related to food, housing and utilities related to the management of DMII as evidenced by review of EMR and patient or social  worker report through collaboration with Medical illustrator, provider, and care team.   Interventions: Evaluation of current treatment plan related to  self management and patient's adherence to  plan as established by provider   Diabetes Interventions:  (Status:  New goal.) Long Term Goal Assessed patient's understanding of A1c goal: <7% Provided education to patient about basic DM disease process Reviewed medications with patient and discussed importance of medication adherence Counseled on importance of regular laboratory monitoring as prescribed Discussed plans with patient for ongoing care management follow up and provided patient with direct contact information for care management team Reviewed scheduled/upcoming provider appointments including: [facility] on 05/22/23, PCP on 05/27/23, [facility] on 05/29/23 and Endocrinology on 06/11/23 Referral made to social work team for assistance with SDOH needs-housing, utilities and food Review of patient status, including review of consultants reports, relevant laboratory and other test results, and medications completed Assessed social determinant of health barriers Discussed member benefits provided by [facility], advised to contact member services [phone] to inquire about glucometer, fresh fruits and vegetables and smart phone Provided patient with contact information regarding medical transportation provided by [employer] [phone] Educated patient on foods to avoid, discussed MyPlate  Provided patient with [facility] [phone] and [facility] [address] [phone] or may find other providers on the [facility] website, advised patient to call for an eye exam Contacted [facility] to inquire about prescribed glucometer-currently prescribed glucometer will cost patient $14.99 out of pocket Advised patient to reach out to [facility] to inquire about glucometer covered by plan Discussed sleep hygiene and the importance of sleeping 6-8 hours each night  Lab Results  Component Value Date   HGBA1C 11.2 (A) 03/25/2023    Patient Goals/Self-Care Activities: Take all medications as prescribed Attend all scheduled provider  appointments Call provider office for new concerns or questions  Work with the social worker to address care coordination needs and will continue to work with the clinical team to address health care and disease management related needs Contact [employer] member services for member benefits schedule appointment with eye doctor drink 6 to 8 glasses of water each day fill half of plate with vegetables manage portion size switch to sugar-free drinks Obtain a glucometer  Follow Up Plan:  Telephone follow up appointment with care management team member scheduled for:  05/17/23 at 3pm

## 2023-05-03 NOTE — Patient Outreach (Signed)
Medicaid Managed Care   Nurse Care Manager Note  05/03/2023 Name:  Kristin Hart MRN:  [id number] DOB:  [birth year]  Kristin Hart is an 43 y.o. year old female who is a primary patient of Mabe, Earvin Hansen, MD.  The [facility] Managed Care Coordination team was consulted for assistance with:    DMII  Ms. Craun was given information about Medicaid Managed Care Coordination team services today. Kristin Hart Patient agreed to services and verbal consent obtained.  Engaged with patient by telephone for follow up visit in response to provider referral for case management and/or care coordination services.   Patient is participating in a Managed Medicaid Plan:  Yes  Assessments/Interventions:  Review of past medical history, allergies, medications, health status, including review of consultants reports, laboratory and other test data, was performed as part of comprehensive evaluation and provision of chronic care management services.  SDOH (Social Drivers of Health) assessments and interventions performed: SDOH Interventions    Flowsheet Row Patient Outreach from 05/01/2023 in [place] POPULATION HEALTH DEPARTMENT Office Visit from 12/04/2019 in [facility] Of [place]  SDOH Interventions    Food Insecurity Interventions Other (Comment)  [Referral to BSW] --  Housing Interventions Other (Comment)  [Referral to BSW] --  Transportation Interventions --  [Provided patient with medical transportation provided by [employer] [phone] --  Utilities Interventions Intervention Not Indicated --  Depression Interventions/Treatment  -- Medication       Care Plan  No Known Allergies  Medications Reviewed Today     Reviewed by Heidi Dach, RN (Registered Nurse) on 05/03/23 at 1503  Med List Status: <None>   Medication Order Taking? Sig Documenting Provider Last Dose Status Informant  acetaminophen (TYLENOL) 500 MG tablet [id number] Yes  Take 1 tablet (500 mg total) by mouth every 6 (six) hours as needed. Debby Freiberg, NP Taking Active Self           Med Note Lyndal Rainbow Jun 17, 2022  3:20 PM) prn  albuterol (VENTOLIN HFA) 108 (90 Base) MCG/ACT inhaler [id number] Yes INHALE 1 PUFF INTO THE LUNGS EVERY 4 HOURS AS NEEDED. Veryl Speak, FNP Taking Active   bictegravir-emtricitabine-tenofovir AF (BIKTARVY) 50-200-25 MG TABS tablet [id number] Yes Take 1 tablet by mouth daily. Veryl Speak, FNP Taking Active   Blood Glucose Monitoring Suppl (BLOOD GLUCOSE MONITOR SYSTEM) w/Device KIT [id number] No 1 Device by Does not apply route daily before breakfast.  Patient not taking: Reported on 05/03/2023   Evette Georges, MD Not Taking Active   cetirizine (ZYRTEC) 10 MG tablet [id number] Yes TAKE 1 TABLET(10 MG) BY MOUTH DAILY Veryl Speak, FNP Taking Active   cyclobenzaprine (FLEXERIL) 5 MG tablet [id number] Yes Take 1 tablet (5 mg total) by mouth at bedtime.  Patient taking differently: Take 5 mg by mouth at bedtime as needed for muscle spasms.   Veryl Speak, FNP Taking Active Self           Med Note Gunnar Fusi, MELISSA R   Fri Dec 07, 2022  4:41 PM)    diclofenac Sodium (VOLTAREN) 1 % GEL [id number] Yes Apply 4 g topically 4 (four) times daily as needed. Veryl Speak, FNP Taking Active Self  glipiZIDE (GLUCOTROL XL) 5 MG 24 hr tablet [id number] Yes Take 1 tablet (5 mg total) by mouth daily. Evette Georges, MD Taking Active   hydrocortisone cream 1 % [id number] Yes Apply  to affected area 2 times daily  Patient taking differently: Apply 1 Application topically 2 (two) times daily as needed for itching.   Henderly, Britni A, PA-C Taking Active Self  linaclotide (LINZESS) 145 MCG CAPS capsule [id number] No Take 1 capsule (145 mcg total) by mouth daily before breakfast.  Patient not taking: Reported on 02/28/2023   Veryl Speak, FNP Not Taking Active Self  losartan-hydrochlorothiazide (HYZAAR) 50-12.5 MG tablet  [id number] No Take 1 tablet by mouth daily.  Patient not taking: Reported on 05/03/2023   Evette Georges, MD Not Taking Active   metFORMIN (GLUCOPHAGE-XR) 500 MG 24 hr tablet [id number] Yes Take 1 tablet (500 mg total) by mouth daily with breakfast. Evette Georges, MD Taking Active   miconazole (MONISTAT 7) 2 % vaginal cream [id number] No Place 1 Applicatorful vaginally at bedtime.  Patient not taking: Reported on 05/03/2023   Veryl Speak, FNP Not Taking Active   mupirocin ointment (BACTROBAN) 2 % [id number] Yes Apply 1 Application topically 2 (two) times daily. To affected area till better  Patient taking differently: Apply 1 Application topically 2 (two) times daily as needed (wound care). To affected area till better   Veryl Speak, FNP Taking Active Self  naproxen (NAPROSYN) 500 MG tablet [id number] Yes Take 1 tablet (500 mg total) by mouth 2 (two) times daily with a meal. Veryl Speak, FNP Taking Active Self  nystatin cream (MYCOSTATIN) [id number] Yes Apply to affected area 2 times daily  Patient taking differently: Apply 1 Application topically 2 (two) times daily as needed for dry skin.   Zenia Resides, MD Taking Active Self  omeprazole (PRILOSEC) 40 MG capsule [id number] Yes TAKE 1 CAPSULE(40 MG) BY MOUTH DAILY Veryl Speak, FNP Taking Active Self  Pitavastatin Calcium 2 MG TABS [id number]  Take 1 tablet (2 mg total) by mouth daily. Evette Georges, MD  Expired 04/24/23 2359   rizatriptan (MAXALT) 10 MG tablet [id number] Yes Take 0.5-1 tablets (5-10 mg total) by mouth as needed for migraine. May repeat in 2 hours if needed max dose 20 mg/day Veryl Speak, FNP Taking Active Self  topiramate (TOPAMAX) 50 MG tablet [id number] Yes Take 1 tablet (50 mg total) by mouth daily. Veryl Speak, FNP Taking Active Self  valACYclovir (VALTREX) 500 MG tablet [id number] Yes TAKE 1 TABLET(500 MG) BY MOUTH TWICE DAILY Veryl Speak, FNP Taking Active             Patient Active  Problem List   Diagnosis Date Noted   Infection of left hand 04/24/2023   Dry skin 03/12/2023   Diarrhea 12/12/2022   Lower abdominal pain 05/28/2022   Post-nasal drip 05/28/2022   Plantar fasciitis, right 03/19/2022   Numbness and tingling in both hands 03/19/2022   Poor social situation 10/25/2021   Axillary abscess 09/20/2021   Therapeutic drug monitoring 08/24/2021   Birth control counseling 12/20/2020   Hypertension 09/21/2020   Grief 08/29/2020   Encounter for surveillance of injectable contraceptive 07/29/2020   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   Screening for cervical cancer 04/21/2020   Screening for STDs (sexually transmitted diseases) 04/21/2020   Costochondritis 02/19/2020   Bilateral hip pain 02/19/2020   Elbow pain, right 12/04/2019   Housing problems 11/23/2019   Mental health disorder 10/12/2019   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   Paresthesias 10/12/2019   Healthcare maintenance 08/05/2019   HIV disease (HCC) 12/31/2018   Anxiety 05/29/2017   Self-mutilation 05/29/2017  Depression 06/07/2016   Marijuana use 06/07/2016   Low vitamin D level 03/15/2016   Type 2 diabetes mellitus with hyperglycemia (HCC) 02/22/2016   Asthma 02/22/2016   ADD (attention deficit disorder) 02/22/2016   Methamphetamine use disorder, severe, 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/22/2016   Hyperlipidemia with target LDL less than 70 01/31/2012   Migraine 01/31/2012   Tremor, 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    Conditions to be addressed/monitored per PCP order:  DMII  Care Plan : RN Care Manager Plan of Care  Updates made by Heidi Dach, RN since 05/03/2023 12:00 AM     Problem: Health Management needs related to DM      Long-Range Goal: Development of Plan of Care to address Health Management needs related to DM   Start Date: 05/01/2023  Expected End Date: 07/30/2023  Note:   Current Barriers:  Chronic Disease Management support and education needs related to DMII    RNCM Clinical Goal(s):  Patient will verbalize understanding of plan for management of DMII as evidenced by Patient reports take all medications exactly as prescribed and will call provider for medication related questions as evidenced by Patient reports attend all scheduled medical appointments: 05/22/23 with [facility], 05/27/23 with PCP and 06/11/23 with Endocrinology as evidenced by provider documentation in EMR continue to work with RN Care Manager to address care management and care coordination needs related to  DMII as evidenced by adherence to CM Team Scheduled appointments work with Child psychotherapist to address  related to the management of Financial constraints related to food, housing and utilities related to the management of DMII as evidenced by review of EMR and patient or Child psychotherapist report through collaboration with Medical illustrator, provider, and care team.   Interventions: Evaluation of current treatment plan related to  self management and patient's adherence to plan as established by provider   Diabetes Interventions:  (Status:  New goal.) Long Term Goal Assessed patient's understanding of A1c goal: <7% Provided education to patient about basic DM disease process Reviewed medications with patient and discussed importance of medication adherence Counseled on importance of regular laboratory monitoring as prescribed Discussed plans with patient for ongoing care management follow up and provided patient with direct contact information for care management team Reviewed scheduled/upcoming provider appointments including: [facility] on 05/22/23, PCP on 05/27/23, [facility] on 05/29/23 and Endocrinology on 06/11/23 Referral made to social work team for assistance with SDOH needs-housing, utilities and food Review of patient status, including review of consultants reports, relevant laboratory and other test results, and medications completed Assessed social determinant of health barriers Discussed member  benefits provided by [facility], advised to contact member services [phone] to inquire about glucometer, fresh fruits and vegetables and smart phone Provided patient with contact information regarding medical transportation provided by [employer] [phone] Educated patient on foods to avoid, discussed MyPlate  Provided patient with [facility] [phone] and [facility] [address] [phone] or may find other providers on the [facility] website, advised patient to call for an eye exam Contacted [facility] to inquire about prescribed glucometer-currently prescribed glucometer will cost patient $14.99 out of pocket Advised patient to reach out to [facility] to inquire about glucometer covered by plan Discussed sleep hygiene and the importance of sleeping 6-8 hours each night  Lab Results  Component Value Date   HGBA1C 11.2 (A) 03/25/2023    Patient Goals/Self-Care Activities: Take all medications as prescribed Attend all scheduled provider appointments Call provider office for new concerns or questions  Work with  the social worker to address care coordination needs and will continue to work with the clinical team to address health care and disease management related needs Contact [employer] member services for member benefits schedule appointment with eye doctor drink 6 to 8 glasses of water each day fill half of plate with vegetables manage portion size switch to sugar-free drinks Obtain a glucometer  Follow Up Plan:  Telephone follow up appointment with care management team member scheduled for:  05/17/23 at 3pm      Follow Up:  Patient agrees to Care Plan and Follow-up.  Plan: The Managed Medicaid care management team will reach out to the patient again over the next 14 days.  Date/time of next scheduled RN care management/care coordination outreach:  05/17/23 at 3pm  Estanislado Emms RN, BSN [place]  Value-Based Care Institute [facility] RN  Care Manager [phone]

## 2023-05-09 ENCOUNTER — Other Ambulatory Visit: Payer: Self-pay

## 2023-05-09 NOTE — Patient Outreach (Signed)
 Medicaid Managed Care Social Work Note  05/09/2023 Name:  Kristin Hart MRN:  [id number] DOB:  1980/10/22  Kristin Hart is an 43 y.o. year old female who is a primary patient of Mabe, Earvin Hansen, MD.  The Medicaid Managed Care Coordination team was consulted for assistance with:  Community Resources   [name] was given information about Medicaid Managed Care Coordination team services today. [name] Patient agreed to services and verbal consent obtained.  Engaged with patient  for by telephone forinitial visit in response to referral for case management and/or care coordination services.   Patient is participating in a Managed Medicaid Plan:  Yes  Assessments/Interventions:  Review of past medical history, allergies, medications, health status, including review of consultants reports, laboratory and other test data, was performed as part of comprehensive evaluation and provision of chronic care management services.  SDOH: (Social Drivers of Health) assessments and interventions performed: SDOH Interventions    Flowsheet Row Patient Outreach from 05/01/2023 in [place] [facility] POPULATION HEALTH DEPARTMENT Office Visit from 12/04/2019 in [facility] - [facility] Dept Of [place]. [facility]  SDOH Interventions    Food Insecurity Interventions Other (Comment)  [Referral to BSW] --  Housing Interventions Other (Comment)  [Referral to BSW] --  Transportation Interventions --  [Provided patient with medical transportation provided by [employer] [phone] --  Utilities Interventions Intervention Not Indicated --  Depression Interventions/Treatment  -- Medication      BSW completed a telephone outreach with patient, she states she is not working and does not have any income, she is currently needing assistance with food rent and utilities. Patient states she does receive foodstamps. Her rent is 250. BSW and patient agreed for resources to be mailed to address on  file.  Advanced Directives Status:  Not addressed in this encounter.  Care Plan                 No Known Allergies  Medications Reviewed Today   Medications were not reviewed in this encounter     Patient Active Problem List   Diagnosis Date Noted   Infection of left hand 04/24/2023   Dry skin 03/12/2023   Diarrhea 12/12/2022   Lower abdominal pain 05/28/2022   Post-nasal drip 05/28/2022   Plantar fasciitis, right 03/19/2022   Numbness and tingling in both hands 03/19/2022   Poor social situation 10/25/2021   Axillary abscess 09/20/2021   Therapeutic drug monitoring 08/24/2021   Birth control counseling 12/20/2020   Hypertension 09/21/2020   Grief 08/29/2020   Encounter for surveillance of injectable contraceptive 07/29/2020   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   Screening for cervical cancer 04/21/2020   Screening for STDs (sexually transmitted diseases) 04/21/2020   Costochondritis 02/19/2020   Bilateral hip pain 02/19/2020   Elbow pain, right 12/04/2019   Housing problems 11/23/2019   Mental health disorder 10/12/2019   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   Paresthesias 10/12/2019   Healthcare maintenance 08/05/2019   HIV disease (HCC) 12/31/2018   Anxiety 05/29/2017   Self-mutilation 05/29/2017   Depression 06/07/2016   Marijuana use 06/07/2016   Low vitamin D level 03/15/2016   Type 2 diabetes mellitus with hyperglycemia (HCC) 02/22/2016   Asthma 02/22/2016   ADD (attention deficit disorder) 02/22/2016   Methamphetamine use disorder, severe, 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/22/2016   Hyperlipidemia with target LDL less than 70 01/31/2012   Migraine 01/31/2012   Tremor, 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  Conditions to be addressed/monitored per PCP order:   community resources  There are no care plans that you recently modified to display for this patient.   Follow up:  Patient agrees to Care Plan and Follow-up.  Plan: The Managed Medicaid care  management team will reach out to the patient again over the next 30 days.  Date/time of next scheduled Social Work care management/care coordination outreach:  06/12/23  Gus Puma, Kenard Gower, [facility] Health  Managed [facility] Social Worker [phone]

## 2023-05-09 NOTE — Patient Instructions (Signed)
 Visit Information  Ms. Hascall was given information about Medicaid Managed Care team care coordination services as a part of their Healthy [facility] Medicaid benefit. [name] verbally consented to engagement with the [facility] Managed Care team.   If you are experiencing a medical emergency, please call 911 or report to your local emergency department or urgent care.   If you have a non-emergency medical problem during routine business hours, please contact your provider's office and ask to speak with a nurse.   For questions related to your Healthy [facility] health plan, please call: [phone] or visit the homepage here: [email or web address]  If you would like to schedule transportation through your Healthy [facility] plan, please call the following number at least 2 days in advance of your appointment: [phone]  For information about your ride after you set it up, call Ride Assist at [phone]. Use this number to activate a Will Call pickup, or if your transportation is late for a scheduled pickup. Use this number, too, if you need to make a change or cancel a previously scheduled reservation.  If you need transportation services right away, call [phone]. The after-hours call center is staffed 24 hours to handle ride assistance and urgent reservation requests (including discharges) 365 days a year. Urgent trips include sick visits, hospital discharge requests and life-sustaining treatment.  Call the [facility] Line at [phone], at any time, 24 hours a day, 7 days a week. If you are in danger or need immediate medical attention call 911.  If you would like help to quit smoking, call 1-800-QUIT-NOW ((301) 021-1361) OR Espaol: 1-855-Djelo-Ya (8-756-433-2951) o para ms informacin haga clic aqu or Text READY to 884-166 to register via text  Ms. Current - following are the goals we discussed in your visit today:   Goals  Addressed   None     Social Worker will follow up in 30 days.   Gus Puma, Kenard Gower, MHA [facility] Health  Managed Medicaid Social Worker [id number]   Following is a copy of your plan of care:  There are no care plans that you recently modified to display for this patient.

## 2023-05-17 ENCOUNTER — Other Ambulatory Visit: Payer: Self-pay | Admitting: *Deleted

## 2023-05-17 NOTE — Patient Outreach (Signed)
 Care Coordination  05/17/2023  Kristin Hart April 15, 1980 [id number]   Successful telephone outreach with [name]. However, she is sick with laryngitis and unable to keep this telephone visit. Patient request to reschedule. A new telephone appointment was made on 05/28/23 at 3:15pm. Patient agreed to new date and time.  Estanislado Emms RN, BSN [place]  Value-Based Care Institute [facility] Health RN Care Manager [phone]

## 2023-05-22 ENCOUNTER — Other Ambulatory Visit (HOSPITAL_COMMUNITY)
Admission: RE | Admit: 2023-05-22 | Discharge: 2023-05-22 | Disposition: A | Discharge status: Home / Self Care | Source: Ambulatory Visit | Attending: Infectious Diseases | Admitting: Infectious Diseases

## 2023-05-22 ENCOUNTER — Ambulatory Visit: Payer: Medicaid Other | Admitting: Infectious Diseases

## 2023-05-22 ENCOUNTER — Other Ambulatory Visit: Payer: Self-pay

## 2023-05-22 DIAGNOSIS — B3731 Acute candidiasis of vulva and vagina: Secondary | ICD-10-CM | POA: Diagnosis not present

## 2023-05-22 DIAGNOSIS — Z124 Encounter for screening for malignant neoplasm of cervix: Secondary | ICD-10-CM

## 2023-05-22 DIAGNOSIS — K219 Gastro-esophageal reflux disease without esophagitis: Secondary | ICD-10-CM

## 2023-05-22 DIAGNOSIS — L03012 Cellulitis of left finger: Secondary | ICD-10-CM

## 2023-05-22 MED ORDER — AMOXICILLIN-POT CLAVULANATE 875-125 MG PO TABS: 1.0000 | ORAL_TABLET | Freq: Two times a day (BID) | ORAL | 0 refills | Status: DC

## 2023-05-22 MED ORDER — BENZONATATE 100 MG PO CAPS: 100.0000 mg | ORAL_CAPSULE | Freq: Four times a day (QID) | ORAL | 0 refills | Status: DC | PRN

## 2023-05-22 MED ORDER — FLUCONAZOLE 150 MG PO TABS: 150.0000 mg | ORAL_TABLET | ORAL | 0 refills | Status: DC

## 2023-05-22 NOTE — Progress Notes (Signed)
 Brief Narrative   Patient ID: Kristin Hart, female    DOB: 1981-01-26, 43 y.o.   MRN: [id number]  Kristin Hart is a 43 y/o caucasian female diagnosed with HIV disease on 01/02/19 with risk factor of heterosexual contact and IV drug use. Initial viral load on 06/23/19 23,300 and CD4 count 577. Genotype with no significant drug resistance. Entered care at [facility] Stage 1. [id number] negative. No history of opportunistic infection. Sole medication regimen of Biktarvy   Subjective:    Chief Complaint  Patient presents with   Gynecologic Exam    Discussed the use of AI scribe software for clinical note transcription with the patient, who gave verbal consent to proceed.  History of Present Illness   Kristin Hart is here for routine pap smear. She also presents with laryngitis and a painful finger infection.  She has a painful finger infection that has persisted for almost a week. The affected area is very wet, covered with a blister, and difficult to bandage. The blister has been draining clear pus, and she experiences significant pain. She has not taken Augmentin before but has taken amoxicillin without issues.  She has been experiencing laryngitis for about a week and frequently suffers from this condition. She believes her voice loss could be related to a viral infection or allergies, as she always has allergies. No fever or chills are present, but she reports itchy eyes and a draining nose.  No vaginal discharge, itching, drainage, or lesions. She was the last in her household to get the flu and is still recovering. She reports muffled hearing in one ear but can hear okay overall. Has a persistent cough that is keeping her up at night.      Everyone had "flu" in her house and she was the last to get it. Has been a few weeks now (almost a month) and still has difficulty speaking.   Depo received 04/18/23    Lab Results  Component Value Date   CD4TCELL 61 11/15/2022   CD4TABS 973 11/15/2022    Lab Results  Component Value Date   HIV1RNAQUANT Not Detected 11/15/2022     No Known Allergies    Outpatient Medications Prior to Visit  Medication Sig Dispense Refill   acetaminophen (TYLENOL) 500 MG tablet Take 1 tablet (500 mg total) by mouth every 6 (six) hours as needed. 30 tablet 0   albuterol (VENTOLIN HFA) 108 (90 Base) MCG/ACT inhaler INHALE 1 PUFF INTO THE LUNGS EVERY 4 HOURS AS NEEDED. 18 each 2   bictegravir-emtricitabine-tenofovir AF (BIKTARVY) 50-200-25 MG TABS tablet Take 1 tablet by mouth daily. 30 tablet 5   Blood Glucose Monitoring Suppl (BLOOD GLUCOSE MONITOR SYSTEM) w/Device KIT 1 Device by Does not apply route daily before breakfast. (Patient not taking: Reported on 05/03/2023) 1 kit 0   cetirizine (ZYRTEC) 10 MG tablet TAKE 1 TABLET(10 MG) BY MOUTH DAILY 30 tablet 3   cyclobenzaprine (FLEXERIL) 5 MG tablet Take 1 tablet (5 mg total) by mouth at bedtime. (Patient taking differently: Take 5 mg by mouth at bedtime as needed for muscle spasms.) 30 tablet 4   diclofenac Sodium (VOLTAREN) 1 % GEL Apply 4 g topically 4 (four) times daily as needed. 350 g 0   glipiZIDE (GLUCOTROL XL) 5 MG 24 hr tablet Take 1 tablet (5 mg total) by mouth daily. 90 tablet 3   hydrocortisone cream 1 % Apply to affected area 2 times daily (Patient taking differently: Apply 1 Application topically 2 (two) times  daily as needed for itching.) 15 g 0   linaclotide (LINZESS) 145 MCG CAPS capsule Take 1 capsule (145 mcg total) by mouth daily before breakfast. (Patient not taking: Reported on 02/28/2023) 90 capsule 1   losartan-hydrochlorothiazide (HYZAAR) 50-12.5 MG tablet Take 1 tablet by mouth daily. (Patient not taking: Reported on 05/03/2023) 30 tablet 0   metFORMIN (GLUCOPHAGE-XR) 500 MG 24 hr tablet Take 1 tablet (500 mg total) by mouth daily with breakfast. 90 tablet 3   miconazole (MONISTAT 7) 2 % vaginal cream Place 1 Applicatorful vaginally at bedtime. (Patient not taking: Reported on  05/03/2023) 45 g 0   mupirocin ointment (BACTROBAN) 2 % Apply 1 Application topically 2 (two) times daily. To affected area till better (Patient taking differently: Apply 1 Application topically 2 (two) times daily as needed (wound care). To affected area till better) 22 g 0   naproxen (NAPROSYN) 500 MG tablet Take 1 tablet (500 mg total) by mouth 2 (two) times daily with a meal. 60 tablet 2   nystatin cream (MYCOSTATIN) Apply to affected area 2 times daily (Patient taking differently: Apply 1 Application topically 2 (two) times daily as needed for dry skin.) 30 g 0   omeprazole (PRILOSEC) 40 MG capsule TAKE 1 CAPSULE(40 MG) BY MOUTH DAILY 90 capsule 1   Pitavastatin Calcium 2 MG TABS Take 1 tablet (2 mg total) by mouth daily. 30 tablet 0   rizatriptan (MAXALT) 10 MG tablet Take 0.5-1 tablets (5-10 mg total) by mouth as needed for migraine. May repeat in 2 hours if needed max dose 20 mg/day 10 tablet 1   topiramate (TOPAMAX) 50 MG tablet Take 1 tablet (50 mg total) by mouth daily. 30 tablet 5   valACYclovir (VALTREX) 500 MG tablet TAKE 1 TABLET(500 MG) BY MOUTH TWICE DAILY 60 tablet 5   No facility-administered medications prior to visit.     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, per pt   Depression    Diabetes mellitus without complication (HCC)    type 2   Foreign body in small intestine    GERD (gastroesophageal reflux disease)    HIV (human immunodeficiency virus infection) (HCC)    Hypothyroidism    Stomach ulcer    Substance abuse (HCC)    Meth, IV drug use     Past Surgical History:  Procedure Laterality Date   CESAREAN SECTION N/A 07/04/2016   Procedure: CESAREAN SECTION;  Surgeon: [name], DO;  Location: [facility] BIRTHING SUITES;  Service: Obstetrics;  Laterality: N/A;   ENTEROSCOPY N/A 11/08/2017   Procedure: ENTEROSCOPY;   Surgeon: Hilarie Fredrickson, MD;  Location: [facility] ENDOSCOPY;  Service: Endoscopy;  Laterality: N/A;   NO PAST SURGERIES        Review of Systems  Constitutional:  Negative for chills, diaphoresis, fatigue and fever.  HENT:  Positive for congestion.   Respiratory:  Positive for cough. Negative for chest tightness, shortness of breath and wheezing.   Cardiovascular:  Negative for chest pain.  Gastrointestinal:  Negative for abdominal pain, diarrhea, nausea and vomiting.      Objective:    There were no vitals taken for this visit. Nursing note and vital signs reviewed.  Physical Exam Constitutional:      General: She is not in acute distress.    Appearance: She is well-developed.  HENT:  Right Ear: Tympanic membrane normal.     Left Ear: Tympanic membrane normal.     Mouth/Throat:     Mouth: Mucous membranes are moist.     Pharynx: No oropharyngeal exudate or posterior oropharyngeal erythema.  Eyes:     Conjunctiva/sclera: Conjunctivae normal.     Pupils: Pupils are equal, round, and reactive to light.  Cardiovascular:     Rate and Rhythm: Normal rate and regular rhythm.     Heart sounds: Normal heart sounds.  Pulmonary:     Effort: Pulmonary effort is normal.     Breath sounds: Normal breath sounds.  Genitourinary:    Vagina: Vaginal discharge present.     Cervix: No erythema or cervical bleeding.  Skin:    General: Skin is warm and dry.  Neurological:     Mental Status: She is alert and oriented to person, place, and time.  Psychiatric:        Mood and Affect: Mood normal.         04/29/2023    3:50 PM 03/25/2023    3:26 PM 03/11/2023    4:10 PM 12/11/2022    3:39 PM 11/15/2022    2:36 PM  Depression screen PHQ 2/9  Decreased Interest 0 1 1 0 0  Down, Depressed, Hopeless 1 1 1  0 0  PHQ - 2 Score 1 2 2  0 0  Altered sleeping  1 1    Tired, decreased energy  1 1    Change in appetite  1 1    Feeling bad or failure about yourself   1 1    Trouble concentrating  1 1     Moving slowly or fidgety/restless  0 0    Suicidal thoughts  0 0    PHQ-9 Score  7 7    Difficult doing work/chores   Somewhat difficult         Assessment & Plan:     Paronychia with maceration - Painful, macerated finger with mostly clear drainage and blistering. She still has erythema/swelling and tenderness to the nailbed. No systemic infection. Antibiotics required to prevent complications. Advised air exposure to prevent further maceration. - Prescribe Augmentin. - Advise leaving the finger open to air with light gauze wrapping. - Recommend Epsom salt baths.   Cervical Cancer Screening -  Pelvic exam completed today. Some thinner milky drainage. She has been on depo for contraception management. No symptoms - will collect vaginal fluid for sample and treat as indicated.  -pap smear collected for cytology - hpv -vaginal fluid for vaginitis panel   Laryngitis - Likely viral laryngitis with voice loss, possibly exacerbated by allergies. No fever or chills. Voice rest advised. - Advise voice rest. - Should resolve with time - Consider ENT referral if symptoms persist.  Recurrent yeast infections - Recurrent yeast infections without current symptoms. Prophylactic Diflucan planned. - Prescribe Diflucan, three tablets to be taken every three days with an extra dose at the end.  Follow-up Follow-up to assess treatment response for infected finger and prevent complications. - Follow up with [name] on May 29, 2023.       Meds ordered this encounter  Medications   amoxicillin-clavulanate (AUGMENTIN) 875-125 MG tablet    Sig: Take 1 tablet by mouth 2 (two) times daily.    Dispense:  20 tablet    Refill:  0   fluconazole (DIFLUCAN) 150 MG tablet    Sig: Take 1 tablet (150 mg total) by mouth every 3 (three) days.  Dispense:  3 tablet    Refill:  0   benzonatate (TESSALON PERLES) 100 MG capsule    Sig: Take 1 capsule (100 mg total) by mouth every 6 (six) hours as needed  for cough.    Dispense:  60 capsule    Refill:  0     Rexene Alberts, MSN, NP-C [facility]  [email or web address] Pager: [phone] Office: [phone] [facility] Main Line: [phone] *Secure Chat Communication Welcome

## 2023-05-22 NOTE — Patient Instructions (Signed)
 Tesslon pearls for cough to help you sleep   Augmentin antibiotic 1 tablet twice a day for your finger   Epsom salt soaks 2x a day may also help - put a tablespoon of salt in a bowl of warm (not hot) water and let it soak twice a day. Pat dry and leave open to air   Fluconazole for yeast infection - take 1 pill every 3 days while on antibiotics.

## 2023-05-24 ENCOUNTER — Encounter

## 2023-05-24 LAB — CERVICOVAGINAL ANCILLARY ONLY
Bacterial Vaginitis (gardnerella): NEGATIVE
Candida Glabrata: NEGATIVE
Candida Vaginitis: NEGATIVE
Chlamydia: NEGATIVE
Comment: NEGATIVE
Comment: NEGATIVE
Comment: NEGATIVE
Comment: NEGATIVE
Comment: NEGATIVE
Comment: NORMAL
Neisseria Gonorrhea: NEGATIVE
Trichomonas: NEGATIVE

## 2023-05-24 MED ORDER — ACCU-CHEK SOFTCLIX LANCET DEV KIT: 1.0000 | PACK | Freq: Every day | 0 refills | Status: AC

## 2023-05-27 ENCOUNTER — Ambulatory Visit: Payer: Self-pay | Admitting: Family Medicine

## 2023-05-28 ENCOUNTER — Other Ambulatory Visit: Payer: Self-pay | Admitting: *Deleted

## 2023-05-28 NOTE — Patient Outreach (Signed)
 Medicaid Managed Care   Nurse Care Manager Note  05/28/2023 Name:  Kristin Hart MRN:  [id number] DOB:  1980-09-23  Kristin Hart is an 42 y.o. year old female who is a primary patient of Mabe, Earvin Hansen, MD.  The [facility] Managed Care Coordination team was consulted for assistance with:    DMII  [name] was given information about Medicaid Managed Care Coordination team services today. Kristin Hart Patient agreed to services and verbal consent obtained.  Engaged with patient by telephone for follow up visit in response to provider referral for case management and/or care coordination services.   Patient is participating in a Managed Medicaid Plan:  Yes  Assessments/Interventions:  Review of past medical history, allergies, medications, health status, including review of consultants reports, laboratory and other test data, was performed as part of comprehensive evaluation and provision of chronic care management services.  SDOH (Social Drivers of Health) assessments and interventions performed: SDOH Interventions    Flowsheet Row Patient Outreach from 05/01/2023 in [place] POPULATION HEALTH DEPARTMENT Office Visit from 12/04/2019 in [facility] [place]  SDOH Interventions    Food Insecurity Interventions Other (Comment)  [Referral to [facility]] --  Housing Interventions Other (Comment)  [Referral to BSW] --  Transportation Interventions --  [Provided patient with medical transportation provided by [employer] [phone] --  Utilities Interventions Intervention Not Indicated --  Depression Interventions/Treatment  -- Medication       Care Plan  No Known Allergies  Medications Reviewed Today     Reviewed by Heidi Dach, RN (Registered Nurse) on 05/28/23 at 1556  Med List Status: <None>   Medication Order Taking? Sig Documenting Provider Last Dose Status Informant  acetaminophen (TYLENOL) 500 MG tablet [id number]   Take 1 tablet (500 mg total) by mouth every 6 (six) hours as needed. Debby Freiberg, NP  Active Self           Med Note Lyndal Rainbow Jun 17, 2022  3:20 PM) prn  albuterol (VENTOLIN HFA) 108 (90 Base) MCG/ACT inhaler [id number] Yes INHALE 1 PUFF INTO THE LUNGS EVERY 4 HOURS AS NEEDED. Veryl Speak, FNP Taking Active   amoxicillin-clavulanate (AUGMENTIN) 875-125 MG tablet [id number] Yes Take 1 tablet by mouth 2 (two) times daily. Blanchard Kelch, NP Taking Active   benzonatate (TESSALON PERLES) 100 MG capsule [id number] No Take 1 capsule (100 mg total) by mouth every 6 (six) hours as needed for cough.  Patient not taking: Reported on 05/28/2023   Blanchard Kelch, NP Not Taking Active   bictegravir-emtricitabine-tenofovir AF (BIKTARVY) 50-200-25 MG TABS tablet [id number] Yes Take 1 tablet by mouth daily. Veryl Speak, FNP Taking Active   Blood Glucose Monitoring Suppl (BLOOD GLUCOSE MONITOR SYSTEM) w/Device KIT [id number] No 1 Device by Does not apply route daily before breakfast.  Patient not taking: Reported on 05/01/2023   Evette Georges, MD Not Taking Active   cetirizine (ZYRTEC) 10 MG tablet [id number] Yes TAKE 1 TABLET(10 MG) BY MOUTH DAILY Veryl Speak, FNP Taking Active   cyclobenzaprine (FLEXERIL) 5 MG tablet [id number] Yes Take 1 tablet (5 mg total) by mouth at bedtime.  Patient taking differently: Take 5 mg by mouth at bedtime as needed for muscle spasms.   Veryl Speak, FNP Taking Active Self           Med Note Gunnar Fusi, MELISSA R   Fri Dec 07, 2022  4:41 PM)    diclofenac Sodium (VOLTAREN) 1 % GEL [id number] Yes Apply 4 g topically 4 (four) times daily as needed. Veryl Speak, FNP Taking Active Self  fluconazole (DIFLUCAN) 150 MG tablet [id number] Yes Take 1 tablet (150 mg total) by mouth every 3 (three) days. Blanchard Kelch, NP Taking Active   glipiZIDE (GLUCOTROL XL) 5 MG 24 hr tablet [id number] Yes Take 1 tablet (5 mg total) by mouth daily. Evette Georges, MD Taking Active   hydrocortisone cream 1 % [id number] Yes Apply to affected area 2 times daily  Patient taking differently: Apply 1 Application topically 2 (two) times daily as needed for itching.   Henderly, Britni A, PA-C Taking Active Self  Lancets Misc. (ACCU-CHEK SOFTCLIX LANCET DEV) KIT [id number] No 1 each by Does not apply route daily before breakfast.  Patient not taking: Reported on 05/28/2023   Evette Georges, MD Not Taking Active   linaclotide Karlene Einstein) 145 MCG CAPS capsule [id number] No Take 1 capsule (145 mcg total) by mouth daily before breakfast.  Patient not taking: Reported on 02/28/2023   Veryl Speak, FNP Not Taking Active Self  losartan-hydrochlorothiazide (HYZAAR) 50-12.5 MG tablet [id number] No Take 1 tablet by mouth daily.  Patient not taking: Reported on 05/01/2023   Evette Georges, MD Not Taking Active   metFORMIN (GLUCOPHAGE-XR) 500 MG 24 hr tablet [id number] Yes Take 1 tablet (500 mg total) by mouth daily with breakfast. Evette Georges, MD Taking Active   miconazole (MONISTAT 7) 2 % vaginal cream [id number] No Place 1 Applicatorful vaginally at bedtime.  Patient not taking: Reported on 05/01/2023   Veryl Speak, FNP Not Taking Active   mupirocin ointment (BACTROBAN) 2 % [id number] Yes Apply 1 Application topically 2 (two) times daily. To affected area till better  Patient taking differently: Apply 1 Application topically 2 (two) times daily as needed (wound care). To affected area till better   Veryl Speak, FNP Taking Active Self  naproxen (NAPROSYN) 500 MG tablet [id number] Yes Take 1 tablet (500 mg total) by mouth 2 (two) times daily with a meal. Veryl Speak, FNP Taking Active Self  nystatin cream (MYCOSTATIN) [id number] Yes Apply to affected area 2 times daily  Patient taking differently: Apply 1 Application topically 2 (two) times daily as needed for dry skin.   Zenia Resides, MD Taking Active Self  omeprazole (PRILOSEC) 40 MG capsule  [id number] Yes TAKE 1 CAPSULE(40 MG) BY MOUTH DAILY Veryl Speak, FNP Taking Active Self  Pitavastatin Calcium 2 MG TABS [id number]  Take 1 tablet (2 mg total) by mouth daily. Evette Georges, MD  Expired 04/24/23 2359   rizatriptan (MAXALT) 10 MG tablet [id number] Yes Take 0.5-1 tablets (5-10 mg total) by mouth as needed for migraine. May repeat in 2 hours if needed max dose 20 mg/day Veryl Speak, FNP Taking Active Self  topiramate (TOPAMAX) 50 MG tablet [id number] Yes Take 1 tablet (50 mg total) by mouth daily. Veryl Speak, FNP Taking Active Self  valACYclovir (VALTREX) 500 MG tablet [id number] Yes TAKE 1 TABLET(500 MG) BY MOUTH TWICE DAILY Veryl Speak, FNP Taking Active             Patient Active Problem List   Diagnosis Date Noted   Infection of left hand 04/24/2023   Dry skin 03/12/2023   Diarrhea 12/12/2022   Lower abdominal pain 05/28/2022   Post-nasal drip 05/28/2022   Plantar fasciitis, right 03/19/2022   Numbness and tingling in  both hands 03/19/2022   Poor social situation 10/25/2021   Axillary abscess 09/20/2021   Therapeutic drug monitoring 08/24/2021   Birth control counseling 12/20/2020   Hypertension 09/21/2020   Grief 08/29/2020   Encounter for surveillance of injectable contraceptive 07/29/2020   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   Screening for cervical cancer 04/21/2020   Screening for STDs (sexually transmitted diseases) 04/21/2020   Costochondritis 02/19/2020   Bilateral hip pain 02/19/2020   Elbow pain, right 12/04/2019   Housing problems 11/23/2019   Mental health disorder 10/12/2019   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   Paresthesias 10/12/2019   Healthcare maintenance 08/05/2019   HIV disease (HCC) 12/31/2018   Anxiety 05/29/2017   Self-mutilation 05/29/2017   Depression 06/07/2016   Marijuana use 06/07/2016   Low vitamin D level 03/15/2016   Type 2 diabetes mellitus with hyperglycemia (HCC) 02/22/2016    Asthma 02/22/2016   ADD (attention deficit disorder) 02/22/2016   Methamphetamine use disorder, severe, 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/22/2016   Hyperlipidemia with target LDL less than 70 01/31/2012   Migraine 01/31/2012   Tremor, 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    Conditions to be addressed/monitored per PCP order:  DMII  Care Plan : RN Care Manager Plan of Care  Updates made by Heidi Dach, RN since 05/28/2023 12:00 AM     Problem: Health Management needs related to DM      Long-Range Goal: Development of Plan of Care to address Health Management needs related to DM   Start Date: 05/01/2023  Expected End Date: 07/30/2023  Note:   Current Barriers:  Chronic Disease Management support and education needs related to DMII   RNCM Clinical Goal(s):  Patient will verbalize understanding of plan for management of DMII as evidenced by Patient reports take all medications exactly as prescribed and will call provider for medication related questions as evidenced by Patient reports attend all scheduled medical appointments: 05/29/23 with [facility], 06/10/23 with PCP and 06/11/23 with Endocrinology as evidenced by provider documentation in EMR continue to work with RN Care Manager to address care management and care coordination needs related to  DMII as evidenced by adherence to CM Team Scheduled appointments work with Child psychotherapist to address  related to the management of Financial constraints related to food, housing and utilities related to the management of DMII as evidenced by review of EMR and patient or Child psychotherapist report through collaboration with Medical illustrator, provider, and care team.   Interventions: Evaluation of current treatment plan related to  self management and patient's adherence to plan as established by provider   Diabetes Interventions:  (Status:  Goal on track:  Yes.) Long Term Goal Assessed patient's understanding of A1c goal: <7% Reviewed medications with patient and discussed importance  of medication adherence Counseled on importance of regular laboratory monitoring as prescribed Discussed plans with patient for ongoing care management follow up and provided patient with direct contact information for care management team Reviewed scheduled/upcoming provider appointments including: [facility] on 05/29/23, PCP on 06/10/23, [facility] on 05/29/23, Endocrinology on 06/11/23 and [facility] on 06/12/23 Advised patient, providing education and rationale, to check cbg every morning and record, calling PCP for findings outside established parameters Review of patient status, including review of consultants reports, relevant laboratory and other test results, and medications completed Assessed social determinant of health barriers Discussed member benefits provided by [facility], advised to contact member services [phone] to inquire about glucometer, fresh fruits and vegetables and smart phone Provided patient with  contact information regarding medical transportation provided by [facility] [phone] Educated patient on foods to avoid, discussed My[employer]ate  Provided patient with [facility] [phone] and [facility] [address] [phone] or may find other providers on the [facility] website, advised patient to call for an eye exam-revisited Advised patient to pick up glucometer from [facility] today and start checking blood sugar prior to eating or drinking anything each morning Discussed writing down blood sugar results and taking to upcoming appointment with PCP and Endocrinology Discussed transfer to new [facility], scheduled on 06/21/23  Lab Results  Component Value Date   HGBA1C 11.2 (A) 03/25/2023    Patient Goals/Self-Care Activities: Take all medications as prescribed Attend all scheduled provider appointments Call provider office for new concerns or questions  Work with the social worker to address care coordination needs and will continue to work with the clinical team to address health care  and disease management related needs Contact [employer] member services for member benefits schedule appointment with eye doctor drink 6 to 8 glasses of water each day fill half of plate with vegetables manage portion size switch to sugar-free drinks Obtain a glucometer  Follow Up Plan:  Telephone follow up appointment with care management team member scheduled for:  06/21/23 at 3:15pm      Follow Up:  Patient agrees to Care Plan and Follow-up.  Plan: The Managed Medicaid care management team will reach out to the patient again over the next 30 days.  Date/time of next scheduled RN care management/care coordination outreach:  06/21/23 at 3:15pm  Estanislado Emms RN, BSN [place]  [facility] RN Care Manager [phone]

## 2023-05-28 NOTE — Patient Instructions (Signed)
 Visit Information  Ms. Ballman was given information about Medicaid Managed Care team care coordination services as a part of their Healthy [facility] Medicaid benefit. [name] verbally consented to engagement with the [facility] Managed Care team.   If you are experiencing a medical emergency, please call 911 or report to your local emergency department or urgent care.   If you have a non-emergency medical problem during routine business hours, please contact your provider's office and ask to speak with a nurse.   For questions related to your Healthy [facility] health plan, please call: [phone] or visit the homepage here: [email or web address]  If you would like to schedule transportation through your Healthy [facility] plan, please call the following number at least 2 days in advance of your appointment: [phone]  For information about your ride after you set it up, call Ride Assist at [phone]. Use this number to activate a Will Call pickup, or if your transportation is late for a scheduled pickup. Use this number, too, if you need to make a change or cancel a previously scheduled reservation.  If you need transportation services right away, call [phone]. The after-hours call center is staffed 24 hours to handle ride assistance and urgent reservation requests (including discharges) 365 days a year. Urgent trips include sick visits, hospital discharge requests and life-sustaining treatment.  Call the [facility] Line at [phone], at any time, 24 hours a day, 7 days a week. If you are in danger or need immediate medical attention call 911.  If you would like help to quit smoking, call 1-800-QUIT-NOW (662-205-4384) OR Espaol: 1-855-Djelo-Ya (1-093-235-5732) o para ms informacin haga clic aqu or Text READY to 202-542 to register via text  Ms. Renk,   Please see education materials related to diabetes provided by  MyChart link.  Patient verbalizes understanding of instructions and care plan provided today and agrees to view in MyChart. Active MyChart status and patient understanding of how to access instructions and care plan via MyChart confirmed with patient.     Telephone follow up appointment with Managed Medicaid care management team member scheduled for:06/21/23 at 3:15pm  Estanislado Emms RN, BSN [place]  [facility] RN Care Manager [phone]   Following is a copy of your plan of care:  Care Plan : RN Care Manager Plan of Care  Updates made by Heidi Dach, RN since 05/28/2023 12:00 AM     Problem: Health Management needs related to DM      Long-Range Goal: Development of Plan of Care to address Health Management needs related to DM   Start Date: 05/01/2023  Expected End Date: 07/30/2023  Note:   Current Barriers:  Chronic Disease Management support and education needs related to DMII   RNCM Clinical Goal(s):  Patient will verbalize understanding of plan for management of DMII as evidenced by Patient reports take all medications exactly as prescribed and will call provider for medication related questions as evidenced by Patient reports attend all scheduled medical appointments: 05/29/23 with [facility], 06/10/23 with PCP and 06/11/23 with Endocrinology as evidenced by provider documentation in EMR continue to work with RN Care Manager to address care management and care coordination needs related to  DMII as evidenced by adherence to CM Team Scheduled appointments work with social worker to address  related to the management of Financial constraints related to food, housing and utilities related to the management of DMII as evidenced by review of EMR and patient or social  worker report through collaboration with Medical illustrator, provider, and care team.   Interventions: Evaluation of current treatment plan related to  self management and patient's adherence  to plan as established by provider   Diabetes Interventions:  (Status:  Goal on track:  Yes.) Long Term Goal Assessed patient's understanding of A1c goal: <7% Reviewed medications with patient and discussed importance of medication adherence Counseled on importance of regular laboratory monitoring as prescribed Discussed plans with patient for ongoing care management follow up and provided patient with direct contact information for care management team Reviewed scheduled/upcoming provider appointments including: [facility] on 05/29/23, PCP on 06/10/23, [facility] on 05/29/23, Endocrinology on 06/11/23 and [facility] on 06/12/23 Advised patient, providing education and rationale, to check cbg every morning and record, calling PCP for findings outside established parameters Review of patient status, including review of consultants reports, relevant laboratory and other test results, and medications completed Assessed social determinant of health barriers Discussed member benefits provided by [facility], advised to contact member services [phone] to inquire about glucometer, fresh fruits and vegetables and smart phone Provided patient with contact information regarding medical transportation provided by [employer] [phone] Educated patient on foods to avoid, discussed MyPlate  Provided patient with [facility] [phone] and [facility] [address] [phone] or may find other providers on the [facility] website, advised patient to call for an eye exam-revisited Advised patient to pick up glucometer from [facility] today and start checking blood sugar prior to eating or drinking anything each morning Discussed writing down blood sugar results and taking to upcoming appointment with PCP and Endocrinology Discussed transfer to new [facility], scheduled on 06/21/23  Lab Results  Component Value Date   HGBA1C 11.2 (A) 03/25/2023    Patient Goals/Self-Care Activities: Take all medications as prescribed Attend  all scheduled provider appointments Call provider office for new concerns or questions  Work with the social worker to address care coordination needs and will continue to work with the clinical team to address health care and disease management related needs Contact [employer] member services for member benefits schedule appointment with eye doctor drink 6 to 8 glasses of water each day fill half of plate with vegetables manage portion size switch to sugar-free drinks Obtain a glucometer  Follow Up Plan:  Telephone follow up appointment with care management team member scheduled for:  06/21/23 at 3:15pm

## 2023-05-29 ENCOUNTER — Encounter: Payer: Self-pay | Admitting: Family

## 2023-05-29 ENCOUNTER — Ambulatory Visit: Payer: Medicaid Other | Admitting: Family

## 2023-05-29 ENCOUNTER — Other Ambulatory Visit: Payer: Self-pay

## 2023-05-29 VITALS — BP 148/96 | HR 106 | Temp 97.6°F

## 2023-05-29 DIAGNOSIS — B20 Human immunodeficiency virus [HIV] disease: Secondary | ICD-10-CM

## 2023-05-29 DIAGNOSIS — F1520 Other stimulant dependence, uncomplicated: Secondary | ICD-10-CM | POA: Diagnosis not present

## 2023-05-29 DIAGNOSIS — Z0000 Encounter for general adult medical examination without abnormal findings: Secondary | ICD-10-CM

## 2023-05-29 DIAGNOSIS — Z599 Problem related to housing and economic circumstances, unspecified: Secondary | ICD-10-CM

## 2023-05-29 LAB — CYTOLOGY - PAP
Adequacy: ABSENT
Comment: NEGATIVE
Diagnosis: NEGATIVE
High risk HPV: NEGATIVE

## 2023-05-29 NOTE — Assessment & Plan Note (Signed)
 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  Reviewed previous lab work and discussed plan of care and agrees U.  Plan to check blood work at next office visit.  Social determinants of health reviewed with interventions noted under specific areas.  Continue current dose of Biktarvy.  Plan for follow-up in 1 month or sooner if needed

## 2023-05-29 NOTE — Assessment & Plan Note (Signed)
 Discussed importance of safe sexual practice and condom use. Condoms and site specific STD testing offered.  Vaccinations reviewed Awaiting dental surgery. Pap smear completed at last office visit.

## 2023-05-29 NOTE — Assessment & Plan Note (Signed)
 Continues to stay with her son's grandmother. Stable at present. Asked assistance for mattress and have relayed this to her case manager for any available resources.

## 2023-05-29 NOTE — Assessment & Plan Note (Signed)
 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 Discussed importance of cessation and continued risk with long term use. Resources discussed. Not ready to quit at this time.

## 2023-05-29 NOTE — Progress Notes (Signed)
 Brief Narrative   Patient ID: Kristin Hart, female    DOB: [birth year], 43 y.o.   MRN: [id number]  [name] is a 43 y/o caucasian female diagnosed with HIV disease on 01/02/19 with risk factor of heterosexual contact and IV drug use. Initial viral load on 06/23/19 23,300 and CD4 count 577. Genotype with no significant drug resistance. Entered care at [facility] Stage 1. [id number] negative. No history of opportunistic infection. Sole medication regimen of Biktarvy   Subjective:    Chief Complaint  Patient presents with   Follow-up    B20    HPI:  Kristin Hart is a 43 y.o. female with HIV disease last seen on 04/29/2023 with well-controlled virus and good adherence and tolerance to [employer].  Last lab work completed with viral load and effective well and CD4 count 973. Seen for acute office visit last week by Rexene Alberts, NP for paronychia.  Here today for routine follow-up.  Kristin Hart has been doing okay since her last office visit and continues to take [place] daily as prescribed with no adverse side effects or problems obtaining occasion from the pharmacy.  Mains covered by Medicaid.  Continues to take other medications as prescribed but has been having difficulty getting getting a glucose meter from the pharmacy to monitor her blood sugars.  She is working with her family medicine provider for this..  Finger is improved and continues to complete her Augmentin as prescribed.  Housing remains stable and access to food is okay with enrollment in food assistance program.  Transportation via bus with bus passes provided.  Sleeping on a trundle bed next to her son most nights and some nights under the stairs.  Continues to use methamphetamine although slightly less.  Condoms and site-specific STD testing offered.  Healthcare maintenance reviewed.  Denies fevers, chills, night sweats, headaches, changes in vision, neck pain/stiffness, nausea, diarrhea, vomiting, lesions or rashes.  Lab Results   Component Value Date   CD4TCELL 61 11/15/2022   CD4TABS 973 11/15/2022   Lab Results  Component Value Date   HIV1RNAQUANT Not Detected 11/15/2022     No Known Allergies    Outpatient Medications Prior to Visit  Medication Sig Dispense Refill   acetaminophen (TYLENOL) 500 MG tablet Take 1 tablet (500 mg total) by mouth every 6 (six) hours as needed. 30 tablet 0   albuterol (VENTOLIN HFA) 108 (90 Base) MCG/ACT inhaler INHALE 1 PUFF INTO THE LUNGS EVERY 4 HOURS AS NEEDED. 18 each 2   amoxicillin-clavulanate (AUGMENTIN) 875-125 MG tablet Take 1 tablet by mouth 2 (two) times daily. 20 tablet 0   bictegravir-emtricitabine-tenofovir AF (BIKTARVY) 50-200-25 MG TABS tablet Take 1 tablet by mouth daily. 30 tablet 5   Blood Glucose Monitoring Suppl (BLOOD GLUCOSE MONITOR SYSTEM) w/Device KIT 1 Device by Does not apply route daily before breakfast. 1 kit 0   cetirizine (ZYRTEC) 10 MG tablet TAKE 1 TABLET(10 MG) BY MOUTH DAILY 30 tablet 3   cyclobenzaprine (FLEXERIL) 5 MG tablet Take 1 tablet (5 mg total) by mouth at bedtime. (Patient taking differently: Take 5 mg by mouth at bedtime as needed for muscle spasms.) 30 tablet 4   diclofenac Sodium (VOLTAREN) 1 % GEL Apply 4 g topically 4 (four) times daily as needed. 350 g 0   fluconazole (DIFLUCAN) 150 MG tablet Take 1 tablet (150 mg total) by mouth every 3 (three) days. 3 tablet 0   glipiZIDE (GLUCOTROL XL) 5 MG 24 hr tablet Take 1 tablet (5  mg total) by mouth daily. 90 tablet 3   hydrocortisone cream 1 % Apply to affected area 2 times daily (Patient taking differently: Apply 1 Application topically 2 (two) times daily as needed for itching.) 15 g 0   Lancets Misc. (ACCU-CHEK SOFTCLIX LANCET DEV) KIT 1 each by Does not apply route daily before breakfast. 1 kit 0   metFORMIN (GLUCOPHAGE-XR) 500 MG 24 hr tablet Take 1 tablet (500 mg total) by mouth daily with breakfast. 90 tablet 3   mupirocin ointment (BACTROBAN) 2 % Apply 1 Application topically 2  (two) times daily. To affected area till better (Patient taking differently: Apply 1 Application topically 2 (two) times daily as needed (wound care). To affected area till better) 22 g 0   naproxen (NAPROSYN) 500 MG tablet Take 1 tablet (500 mg total) by mouth 2 (two) times daily with a meal. 60 tablet 2   nystatin cream (MYCOSTATIN) Apply to affected area 2 times daily (Patient taking differently: Apply 1 Application topically 2 (two) times daily as needed for dry skin.) 30 g 0   omeprazole (PRILOSEC) 40 MG capsule TAKE 1 CAPSULE(40 MG) BY MOUTH DAILY 90 capsule 1   rizatriptan (MAXALT) 10 MG tablet Take 0.5-1 tablets (5-10 mg total) by mouth as needed for migraine. May repeat in 2 hours if needed max dose 20 mg/day 10 tablet 1   topiramate (TOPAMAX) 50 MG tablet Take 1 tablet (50 mg total) by mouth daily. 30 tablet 5   valACYclovir (VALTREX) 500 MG tablet TAKE 1 TABLET(500 MG) BY MOUTH TWICE DAILY 60 tablet 5   Pitavastatin Calcium 2 MG TABS Take 1 tablet (2 mg total) by mouth daily. 30 tablet 0   benzonatate (TESSALON PERLES) 100 MG capsule Take 1 capsule (100 mg total) by mouth every 6 (six) hours as needed for cough. (Patient not taking: Reported on 05/29/2023) 60 capsule 0   linaclotide (LINZESS) 145 MCG CAPS capsule Take 1 capsule (145 mcg total) by mouth daily before breakfast. (Patient not taking: Reported on 02/28/2023) 90 capsule 1   losartan-hydrochlorothiazide (HYZAAR) 50-12.5 MG tablet Take 1 tablet by mouth daily. (Patient not taking: Reported on 05/29/2023) 30 tablet 0   miconazole (MONISTAT 7) 2 % vaginal cream Place 1 Applicatorful vaginally at bedtime. (Patient not taking: Reported on 05/01/2023) 45 g 0   No facility-administered medications prior to visit.     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, per pt   Depression    Diabetes  mellitus without complication (HCC)    type 2   Foreign body in small intestine    GERD (gastroesophageal reflux disease)    HIV (human immunodeficiency virus infection) (HCC)    Hypothyroidism    Stomach ulcer    Substance abuse (HCC)    Meth, IV drug use     Past Surgical History:  Procedure Laterality Date   CESAREAN SECTION N/A 07/04/2016   Procedure: CESAREAN SECTION;  Surgeon: [name], DO;  Location: [facility] BIRTHING SUITES;  Service: Obstetrics;  Laterality: N/A;   ENTEROSCOPY N/A 11/08/2017   Procedure: ENTEROSCOPY;  Surgeon: Hilarie Fredrickson, MD;  Location: [facility] ENDOSCOPY;  Service: Endoscopy;  Laterality: N/A;   NO PAST SURGERIES        Review of Systems  Constitutional:  Negative for appetite change, chills, diaphoresis, fatigue, fever and unexpected weight  change.  Eyes:        Negative for acute change in vision  Respiratory:  Negative for chest tightness, shortness of breath and wheezing.   Cardiovascular:  Negative for chest pain.  Gastrointestinal:  Negative for diarrhea, nausea and vomiting.  Genitourinary:  Negative for dysuria, pelvic pain and vaginal discharge.  Musculoskeletal:  Negative for neck pain and neck stiffness.  Skin:  Negative for rash.  Neurological:  Negative for seizures, syncope, weakness and headaches.  Hematological:  Negative for adenopathy. Does not bruise/bleed easily.  Psychiatric/Behavioral:  Negative for hallucinations.       Objective:    BP (!) 148/96   Pulse (!) 106   Temp 97.6 F (36.4 C) (Temporal)  Nursing note and vital signs reviewed.  Physical Exam Constitutional:      General: She is not in acute distress.    Appearance: She is well-developed.  Eyes:     Conjunctiva/sclera: Conjunctivae normal.  Cardiovascular:     Rate and Rhythm: Normal rate and regular rhythm.     Heart sounds: Normal heart sounds. No murmur heard.    No friction rub. No gallop.  Pulmonary:     Effort: Pulmonary effort is normal. No  respiratory distress.     Breath sounds: Normal breath sounds. No wheezing or rales.  Chest:     Chest wall: No tenderness.  Abdominal:     General: Bowel sounds are normal.     Palpations: Abdomen is soft.     Tenderness: There is no abdominal tenderness.  Musculoskeletal:     Cervical back: Neck supple.  Lymphadenopathy:     Cervical: No cervical adenopathy.  Skin:    General: Skin is warm and dry.     Findings: No rash.  Neurological:     Mental Status: She is alert.  Psychiatric:        Mood and Affect: Mood normal.         04/29/2023    3:50 PM 03/25/2023    3:26 PM 03/11/2023    4:10 PM 12/11/2022    3:39 PM 11/15/2022    2:36 PM  Depression screen PHQ 2/9  Decreased Interest 0 1 1 0 0  Down, Depressed, Hopeless 1 1 1  0 0  PHQ - 2 Score 1 2 2  0 0  Altered sleeping  1 1    Tired, decreased energy  1 1    Change in appetite  1 1    Feeling bad or failure about yourself   1 1    Trouble concentrating  1 1    Moving slowly or fidgety/restless  0 0    Suicidal thoughts  0 0    PHQ-9 Score  7 7    Difficult doing work/chores   Somewhat difficult         Assessment & Plan:    Patient Active Problem List   Diagnosis Date Noted   Infection of left hand 04/24/2023   Dry skin 03/12/2023   Diarrhea 12/12/2022   Lower abdominal pain 05/28/2022   Post-nasal drip 05/28/2022   Plantar fasciitis, right 03/19/2022   Numbness and tingling in both hands 03/19/2022   Poor social situation 10/25/2021   Axillary abscess 09/20/2021   Therapeutic drug monitoring 08/24/2021   Birth control counseling 12/20/2020   Hypertension 09/21/2020   Grief 08/29/2020   Encounter for surveillance of injectable contraceptive 07/29/2020   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   Screening for cervical cancer 04/21/2020  Screening for STDs (sexually transmitted diseases) 04/21/2020   Costochondritis 02/19/2020   Bilateral hip pain 02/19/2020   Elbow pain, right  12/04/2019   Housing problems 11/23/2019   Mental health disorder 10/12/2019   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   Paresthesias 10/12/2019   Healthcare maintenance 08/05/2019   HIV disease (HCC) 12/31/2018   Anxiety 05/29/2017   Self-mutilation 05/29/2017   Depression 06/07/2016   Marijuana use 06/07/2016   Low vitamin D level 03/15/2016   Type 2 diabetes mellitus with hyperglycemia (HCC) 02/22/2016   Asthma 02/22/2016   ADD (attention deficit disorder) 02/22/2016   Methamphetamine use disorder, severe, 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/22/2016   Hyperlipidemia with target LDL less than 70 01/31/2012   Migraine 01/31/2012   Tremor, 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     Problem List Items Addressed This Visit       Other   Methamphetamine use disorder, severe, dependence (HCC)   Continues to use methamphetamine. Discussed importance of cessation and continued risk with long term use. Resources discussed. Not ready to quit at this time.       HIV disease (HCC) - Primary   Dejae continues to have well-controlled virus with good adherence and tolerance to [employer].  Reviewed previous lab work and discussed plan of care and agrees U.  Plan to check blood work at next office visit.  Social determinants of health reviewed with interventions noted under specific areas.  Continue current dose of Biktarvy.  Plan for follow-up in 1 month or sooner if needed      Healthcare maintenance   Discussed importance of safe sexual practice and condom use. Condoms and site specific STD testing offered.  Vaccinations reviewed Awaiting dental surgery. Pap smear completed at last office visit.       Housing problems   Continues to stay with her son's grandmother. Stable at present. Asked assistance for mattress and have relayed this to her case manager for any available resources.         I have discontinued Alvan Dame. Ferrari's linaclotide, miconazole, losartan-hydrochlorothiazide, and benzonatate. I am also having  her maintain her hydrocortisone cream, cyclobenzaprine, acetaminophen, nystatin cream, omeprazole, mupirocin ointment, topiramate, rizatriptan, diclofenac Sodium, naproxen, metFORMIN, glipiZIDE, Blood Glucose Monitor System, Pitavastatin Calcium, cetirizine, albuterol, valACYclovir, Biktarvy, amoxicillin-clavulanate, fluconazole, and Accu-Chek Softclix Lancet Dev.   Follow-up: Return in about 1 month (around 06/29/2023). or sooner if needed.    Marcos Eke, MSN, FNP-C Nurse Practitioner [facility] Main number: [phone]

## 2023-05-29 NOTE — Patient Instructions (Signed)
 Nice to see you.  We will check your lab work next visit - bring squeeze ball!  Continue to take your medication daily as prescribed.  Refills have been sent to the pharmacy.  Plan for follow up in 1 months or sooner if needed with lab work on the same day.  Have a great day and stay safe!

## 2023-05-30 MED ORDER — LANCET DEVICE MISC: 1.0000 | Freq: Three times a day (TID) | 0 refills | Status: AC

## 2023-05-30 MED ORDER — BLOOD GLUCOSE TEST VI STRP: 1.0000 | ORAL_STRIP | Freq: Three times a day (TID) | 0 refills | Status: AC

## 2023-05-30 MED ORDER — BLOOD GLUCOSE MONITOR SYSTEM W/DEVICE KIT
1.0000 | PACK | Freq: Every day | 0 refills | Status: AC
Start: 2023-05-30 — End: ?

## 2023-05-30 MED ORDER — BLOOD GLUCOSE MONITORING SUPPL DEVI: 1.0000 | Freq: Two times a day (BID) | 0 refills | Status: AC

## 2023-05-30 NOTE — Addendum Note (Signed)
 Addended by: Evette Georges B on: 05/30/2023 03:17 PM   Modules accepted: Orders

## 2023-05-30 NOTE — Telephone Encounter (Cosign Needed)
 I have sent in glucose device prescription. I hope the pharmacy is able to help her get this over the counter. She should measure her blood pressure before breakfast and before dinner.

## 2023-06-10 ENCOUNTER — Encounter: Payer: Self-pay | Admitting: Family Medicine

## 2023-06-10 ENCOUNTER — Ambulatory Visit: Admitting: Family Medicine

## 2023-06-10 VITALS — BP 154/107 | HR 119 | Ht 66.73 in | Wt 159.0 lb

## 2023-06-10 DIAGNOSIS — M25552 Pain in left hip: Secondary | ICD-10-CM | POA: Diagnosis not present

## 2023-06-10 DIAGNOSIS — J4521 Mild intermittent asthma with (acute) exacerbation: Secondary | ICD-10-CM

## 2023-06-10 DIAGNOSIS — R232 Flushing: Secondary | ICD-10-CM | POA: Diagnosis not present

## 2023-06-10 DIAGNOSIS — Z7984 Long term (current) use of oral hypoglycemic drugs: Secondary | ICD-10-CM

## 2023-06-10 DIAGNOSIS — I158 Other secondary hypertension: Secondary | ICD-10-CM

## 2023-06-10 DIAGNOSIS — M25551 Pain in right hip: Secondary | ICD-10-CM | POA: Diagnosis not present

## 2023-06-10 DIAGNOSIS — E1165 Type 2 diabetes mellitus with hyperglycemia: Secondary | ICD-10-CM

## 2023-06-10 MED ORDER — PITAVASTATIN CALCIUM 2 MG PO TABS: 2.0000 mg | ORAL_TABLET | Freq: Every day | ORAL | 3 refills | Status: DC

## 2023-06-10 MED ORDER — LOSARTAN POTASSIUM-HCTZ 50-12.5 MG PO TABS: 1.0000 | ORAL_TABLET | Freq: Every day | ORAL | 3 refills | Status: AC

## 2023-06-10 MED ORDER — ACCU-CHEK SOFTCLIX LANCETS MISC: 12 refills | Status: AC

## 2023-06-10 MED ORDER — GLIPIZIDE ER 10 MG PO TB24: 10.0000 mg | ORAL_TABLET | Freq: Every day | ORAL | 3 refills | Status: DC

## 2023-06-10 MED ORDER — ALBUTEROL SULFATE HFA 108 (90 BASE) MCG/ACT IN AERS: INHALATION_SPRAY | RESPIRATORY_TRACT | 2 refills | Status: AC

## 2023-06-10 MED ORDER — DICLOFENAC SODIUM 1 % EX GEL: 4.0000 g | Freq: Four times a day (QID) | CUTANEOUS | 0 refills | Status: DC | PRN

## 2023-06-10 MED ORDER — METFORMIN HCL ER 500 MG PO TB24: 1000.0000 mg | ORAL_TABLET | Freq: Two times a day (BID) | ORAL | 3 refills | Status: DC

## 2023-06-10 NOTE — Patient Instructions (Addendum)
 I have sent in your refills as listed below. Take your Hyzaar for blood pressure and pitavastatin for cholesterol.  We increased your glipizide and metformin today.  I will collect thyroid and hormone studies today.  Try to reduce your use of naproxen as tolerated. Try to take tylenol instead. This would help prevent kidney and stomach injury.

## 2023-06-10 NOTE — Progress Notes (Unsigned)
    SUBJECTIVE:   CHIEF COMPLAINT / HPI:   T2DM Sugars have been 200s-300s over the last couple of days.  HTN ***  Hot intolerance Feeling this way for 6 months. Has been on depo so does not have regular periods at baseline. All throughout the day without any specific time course.  Naproxen Burisitis and plantar fasciitis. Has been on for 6 months.  PERTINENT  PMH / PSH: ***  OBJECTIVE:   BP 123/80   Pulse (!) 119   Ht 5' 6.73" (1.695 m)   Wt 159 lb (72.1 kg)   BMI 25.10 kg/m   ***  ASSESSMENT/PLAN:   Assessment & Plan Hot flashes  Bilateral hip pain  Mild intermittent asthma with acute exacerbation      Janeal Holmes, MD [facility]

## 2023-06-11 ENCOUNTER — Ambulatory Visit (INDEPENDENT_AMBULATORY_CARE_PROVIDER_SITE_OTHER): Payer: Medicaid Other | Admitting: Endocrinology

## 2023-06-11 ENCOUNTER — Encounter: Payer: Self-pay | Admitting: Endocrinology

## 2023-06-11 ENCOUNTER — Encounter: Payer: Self-pay | Admitting: Family Medicine

## 2023-06-11 VITALS — BP 148/100 | HR 68 | Resp 16 | Ht 66.5 in | Wt 166.8 lb

## 2023-06-11 DIAGNOSIS — Z7984 Long term (current) use of oral hypoglycemic drugs: Secondary | ICD-10-CM

## 2023-06-11 DIAGNOSIS — E1165 Type 2 diabetes mellitus with hyperglycemia: Secondary | ICD-10-CM | POA: Diagnosis not present

## 2023-06-11 LAB — POCT GLYCOSYLATED HEMOGLOBIN (HGB A1C): Hemoglobin A1C: 9.9 % — AB (ref 4.0–5.6)

## 2023-06-11 LAB — FSH/LH
FSH: 6 m[IU]/mL
LH: 3.4 m[IU]/mL

## 2023-06-11 LAB — TSH RFX ON ABNORMAL TO FREE T4: TSH: 2.55 u[IU]/mL (ref 0.450–4.500)

## 2023-06-11 MED ORDER — METFORMIN HCL ER 500 MG PO TB24: 1000.0000 mg | ORAL_TABLET | Freq: Two times a day (BID) | ORAL | 3 refills | Status: DC

## 2023-06-11 NOTE — Assessment & Plan Note (Signed)
 Uncontrolled.  She is overall comfortable on exam.  Will restart Hyzaar today given no reported interactions between this and her Biktarvy.

## 2023-06-11 NOTE — Progress Notes (Signed)
 Outpatient Endocrinology Note [place] Aeisha Minarik, MD   Patient's Name: Kristin Hart    DOB: [birth year]    MRN: [id number]                                                    REASON OF VISIT: New consult for type 2 diabetes mellitus  REFERRING PROVIDER: Blanchard Kelch, NP  PCP: Evette Georges, MD  HISTORY OF PRESENT ILLNESS:   Kristin Hart is a 43 y.o. old female with past medical history listed below, is here for new consult for type 2 diabetes mellitus.   Pertinent Diabetes History: Patient is referred to endocrinology for uncontrolled type 2 diabetes mellitus, initial consult in June 11, 2023.  Patient was diagnosed with type 2 diabetes mellitus for 10+ years ago, at least from 2013 she is diabetic on metformin.  She had relatively controlled type 2 diabetes mellitus as of 2021.  She had significantly elevated hemoglobin A1c of 11.4% in October 2024 consistent with uncontrolled type 2 diabetes mellitus.  Denies history of diabetes ketoacidosis.  Patient reports history of recurrent vaginal yeast infection.  Patient prefers not to be on injectable antidiabetic medications including insulins due to phobia with needles.  She not aware family history of diabetes mellitus.    Previous diabetes education: no  No personal history of pancreatitis and / or family history of medullary thyroid carcinoma or MEN 2B syndrome.   Chronic Diabetes Complications : Retinopathy: Unknown. Last ophthalmology exam was done on due, was referred to ophthalmology by PCP in December. Nephropathy: no, on ACE/ARB /losartan. Peripheral neuropathy: no Coronary artery disease: no Stroke: no  Relevant comorbidities and cardiovascular risk factors: Obesity: no Body mass index is 26.52 kg/m.  Hypertension: Yes  Hyperlipidemia : Yes, on statin   Current / Home Diabetic regimen includes:  Metformin XR 500 mg 1 tab daily. Glipizide XR 5mg  daily.   Patient had visit with primary care provider yesterday  and metformin and glipizide was increased however patient has not started to take the higher dose of this medication.  Prior diabetic medications:  Glycemic data:    She has not been checking blood sugar lately at home.  She reports she has glucometer and test supplies at home.  Hypoglycemia: Patient has on hypoglycemic episodes. Patient has hypoglycemia awareness.   Factors modifying glucose control: 1.  Diabetic diet assessment: 2 meals a day. Snacks, crackers, chips. Staying away from cakes. Limiting sodas.   2.  Staying active or exercising: walking  3.  Medication compliance: compliant most of the time.  Interval history  Patient presented to establish endocrinology care for uncontrolled diabetes mellitus.  Hemoglobin A1c mildly improved to 9.9% today.  No glucose data to review.  Diabetes regimen as reviewed and noted above.  REVIEW OF SYSTEMS As per history of present illness.   PAST MEDICAL HISTORY: 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, per pt   Depression    Diabetes mellitus without complication (HCC)    type 2   Foreign body in small intestine    GERD (gastroesophageal reflux disease)    HIV (human immunodeficiency virus infection) (HCC)    Hypothyroidism  Stomach ulcer    Substance abuse (HCC)    Meth, IV drug use    PAST SURGICAL HISTORY: Past Surgical History:  Procedure Laterality Date   CESAREAN SECTION N/A 07/04/2016   Procedure: CESAREAN SECTION;  Surgeon: [name], DO;  Location: [facility] BIRTHING SUITES;  Service: Obstetrics;  Laterality: N/A;   ENTEROSCOPY N/A 11/08/2017   Procedure: ENTEROSCOPY;  Surgeon: Hilarie Fredrickson, MD;  Location: [facility] ENDOSCOPY;  Service: Endoscopy;  Laterality: N/A;   NO PAST SURGERIES      ALLERGIES: No Known Allergies  FAMILY HISTORY:  Family History  Problem Relation  Age of Onset   Heart disease Mother    Arthritis Mother    Depression Mother    Bipolar disorder Mother    Cancer Father    Depression Father    Bipolar disorder Father    Alzheimer's disease Father     SOCIAL HISTORY: Social History   Socioeconomic History   Marital status: Single    Spouse name: Not on file   Number of children: 1   Years of education: Not on file   Highest education level: Not on file  Occupational History   Occupation: Unemployed   Tobacco Use   Smoking status: Never   Smokeless tobacco: Never  Vaping Use   Vaping status: Never Used  Substance and Sexual Activity   Alcohol use: No   Drug use: Yes    Types: Methamphetamines, Marijuana    Comment: states she does Crystal almost daily   Sexual activity: Yes    Partners: Male    Birth control/protection: Injection    Comment: pt given condoms and lube  Other Topics Concern   Not on file  Social History Narrative   Are you right handed or left handed? Right   Are you currently employed ? no   What is your current occupation?   Do you live at home alone? no   Who lives with you? Son and mother   What type of home do you live in: 1 story or 2 story? one    Caffeine 1-2 soda   Social Drivers of Health   Financial Resource Strain: High Risk (05/09/2023)   Overall Financial Resource Strain (CARDIA)    Difficulty of Paying Living Expenses: Hard  Food Insecurity: Food Insecurity Present (05/01/2023)   Hunger Vital Sign    Worried About Running Out of Food in the Last Year: Sometimes true    Ran Out of Food in the Last Year: Sometimes true  Transportation Needs: No Transportation Needs (05/01/2023)   PRAPARE - Administrator, Civil Service (Medical): No    Lack of Transportation (Non-Medical): No  Physical Activity: Not on file  Stress: Not on file  Social Connections: Not on file    MEDICATIONS:  Current Outpatient Medications  Medication Sig Dispense Refill   Accu-Chek Softclix  Lancets lancets Use as instructed 100 each 12   acetaminophen (TYLENOL) 500 MG tablet Take 1 tablet (500 mg total) by mouth every 6 (six) hours as needed. 30 tablet 0   albuterol (VENTOLIN HFA) 108 (90 Base) MCG/ACT inhaler INHALE 1 PUFF INTO THE LUNGS EVERY 4 HOURS AS NEEDED. 18 each 2   bictegravir-emtricitabine-tenofovir AF (BIKTARVY) 50-200-25 MG TABS tablet Take 1 tablet by mouth daily. 30 tablet 5   Blood Glucose Monitoring Suppl (BLOOD GLUCOSE MONITOR SYSTEM) w/Device KIT 1 Device by Does not apply route daily before breakfast. 1 kit 0   Blood Glucose Monitoring Suppl  DEVI 1 each by Does not apply route in the morning and at bedtime. May substitute to any manufacturer covered by patient's insurance. 1 each 0   cetirizine (ZYRTEC) 10 MG tablet TAKE 1 TABLET(10 MG) BY MOUTH DAILY 30 tablet 3   cyclobenzaprine (FLEXERIL) 5 MG tablet Take 1 tablet (5 mg total) by mouth at bedtime. (Patient taking differently: Take 5 mg by mouth at bedtime as needed for muscle spasms.) 30 tablet 4   diclofenac Sodium (VOLTAREN) 1 % GEL Apply 4 g topically 4 (four) times daily as needed. 350 g 0   glipiZIDE (GLUCOTROL XL) 10 MG 24 hr tablet Take 1 tablet (10 mg total) by mouth daily. 90 tablet 3   Glucose Blood (BLOOD GLUCOSE TEST STRIPS) STRP 1 each by In Vitro route in the morning, at noon, and at bedtime. May substitute to any manufacturer covered by patient's insurance. 100 strip 0   hydrocortisone cream 1 % Apply to affected area 2 times daily (Patient taking differently: Apply 1 Application topically 2 (two) times daily as needed for itching.) 15 g 0   Lancet Device MISC 1 each by Does not apply route in the morning, at noon, and at bedtime. May substitute to any manufacturer covered by patient's insurance. 1 each 0   Lancets Misc. (ACCU-CHEK SOFTCLIX LANCET DEV) KIT 1 each by Does not apply route daily before breakfast. 1 kit 0   mupirocin ointment (BACTROBAN) 2 % Apply 1 Application topically 2 (two) times  daily. To affected area till better (Patient taking differently: Apply 1 Application topically 2 (two) times daily as needed (wound care). To affected area till better) 22 g 0   naproxen (NAPROSYN) 500 MG tablet Take 1 tablet (500 mg total) by mouth 2 (two) times daily with a meal. (Patient taking differently: Take 500 mg by mouth daily.) 60 tablet 2   nystatin cream (MYCOSTATIN) Apply to affected area 2 times daily (Patient taking differently: Apply 1 Application topically 2 (two) times daily as needed for dry skin.) 30 g 0   omeprazole (PRILOSEC) 40 MG capsule TAKE 1 CAPSULE(40 MG) BY MOUTH DAILY 90 capsule 1   rizatriptan (MAXALT) 10 MG tablet Take 0.5-1 tablets (5-10 mg total) by mouth as needed for migraine. May repeat in 2 hours if needed max dose 20 mg/day 10 tablet 1   topiramate (TOPAMAX) 50 MG tablet Take 1 tablet (50 mg total) by mouth daily. 30 tablet 5   valACYclovir (VALTREX) 500 MG tablet TAKE 1 TABLET(500 MG) BY MOUTH TWICE DAILY 60 tablet 5   losartan-hydrochlorothiazide (HYZAAR) 50-12.5 MG tablet Take 1 tablet by mouth daily. (Patient not taking: Reported on 06/11/2023) 90 tablet 3   metFORMIN (GLUCOPHAGE-XR) 500 MG 24 hr tablet Take 2 tablets (1,000 mg total) by mouth 2 (two) times daily with a meal. 360 tablet 3   Pitavastatin Calcium 2 MG TABS Take 1 tablet (2 mg total) by mouth daily. (Patient not taking: Reported on 06/11/2023) 90 tablet 3   No current facility-administered medications for this visit.    PHYSICAL EXAM: Vitals:   06/11/23 1402  BP: (!) 148/100  Pulse: 68  Resp: 16  Weight: 166 lb 12.8 oz (75.7 kg)  Height: 5' 6.5" (1.689 m)   Body mass index is 26.52 kg/m.  Wt Readings from Last 3 Encounters:  06/11/23 166 lb 12.8 oz (75.7 kg)  06/10/23 159 lb (72.1 kg)  04/29/23 160 lb (72.6 kg)    General: Well developed, well nourished female in no  apparent distress.  HEENT: AT/[place], no external lesions.  Eyes: Conjunctiva clear and no icterus. Neck: Neck supple   Lungs: Respirations not labored Neurologic: Alert, oriented, normal speech Extremities / Skin: Dry. No sores or rashes noted.  Psychiatric: Does not appear depressed or anxious   Diabetic Foot Exam - Simple   No data filed     LABS Reviewed Lab Results  Component Value Date   HGBA1C 9.9 (A) 06/11/2023   HGBA1C 11.2 (A) 03/25/2023   HGBA1C 11.4 (H) 12/11/2022   No results found for: "FRUCTOSAMINE" Lab Results  Component Value Date   CHOL 179 03/14/2023   HDL 50 03/14/2023   LDLCALC 111 (H) 03/14/2023   TRIG 101 03/14/2023   CHOLHDL 3.6 03/14/2023   Lab Results  Component Value Date   MICRALBCREAT 10 03/25/2023   Lab Results  Component Value Date   CREATININE 0.98 03/14/2023   No results found for: "GFR"  ASSESSMENT / PLAN  1. Type 2 diabetes mellitus with hyperglycemia, without long-term current use of insulin (HCC)   2. Uncontrolled type 2 diabetes mellitus with hyperglycemia (HCC)     Diabetes Mellitus type 2, complicated by no known complication. - Diabetic status / severity: Uncontrolled.  Lab Results  Component Value Date   HGBA1C 9.9 (A) 06/11/2023    - Hemoglobin A1c goal : <6.5%  Discussed about type 2 diabetes mellitus and potential chronic complications including diabetic retinopathy, neuropathy and nephropathy.  Discussed about importance of controlling blood sugar to prevent chronic complications.  Discussed about compliance with diabetic medications.  Discussed in detail about diet plan, limiting portion, avoiding high carbohydrate meals including regular sodas.  She does not want to be on injectable antidiabetic medication including insulin and GLP-1 receptor agonist.  She has needle phobia.  - Medications: See below.  At this time she has been taking metformin 500 mg 1 tablet daily and glipizide 5 mg 1 tablet daily.  I) Increase metformin 500 mg extended release 2 tablets daily for 1 week then gradually increase by 1 tablet every week until  taking 4 tablets daily.  II) stay on glipizide extended release 10 mg daily.  Consider adding other oral antidiabetic medications as needed in the future.  Will avoid SGLT2 inhibitor due to recurrent history of vaginal yeast infection.  Blood sugar in the morning fasting and at bedtime.  Bring glucometer in the follow-up visit.  - Home glucose testing: In the morning fasting and at bedtime. - Discussed/ Gave Hypoglycemia treatment plan.  # Consult : Will refer to diabetic educator and dietitian.  # Annual urine for microalbuminuria/ creatinine ratio, no microalbuminuria currently, continue ACE/ARB /losartan. Last  Lab Results  Component Value Date   MICRALBCREAT 10 03/25/2023    # Foot check nightly.  # Annual dilated diabetic eye exams.  Advised to make visit with ophthalmology for diabetic eye exam.  - Diet: Make healthy diabetic food choices, discussed in detail. - Life style / activity / exercise: Discussed.  Advised to walk at least.  2. Blood pressure  -  BP Readings from Last 1 Encounters:  06/11/23 (!) 148/100    - Control is not in target. Monitor at home if still elevated advised to discuss with primary care provider. - No change in current plans.  3. Lipid status / Hyperlipidemia - Last  Lab Results  Component Value Date   LDLCALC 111 (H) 03/14/2023   - Continue pitavastatin 2 mg daily,, managed by primary care provider.  Diagnoses and all  orders for this visit:  Type 2 diabetes mellitus with hyperglycemia, without long-term current use of insulin (HCC) -     POCT HgB A1C -     metFORMIN (GLUCOPHAGE-XR) 500 MG 24 hr tablet; Take 2 tablets (1,000 mg total) by mouth 2 (two) times daily with a meal.  Uncontrolled type 2 diabetes mellitus with hyperglycemia (HCC)    DISPOSITION Follow up in clinic in 3 months suggested.   All questions answered and patient verbalized understanding of the plan.  [place]raq Cuthbert Turton, MD [facility] Endocrinology [facility] [address] [place], [address] [place], [place] [address] Phone # [phone]  At least part of this note was generated using voice recognition software. Inadvertent word errors may have occurred, which were not recognized during the proofreading process.

## 2023-06-11 NOTE — Assessment & Plan Note (Signed)
 Sugars continued to be uncontrolled.  No clinical evidence of DKA today.  Will increase glipizide to 10 mg daily as well as metformin to 1000 mg twice daily given patient's strong preference to avoid insulin and other needle-requiring medications such as GLP-1's.  She is not a candidate for SGLT2 given her high A1c and history of yeast infections.  Discussed also taking her pitavastatin given her diabetes as well as her HIV.

## 2023-06-11 NOTE — Assessment & Plan Note (Signed)
 Taking naproxen once daily for pain.  Discussed long-term GI and renal effects with this medication.  Advised to try to use Tylenol instead.  Continue Voltaren gel.  Can consider sports medicine referral as well in the future.

## 2023-06-11 NOTE — Patient Instructions (Signed)
 Diabetes regimen  Increase metformin 500 mg extended release 2 tablets daily for 1 week then gradually increase by 1 tablet every week until taking 4 tablets daily.  Continue glipizide extended release 10 mg daily.  Blood sugar in the morning fasting and at bedtime.  Bring glucometer in the follow-up visit.

## 2023-06-12 ENCOUNTER — Other Ambulatory Visit: Payer: Self-pay

## 2023-06-12 NOTE — Patient Instructions (Signed)
  Medicaid Managed Care   Unsuccessful Outreach Note  06/12/2023 Name: Kristin Hart MRN: [id number] DOB: Jan 17, 1981  Referred by: Evette Georges, MD Reason for referral : High Risk Managed Medicaid (MM social work unsuccessful telephone outreach )   An unsuccessful telephone outreach was attempted today. The patient was referred to the case management team for assistance with care management and care coordination.   Follow Up Plan: A HIPAA compliant phone message was left for the patient providing contact information and requesting a return call.   Abelino Derrick, MHA [facility]  Managed [facility] Social Worker [phone]

## 2023-06-12 NOTE — Patient Outreach (Signed)
  Medicaid Managed Care   Unsuccessful Outreach Note  06/12/2023 Name: Kristin Hart MRN: [id number] DOB: Jan 17, 1981  Referred by: Evette Georges, MD Reason for referral : High Risk Managed Medicaid (MM social work unsuccessful telephone outreach )   An unsuccessful telephone outreach was attempted today. The patient was referred to the case management team for assistance with care management and care coordination.   Follow Up Plan: A HIPAA compliant phone message was left for the patient providing contact information and requesting a return call.   Abelino Derrick, MHA [facility]  Managed [facility] Social Worker [phone]

## 2023-06-13 ENCOUNTER — Encounter: Payer: Self-pay | Admitting: Family Medicine

## 2023-06-14 ENCOUNTER — Telehealth: Payer: Self-pay

## 2023-06-14 NOTE — Telephone Encounter (Signed)
 Pharmacy Patient Advocate Encounter   Received notification from CoverMyMeds that prior authorization for LIVALO is required/requested.   Insurance verification completed.   The patient is insured through [facility] .   PA required; PA submitted to above mentioned insurance via CoverMyMeds Key/confirmation #/EOC [id number]. Status is pending

## 2023-06-15 ENCOUNTER — Other Ambulatory Visit: Payer: Self-pay | Admitting: Infectious Diseases

## 2023-06-15 DIAGNOSIS — G43009 Migraine without aura, not intractable, without status migrainosus: Secondary | ICD-10-CM

## 2023-06-17 MED ORDER — ROSUVASTATIN CALCIUM 10 MG PO TABS: 10.0000 mg | ORAL_TABLET | Freq: Every day | ORAL | 3 refills | Status: AC

## 2023-06-17 NOTE — Telephone Encounter (Signed)
 Discontinued pitavastatin and sent in Crestor 10 mg daily as statin for use in HIV. No interactions with Biktarvy identified.

## 2023-06-17 NOTE — Addendum Note (Signed)
 Addended by: Evette Georges B on: 06/17/2023 01:16 PM   Modules accepted: Orders

## 2023-06-17 NOTE — Telephone Encounter (Signed)
 Pharmacy Patient Advocate Encounter  Received notification from [facility] that Prior Authorization for PITAVASTATIN [facility]) has been DENIED.  Full denial letter will be uploaded to the media tab. See denial reason below.  We did not see what we need to approve the drug you asked for, (pitavastatin). We may be able to approve this drug when you have tried other drugs first (a trial and failure of two formulary preferred drugs, such as atorvastatin tablet [generic for Lipitor], ezetimibe [generic for Zetia], lovastatin tablet [generic for Mevacor], pravastatin tablet [generic for Pravachol], rosuvastatin tablet [generic for Crestor], simvastatin tablet [generic for Zocor]). We do not see that you have tried these other drugs first, they did not work for you, or they caused you harm.  PA #/Case ID/Reference #: [id number]

## 2023-06-21 ENCOUNTER — Ambulatory Visit: Payer: Self-pay

## 2023-06-21 NOTE — Patient Outreach (Signed)
 Complex Care Management   Visit Note  06/21/2023  Name:  Kristin Hart MRN: [id number] DOB: 1980-11-16  Situation: Referral received for Complex Care Management related to  Diabetes  I obtained verbal consent from Parent.  Visit completed with patient  on the phone  Background:   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, per pt   Depression    Diabetes mellitus without complication (HCC)    type 2   Foreign body in small intestine    GERD (gastroesophageal reflux disease)    HIV (human immunodeficiency virus infection) (HCC)    Hypothyroidism    Stomach ulcer    Substance abuse (HCC)    Meth, IV drug use    Assessment: Patient Reported Symptoms:  Cognitive Cognitive Status: Able to follow simple commands, Alert and oriented to person, place, and time, Insightful and able to interpret abstract concepts   Healing Pattern: Average  Neurological   Neurological Management Strategies: Medication therapy Neurological Self-Management Outcome: 4 (good)  HEENT HEENT Symptoms Reported: No symptoms reported HEENT Self-Management Outcome: 3 (uncertain)    Cardiovascular Cardiovascular Symptoms Reported: No symptoms reported    Respiratory Respiratory Symptoms Reported: No symptoms reported    Endocrine Is patient diabetic?: Yes Is patient checking blood sugars at home?: Yes    Gastrointestinal Gastrointestinal Symptoms Reported: No symptoms reported      Genitourinary      Integumentary Integumentary Symptoms Reported: No symptoms reported    Musculoskeletal Musculoskelatal Symptoms Reviewed: No symptoms reported        Psychosocial              06/10/2023    4:46 PM  Depression screen PHQ 2/9  Decreased Interest 1  Down, Depressed, Hopeless 1  PHQ - 2 Score 2  Altered sleeping 2  Tired, decreased energy 1  Change in  appetite 1  Feeling bad or failure about yourself  1  Trouble concentrating 2  Moving slowly or fidgety/restless 2  Suicidal thoughts 0  PHQ-9 Score 11  Difficult doing work/chores Somewhat difficult    There were no vitals filed for this visit.  Medications Reviewed Today     Reviewed by Juanell Fairly, RN (Registered Nurse) on 06/21/23 at 1512  Med List Status: <None>   Medication Order Taking? Sig Documenting Provider Last Dose Status Informant  Accu-Chek Softclix Lancets lancets [id number] Yes Use as instructed Evette Georges, MD Taking Active   acetaminophen (TYLENOL) 500 MG tablet [id number] Yes Take 1 tablet (500 mg total) by mouth every 6 (six) hours as needed. Debby Freiberg, NP Taking Active Self           Med Note Lyndal Rainbow Jun 17, 2022  3:20 PM) prn  albuterol (VENTOLIN HFA) 108 (90 Base) MCG/ACT inhaler [id number] Yes INHALE 1 PUFF INTO THE LUNGS EVERY 4 HOURS AS NEEDED. Evette Georges, MD Taking Active   bictegravir-emtricitabine-tenofovir AF (BIKTARVY) 50-200-25 MG TABS tablet [id number] Yes Take 1 tablet by mouth daily. Veryl Speak, FNP Taking Active   Blood Glucose Monitoring Suppl (BLOOD GLUCOSE MONITOR SYSTEM) w/Device KIT [id number] Yes 1 Device by Does not apply route daily before breakfast. Evette Georges, MD Taking Active   Blood Glucose Monitoring Suppl DEVI [id number] Yes 1 each by Does not apply route in the morning and at bedtime. May substitute  to any manufacturer covered by patient's insurance. Evette Georges, MD Taking Active   cetirizine (ZYRTEC) 10 MG tablet [id number] Yes TAKE 1 TABLET(10 MG) BY MOUTH DAILY Veryl Speak, FNP Taking Active   cyclobenzaprine (FLEXERIL) 5 MG tablet [id number] Yes Take 1 tablet (5 mg total) by mouth at bedtime.  Patient taking differently: Take 5 mg by mouth at bedtime as needed for muscle spasms.   Veryl Speak, FNP Taking Active Self           Med Note Gunnar Fusi, MELISSA R   Fri Dec 07, 2022  4:41 PM)     diclofenac Sodium (VOLTAREN) 1 % GEL [id number] Yes Apply 4 g topically 4 (four) times daily as needed. Evette Georges, MD Taking Active   glipiZIDE (GLUCOTROL XL) 10 MG 24 hr tablet [id number] Yes Take 1 tablet (10 mg total) by mouth daily. Evette Georges, MD Taking Active   Glucose Blood (BLOOD GLUCOSE TEST STRIPS) STRP [id number]  1 each by In Vitro route in the morning, at noon, and at bedtime. May substitute to any manufacturer covered by patient's insurance. Evette Georges, MD  Active   hydrocortisone cream 1 % [id number] Yes Apply to affected area 2 times daily  Patient taking differently: Apply 1 Application topically 2 (two) times daily as needed for itching.   Henderly, Britni A, PA-C Taking Active Self  Lancet Device MISC [id number] Yes 1 each by Does not apply route in the morning, at noon, and at bedtime. May substitute to any manufacturer covered by patient's insurance. Evette Georges, MD Taking Active   Lancets Misc. (ACCU-CHEK SOFTCLIX LANCET DEV) KIT [id number] Yes 1 each by Does not apply route daily before breakfast. Evette Georges, MD Taking Active   losartan-hydrochlorothiazide [facility]) 50-12.5 MG tablet [id number] Yes Take 1 tablet by mouth daily. Evette Georges, MD Taking Active   metFORMIN (GLUCOPHAGE-XR) 500 MG 24 hr tablet [id number] Yes Take 2 tablets (1,000 mg total) by mouth 2 (two) times daily with a meal. Th[place], [place], MD Taking Active   mupirocin ointment (BACTROBAN) 2 % [id number] Yes Apply 1 Application topically 2 (two) times daily. To affected area till better  Patient taking differently: Apply 1 Application topically 2 (two) times daily as needed (wound care). To affected area till better   Veryl Speak, FNP Taking Active Self  naproxen (NAPROSYN) 500 MG tablet [id number] Yes Take 1 tablet (500 mg total) by mouth 2 (two) times daily with a meal.  Patient taking differently: Take 500 mg by mouth daily.   Veryl Speak, FNP Taking Active Self  nystatin cream (MYCOSTATIN)  [id number] Yes Apply to affected area 2 times daily  Patient taking differently: Apply 1 Application topically 2 (two) times daily as needed for dry skin.   Zenia Resides, MD Taking Active Self  omeprazole (PRILOSEC) 40 MG capsule [id number] Yes TAKE 1 CAPSULE(40 MG) BY MOUTH DAILY Veryl Speak, FNP Taking Active Self  rizatriptan (MAXALT) 10 MG tablet [id number] Yes Take 0.5-1 tablets (5-10 mg total) by mouth as needed for migraine. May repeat in 2 hours if needed max dose 20 mg/day Veryl Speak, FNP Taking Active Self  rosuvastatin (CRESTOR) 10 MG tablet [id number] Yes Take 1 tablet (10 mg total) by mouth daily. Evette Georges, MD Taking Active   topiramate (TOPAMAX) 50 MG tablet [id number] Yes TAKE 1 TABLET(50 MG) BY MOUTH DAILY Veryl Speak, FNP Taking Active   valACYclovir (VALTREX) 500 MG tablet [id number] Yes TAKE 1 TABLET(500 MG) BY MOUTH TWICE  DAILY Veryl Speak, FNP Taking Active             Recommendation:   PCP Follow-up  Follow Up Plan:   Telephone follow-up in 1 month  Juanell Fairly RN, BSN, [facility]  [facility], [facility]  Care Coordinator Phone: [phone]

## 2023-06-21 NOTE — Patient Instructions (Signed)
 Visit Information  Kristin Hart was given information about Medicaid Managed Care team care coordination services as a part of their Healthy [facility] Medicaid benefit. Kristin Hart verbally consented to engagement with the [facility] Managed Care team.   If you are experiencing a medical emergency, please call 911 or report to your local emergency department or urgent care.   If you have a non-emergency medical problem during routine business hours, please contact your provider's office and ask to speak with a nurse.   For questions related to your Healthy [facility] health plan, please call: [phone] or visit the homepage here: [email or web address]  If you would like to schedule transportation through your Healthy [facility] plan, please call the following number at least 2 days in advance of your appointment: [phone]  For information about your ride after you set it up, call Ride Assist at [phone]. Use this number to activate a Will Call pickup, or if your transportation is late for a scheduled pickup. Use this number, too, if you need to make a change or cancel a previously scheduled reservation.  If you need transportation services right away, call [phone]. The after-hours call center is staffed 24 hours to handle ride assistance and urgent reservation requests (including discharges) 365 days a year. Urgent trips include sick visits, hospital discharge requests and life-sustaining treatment.  Call the [facility] Line at [phone], at any time, 24 hours a day, 7 days a week. If you are in danger or need immediate medical attention call 911.  If you would like help to quit smoking, call 1-800-QUIT-NOW (5072667898) OR Espaol: 1-855-Djelo-Ya (4-742-595-6387) o para ms informacin haga clic aqu or Text READY to 564-332 to register via text  Kristin Hart - following are the goals we discussed in your visit today:   Goals  Addressed             This Visit's Progress    VBCI RN Care Plan       Problems:  Chronic Disease Management support and education needs related to DMII  Goal: Over the next 90 days the Patient will attend all scheduled medical appointments: with PCP and specialist as evidenced by keeping al scheduled appointments        demonstrate Ongoing adherence to prescribed treatment plan for DM II as evidenced by no admissions to the hospital verbalize basic understanding of DM II disease process and self health management plan as evidenced by verbal explanation lifestyle changes and consistent medication compliance  Interventions:   Diabetes Interventions: Assessed patient's understanding of A1c goal: <7% Provided education to patient about basic DM disease process Reviewed medications with patient and discussed importance of medication adherence Counseled on importance of regular laboratory monitoring as prescribed Discussed plans with patient for ongoing care management follow up and provided patient with direct contact information for care management team Reviewed scheduled/upcoming provider appointments  Advised patient, providing education and rationale, to check cbg and record, calling pcp for findings outside established parameters Review of patient status, including review of consultants reports, relevant laboratory and other test results, and medications completed Lab Results  Component Value Date   HGBA1C 9.9 (A) 06/11/2023    Patient Self-Care Activities:  Attend all scheduled provider appointments Call pharmacy for medication refills 3-7 days in advance of running out of medications Call provider office for new concerns or questions  Perform all self care activities independently  Perform IADL's (shopping, preparing meals, housekeeping, managing finances) independently Take medications as prescribed  Plan:  Telephone follow up appointment with care management team  member scheduled for:  07/16/23 330 pm             Please see education materials related to Advanced Directive provided by MyChart link.  Patient verbalizes understanding of instructions and care plan provided today and agrees to view in MyChart. Active MyChart status and patient understanding of how to access instructions and care plan via MyChart confirmed with patient.     RN Care Manager will follow up 07/16/23 330 pm  Juanell Fairly RN, BSN, [facility]  [facility], [facility]  Care Coordinator Phone: [phone]      Following is a copy of your plan of care:  There are no care plans that you recently modified to display for this patient.

## 2023-06-27 ENCOUNTER — Other Ambulatory Visit: Payer: Self-pay

## 2023-07-01 ENCOUNTER — Other Ambulatory Visit: Payer: Self-pay

## 2023-07-01 ENCOUNTER — Ambulatory Visit: Admitting: Family

## 2023-07-01 ENCOUNTER — Encounter: Payer: Self-pay | Admitting: Family

## 2023-07-01 VITALS — BP 132/83 | HR 109 | Temp 97.9°F | Wt 159.6 lb

## 2023-07-01 DIAGNOSIS — L237 Allergic contact dermatitis due to plants, except food: Secondary | ICD-10-CM | POA: Insufficient documentation

## 2023-07-01 DIAGNOSIS — F1520 Other stimulant dependence, uncomplicated: Secondary | ICD-10-CM | POA: Diagnosis present

## 2023-07-01 DIAGNOSIS — Z599 Problem related to housing and economic circumstances, unspecified: Secondary | ICD-10-CM

## 2023-07-01 DIAGNOSIS — B20 Human immunodeficiency virus [HIV] disease: Secondary | ICD-10-CM

## 2023-07-01 DIAGNOSIS — F32A Depression, unspecified: Secondary | ICD-10-CM | POA: Diagnosis not present

## 2023-07-01 DIAGNOSIS — Z0000 Encounter for general adult medical examination without abnormal findings: Secondary | ICD-10-CM

## 2023-07-01 MED ORDER — CLOBETASOL PROPIONATE 0.05 % EX CREA: 1.0000 | TOPICAL_CREAM | Freq: Two times a day (BID) | CUTANEOUS | 0 refills | Status: AC

## 2023-07-01 NOTE — Assessment & Plan Note (Signed)
 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 Discussed the risks of continued use. Support provided.

## 2023-07-01 NOTE — Assessment & Plan Note (Addendum)
 Kristin Hart continues to have well controlled virus with good adherence and tolerance to Biktarvy .  Reviewed lab work and discussed plan of care, U equals U, and family planning. Social determinants of health reviewed and interventions indicated. Covered by Medicaid and no problems obtaining medication. Check lab work. Continue current dose of Biktarvy . Plan for follow up in  1 month or sooner if needed with lab work on the same day.Aaron Aas
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
## 2023-07-01 NOTE — Patient Instructions (Addendum)
 Nice to see you.  Bring SQUEEZY BALL for blood work.   Continue to take your medication daily as prescribed.  Refills have been sent to the pharmacy.  Plan for follow up in 1 months or sooner if needed with lab work on the same day.  Have a great day and stay safe!

## 2023-07-01 NOTE — Assessment & Plan Note (Signed)
 PHQ-9 score of 11 indicating moderate depression. Recommend counseling. No suicidal ideations or signs of psychosis.

## 2023-07-01 NOTE — Assessment & Plan Note (Signed)
 Discussed importance of safe sexual practice and condom use. Condoms and site specific STD testing offered.  Vaccinations reviewed  Awaiting further dental care which is up to date.  Cervical cancer screening up to date.

## 2023-07-01 NOTE — Assessment & Plan Note (Signed)
 Symptoms appear consistent with poison ivy and will start clobetasol  cream. Follow up if symptoms worsen or do not improve.
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
## 2023-07-06 ENCOUNTER — Other Ambulatory Visit: Payer: Self-pay | Admitting: Family

## 2023-07-08 ENCOUNTER — Telehealth: Payer: Self-pay | Admitting: *Deleted

## 2023-07-08 NOTE — Progress Notes (Signed)
 Complex Care Management Care Guide Note  07/08/2023 Name: Kristin Hart MRN: [id number] DOB: 12/10/1980  Kristin Hart is a 43 y.o. year old female who is a primary care patient of Mabe, Doroteo Gasmen, MD and is actively engaged with the care management team. I reached out to Madelene Schanz by phone today to assist with re-scheduling  with the BSW.  Follow up plan: Unsuccessful telephone outreach attempt made. A HIPAA compliant phone message was left for the patient providing contact information and requesting a return call.  SIGNATURE

## 2023-07-16 ENCOUNTER — Other Ambulatory Visit: Payer: Self-pay

## 2023-07-16 ENCOUNTER — Other Ambulatory Visit: Payer: Self-pay | Admitting: Family

## 2023-07-19 NOTE — Progress Notes (Signed)
 Complex Care Management Care Guide Note  07/19/2023 Name: BIRDELLA NASSIF MRN: [id number] DOB: Aug 23, 1980  Kristin Hart is a 43 y.o. year old female who is a primary care patient of Mabe, Doroteo Gasmen, MD and is actively engaged with the care management team. I reached out to Madelene Schanz by phone today to assist with re-scheduling  with the BSW.  Follow up plan: Telephone appointment with complex care management team member scheduled for:  5/13  Barnie Bora  [facility]  [facility], [facility] Guide  Direct Dial: [phone]  Fax [phone]

## 2023-07-21 ENCOUNTER — Other Ambulatory Visit: Payer: Self-pay | Admitting: Family

## 2023-07-21 DIAGNOSIS — K219 Gastro-esophageal reflux disease without esophagitis: Secondary | ICD-10-CM

## 2023-07-23 ENCOUNTER — Other Ambulatory Visit: Payer: Self-pay

## 2023-07-23 NOTE — Patient Outreach (Signed)
 Complex Care Management   Visit Note  07/23/2023  Name:  Kristin Hart MRN: [id number] DOB: 10/10/1980  Situation: Referral received for Complex Care Management related to SDOH Barriers:  Housing rent Food insecurity Lack of essential utilities   I obtained verbal consent from Patient.  Visit completed with patient  on the phone  Background:   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, per pt   Depression    Diabetes mellitus without complication (HCC)    type 2   Foreign body in small intestine    GERD (gastroesophageal reflux disease)    HIV (human immunodeficiency virus infection) (HCC)    Hypothyroidism    Stomach ulcer    Substance abuse (HCC)    Meth, IV drug use    Assessment: SW completed a telephone follow up with patient. She confirms she did receieve the resources SW sent. SW provided patient with the information for [facility] for a new refrigerator. No other resources are needed at this time.   Recommendation:   No recommendations at this time.  Follow Up Plan:   Patient has met all care management goals. Care Management case will be closed. Patient has been provided contact information should new needs arise.   Valora Gear, Florestine Hurl, MHA [place]  [facility] Social Worker, Population Health [phone]

## 2023-07-23 NOTE — Patient Instructions (Signed)
 Visit Information  Ms. Vanderheiden was given information about Medicaid Managed Care team care coordination services as a part of their Healthy [facility] Medicaid benefit. Madelene Schanz verbally consented to engagement with the [facility] Managed Care team.   If you are experiencing a medical emergency, please call 911 or report to your local emergency department or urgent care.   If you have a non-emergency medical problem during routine business hours, please contact your provider's office and ask to speak with a nurse.   For questions related to your Healthy [facility] health plan, please call: [phone] or visit the homepage here: [email or web address]  If you would like to schedule transportation through your Healthy [facility] plan, please call the following number at least 2 days in advance of your appointment: [phone]  For information about your ride after you set it up, call Ride Assist at [phone]. Use this number to activate a Will Call pickup, or if your transportation is late for a scheduled pickup. Use this number, too, if you need to make a change or cancel a previously scheduled reservation.  If you need transportation services right away, call [phone]. The after-hours call center is staffed 24 hours to handle ride assistance and urgent reservation requests (including discharges) 365 days a year. Urgent trips include sick visits, hospital discharge requests and life-sustaining treatment.  Call the [facility] Line at [phone], at any time, 24 hours a day, 7 days a week. If you are in danger or need immediate medical attention call 911.  If you would like help to quit smoking, call 1-800-QUIT-NOW (769-420-9296) OR Espaol: 1-855-Djelo-Ya (4-742-595-6387) o para ms informacin haga clic aqu or Text READY to 564-332 to register via text  Ms. Guarisco - following are the goals we discussed in your visit today:   Goals  Addressed             This Visit's Progress    COMPLETED: BSW VBCI Social Work Care Plan       Problems:   Corporate treasurer , Geophysicist/field seismologist , and Housing   CSW Clinical Goal(s):   Over the next 0 No further follow up needed the Patient will Patient will contact resources sent..  Interventions:  Social Determinants of Health in Patient with community resources: SDOH assessments completed: Financial Strain , Food Insecurity , and Housing  Evaluation of current treatment plan related to unmet needs SW mailed resources for food, rent and utilities. Patient did receive the resources. SW also provided information for [employer].  Patient Goals/Self-Care Activities:  Patient will work with the community resources provided.  Plan:   The patient has been provided with contact information for the care management team and has been advised to call with any health related questions or concerns.          The  Patient                                              has been provided with contact information for the Managed Medicaid care management team and has been advised to call with any health related questions or concerns.   Valora Gear, Florestine Hurl, MHA [place]  [facility] Social Worker, Population Health [phone]   Following is a copy of your plan of care:  There are no care plans that you recently modified to display  for this patient.

## 2023-07-25 ENCOUNTER — Encounter (HOSPITAL_COMMUNITY): Payer: Self-pay | Admitting: Emergency Medicine

## 2023-07-25 ENCOUNTER — Other Ambulatory Visit: Payer: Self-pay

## 2023-07-25 ENCOUNTER — Ambulatory Visit (HOSPITAL_COMMUNITY)
Admission: EM | Admit: 2023-07-25 | Discharge: 2023-07-25 | Disposition: A | Discharge status: Home / Self Care | Attending: Internal Medicine | Admitting: Internal Medicine

## 2023-07-25 DIAGNOSIS — L02412 Cutaneous abscess of left axilla: Secondary | ICD-10-CM

## 2023-07-25 MED ORDER — DOXYCYCLINE HYCLATE 100 MG PO CAPS: 100.0000 mg | ORAL_CAPSULE | Freq: Two times a day (BID) | ORAL | 0 refills | Status: AC

## 2023-07-25 MED ORDER — FLUCONAZOLE 150 MG PO TABS: 150.0000 mg | ORAL_TABLET | Freq: Every day | ORAL | 0 refills | Status: AC

## 2023-07-25 MED ORDER — LIDOCAINE-EPINEPHRINE 1 %-1:100000 IJ SOLN
INTRAMUSCULAR | Status: AC
  Filled 2023-07-25: qty 1

## 2023-07-25 NOTE — ED Provider Notes (Signed)
 [facility]    CSN: [id number] Arrival date & time: 07/25/23  1740      History   Chief Complaint Chief Complaint  Patient presents with   Abscess    HPI Kristin Hart is a 43 y.o. female.   43 y.o. female who presents to urgent care with complaints of a left axilla abscess.  This started about a week ago as a small area that has gotten worse.  The area is very tender to palpitation.  She denies any fevers or chills.  She has had abscesses before but not in this area.  She has been trying to treat it with warm compresses and Tylenol  but it has not improved.   Abscess Associated symptoms: no fever and no vomiting     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, per pt   Depression    Diabetes mellitus without complication (HCC)    type 2   Foreign body in small intestine    GERD (gastroesophageal reflux disease)    HIV (human immunodeficiency virus infection) (HCC)    Hypothyroidism    Stomach ulcer    Substance abuse (HCC)    Meth, IV drug use    Patient Active Problem List   Diagnosis Date Noted   Poison ivy 07/01/2023   Infection of left hand 04/24/2023   Dry skin 03/12/2023   Diarrhea 12/12/2022   Lower abdominal pain 05/28/2022   Post-nasal drip 05/28/2022   Plantar fasciitis, right 03/19/2022   Numbness and tingling in both hands 03/19/2022   Poor social situation 10/25/2021   Axillary abscess 09/20/2021   Therapeutic drug monitoring 08/24/2021   Birth control counseling 12/20/2020   Hypertension 09/21/2020   Grief 08/29/2020   Encounter for surveillance of injectable contraceptive 07/29/2020   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   Screening for cervical cancer 04/21/2020   Screening for STDs (sexually transmitted diseases) 04/21/2020   Costochondritis 02/19/2020   Bilateral  hip pain 02/19/2020   Elbow pain, right 12/04/2019   Housing problems 11/23/2019   Mental health disorder 10/12/2019   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   Paresthesias 10/12/2019   Healthcare maintenance 08/05/2019   HIV disease (HCC) 12/31/2018   Anxiety 05/29/2017   Self-mutilation 05/29/2017   Depression 06/07/2016   Marijuana use 06/07/2016   Low vitamin D  level 03/15/2016   Type 2 diabetes mellitus with hyperglycemia (HCC) 02/22/2016   Asthma 02/22/2016   ADD (attention deficit disorder) 02/22/2016   Methamphetamine use disorder, severe, 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/22/2016   Hyperlipidemia with target LDL less than 70 01/31/2012   Migraine 01/31/2012   Tremor, 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    Past Surgical History:  Procedure Laterality Date   CESAREAN SECTION N/A 07/04/2016   Procedure: CESAREAN SECTION;  Surgeon: Malka Sea, DO;  Location: [facility] BIRTHING SUITES;  Service: Obstetrics;  Laterality: N/A;   ENTEROSCOPY N/A 11/08/2017   Procedure: ENTEROSCOPY;  Surgeon: Tobin Forts, MD;  Location: [facility] ENDOSCOPY;  Service: Endoscopy;  Laterality: N/A;   NO PAST SURGERIES      OB History     Gravida  1   Para  1   Term  1   Preterm      AB      Living         SAB  IAB      Ectopic      Multiple  0   Live Births               Home Medications    Prior to Admission medications   Medication Sig Start Date End Date Taking? Authorizing Provider  doxycycline  (VIBRAMYCIN ) 100 MG capsule Take 1 capsule (100 mg total) by mouth 2 (two) times daily for 7 days. 07/25/23 08/01/23 Yes Briceson Broadwater A, PA-C  fluconazole  (DIFLUCAN ) 150 MG tablet Take 1 tablet (150 mg total) by mouth daily for 2 days. take 1 tablet after 1 day on antibiotics and then repeat in 3 days 07/25/23 07/27/23 Yes Yareliz Thorstenson A, PA-C  Accu-Chek Softclix Lancets lancets Use as instructed 06/10/23   Dema Filler, MD  acetaminophen  (TYLENOL ) 500 MG tablet Take 1 tablet (500 mg total) by mouth every  6 (six) hours as needed. 03/06/22   Wedderburn, Ngozi N, NP  albuterol  (VENTOLIN  HFA) 108 (90 Base) MCG/ACT inhaler INHALE 1 PUFF INTO THE LUNGS EVERY 4 HOURS AS NEEDED. 06/10/23   Dema Filler, MD  bictegravir-emtricitabine-tenofovir AF (BIKTARVY ) 50-200-25 MG TABS tablet Take 1 tablet by mouth daily. 04/29/23   Calone, Gregory D, FNP  Blood Glucose Monitoring Suppl (BLOOD GLUCOSE MONITOR SYSTEM) w/Device KIT 1 Device by Does not apply route daily before breakfast. 05/30/23   Dema Filler, MD  Blood Glucose Monitoring Suppl DEVI 1 each by Does not apply route in the morning and at bedtime. May substitute to any manufacturer covered by patient's insurance. 05/30/23   Dema Filler, MD  cetirizine  (ZYRTEC ) 10 MG tablet TAKE 1 TABLET(10 MG) BY MOUTH DAILY 04/15/23   Calone, Gregory D, FNP  clobetasol  cream (TEMOVATE ) 0.05 % Apply 1 Application topically 2 (two) times daily. 07/01/23   Calone, Gregory D, FNP  cyclobenzaprine  (FLEXERIL ) 5 MG tablet Take 1 tablet (5 mg total) by mouth at bedtime. Patient taking differently: Take 5 mg by mouth at bedtime as needed for muscle spasms. 12/21/21   Calone, Gregory D, FNP  diclofenac  Sodium (VOLTAREN ) 1 % GEL Apply 4 g topically 4 (four) times daily as needed. 06/10/23   Dema Filler, MD  glipiZIDE  (GLUCOTROL  XL) 10 MG 24 hr tablet Take 1 tablet (10 mg total) by mouth daily. 06/10/23   Dema Filler, MD  hydrocortisone  cream 1 % Apply to affected area 2 times daily Patient taking differently: Apply 1 Application topically 2 (two) times daily as needed for itching. 08/17/18   Henderly, Britni A, PA-C  Lancets Misc. (ACCU-CHEK SOFTCLIX LANCET DEV) KIT 1 each by Does not apply route daily before breakfast. 05/24/23   Dema Filler, MD  losartan -hydrochlorothiazide  (HYZAAR) 50-12.5 MG tablet Take 1 tablet by mouth daily. 06/10/23   Dema Filler, MD  metFORMIN  (GLUCOPHAGE -XR) 500 MG 24 hr tablet Take 2 tablets (1,000 mg total) by mouth 2 (two) times daily with a meal. 06/11/23   Thapa,  Iraq, MD  mupirocin  ointment (BACTROBAN ) 2 % Apply 1 Application topically 2 (two) times daily. To affected area till better Patient taking differently: Apply 1 Application topically 2 (two) times daily as needed (wound care). To affected area till better 08/02/22   Calone, Gregory D, FNP  naproxen  (NAPROSYN ) 500 MG tablet TAKE 1 TABLET(500 MG) BY MOUTH TWICE DAILY WITH A MEAL 07/08/23   Calone, Gregory D, FNP  nystatin  cream (MYCOSTATIN ) Apply to affected area 2 times daily Patient taking differently: Apply 1 Application topically 2 (two) times daily as needed for dry skin. 06/17/22  Ann Keto, MD  omeprazole  (PRILOSEC) 40 MG capsule TAKE 1 CAPSULE(40 MG) BY MOUTH DAILY 07/22/23   Calone, Gregory D, FNP  rizatriptan  (MAXALT ) 10 MG tablet TAKE 1/2-1 TABLET BY MOUTH AS NEEDED FOR MIGRAINE. MAY REPEAT IN 2 HOURS IF NEEDED. MAX OF 2 TABLETS PER DAY 07/18/23   Calone, Gregory D, FNP  rosuvastatin  (CRESTOR ) 10 MG tablet Take 1 tablet (10 mg total) by mouth daily. 06/17/23   Dema Filler, MD  topiramate  (TOPAMAX ) 50 MG tablet TAKE 1 TABLET(50 MG) BY MOUTH DAILY 06/17/23   Calone, Gregory D, FNP  valACYclovir  (VALTREX ) 500 MG tablet TAKE 1 TABLET(500 MG) BY MOUTH TWICE DAILY 04/30/23   Calone, Gregory D, FNP    Family History Family History  Problem Relation Age of Onset   Heart disease Mother    Arthritis Mother    Depression Mother    Bipolar disorder Mother    Cancer Father    Depression Father    Bipolar disorder Father    Alzheimer's disease Father     Social History Social History   Tobacco Use   Smoking status: Never   Smokeless tobacco: Never  Vaping Use   Vaping status: Some Days  Substance Use Topics   Alcohol use: No   Drug use: Yes    Types: Methamphetamines, Marijuana    Comment: states she does Crystal almost daily     Allergies   Patient has no known allergies.   Review of Systems Review of Systems  Constitutional:  Negative for chills and fever.  HENT:   Negative for ear pain and sore throat.   Eyes:  Negative for pain and visual disturbance.  Respiratory:  Negative for cough and shortness of breath.   Cardiovascular:  Negative for chest pain and palpitations.  Gastrointestinal:  Negative for abdominal pain and vomiting.  Genitourinary:  Negative for dysuria and hematuria.  Musculoskeletal:  Negative for arthralgias and back pain.  Skin:  Positive for color change. Negative for rash.       Abscess left anterior axilla  Neurological:  Negative for seizures and syncope.  All other systems reviewed and are negative.    Physical Exam Triage Vital Signs ED Triage Vitals  Encounter Vitals Group     BP 07/25/23 1808 (!) 134/90     Systolic BP Percentile --      Diastolic BP Percentile --      Pulse Rate 07/25/23 1808 (!) 105     Resp 07/25/23 1808 18     Temp 07/25/23 1808 98.4 F (36.9 C)     Temp Source 07/25/23 1808 Oral     SpO2 07/25/23 1808 98 %     Weight --      Height --      Head Circumference --      Peak Flow --      Pain Score 07/25/23 1805 10     Pain Loc --      Pain Education --      Exclude from Growth Chart --    No data found.  Updated Vital Signs BP (!) 134/90 (BP Location: Right Arm)   Pulse (!) 105   Temp 98.4 F (36.9 C) (Oral)   Resp 18   SpO2 98%   Visual Acuity Right Eye Distance:   Left Eye Distance:   Bilateral Distance:    Right Eye Near:   Left Eye Near:    Bilateral Near:     Physical Exam Vitals and  nursing note reviewed.  Constitutional:      General: She is not in acute distress.    Appearance: She is well-developed.  HENT:     Head: Normocephalic and atraumatic.  Eyes:     Conjunctiva/sclera: Conjunctivae normal.  Cardiovascular:     Rate and Rhythm: Normal rate and regular rhythm.     Heart sounds: No murmur heard. Pulmonary:     Effort: Pulmonary effort is normal. No respiratory distress.     Breath sounds: Normal breath sounds.  Abdominal:     Palpations: Abdomen is  soft.     Tenderness: There is no abdominal tenderness.  Musculoskeletal:        General: No swelling.     Cervical back: Neck supple.  Skin:    General: Skin is warm and dry.     Capillary Refill: Capillary refill takes less than 2 seconds.       Neurological:     Mental Status: She is alert.  Psychiatric:        Mood and Affect: Mood normal.      UC Treatments / Results  Labs (all labs ordered are listed, but only abnormal results are displayed) Labs Reviewed - No data to display  EKG   Radiology No results found.  Procedures Incision and Drainage  Date/Time: 07/25/2023 6:47 PM  Performed by: Kreg Pesa, PA-C Authorized by: Kreg Pesa, PA-C   Consent:    Consent obtained:  Verbal   Consent given by:  Patient   Risks, benefits, and alternatives were discussed: yes     Risks discussed:  Bleeding, incomplete drainage, pain and damage to other organs   Alternatives discussed:  No treatment Universal protocol:    Procedure explained and questions answered to patient or proxy's satisfaction: yes     Relevant documents present and verified: yes     Site/side marked: yes     Immediately prior to procedure, a time out was called: yes     Patient identity confirmed:  Verbally with patient Location:    Type:  Abscess   Location:  Upper extremity   Upper extremity location: left axilla. Pre-procedure details:    Skin preparation:  Betadine Anesthesia:    Anesthesia method:  Local infiltration   Local anesthetic:  Lidocaine  1% WITH epi Procedure type:    Complexity:  Simple Procedure details:    Incision types:  Single straight   Incision depth:  Subcutaneous   Wound management:  Probed and deloculated, irrigated with saline and extensive cleaning   Drainage:  Purulent   Drainage amount:  Copious   Wound treatment:  Wound left open   Packing materials:  1/2 in iodoform gauze Post-procedure details:    Procedure completion:  Tolerated well, no  immediate complications  (including critical care time)  Medications Ordered in UC Medications - No data to display  Initial Impression / Assessment and Plan / UC Course  I have reviewed the triage vital signs and the nursing notes.  Pertinent labs & imaging results that were available during my care of the patient were reviewed by me and considered in my medical decision making (see chart for details).     Abscess of left axilla   After informed consent, incision and drainage of left axilla abscess was performed.  Copious amounts of purulence was expressed from the area.  Half inch iodoform packing was placed.  A pressure dressing was placed due to some persistent oozing.  The patient overall tolerated  the procedure well.  Instructions given to the patient.  We will also start her on doxycycline  100 mg twice daily for 7 days and Diflucan  due to recurrence of yeast infections while on antibiotics.  Patient has to follow-up if needed.  Final Clinical Impressions(s) / UC Diagnoses   Final diagnoses:  Abscess of left axilla     Discharge Instructions      Left axilla abscess drained today. The area was packed with a medicated strip. We have placed a dressing today that you will leave in place until tomorrow.  Tomorrow you may remove the dressing and the packing strip.  Wash the area with soap and water and apply a clean dressing to the area.  Change the dressing twice daily.  You do not need to repack the area.  Start doxycycline  100 mg twice daily for 7 days, this is an antibiotic and you should take it with food.  We have also sent in a prescription for Diflucan  due to recurrence of yeast infection while on antibiotics. Return to urgent care or PCP if symptoms worsen or fail to resolve.     ED Prescriptions     Medication Sig Dispense Auth. Provider   doxycycline  (VIBRAMYCIN ) 100 MG capsule Take 1 capsule (100 mg total) by mouth 2 (two) times daily for 7 days. 14 capsule Nabiha Planck,  Alleen Kehm A, PA-C   fluconazole  (DIFLUCAN ) 150 MG tablet Take 1 tablet (150 mg total) by mouth daily for 2 days. take 1 tablet after 1 day on antibiotics and then repeat in 3 days 2 tablet Kreg Pesa, PA-C      PDMP not reviewed this encounter.   Kreg Pesa, [place] 07/25/23 [phone]

## 2023-07-25 NOTE — ED Triage Notes (Signed)
 Abscess to left axilla.  Noticed a week ago.  Patient has used hot compresses, tylenol .

## 2023-07-25 NOTE — Discharge Instructions (Addendum)
 Left axilla abscess drained today. The area was packed with a medicated strip. We have placed a dressing today that you will leave in place until tomorrow.  Tomorrow you may remove the dressing and the packing strip.  Wash the area with soap and water and apply a clean dressing to the area.  Change the dressing twice daily.  You do not need to repack the area.  Start doxycycline  100 mg twice daily for 7 days, this is an antibiotic and you should take it with food.  We have also sent in a prescription for Diflucan  due to recurrence of yeast infection while on antibiotics. Return to urgent care or PCP if symptoms worsen or fail to resolve, Watch for increased pain, swelling, fevers

## 2023-08-01 ENCOUNTER — Ambulatory Visit: Admitting: Family

## 2023-08-20 ENCOUNTER — Ambulatory Visit: Admitting: Family

## 2023-08-29 ENCOUNTER — Ambulatory Visit: Admitting: Family

## 2023-08-29 ENCOUNTER — Encounter: Payer: Self-pay | Admitting: Family

## 2023-08-29 ENCOUNTER — Other Ambulatory Visit: Payer: Self-pay

## 2023-08-29 VITALS — BP 131/92 | HR 100 | Ht 67.0 in | Wt 156.0 lb

## 2023-08-29 DIAGNOSIS — R110 Nausea: Secondary | ICD-10-CM | POA: Diagnosis not present

## 2023-08-29 DIAGNOSIS — Z3042 Encounter for surveillance of injectable contraceptive: Secondary | ICD-10-CM

## 2023-08-29 DIAGNOSIS — Z0000 Encounter for general adult medical examination without abnormal findings: Secondary | ICD-10-CM

## 2023-08-29 DIAGNOSIS — F1520 Other stimulant dependence, uncomplicated: Secondary | ICD-10-CM | POA: Diagnosis not present

## 2023-08-29 DIAGNOSIS — B20 Human immunodeficiency virus [HIV] disease: Secondary | ICD-10-CM

## 2023-08-29 LAB — POCT URINE PREGNANCY: Preg Test, Ur: NEGATIVE

## 2023-08-29 MED ORDER — ONDANSETRON 4 MG PO TBDP
4.0000 mg | ORAL_TABLET | Freq: Three times a day (TID) | ORAL | 0 refills | Status: DC | PRN
  Filled 2023-08-29: qty 20, 7d supply, fill #0

## 2023-08-29 MED ORDER — MEDROXYPROGESTERONE ACETATE 150 MG/ML IM SUSP
150.0000 mg | Freq: Once | INTRAMUSCULAR | Status: AC
  Administered 2023-08-29: 150 mg via INTRAMUSCULAR

## 2023-08-29 NOTE — Assessment & Plan Note (Signed)
 Discussed importance of safe sexual practice and condom use. Condoms and site specific STD testing offered.  Awaiting dental care with control of her diabetes managed by Endocrinology. Vaccinations reviewed and deferred following counseling.  Cervical cancer screening up to date per recommendations.

## 2023-08-29 NOTE — Assessment & Plan Note (Signed)
 Continues to use methamphetamine and not ready to quit at this time. Counseled on risk of continued use.

## 2023-08-29 NOTE — Assessment & Plan Note (Signed)
 Kristin Hart continues to have well-controlled virus with good adherence and tolerance to Biktarvy .  Reviewed previous lab work and discussed plan of care and U equals U.  Covered by Medicaid with no problems obtaining medication from the pharmacy.  Social determinants of health reviewed and will continue to work with case management to connect with resources as available.  Continue current dose of Biktarvy .  Plan for follow-up in 1 month or sooner if needed with lab work on the same day.

## 2023-08-29 NOTE — Assessment & Plan Note (Signed)
 Kristin Hart has acute nausea with no other symptoms that is possibly may be a prodrome to a migraine headache for which she has taken rizatriptan  in attempts to avoid. Will provide zofran  disintegrating tablets for use as needed.

## 2023-08-29 NOTE — Assessment & Plan Note (Addendum)
 Overdue for depo-provera  injection. Has not had a menstrual cycle since last injection. POCT pregnancy test and if negative will continue with Depo-provera  q 3 months. Counseled to use protection to prevent pregnancy for the next week.

## 2023-08-29 NOTE — Patient Instructions (Addendum)
Nice to see you.  Continue to take your medication daily as prescribed.  Refills have been sent to the pharmacy.  Plan for follow up in 1 months or sooner if needed with lab work on the same day.  Have a great day and stay safe!  

## 2023-08-29 NOTE — Progress Notes (Signed)
 Brief Narrative   Patient ID: Kristin Hart, female    DOB: 03/18/1980, 43 y.o.   MRN: [id number]  Kristin Hart is a 43 y/o caucasian female diagnosed with HIV disease on 01/02/19 with risk factor of heterosexual contact and IV drug use. Initial viral load on 06/23/19 23,300 and CD4 count 577. Genotype with no significant drug resistance. Entered care at [facility] Stage 1. [id number] negative. No history of opportunistic infection. Sole medication regimen of Biktarvy    Subjective:   Chief Complaint  Patient presents with   Follow-up    B20    HPI:  Kristin Hart is a 43 y.o. female with HIV disease last seen on 07/01/2023 with well-controlled virus and good adherence and tolerance to Biktarvy .  Previous lab work with viral load that was undetectable with CD4 count 973.  Kidney function, liver function, electrolytes within normal ranges.  Here today for routine follow-up.  Kristin Hart has been doing okay since her last office visit and continues to take Biktarvy  as prescribed with no adverse side effects or problems obtaining medication from pharmacy.  Covered by Medicaid.  Was happy to find out that her tent was still intact.  Continues to work with case management and will be asking to help with obtaining clothing for her son if able.  Having increased nausea today that started this morning with decreased oral intake of fluids and food.  No vomiting.  No abdominal pain or change in bowel/bladder habits.  Previously experienced a migraine headache a day after the symptoms started and has since taken rizatriptan  to help alleviate any potential headache.  Housing remains stable although not ideal, has access to food and transportation is generally via bus.  Healthcare maintenance reviewed and due for Depo-Provera .  Condoms and site-specific STD testing offered.  Denies fevers, chills, night sweats, headaches, changes in vision, neck pain/stiffness, nausea, diarrhea, vomiting, lesions or rashes.  Lab  Results  Component Value Date   CD4TCELL 61 11/15/2022   CD4TABS 973 11/15/2022   Lab Results  Component Value Date   HIV1RNAQUANT Not Detected 11/15/2022     No Known Allergies    Outpatient Medications Prior to Visit  Medication Sig Dispense Refill   Accu-Chek Softclix Lancets lancets Use as instructed 100 each 12   acetaminophen  (TYLENOL ) 500 MG tablet Take 1 tablet (500 mg total) by mouth every 6 (six) hours as needed. 30 tablet 0   albuterol  (VENTOLIN  HFA) 108 (90 Base) MCG/ACT inhaler INHALE 1 PUFF INTO THE LUNGS EVERY 4 HOURS AS NEEDED. 18 each 2   bictegravir-emtricitabine-tenofovir AF (BIKTARVY ) 50-200-25 MG TABS tablet Take 1 tablet by mouth daily. 30 tablet 5   Blood Glucose Monitoring Suppl (BLOOD GLUCOSE MONITOR SYSTEM) w/Device KIT 1 Device by Does not apply route daily before breakfast. 1 kit 0   Blood Glucose Monitoring Suppl DEVI 1 each by Does not apply route in the morning and at bedtime. May substitute to any manufacturer covered by patient's insurance. 1 each 0   cetirizine  (ZYRTEC ) 10 MG tablet TAKE 1 TABLET(10 MG) BY MOUTH DAILY 30 tablet 3   clobetasol  cream (TEMOVATE ) 0.05 % Apply 1 Application topically 2 (two) times daily. 30 g 0   cyclobenzaprine  (FLEXERIL ) 5 MG tablet Take 1 tablet (5 mg total) by mouth at bedtime. (Patient taking differently: Take 5 mg by mouth at bedtime as needed for muscle spasms.) 30 tablet 4   diclofenac  Sodium (VOLTAREN ) 1 % GEL Apply 4 g topically 4 (four) times daily  as needed. 350 g 0   glipiZIDE  (GLUCOTROL  XL) 10 MG 24 hr tablet Take 1 tablet (10 mg total) by mouth daily. 90 tablet 3   hydrocortisone  cream 1 % Apply to affected area 2 times daily (Patient taking differently: Apply 1 Application topically 2 (two) times daily as needed for itching.) 15 g 0   Lancets Misc. (ACCU-CHEK SOFTCLIX LANCET DEV) KIT 1 each by Does not apply route daily before breakfast. 1 kit 0   losartan -hydrochlorothiazide  (HYZAAR) 50-12.5 MG tablet Take 1  tablet by mouth daily. 90 tablet 3   metFORMIN  (GLUCOPHAGE -XR) 500 MG 24 hr tablet Take 2 tablets (1,000 mg total) by mouth 2 (two) times daily with a meal. 360 tablet 3   mupirocin  ointment (BACTROBAN ) 2 % Apply 1 Application topically 2 (two) times daily. To affected area till better (Patient taking differently: Apply 1 Application topically 2 (two) times daily as needed (wound care). To affected area till better) 22 g 0   naproxen  (NAPROSYN ) 500 MG tablet TAKE 1 TABLET(500 MG) BY MOUTH TWICE DAILY WITH A MEAL 60 tablet 2   nystatin  cream (MYCOSTATIN ) Apply to affected area 2 times daily (Patient taking differently: Apply 1 Application topically 2 (two) times daily as needed for dry skin.) 30 g 0   omeprazole  (PRILOSEC) 40 MG capsule TAKE 1 CAPSULE(40 MG) BY MOUTH DAILY 90 capsule 1   rizatriptan  (MAXALT ) 10 MG tablet TAKE 1/2-1 TABLET BY MOUTH AS NEEDED FOR MIGRAINE. MAY REPEAT IN 2 HOURS IF NEEDED. MAX OF 2 TABLETS PER DAY 10 tablet 1   rosuvastatin  (CRESTOR ) 10 MG tablet Take 1 tablet (10 mg total) by mouth daily. 90 tablet 3   topiramate  (TOPAMAX ) 50 MG tablet TAKE 1 TABLET(50 MG) BY MOUTH DAILY 30 tablet 5   valACYclovir  (VALTREX ) 500 MG tablet TAKE 1 TABLET(500 MG) BY MOUTH TWICE DAILY 60 tablet 5   No facility-administered medications prior to visit.     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, per pt   Depression    Diabetes mellitus without complication (HCC)    type 2   Foreign body in small intestine    GERD (gastroesophageal reflux disease)    HIV (human immunodeficiency virus infection) (HCC)    Hypothyroidism    Stomach ulcer    Substance abuse (HCC)    Meth, IV drug use     Past Surgical History:  Procedure Laterality Date   CESAREAN SECTION N/A 07/04/2016   Procedure: CESAREAN SECTION;  Surgeon: Malka Sea, DO;   Location: [facility] BIRTHING SUITES;  Service: Obstetrics;  Laterality: N/A;   ENTEROSCOPY N/A 11/08/2017   Procedure: ENTEROSCOPY;  Surgeon: Tobin Forts, MD;  Location: [facility] ENDOSCOPY;  Service: Endoscopy;  Laterality: N/A;   NO PAST SURGERIES          Review of Systems  Constitutional:  Negative for appetite change, chills, diaphoresis, fatigue, fever and unexpected weight change.  Eyes:        Negative for acute change in vision  Respiratory:  Negative for chest tightness, shortness of breath and wheezing.   Cardiovascular:  Negative for chest pain.  Gastrointestinal:  Positive for nausea. Negative for diarrhea and vomiting.  Genitourinary:  Negative for dysuria, pelvic pain and vaginal discharge.  Musculoskeletal:  Negative for neck pain and neck stiffness.  Skin:  Negative for  rash.  Neurological:  Positive for headaches. Negative for seizures, syncope and weakness.  Hematological:  Negative for adenopathy. Does not bruise/bleed easily.  Psychiatric/Behavioral:  Negative for hallucinations.      Objective:   BP (!) 131/92   Pulse 100   Ht 5' 7 (1.702 m)   Wt 156 lb (70.8 kg)   SpO2 98%   BMI 24.43 kg/m  Nursing note and vital signs reviewed.  Physical Exam Constitutional:      General: She is not in acute distress.    Appearance: She is well-developed.   Cardiovascular:     Rate and Rhythm: Normal rate and regular rhythm.     Heart sounds: Normal heart sounds.  Pulmonary:     Effort: Pulmonary effort is normal.     Breath sounds: Normal breath sounds.   Skin:    General: Skin is warm and dry.   Neurological:     Mental Status: She is alert and oriented to person, place, and time.   Psychiatric:        Mood and Affect: Mood normal.          08/29/2023    2:01 PM 06/10/2023    4:46 PM 04/29/2023    3:50 PM 03/25/2023    3:26 PM 03/11/2023    4:10 PM  Depression screen PHQ 2/9  Decreased Interest 0 1 0 1 1  Down, Depressed, Hopeless 0 1 1 1 1   PHQ - 2  Score 0 2 1 2 2   Altered sleeping 0 2  1 1   Tired, decreased energy 0 1  1 1   Change in appetite 0 1  1 1   Feeling bad or failure about yourself  0 1  1 1   Trouble concentrating 0 2  1 1   Moving slowly or fidgety/restless 0 2  0 0  Suicidal thoughts 0 0  0 0  PHQ-9 Score 0 11  7 7   Difficult doing work/chores  Somewhat difficult   Somewhat difficult        08/29/2023    2:02 PM 11/29/2021    1:33 PM 05/30/2017    3:02 PM 08/21/2016   11:10 AM  GAD 7 : Generalized Anxiety Score  Nervous, Anxious, on Edge 0 1 2 1   Control/stop worrying 0 1 2 1   Worry too much - different things 0 1 1 1   Trouble relaxing 0 1 1 1   Restless 0 0 1 0  Easily annoyed or irritable 0 1 1 1   Afraid - awful might happen 0 0 0 0  Total GAD 7 Score 0 5 8 5   Anxiety Difficulty   Somewhat difficult      The 10-year ASCVD risk score (Arnett DK, et al., 2019) is: 1.6%   Values used to calculate the score:     Age: 43 years     Clincally relevant sex: Female     Is Non-Hispanic African American: No     Diabetic: Yes     Tobacco smoker: No     Systolic Blood Pressure: 131 mmHg     Is BP treated: Yes     HDL Cholesterol: 50 mg/dL     Total Cholesterol: 179 mg/dL      Assessment & Plan:    Patient Active Problem List   Diagnosis Date Noted   Nausea 08/29/2023   Poison ivy 07/01/2023   Infection of left hand 04/24/2023   Dry skin 03/12/2023   Diarrhea 12/12/2022   Lower abdominal pain  05/28/2022   Post-nasal drip 05/28/2022   Plantar fasciitis, right 03/19/2022   Numbness and tingling in both hands 03/19/2022   Poor social situation 10/25/2021   Axillary abscess 09/20/2021   Therapeutic drug monitoring 08/24/2021   Birth control counseling 12/20/2020   Hypertension 09/21/2020   Grief 08/29/2020   Encounter for surveillance of injectable contraceptive 07/29/2020   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   Screening for cervical cancer 04/21/2020   Screening for STDs (sexually  transmitted diseases) 04/21/2020   Costochondritis 02/19/2020   Bilateral hip pain 02/19/2020   Elbow pain, right 12/04/2019   Housing problems 11/23/2019   Mental health disorder 10/12/2019   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   Paresthesias 10/12/2019   Healthcare maintenance 08/05/2019   HIV disease (HCC) 12/31/2018   Anxiety 05/29/2017   Self-mutilation 05/29/2017   Depression 06/07/2016   Marijuana use 06/07/2016   Low vitamin D  level 03/15/2016   Type 2 diabetes mellitus with hyperglycemia (HCC) 02/22/2016   Asthma 02/22/2016   ADD (attention deficit disorder) 02/22/2016   Methamphetamine use disorder, severe, 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/22/2016   Hyperlipidemia with target LDL less than 70 01/31/2012   Migraine 01/31/2012   Tremor, 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     Problem List Items Addressed This Visit       Other   Methamphetamine use disorder, severe, dependence (HCC)   Continues to use methamphetamine and not ready to quit at this time. Counseled on risk of continued use.      HIV disease (HCC)   Beverlee continues to have well-controlled virus with good adherence and tolerance to Biktarvy .  Reviewed previous lab work and discussed plan of care and U equals U.  Covered by Medicaid with no problems obtaining medication from the pharmacy.  Social determinants of health reviewed and will continue to work with case management to connect with resources as available.  Continue current dose of Biktarvy .  Plan for follow-up in 1 month or sooner if needed with lab work on the same day.      Healthcare maintenance   Discussed importance of safe sexual practice and condom use. Condoms and site specific STD testing offered.  Awaiting dental care with control of her diabetes managed by Endocrinology. Vaccinations reviewed and deferred following counseling.  Cervical cancer screening up to date per recommendations.       Encounter for surveillance of injectable contraceptive - Primary    Overdue for depo-provera  injection. Has not had a menstrual cycle since last injection. POCT pregnancy test and if negative will continue with Depo-provera  q 3 months. Counseled to use protection to prevent pregnancy for the next week.       Relevant Orders   POCT urine pregnancy   Nausea   Kristin Hart has acute nausea with no other symptoms that is possibly may be a prodrome to a migraine headache for which she has taken rizatriptan  in attempts to avoid. Will provide zofran  disintegrating tablets for use as needed.         I am having Kristin Hart start on ondansetron . I am also having her maintain her hydrocortisone  cream, cyclobenzaprine , acetaminophen , nystatin  cream, mupirocin  ointment, cetirizine , valACYclovir , Biktarvy , Accu-Chek Softclix Lancet Dev, Blood Glucose Monitor System, Blood Glucose Monitoring Suppl, glipiZIDE , diclofenac  Sodium, albuterol , Accu-Chek Softclix Lancets, losartan -hydrochlorothiazide , metFORMIN , topiramate , rosuvastatin , clobetasol  cream, naproxen , rizatriptan , and omeprazole . We administered medroxyPROGESTERone .   Meds ordered this encounter  Medications   ondansetron  (ZOFRAN -ODT) 4 MG disintegrating tablet    Sig: Take 1  tablet (4 mg total) by mouth every 8 (eight) hours as needed for nausea or vomiting.    Dispense:  20 tablet    Refill:  0    Please start AR account    Supervising Provider:   Liane Redman [[id number]]   medroxyPROGESTERone  (DEPO-PROVERA ) injection 150 mg     Follow-up: Return in about 1 month (around 09/28/2023). or sooner if needed.    Marlan Silva, MSN, FNP-C Nurse Practitioner [facility] Main number: [phone]

## 2023-09-05 ENCOUNTER — Encounter: Payer: Self-pay | Admitting: Endocrinology

## 2023-09-05 ENCOUNTER — Ambulatory Visit: Payer: Self-pay | Admitting: Endocrinology

## 2023-09-05 ENCOUNTER — Ambulatory Visit: Admitting: Endocrinology

## 2023-09-05 VITALS — BP 138/100 | HR 93 | Resp 20 | Ht 67.0 in | Wt 156.8 lb

## 2023-09-05 DIAGNOSIS — Z7984 Long term (current) use of oral hypoglycemic drugs: Secondary | ICD-10-CM

## 2023-09-05 DIAGNOSIS — E1165 Type 2 diabetes mellitus with hyperglycemia: Secondary | ICD-10-CM

## 2023-09-05 LAB — POCT GLYCOSYLATED HEMOGLOBIN (HGB A1C): Hemoglobin A1C: 10.8 % — AB (ref 4.0–5.6)

## 2023-09-05 MED ORDER — METFORMIN HCL ER 500 MG PO TB24: 1000.0000 mg | ORAL_TABLET | Freq: Every day | ORAL | 3 refills | Status: AC

## 2023-09-05 MED ORDER — PIOGLITAZONE HCL 15 MG PO TABS: 30.0000 mg | ORAL_TABLET | Freq: Every day | ORAL | 3 refills | Status: DC

## 2023-09-05 NOTE — Patient Instructions (Signed)
 Diabetes regimen  Decrease metformin  500 mg extended release 2 tablets daily.  Continue glipizide  extended release 10 mg daily.  Start actos 30 mg daily.    Blood sugar in the morning fasting and at bedtime.  Bring glucometer in the follow-up visit.

## 2023-09-05 NOTE — Progress Notes (Signed)
 Outpatient Endocrinology Note Kristin Gino Garrabrant, MD   Patient's Name: Kristin Hart    DOB: January 10, 1981    MRN: [id number]                                                    REASON OF VISIT: Follow-up for type 2 diabetes mellitus  REFERRING PROVIDER: Melvenia Corean SAILOR, NP  PCP: Tharon Lung, MD  HISTORY OF PRESENT ILLNESS:   Kristin Hart is a 43 y.o. old female with past medical history listed below, is here for follow-up for type 2 diabetes mellitus.   Pertinent Diabetes History: Patient was referred to endocrinology for uncontrolled type 2 diabetes mellitus, initial consult in June 11, 2023.  Patient was diagnosed with type 2 diabetes mellitus for 10+ years ago, at least from 2013 she is diabetic on metformin .  She had relatively controlled type 2 diabetes mellitus as of 2021.  She had significantly elevated hemoglobin A1c of 11.4% in October 2024 consistent with uncontrolled type 2 diabetes mellitus.  Denies history of diabetes ketoacidosis.  Patient reports history of recurrent vaginal yeast infection.  Patient prefers not to be on injectable antidiabetic medications including insulins due to phobia with needles.  She not aware family history of diabetes mellitus.    Previous diabetes education: no  No personal history of pancreatitis and / or family history of medullary thyroid  carcinoma or MEN 2B syndrome.   Chronic Diabetes Complications : Retinopathy: Unknown. Last ophthalmology exam was done on due, was referred to ophthalmology by PCP in December. Nephropathy: no, on ACE/ARB /losartan . Peripheral neuropathy: no Coronary artery disease: no Stroke: no  Relevant comorbidities and cardiovascular risk factors: Obesity: no Body mass index is 24.56 kg/m.  Hypertension: Yes  Hyperlipidemia : Yes, on statin   Current / Home Diabetic regimen includes:  Metformin  XR 500 mg 2 tabs two times daily. Glipizide  XR 10 mg daily.   Prior diabetic medications:  Glycemic data:     She reports she has glucometer and test supplies at home.  She reports she has been checking blood sugars occasionally and she remember 1 time was 147.  No glucometer or glucose data to review in the clinic today.  Hypoglycemia: Patient has on hypoglycemic episodes. Patient has hypoglycemia awareness.   Factors modifying glucose control: 1.  Diabetic diet assessment: 2 meals a day. Snacks, crackers, chips. Staying away from cakes. Limiting sodas.   2.  Staying active or exercising: walking  3.  Medication compliance: compliant most of the time.  Interval history  Hemoglobin A1c worsening 10.8%.  Patient reports she is not fully compliant with taking metformin  especially because of his stomach upset and she sometimes forgets to take the second time in a day.  He usually takes once a day.  She has been taking metformin  2 tablets twice a day.  She reports compliance with taking glipizide .  She has not been checking blood sugar regularly.  She prefer not given injectable diabetic medication.  Blood pressure is high in the clinic today.  Patient reports she has not been fully compliant with blood pressure medication as well.  She denies headache, chest pain, palpitation or shortness of breath.  She also reports she was rushing to come to clinic and lately having stress as well.  REVIEW OF SYSTEMS As per history of present illness.  PAST MEDICAL HISTORY: 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, per pt   Depression    Diabetes mellitus without complication (HCC)    type 2   Foreign body in small intestine    GERD (gastroesophageal reflux disease)    HIV (human immunodeficiency virus infection) (HCC)    Hypothyroidism    Stomach ulcer    Substance abuse (HCC)    Meth, IV drug use    PAST SURGICAL HISTORY: Past Surgical History:   Procedure Laterality Date   CESAREAN SECTION N/A 07/04/2016   Procedure: CESAREAN SECTION;  Surgeon: Lang JINNY Peel, DO;  Location: [facility];  Service: Obstetrics;  Laterality: N/A;   ENTEROSCOPY N/A 11/08/2017   Procedure: ENTEROSCOPY;  Surgeon: Abran Norleen SAILOR, MD;  Location[facility] ENDOSCOPY;  Service: Endoscopy;  Laterality: N/A;   NO PAST SURGERIES      ALLERGIES: No Known Allergies  FAMILY HISTORY:  Family History  Problem Relation Age of Onset   Heart disease Mother    Arthritis Mother    Depression Mother    Bipolar disorder Mother    Cancer Father    Depression Father    Bipolar disorder Father    Alzheimer's disease Father     SOCIAL HISTORY: Social History   Socioeconomic History   Marital status: Single    Spouse name: Not on file   Number of children: 1   Years of education: Not on file   Highest education level: Not on file  Occupational History   Occupation: Unemployed   Tobacco Use   Smoking status: Never   Smokeless tobacco: Never  Vaping Use   Vaping status: Some Days  Substance and Sexual Activity   Alcohol use: No   Drug use: Yes    Types: Methamphetamines, Marijuana    Comment: states she does Crystal almost daily   Sexual activity: Yes    Partners: Male    Birth control/protection: Injection    Comment: pt given condoms and lube  Other Topics Concern   Not on file  Social History Narrative   Are you right handed or left handed? Right   Are you currently employed ? no   What is your current occupation?   Do you live at home alone? no   Who lives with you? Son and mother   What type of home do you live in: 1 story or 2 story? one    Caffeine 1-2 soda   Social Drivers of Health   Financial Resource Strain: Medium Risk (07/23/2023)   Overall Financial Resource Strain (CARDIA)    Difficulty of Paying Living Expenses: Somewhat hard  Food Insecurity: Food Insecurity Present (07/23/2023)   Hunger Vital Sign    Worried About Running  Out of Food in the Last Year: Sometimes true    Ran Out of Food in the Last Year: Sometimes true  Transportation Needs: No Transportation Needs (05/01/2023)   PRAPARE - Administrator, Civil Service (Medical): No    Lack of Transportation (Non-Medical): No  Physical Activity: Not on file  Stress: Not on file  Social Connections: Not on file    MEDICATIONS:  Current Outpatient Medications  Medication Sig Dispense Refill   Accu-Chek Softclix Lancets lancets Use as instructed 100 each 12   acetaminophen  (TYLENOL ) 500 MG tablet Take 1 tablet (500  mg total) by mouth every 6 (six) hours as needed. 30 tablet 0   albuterol  (VENTOLIN  HFA) 108 (90 Base) MCG/ACT inhaler INHALE 1 PUFF INTO THE LUNGS EVERY 4 HOURS AS NEEDED. 18 each 2   bictegravir-emtricitabine-tenofovir AF (BIKTARVY ) 50-200-25 MG TABS tablet Take 1 tablet by mouth daily. 30 tablet 5   Blood Glucose Monitoring Suppl (BLOOD GLUCOSE MONITOR SYSTEM) w/Device KIT 1 Device by Does not apply route daily before breakfast. 1 kit 0   Blood Glucose Monitoring Suppl DEVI 1 each by Does not apply route in the morning and at bedtime. May substitute to any manufacturer covered by patient's insurance. 1 each 0   cetirizine  (ZYRTEC ) 10 MG tablet TAKE 1 TABLET(10 MG) BY MOUTH DAILY 30 tablet 3   clobetasol  cream (TEMOVATE ) 0.05 % Apply 1 Application topically 2 (two) times daily. 30 g 0   cyclobenzaprine  (FLEXERIL ) 5 MG tablet Take 1 tablet (5 mg total) by mouth at bedtime. 30 tablet 4   diclofenac  Sodium (VOLTAREN ) 1 % GEL Apply 4 g topically 4 (four) times daily as needed. 350 g 0   glipiZIDE  (GLUCOTROL  XL) 10 MG 24 hr tablet Take 1 tablet (10 mg total) by mouth daily. 90 tablet 3   hydrocortisone  cream 1 % Apply to affected area 2 times daily 15 g 0   Lancets Misc. (ACCU-CHEK SOFTCLIX LANCET DEV) KIT 1 each by Does not apply route daily before breakfast. 1 kit 0   losartan -hydrochlorothiazide  (HYZAAR) 50-12.5 MG tablet Take 1 tablet by  mouth daily. 90 tablet 3   mupirocin  ointment (BACTROBAN ) 2 % Apply 1 Application topically 2 (two) times daily. To affected area till better 22 g 0   naproxen  (NAPROSYN ) 500 MG tablet TAKE 1 TABLET(500 MG) BY MOUTH TWICE DAILY WITH A MEAL 60 tablet 2   nystatin  cream (MYCOSTATIN ) Apply to affected area 2 times daily 30 g 0   omeprazole  (PRILOSEC) 40 MG capsule TAKE 1 CAPSULE(40 MG) BY MOUTH DAILY 90 capsule 1   ondansetron  (ZOFRAN -ODT) 4 MG disintegrating tablet Take 1 tablet (4 mg total) by mouth every 8 (eight) hours as needed for nausea or vomiting. 20 tablet 0   pioglitazone (ACTOS) 15 MG tablet Take 2 tablets (30 mg total) by mouth daily. 90 tablet 3   rizatriptan  (MAXALT ) 10 MG tablet TAKE 1/2-1 TABLET BY MOUTH AS NEEDED FOR MIGRAINE. MAY REPEAT IN 2 HOURS IF NEEDED. MAX OF 2 TABLETS PER DAY 10 tablet 1   rosuvastatin  (CRESTOR ) 10 MG tablet Take 1 tablet (10 mg total) by mouth daily. 90 tablet 3   topiramate  (TOPAMAX ) 50 MG tablet TAKE 1 TABLET(50 MG) BY MOUTH DAILY 30 tablet 5   valACYclovir  (VALTREX ) 500 MG tablet TAKE 1 TABLET(500 MG) BY MOUTH TWICE DAILY 60 tablet 5   metFORMIN  (GLUCOPHAGE -XR) 500 MG 24 hr tablet Take 2 tablets (1,000 mg total) by mouth daily with breakfast. 180 tablet 3   No current facility-administered medications for this visit.    PHYSICAL EXAM: Vitals:   09/05/23 1450 09/05/23 1451  BP: (!) 158/100 (!) 138/100  Pulse: 93   Resp: 20   SpO2: 97%   Weight: 156 lb 12.8 oz (71.1 kg)   Height: 5' 7 (1.702 m)    Body mass index is 24.56 kg/m.  Wt Readings from Last 3 Encounters:  09/05/23 156 lb 12.8 oz (71.1 kg)  08/29/23 156 lb (70.8 kg)  07/01/23 159 lb 9.6 oz (72.4 kg)    General: Well developed, well nourished female in  no apparent distress.  HEENT: AT/[place], no external lesions.  Eyes: Conjunctiva clear and no icterus. Neck: Neck supple  Lungs: Respirations not labored Neurologic: Alert, oriented, normal speech Extremities / Skin: Dry.   Psychiatric: Does not appear depressed or anxious   Diabetic Foot Exam - Simple   No data filed     LABS Reviewed Lab Results  Component Value Date   HGBA1C 10.8 (A) 09/05/2023   HGBA1C 9.9 (A) 06/11/2023   HGBA1C 11.2 (A) 03/25/2023   No results found for: FRUCTOSAMINE Lab Results  Component Value Date   CHOL 179 03/14/2023   HDL 50 03/14/2023   LDLCALC 111 (H) 03/14/2023   TRIG 101 03/14/2023   CHOLHDL 3.6 03/14/2023   Lab Results  Component Value Date   MICRALBCREAT 10 03/25/2023   Lab Results  Component Value Date   CREATININE 0.98 03/14/2023   No results found for: GFR  ASSESSMENT / PLAN  1. Type 2 diabetes mellitus with hyperglycemia, without long-term current use of insulin (HCC)      Diabetes Mellitus type 2, complicated by no known complication. - Diabetic status / severity: Uncontrolled.  Worsening.  Lab Results  Component Value Date   HGBA1C 10.8 (A) 09/05/2023    - Hemoglobin A1c goal : <6.5%  Discussed about type 2 diabetes mellitus and potential chronic complications including diabetic retinopathy, neuropathy and nephropathy.  Discussed about importance of controlling blood sugar to prevent chronic complications.  Discussed about compliance with diabetic medications.  Discussed in detail about diet plan, limiting portion, avoiding high carbohydrate meals including regular sodas.  She does not want to be on injectable antidiabetic medication including insulin and GLP-1 receptor agonist.  She has needle phobia.  I will avoid SGLT2 inhibitor due to recurrent yeast infection.  Adjusted diabetes regimen as follows.  - Medications: See below.    I) decrease metformin  500 mg extended release 2 tablets twice a day to 2 tablets daily.  Advised to take with food to minimize GI upset. II) continue glipizide  extended release 10 mg daily. III) start pioglitazone 30 mg daily.  Blood sugar in the morning fasting and at bedtime.  Asked to bring  glucometer in the follow-up visit.  - Home glucose testing: In the morning fasting and at bedtime. - Discussed/ Gave Hypoglycemia treatment plan.  # Consult : Will refer to diabetic educator and dietitian.  # Annual urine for microalbuminuria/ creatinine ratio, no microalbuminuria currently, continue ACE/ARB /losartan . Last  Lab Results  Component Value Date   MICRALBCREAT 10 03/25/2023    # Foot check nightly.  # Annual dilated diabetic eye exams.  Advised to make visit with ophthalmology for diabetic eye exam.  - Diet: Make healthy diabetic food choices, discussed in detail. - Life style / activity / exercise: Discussed.  Advised to walk at least.  2. Blood pressure  -  BP Readings from Last 1 Encounters:  09/05/23 (!) 138/100    - Control is not in target. Monitor at home if still elevated advised to discuss with primary care provider.  She reports she has blood pressure machine at home.  Discussed about compliance with blood pressure medication as well. - No change in current plans.  3. Lipid status / Hyperlipidemia - Last  Lab Results  Component Value Date   LDLCALC 111 (H) 03/14/2023   - Continue pitavastatin  2 mg daily,, managed by primary care provider.  Diagnoses and all orders for this visit:  Type 2 diabetes mellitus with hyperglycemia, without long-term  current use of insulin (HCC) -     POCT glycosylated hemoglobin (Hb A1C) -     metFORMIN  (GLUCOPHAGE -XR) 500 MG 24 hr tablet; Take 2 tablets (1,000 mg total) by mouth daily with breakfast. -     pioglitazone (ACTOS) 15 MG tablet; Take 2 tablets (30 mg total) by mouth daily.    DISPOSITION Follow up in clinic in 3 months suggested.   All questions answered and patient verbalized understanding of the plan.  Kristin Paiden Caraveo, MD [facility] Endocrinology [facility] [address] [place], [address] [place], [place] [address] Phone # [phone]  At least part of this note was generated using voice  recognition software. Inadvertent word errors may have occurred, which were not recognized during the proofreading process.

## 2023-09-23 ENCOUNTER — Ambulatory Visit: Admitting: Family

## 2023-10-02 ENCOUNTER — Other Ambulatory Visit: Payer: Self-pay

## 2023-10-02 ENCOUNTER — Encounter (HOSPITAL_COMMUNITY): Payer: Self-pay | Admitting: *Deleted

## 2023-10-02 ENCOUNTER — Telehealth: Payer: Self-pay

## 2023-10-02 ENCOUNTER — Ambulatory Visit (HOSPITAL_COMMUNITY)
Admission: EM | Admit: 2023-10-02 | Discharge: 2023-10-02 | Disposition: A | Discharge status: Home / Self Care | Attending: Emergency Medicine | Admitting: Emergency Medicine

## 2023-10-02 DIAGNOSIS — R195 Other fecal abnormalities: Secondary | ICD-10-CM | POA: Insufficient documentation

## 2023-10-02 DIAGNOSIS — R739 Hyperglycemia, unspecified: Secondary | ICD-10-CM | POA: Diagnosis not present

## 2023-10-02 DIAGNOSIS — R1084 Generalized abdominal pain: Secondary | ICD-10-CM | POA: Insufficient documentation

## 2023-10-02 LAB — POCT URINE PREGNANCY: Preg Test, Ur: NEGATIVE

## 2023-10-02 LAB — CBC
HCT: 39.5 % (ref 36.0–46.0)
Hemoglobin: 12.4 g/dL (ref 12.0–15.0)
MCH: 27 pg (ref 26.0–34.0)
MCHC: 31.4 g/dL (ref 30.0–36.0)
MCV: 85.9 fL (ref 80.0–100.0)
Platelets: 264 K/uL (ref 150–400)
RBC: 4.6 MIL/uL (ref 3.87–5.11)
RDW: 13.7 % (ref 11.5–15.5)
WBC: 5.9 K/uL (ref 4.0–10.5)
nRBC: 0 % (ref 0.0–0.2)

## 2023-10-02 LAB — POCT URINALYSIS DIP (MANUAL ENTRY)
Bilirubin, UA: NEGATIVE
Blood, UA: NEGATIVE
Glucose, UA: >=1000 mg/dL — AB
Leukocytes, UA: NEGATIVE
Nitrite, UA: NEGATIVE
Spec Grav, UA: >=1.03 — AB (ref 1.010–1.025)
Urobilinogen, UA: 1 U/dL
pH, UA: 5 (ref 5.0–8.0)

## 2023-10-02 LAB — COMPREHENSIVE METABOLIC PANEL WITH GFR
ALT: 16 U/L (ref 0–44)
AST: 18 U/L (ref 15–41)
Albumin: 3.4 g/dL — ABNORMAL LOW (ref 3.5–5.0)
Alkaline Phosphatase: 59 U/L (ref 38–126)
Anion gap: 8 (ref 5–15)
BUN: 16 mg/dL (ref 6–20)
CO2: 21 mmol/L — ABNORMAL LOW (ref 22–32)
Calcium: 9.1 mg/dL (ref 8.9–10.3)
Chloride: 104 mmol/L (ref 98–111)
Creatinine, Ser: 0.92 mg/dL (ref 0.44–1.00)
GFR, Estimated: >60 mL/min (ref >60)
Glucose, Bld: 324 mg/dL — ABNORMAL HIGH (ref 70–99)
Potassium: 3.4 mmol/L — ABNORMAL LOW (ref 3.5–5.1)
Sodium: 133 mmol/L — ABNORMAL LOW (ref 135–145)
Total Bilirubin: 0.5 mg/dL (ref 0.0–1.2)
Total Protein: 6.2 g/dL — ABNORMAL LOW (ref 6.5–8.1)

## 2023-10-02 LAB — POC HEMOCCULT BLD/STL (OFFICE/1-CARD/DIAGNOSTIC): Fecal Occult Blood, POC: NEGATIVE

## 2023-10-02 LAB — POCT FASTING CBG KUC MANUAL ENTRY: POCT Glucose (KUC): 332 mg/dL — AB (ref 70–99)

## 2023-10-02 NOTE — ED Provider Notes (Signed)
 [facility]    CSN: [id number] Arrival date & time: 10/02/23  1543      History   Chief Complaint Chief Complaint  Patient presents with   Dark Stools   Abdominal Pain    HPI Kristin Hart is a 43 y.o. female.   Patient presents with concerns for dark stools that began today.  Patient states that she has had 4-5 episodes of dark-colored diarrhea since waking this morning.  Patient states that the stool is so dark that it almost appears to be black.  Patient denies any obvious presence of blood in her stool.  Patient states that she did take Imodium prior to arrival here and reports that this has been helping with the diarrhea.  Patient also reports generalized abdominal pain that began this morning as well.  Denies nausea, vomiting, urinary symptoms, fever, and weakness.  Patient states that she does have a history of GERD and stomach ulcers and takes omeprazole .  Patient denies any recent indigestion or reflux.  Patient also has a history of type 2 diabetes.  Patient states that she does take metformin  and glipizide  as prescribed.  Patient states that she was also recently prescribed pioglitazone  as well and reports that she has been taking this as prescribed.  Of note patient also has HIV and reports that she does take her Biktarvy  as prescribed.  Patient also has a history of hypothyroidism and substance abuse.  Patient reports that she did use meth 2 days ago.  Patient states that she does take naproxen  daily for chronic pain.  Patient states that she does not take more than 500 mg of this daily.  Patient denies taking any other NSAIDs or blood thinners.  Patient also denies alcohol use.  The history is provided by the patient and medical records.  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, per pt    Depression    Diabetes mellitus without complication (HCC)    type 2   Foreign body in small intestine    GERD (gastroesophageal reflux disease)    HIV (human immunodeficiency virus infection) (HCC)    Hypothyroidism    Stomach ulcer    Substance abuse (HCC)    Meth, IV drug use    Patient Active Problem List   Diagnosis Date Noted   Nausea 08/29/2023   Poison ivy 07/01/2023   Infection of left hand 04/24/2023   Dry skin 03/12/2023   Diarrhea 12/12/2022   Lower abdominal pain 05/28/2022   Post-nasal drip 05/28/2022   Plantar fasciitis, right 03/19/2022   Numbness and tingling in both hands 03/19/2022   Poor social situation 10/25/2021   Axillary abscess 09/20/2021   Therapeutic drug monitoring 08/24/2021   Birth control counseling 12/20/2020   Hypertension 09/21/2020   Grief 08/29/2020   Encounter for surveillance of injectable contraceptive 07/29/2020   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   Screening for cervical cancer 04/21/2020   Screening for STDs (sexually transmitted diseases) 04/21/2020   Costochondritis 02/19/2020   Bilateral hip pain 02/19/2020   Elbow pain, right 12/04/2019   Housing problems 11/23/2019   Mental health disorder 10/12/2019   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   Paresthesias 10/12/2019   Healthcare maintenance 08/05/2019   HIV disease (HCC) 12/31/2018   Anxiety 05/29/2017  Self-mutilation 05/29/2017   Depression 06/07/2016   Marijuana use 06/07/2016   Low vitamin D  level 03/15/2016   Type 2 diabetes mellitus with hyperglycemia (HCC) 02/22/2016   Asthma 02/22/2016   ADD (attention deficit disorder) 02/22/2016   Methamphetamine use disorder, severe, 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/22/2016   Hyperlipidemia with target LDL less than 70 01/31/2012   Migraine 01/31/2012   Tremor, 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    Past Surgical History:  Procedure Laterality Date   CESAREAN SECTION N/A 07/04/2016   Procedure: CESAREAN SECTION;  Surgeon:  Lang JINNY Peel, DO;  Location: [facility] BIRTHING SUITES;  Service: Obstetrics;  Laterality: N/A;   ENTEROSCOPY N/A 11/08/2017   Procedure: ENTEROSCOPY;  Surgeon: Abran Norleen SAILOR, MD;  Location: [facility] ENDOSCOPY;  Service: Endoscopy;  Laterality: N/A;   NO PAST SURGERIES      OB History     Gravida  1   Para  1   Term  1   Preterm      AB      Living         SAB      IAB      Ectopic      Multiple  0   Live Births               Home Medications    Prior to Admission medications   Medication Sig Start Date End Date Taking? Authorizing Provider  bictegravir-emtricitabine-tenofovir AF (BIKTARVY ) 50-200-25 MG TABS tablet Take 1 tablet by mouth daily. 04/29/23  Yes Calone, Gregory D, FNP  cetirizine  (ZYRTEC ) 10 MG tablet TAKE 1 TABLET(10 MG) BY MOUTH DAILY 04/15/23  Yes Calone, Gregory D, FNP  cyclobenzaprine  (FLEXERIL ) 5 MG tablet Take 1 tablet (5 mg total) by mouth at bedtime. 12/21/21  Yes Calone, Gregory D, FNP  diclofenac  Sodium (VOLTAREN ) 1 % GEL Apply 4 g topically 4 (four) times daily as needed. 06/10/23  Yes Mabe, Elna, MD  glipiZIDE  (GLUCOTROL  XL) 10 MG 24 hr tablet Take 1 tablet (10 mg total) by mouth daily. 06/10/23  Yes Mabe, Elna, MD  losartan -hydrochlorothiazide  (HYZAAR) 50-12.5 MG tablet Take 1 tablet by mouth daily. 06/10/23  Yes Tharon Elna, MD  metFORMIN  (GLUCOPHAGE -XR) 500 MG 24 hr tablet Take 2 tablets (1,000 mg total) by mouth daily with breakfast. 09/05/23  Yes Thapa, [place], MD  naproxen  (NAPROSYN ) 500 MG tablet TAKE 1 TABLET(500 MG) BY MOUTH TWICE DAILY WITH A MEAL 07/08/23  Yes Calone, Gregory D, FNP  omeprazole  (PRILOSEC) 40 MG capsule TAKE 1 CAPSULE(40 MG) BY MOUTH DAILY 07/22/23  Yes Calone, Gregory D, FNP  pioglitazone  (ACTOS ) 15 MG tablet Take 2 tablets (30 mg total) by mouth daily. 09/05/23  Yes Thapa, [place], MD  rosuvastatin  (CRESTOR ) 10 MG tablet Take 1 tablet (10 mg total) by mouth daily. 06/17/23  Yes Mabe, Elna, MD  topiramate  (TOPAMAX ) 50 MG tablet  TAKE 1 TABLET(50 MG) BY MOUTH DAILY 06/17/23  Yes Calone, Gregory D, FNP  valACYclovir  (VALTREX ) 500 MG tablet TAKE 1 TABLET(500 MG) BY MOUTH TWICE DAILY 04/30/23  Yes Calone, Gregory D, FNP  Accu-Chek Softclix Lancets lancets Use as instructed 06/10/23   Tharon Elna, MD  acetaminophen  (TYLENOL ) 500 MG tablet Take 1 tablet (500 mg total) by mouth every 6 (six) hours as needed. 03/06/22   Wedderburn, Ngozi N, NP  albuterol  (VENTOLIN  HFA) 108 (90 Base) MCG/ACT inhaler INHALE 1 PUFF INTO THE LUNGS EVERY 4 HOURS AS NEEDED. 06/10/23   Tharon Elna, MD  Blood Glucose Monitoring Suppl (BLOOD GLUCOSE MONITOR SYSTEM)  w/Device KIT 1 Device by Does not apply route daily before breakfast. 05/30/23   Tharon Lung, MD  Blood Glucose Monitoring Suppl DEVI 1 each by Does not apply route in the morning and at bedtime. May substitute to any manufacturer covered by patient's insurance. 05/30/23   Tharon Lung, MD  clobetasol  cream (TEMOVATE ) 0.05 % Apply 1 Application topically 2 (two) times daily. 07/01/23   Calone, Gregory D, FNP  hydrocortisone  cream 1 % Apply to affected area 2 times daily 08/17/18   Henderly, Britni A, PA-C  Lancets Misc. (ACCU-CHEK SOFTCLIX LANCET DEV) KIT 1 each by Does not apply route daily before breakfast. 05/24/23   Tharon Lung, MD  mupirocin  ointment (BACTROBAN ) 2 % Apply 1 Application topically 2 (two) times daily. To affected area till better 08/02/22   Calone, Gregory D, FNP  nystatin  cream (MYCOSTATIN ) Apply to affected area 2 times daily 06/17/22   Banister, Pamela K, MD  ondansetron  (ZOFRAN -ODT) 4 MG disintegrating tablet Take 1 tablet (4 mg total) by mouth every 8 (eight) hours as needed for nausea or vomiting. 08/29/23   Calone, Gregory D, FNP  rizatriptan  (MAXALT ) 10 MG tablet TAKE 1/2-1 TABLET BY MOUTH AS NEEDED FOR MIGRAINE. MAY REPEAT IN 2 HOURS IF NEEDED. MAX OF 2 TABLETS PER DAY 07/18/23   Calone, Gregory D, FNP    Family History Family History  Problem Relation Age of Onset   Heart  disease Mother    Arthritis Mother    Depression Mother    Bipolar disorder Mother    Cancer Father    Depression Father    Bipolar disorder Father    Alzheimer's disease Father     Social History Social History   Tobacco Use   Smoking status: Former    Types: Cigarettes   Smokeless tobacco: Never  Vaping Use   Vaping status: Some Days  Substance Use Topics   Alcohol use: No   Drug use: Yes    Types: Methamphetamines, Marijuana    Comment: states she does Crystal meth -- last used 2 days ago     Allergies   Patient has no known allergies.   Review of Systems Review of Systems  Gastrointestinal:  Positive for abdominal pain.   Per HPI  Physical Exam Triage Vital Signs ED Triage Vitals  Encounter Vitals Group     BP 10/02/23 1555 118/76     Girls Systolic BP Percentile --      Girls Diastolic BP Percentile --      Boys Systolic BP Percentile --      Boys Diastolic BP Percentile --      Pulse Rate 10/02/23 1555 (!) 103     Resp 10/02/23 1555 18     Temp 10/02/23 1555 97.6 F (36.4 C)     Temp Source 10/02/23 1555 Oral     SpO2 10/02/23 1555 98 %     Weight --      Height --      Head Circumference --      Peak Flow --      Pain Score 10/02/23 1556 5     Pain Loc --      Pain Education --      Exclude from Growth Chart --    No data found.  Updated Vital Signs BP 118/76   Pulse (!) 103   Temp 97.6 F (36.4 C) (Oral)   Resp 18   SpO2 98%   Visual Acuity Right Eye Distance:  Left Eye Distance:   Bilateral Distance:    Right Eye Near:   Left Eye Near:    Bilateral Near:     Physical Exam Vitals and nursing note reviewed.  Constitutional:      General: She is awake. She is not in acute distress.    Appearance: Normal appearance. She is well-developed and well-groomed. She is not ill-appearing.  Cardiovascular:     Rate and Rhythm: Normal rate and regular rhythm.  Pulmonary:     Effort: Pulmonary effort is normal.     Breath sounds:  Normal breath sounds.  Abdominal:     General: Abdomen is flat. Bowel sounds are normal.     Palpations: Abdomen is soft.     Tenderness: There is abdominal tenderness in the right lower quadrant, epigastric area and left upper quadrant. There is no guarding or rebound. Negative signs include Murphy's sign, Rovsing's sign, McBurney's sign, psoas sign and obturator sign.  Skin:    General: Skin is warm and dry.  Neurological:     Mental Status: She is alert.  Psychiatric:        Behavior: Behavior is cooperative.      UC Treatments / Results  Labs (all labs ordered are listed, but only abnormal results are displayed) Labs Reviewed  POCT URINALYSIS DIP (MANUAL ENTRY) - Abnormal; Notable for the following components:      Result Value   Glucose, UA >=1,000 (*)    Ketones, POC UA small (15) (*)    Spec Grav, UA >=1.030 (*)    Protein Ur, POC trace (*)    All other components within normal limits  POCT FASTING CBG [facility] MANUAL ENTRY - Abnormal; Notable for the following components:   POCT Glucose ([facility]) 332 (*)    All other components within normal limits  POCT URINE PREGNANCY - Normal  POC HEMOCCULT BLD/STL (OFFICE/1-CARD/DIAGNOSTIC) - Normal  CBC  COMPREHENSIVE METABOLIC PANEL WITH GFR    EKG   Radiology No results found.  Procedures Procedures (including critical care time)  Medications Ordered in UC Medications - No data to display  Initial Impression / Assessment and Plan / UC Course  I have reviewed the triage vital signs and the nursing notes.  Pertinent labs & imaging results that were available during my care of the patient were reviewed by me and considered in my medical decision making (see chart for details).     Patient is overall well-appearing.  Vitals are stable.  Mild tachycardia present.  Patient does appear to be anxious during exam.  Mild tenderness noted to epigastric, left upper quadrant, and right lower quadrant.  Hemoccult performed and was  negative.  Urinalysis revealed large amounts of glucose, small ketones, and trace protein.  CBG in clinic was elevated at 332.  Urine pregnancy negative.  Ordered CBC and CMP to evaluate for underlying causes related to abdominal pain and to evaluate renal function.  Recommended continuing with medications as prescribed.  Recommended continue with Imodium as needed for diarrhea.  Discussed follow-up, return, and strict ER precautions. Final Clinical Impressions(s) / UC Diagnoses   Final diagnoses:  Dark stools  Generalized abdominal pain  Hyperglycemia     Discharge Instructions      Continue with Imodium up to 4 times daily as needed for diarrhea.   Your Hemoccult was negative in clinic today meaning that there is no presence of blood in your stool. Your CBG was elevated in clinic today.  Be sure that you are  taking your medication for diabetes daily as prescribed and I recommend following up with your primary care provider for further evaluation of this. We have drawn some basic labs today to evaluate your blood counts, electrolytes, and kidney function.  If any of these results are concerning someone will call and advise any additional treatment at that time. If you develop worsening abdominal pain, excessive vomiting, lots of blood with blood clots in your stool, high fevers, severe weakness, or passing out please seek immediate medical treatment in the emergency department.    ED Prescriptions   None    PDMP not reviewed this encounter.   Johnie Flaming A, NP 10/02/23 1630

## 2023-10-02 NOTE — Discharge Instructions (Addendum)
 Continue with Imodium up to 4 times daily as needed for diarrhea.   Your Hemoccult was negative in clinic today meaning that there is no presence of blood in your stool. Your CBG was elevated in clinic today.  Be sure that you are taking your medication for diabetes daily as prescribed and I recommend following up with your primary care provider for further evaluation of this. We have drawn some basic labs today to evaluate your blood counts, electrolytes, and kidney function.  If any of these results are concerning someone will call and advise any additional treatment at that time. If you develop worsening abdominal pain, excessive vomiting, lots of blood with blood clots in your stool, high fevers, severe weakness, or passing out please seek immediate medical treatment in the emergency department.

## 2023-10-02 NOTE — Telephone Encounter (Signed)
 Patient called stating she woke up with abdominal pain and has had 3 bowel movements today, all with coffee ground black color.   She denies any nausea or vomiting.   Discussed concerns for GI bleed and recommended she go to the emergency room. Patient verbalized understanding and has no further questions.   Mohamadou Maciver, BSN, RN

## 2023-10-02 NOTE — ED Triage Notes (Signed)
 C/O very dark, dark brown stools, almost black onset today. Reports 4-5 episodes of diarrhea today. Also c/o RLQ pain. Denies n/v.

## 2023-10-03 ENCOUNTER — Ambulatory Visit (HOSPITAL_COMMUNITY): Payer: Self-pay

## 2023-10-03 NOTE — Telephone Encounter (Signed)
 Metabolic panel is consistent with patient's most recent hemoglobin A1c of 10.8.  Panel also reflects a very poor diet that is high in sugar and carbohydrates, low in protein.  Patient's symptoms of dark color diarrhea is concerning for gastroparesis related to her poorly controlled type 2 diabetes.  At this point, patient should be started on insulin, this is a conversation that she needs to have with her PCP.  Recommend discontinue Imodium at this time and begin fiber supplement such as psyllium capsules, fiber, and, Metamucil twice daily to provide bulk to her stool.  If this does not improve her diarrhea, recommend abdominal imaging to evaluate for intestinal obstruction secondary to possible gastroparesis.

## 2023-10-09 ENCOUNTER — Telehealth: Payer: Self-pay

## 2023-10-09 MED ORDER — DOXYCYCLINE HYCLATE 100 MG PO TABS: 100.0000 mg | ORAL_TABLET | Freq: Two times a day (BID) | ORAL | 0 refills | Status: DC

## 2023-10-09 NOTE — Addendum Note (Signed)
 Addended by: Samary Shatz D on: 10/09/2023 11:27 AM   Modules accepted: Orders

## 2023-10-09 NOTE — Telephone Encounter (Signed)
 Spoke with Rosina, relayed per Greg Calone, NP that abscess might need to be drained, but he recommends warm compress and sent in doxycycline . Asked her to please call if she does not see improvement.   Patient verbalized understanding and has no further questions.   Trilby Way, BSN, RN

## 2023-10-09 NOTE — Telephone Encounter (Signed)
 Kristin Hart called, she had an abscess in her left axilla that was previously drained (ED note 5/15). She reports that she's noticed it coming back over the last 2 days and would like to know if there's anything she can do to treat it. Denies fever or chills. Uses [facility] on [address].   Kristin Hart, BSN, RN

## 2023-10-17 ENCOUNTER — Other Ambulatory Visit: Payer: Self-pay

## 2023-10-17 ENCOUNTER — Encounter: Payer: Self-pay | Admitting: Family

## 2023-10-17 ENCOUNTER — Other Ambulatory Visit (HOSPITAL_COMMUNITY)
Admission: RE | Admit: 2023-10-17 | Discharge: 2023-10-17 | Disposition: A | Discharge status: Home / Self Care | Source: Ambulatory Visit | Attending: Family | Admitting: Family

## 2023-10-17 ENCOUNTER — Ambulatory Visit: Admitting: Family

## 2023-10-17 VITALS — BP 141/89 | HR 119 | Temp 97.7°F | Ht 67.0 in | Wt 161.0 lb

## 2023-10-17 DIAGNOSIS — B20 Human immunodeficiency virus [HIV] disease: Secondary | ICD-10-CM | POA: Diagnosis not present

## 2023-10-17 DIAGNOSIS — E1142 Type 2 diabetes mellitus with diabetic polyneuropathy: Secondary | ICD-10-CM | POA: Diagnosis not present

## 2023-10-17 DIAGNOSIS — Z7984 Long term (current) use of oral hypoglycemic drugs: Secondary | ICD-10-CM

## 2023-10-17 DIAGNOSIS — M25551 Pain in right hip: Secondary | ICD-10-CM

## 2023-10-17 DIAGNOSIS — Z113 Encounter for screening for infections with a predominantly sexual mode of transmission: Secondary | ICD-10-CM | POA: Insufficient documentation

## 2023-10-17 DIAGNOSIS — Z0000 Encounter for general adult medical examination without abnormal findings: Secondary | ICD-10-CM

## 2023-10-17 DIAGNOSIS — E1165 Type 2 diabetes mellitus with hyperglycemia: Secondary | ICD-10-CM

## 2023-10-17 DIAGNOSIS — M25552 Pain in left hip: Secondary | ICD-10-CM | POA: Diagnosis not present

## 2023-10-17 DIAGNOSIS — E118 Type 2 diabetes mellitus with unspecified complications: Secondary | ICD-10-CM

## 2023-10-17 MED ORDER — BIKTARVY 50-200-25 MG PO TABS: 1.0000 | ORAL_TABLET | Freq: Every day | ORAL | 5 refills | Status: DC

## 2023-10-17 MED ORDER — DICLOFENAC SODIUM 1 % EX GEL: 4.0000 g | Freq: Four times a day (QID) | CUTANEOUS | 0 refills | Status: DC | PRN

## 2023-10-17 MED ORDER — FLUCONAZOLE 150 MG PO TABS
150.0000 mg | ORAL_TABLET | Freq: Once | ORAL | 1 refills | Status: AC
Start: 2023-10-17 — End: 2023-10-17

## 2023-10-17 NOTE — Assessment & Plan Note (Signed)
 Discussed importance of safe sexual practice and condom use. Condoms and site specific STD testing offered.  Vaccinations reviewed and declined following counseling. Cervical cancer screening up to date.  Dental care pending appointment in September.

## 2023-10-17 NOTE — Progress Notes (Signed)
 Brief Narrative   Patient ID: Kristin Hart, female    DOB: 1980-09-24, 43 y.o.   MRN: [id number]  Kristin Hart is a 43 y/o caucasian female diagnosed with HIV disease on 01/02/19 with risk factor of heterosexual contact and IV drug use. Initial viral load on 06/23/19 23,300 and CD4 count 577. Genotype with no significant drug resistance. Entered care at [facility] Stage 1. [id number] negative. No history of opportunistic infection. Sole medication regimen of Biktarvy    Subjective:   Chief Complaint  Patient presents with   Follow-up    B20    HPI:  Kristin Hart is a 43 y.o. female with HIV disease last seen on 08/29/2023 with well-controlled virus and good adherence and tolerance to Biktarvy .  Previous viral load was undetectable with CD4 count 973.  Continues to work with case management.  Now with endocrinology for diabetes care.  Here today for follow-up.  Kristin Hart has been doing okay since her last office visit and continues to take Biktarvy  as prescribed with no adverse side effects or problems obtaining medication from the pharmacy.  Covered by Medicaid.  Requesting refill of Voltaren  gel for bilateral hip pain and fluconazole  as she was taking doxycycline  for recent infection to prevent secondary Candida infection.  Has a dental appointment scheduled for September with new broken tooth.  No change in housing or social situation.  Access to food can be challenging although stable.  Transportation is via bus and will provide bus passes. Healthcare maintenance reviewed. Condoms and site specific STD testing offered.    Denies fevers, chills, night sweats, headaches, changes in vision, neck pain/stiffness, nausea, diarrhea, vomiting, lesions or rashes.  Lab Results  Component Value Date   CD4TCELL 61 11/15/2022   CD4TABS 973 11/15/2022   Lab Results  Component Value Date   HIV1RNAQUANT Not Detected 11/15/2022     No Known Allergies    Outpatient Medications Prior to Visit   Medication Sig Dispense Refill   Accu-Chek Softclix Lancets lancets Use as instructed 100 each 12   acetaminophen  (TYLENOL ) 500 MG tablet Take 1 tablet (500 mg total) by mouth every 6 (six) hours as needed. 30 tablet 0   albuterol  (VENTOLIN  HFA) 108 (90 Base) MCG/ACT inhaler INHALE 1 PUFF INTO THE LUNGS EVERY 4 HOURS AS NEEDED. 18 each 2   Blood Glucose Monitoring Suppl (BLOOD GLUCOSE MONITOR SYSTEM) w/Device KIT 1 Device by Does not apply route daily before breakfast. 1 kit 0   Blood Glucose Monitoring Suppl DEVI 1 each by Does not apply route in the morning and at bedtime. May substitute to any manufacturer covered by patient's insurance. 1 each 0   cetirizine  (ZYRTEC ) 10 MG tablet TAKE 1 TABLET(10 MG) BY MOUTH DAILY 30 tablet 3   clobetasol  cream (TEMOVATE ) 0.05 % Apply 1 Application topically 2 (two) times daily. 30 g 0   cyclobenzaprine  (FLEXERIL ) 5 MG tablet Take 1 tablet (5 mg total) by mouth at bedtime. 30 tablet 4   doxycycline  (VIBRA -TABS) 100 MG tablet Take 1 tablet (100 mg total) by mouth 2 (two) times daily. 20 tablet 0   glipiZIDE  (GLUCOTROL  XL) 10 MG 24 hr tablet Take 1 tablet (10 mg total) by mouth daily. 90 tablet 3   hydrocortisone  cream 1 % Apply to affected area 2 times daily 15 g 0   Lancets Misc. (ACCU-CHEK SOFTCLIX LANCET DEV) KIT 1 each by Does not apply route daily before breakfast. 1 kit 0   losartan -hydrochlorothiazide  (HYZAAR) 50-12.5 MG tablet  Take 1 tablet by mouth daily. 90 tablet 3   metFORMIN  (GLUCOPHAGE -XR) 500 MG 24 hr tablet Take 2 tablets (1,000 mg total) by mouth daily with breakfast. 180 tablet 3   mupirocin  ointment (BACTROBAN ) 2 % Apply 1 Application topically 2 (two) times daily. To affected area till better 22 g 0   naproxen  (NAPROSYN ) 500 MG tablet TAKE 1 TABLET(500 MG) BY MOUTH TWICE DAILY WITH A MEAL 60 tablet 2   nystatin  cream (MYCOSTATIN ) Apply to affected area 2 times daily 30 g 0   omeprazole  (PRILOSEC) 40 MG capsule TAKE 1 CAPSULE(40 MG) BY  MOUTH DAILY 90 capsule 1   ondansetron  (ZOFRAN -ODT) 4 MG disintegrating tablet Take 1 tablet (4 mg total) by mouth every 8 (eight) hours as needed for nausea or vomiting. 20 tablet 0   pioglitazone  (ACTOS ) 15 MG tablet Take 2 tablets (30 mg total) by mouth daily. 90 tablet 3   rizatriptan  (MAXALT ) 10 MG tablet TAKE 1/2-1 TABLET BY MOUTH AS NEEDED FOR MIGRAINE. MAY REPEAT IN 2 HOURS IF NEEDED. MAX OF 2 TABLETS PER DAY 10 tablet 1   rosuvastatin  (CRESTOR ) 10 MG tablet Take 1 tablet (10 mg total) by mouth daily. 90 tablet 3   topiramate  (TOPAMAX ) 50 MG tablet TAKE 1 TABLET(50 MG) BY MOUTH DAILY 30 tablet 5   valACYclovir  (VALTREX ) 500 MG tablet TAKE 1 TABLET(500 MG) BY MOUTH TWICE DAILY 60 tablet 5   bictegravir-emtricitabine-tenofovir AF (BIKTARVY ) 50-200-25 MG TABS tablet Take 1 tablet by mouth daily. 30 tablet 5   diclofenac  Sodium (VOLTAREN ) 1 % GEL Apply 4 g topically 4 (four) times daily as needed. 350 g 0   No facility-administered medications prior to visit.     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, per pt   Depression    Diabetes mellitus without complication (HCC)    type 2   Foreign body in small intestine    GERD (gastroesophageal reflux disease)    HIV (human immunodeficiency virus infection) (HCC)    Hypothyroidism    Stomach ulcer    Substance abuse (HCC)    Meth, IV drug use     Past Surgical History:  Procedure Laterality Date   CESAREAN SECTION N/A 07/04/2016   Procedure: CESAREAN SECTION;  Surgeon: Lang JINNY Peel, DO;  Location: [facility] BIRTHING SUITES;  Service: Obstetrics;  Laterality: N/A;   ENTEROSCOPY N/A 11/08/2017   Procedure: ENTEROSCOPY;  Surgeon: Abran Norleen SAILOR, MD;  Location: [facility] ENDOSCOPY;  Service: Endoscopy;  Laterality: N/A;   NO PAST SURGERIES          Review of Systems  Constitutional:  Negative for  appetite change, chills, diaphoresis, fatigue, fever and unexpected weight change.  Eyes:        Negative for acute change in vision  Respiratory:  Negative for chest tightness, shortness of breath and wheezing.   Cardiovascular:  Negative for chest pain.  Gastrointestinal:  Negative for diarrhea, nausea and vomiting.  Genitourinary:  Negative for dysuria, pelvic pain and vaginal discharge.  Musculoskeletal:  Negative for neck pain and neck stiffness.  Skin:  Negative for rash.  Neurological:  Negative for seizures, syncope, weakness and headaches.  Hematological:  Negative for adenopathy. Does not bruise/bleed easily.  Psychiatric/Behavioral:  Negative for hallucinations.      Objective:   BP (!) 141/89   Pulse ROLLEN)  119   Temp 97.7 F (36.5 C) (Temporal)   Ht 5' 7 (1.702 m)   Wt 161 lb (73 kg)   SpO2 99%   BMI 25.22 kg/m  Nursing note and vital signs reviewed.  Physical Exam Constitutional:      General: She is not in acute distress.    Appearance: She is well-developed.  Cardiovascular:     Rate and Rhythm: Normal rate and regular rhythm.     Heart sounds: Normal heart sounds. No murmur heard.    No friction rub. No gallop.  Pulmonary:     Effort: Pulmonary effort is normal. No respiratory distress.     Breath sounds: Normal breath sounds. No wheezing or rales.  Chest:     Chest wall: No tenderness.  Skin:    General: Skin is warm and dry.     Findings: No rash.  Neurological:     Mental Status: She is alert and oriented to person, place, and time.  Psychiatric:        Mood and Affect: Mood normal.          10/17/2023    3:57 PM 08/29/2023    2:01 PM 06/10/2023    4:46 PM 04/29/2023    3:50 PM 03/25/2023    3:26 PM  Depression screen PHQ 2/9  Decreased Interest 1 0 1 0 1  Down, Depressed, Hopeless 2 0 1 1 1   PHQ - 2 Score 3 0 2 1 2   Altered sleeping 2 0 2  1  Tired, decreased energy 2 0 1  1  Change in appetite 0 0 1  1  Feeling bad or failure about  yourself  3 0 1  1  Trouble concentrating 1 0 2  1  Moving slowly or fidgety/restless 0 0 2  0  Suicidal thoughts 0 0 0  0  PHQ-9 Score 11 0 11  7  Difficult doing work/chores Somewhat difficult  Somewhat difficult          10/17/2023    3:57 PM 08/29/2023    2:02 PM 11/29/2021    1:33 PM 05/30/2017    3:02 PM  GAD 7 : Generalized Anxiety Score  Nervous, Anxious, on Edge 2 0 1 2  Control/stop worrying 2 0 1 2  Worry too much - different things 1 0 1 1  Trouble relaxing 1 0 1 1  Restless 0 0 0 1  Easily annoyed or irritable 1 0 1 1  Afraid - awful might happen 0 0 0 0  Total GAD 7 Score 7 0 5 8  Anxiety Difficulty Somewhat difficult   Somewhat difficult     The 10-year ASCVD risk score (Arnett DK, et al., 2019) is: 1.9%   Values used to calculate the score:     Age: 89 years     Clincally relevant sex: Female     Is Non-Hispanic African American: No     Diabetic: Yes     Tobacco smoker: No     Systolic Blood Pressure: 141 mmHg     Is BP treated: Yes     HDL Cholesterol: 50 mg/dL     Total Cholesterol: 179 mg/dL      Assessment & Plan:    Patient Active Problem List   Diagnosis Date Noted   Diabetic polyneuropathy associated with type 2 diabetes mellitus (HCC) 10/17/2023   Nausea 08/29/2023   Poison ivy 07/01/2023   Infection of left hand 04/24/2023   Dry skin  03/12/2023   Diarrhea 12/12/2022   Lower abdominal pain 05/28/2022   Post-nasal drip 05/28/2022   Plantar fasciitis, right 03/19/2022   Numbness and tingling in both hands 03/19/2022   Poor social situation 10/25/2021   Axillary abscess 09/20/2021   Therapeutic drug monitoring 08/24/2021   Birth control counseling 12/20/2020   Hypertension 09/21/2020   Grief 08/29/2020   Encounter for surveillance of injectable contraceptive 07/29/2020   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   Screening for cervical cancer 04/21/2020   Screening for STDs (sexually transmitted diseases) 04/21/2020    Costochondritis 02/19/2020   Bilateral hip pain 02/19/2020   Elbow pain, right 12/04/2019   Housing problems 11/23/2019   Mental health disorder 10/12/2019   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   Paresthesias 10/12/2019   Healthcare maintenance 08/05/2019   HIV disease (HCC) 12/31/2018   Anxiety 05/29/2017   Self-mutilation 05/29/2017   Depression 06/07/2016   Marijuana use 06/07/2016   Low vitamin D  level 03/15/2016   Type 2 diabetes mellitus with hyperglycemia (HCC) 02/22/2016   Asthma 02/22/2016   ADD (attention deficit disorder) 02/22/2016   Methamphetamine use disorder, severe, 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/22/2016   Hyperlipidemia with target LDL less than 70 01/31/2012   Migraine 01/31/2012   Tremor, 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     Problem List Items Addressed This Visit       Endocrine   Type 2 diabetes mellitus with hyperglycemia (HCC)   Currently poorly controlled with A1c of 10.8% and working with Endocrinology. Now complicated by diabetic neuropathy in bilateral feet with referral to Podiatry placed for diabetic foot care. Continue medications per Endocrinology.       Relevant Orders   Ambulatory referral to Podiatry   Diabetic polyneuropathy associated with type 2 diabetes mellitus (HCC)   Diabetic neuropathy noted on foot exam. Primarily distal toes and metatarsal heads. Would benefit from diabetic foot care and will refer to Podiatry. Educated to check feet on a daily basis.       Relevant Orders   Ambulatory referral to Podiatry     Other   HIV disease (HCC) - Primary   Kristin Hart continues to have well controlled virus with good adherence and tolerance to Biktarvy .  Reviewed lab work and discussed plan of care, U equals U, and family planning. Social determinants of health reviewed and no new interventions indicated. Continues to work with Medical Case Production designer, theatre/television/film. Check lab work. Continue current dose of Biktarvy . Plan for follow up in  1 month or sooner if needed with lab work on  the same day..       Relevant Medications   bictegravir-emtricitabine-tenofovir AF (BIKTARVY ) 50-200-25 MG TABS tablet   fluconazole  (DIFLUCAN ) 150 MG tablet   Other Relevant Orders   HIV-1 RNA quant-no reflex-bld   T-helper cell (CD4)- ([facility] clinic only)   Healthcare maintenance   Discussed importance of safe sexual practice and condom use. Condoms and site specific STD testing offered.  Vaccinations reviewed and declined following counseling. Cervical cancer screening up to date.  Dental care pending appointment in September.       Bilateral hip pain   Relevant Medications   diclofenac  Sodium (VOLTAREN ) 1 % GEL   Screening for STDs (sexually transmitted diseases)   Relevant Orders   Urine cytology ancillary only   Other Visit Diagnoses       Diabetic foot [facility])       Relevant Orders   Ambulatory referral to Podiatry  I am having Kristin Hart. Kristin Hart start on fluconazole . I am also having her maintain her hydrocortisone  cream, cyclobenzaprine , acetaminophen , nystatin  cream, mupirocin  ointment, cetirizine , valACYclovir , Accu-Chek Softclix Lancet Dev, Blood Glucose Monitor System, Blood Glucose Monitoring Suppl, glipiZIDE , albuterol , Accu-Chek Softclix Lancets, losartan -hydrochlorothiazide , topiramate , rosuvastatin , clobetasol  cream, naproxen , rizatriptan , omeprazole , ondansetron , metFORMIN , pioglitazone , doxycycline , Biktarvy , and diclofenac  Sodium.   Meds ordered this encounter  Medications   bictegravir-emtricitabine-tenofovir AF (BIKTARVY ) 50-200-25 MG TABS tablet    Sig: Take 1 tablet by mouth daily.    Dispense:  30 tablet    Refill:  5    Supervising Provider:   SNIDER, CYNTHIA [phone]    Prescription Type::   Renewal   fluconazole  (DIFLUCAN ) 150 MG tablet    Sig: Take 1 tablet (150 mg total) by mouth once for 1 dose. Repeat in 72 hours if needed.    Dispense:  2 tablet    Refill:  1    Supervising Provider:   LUIZ, CYNTHIA [[id number]]   diclofenac  Sodium  (VOLTAREN ) 1 % GEL    Sig: Apply 4 g topically 4 (four) times daily as needed.    Dispense:  350 g    Refill:  0    Supervising Provider:   LUIZ CHANNEL [[id number]]     Follow-up: Return in about 1 month (around 11/17/2023). or sooner if needed.    Cathlyn July, MSN, FNP-C Nurse Practitioner [facility] Main number: [phone]

## 2023-10-17 NOTE — Patient Instructions (Signed)
Nice to see you.  We will check your lab work today.  Continue to take your medication daily as prescribed.  Refills have been sent to the pharmacy.  Plan for follow up in 1 months or sooner if needed with lab work on the same day.  Have a great day and stay safe!  

## 2023-10-17 NOTE — Assessment & Plan Note (Signed)
 Kristin Hart continues to have well controlled virus with good adherence and tolerance to Biktarvy .  Reviewed lab work and discussed plan of care, U equals U, and family planning. Social determinants of health reviewed and no new interventions indicated. Continues to work with Medical Case Production designer, theatre/television/film. Check lab work. Continue current dose of Biktarvy . Plan for follow up in  1 month or sooner if needed with lab work on the same day.SABRA

## 2023-10-17 NOTE — Assessment & Plan Note (Signed)
 Currently poorly controlled with A1c of 10.8% and working with Endocrinology. Now complicated by diabetic neuropathy in bilateral feet with referral to Podiatry placed for diabetic foot care. Continue medications per Endocrinology.

## 2023-10-17 NOTE — Assessment & Plan Note (Signed)
 Diabetic neuropathy noted on foot exam. Primarily distal toes and metatarsal heads. Would benefit from diabetic foot care and will refer to Podiatry. Educated to check feet on a daily basis.

## 2023-10-18 LAB — T-HELPER CELL (CD4) - (RCID CLINIC ONLY)
CD4 % Helper T Cell: 58 % (ref 33–65)
CD4 T Cell Abs: 825 /uL (ref 400–1790)

## 2023-10-20 LAB — HIV-1 RNA QUANT-NO REFLEX-BLD
HIV 1 RNA Quant: NOT DETECTED {copies}/mL
HIV-1 RNA Quant, Log: NOT DETECTED {Log_copies}/mL

## 2023-10-21 ENCOUNTER — Ambulatory Visit: Payer: Self-pay | Admitting: Family

## 2023-10-21 LAB — URINE CYTOLOGY ANCILLARY ONLY
Chlamydia: NEGATIVE
Comment: NEGATIVE
Comment: NORMAL
Neisseria Gonorrhea: NEGATIVE

## 2023-10-23 ENCOUNTER — Other Ambulatory Visit: Payer: Self-pay | Admitting: Pharmacist

## 2023-10-23 ENCOUNTER — Ambulatory Visit: Admitting: Podiatry

## 2023-10-23 DIAGNOSIS — B20 Human immunodeficiency virus [HIV] disease: Secondary | ICD-10-CM

## 2023-10-23 DIAGNOSIS — L600 Ingrowing nail: Secondary | ICD-10-CM | POA: Diagnosis not present

## 2023-10-23 MED ORDER — BICTEGRAVIR-EMTRICITAB-TENOFOV 50-200-25 MG PO TABS: 1.0000 | ORAL_TABLET | Freq: Every day | ORAL | Status: AC

## 2023-10-23 NOTE — Progress Notes (Signed)
 Medication Samples have been provided to the patient.  Drug name: Biktarvy         Strength: 50/200/25 mg       Qty: 7 tablets (1 bottles) LOT: CTGMDA   Exp.Date: 7/27  Samples requested by Marlan Silva, NP.  Dosing instructions: Take one tablet by mouth once daily  The patient has been instructed regarding the correct time, dose, and frequency of taking this medication, including desired effects and most common side effects.   Nicklas Barns, PharmD, CPP, BCIDP, AAHIVP Clinical Pharmacist Practitioner Infectious Diseases Clinical Pharmacist [facility]

## 2023-10-24 NOTE — Progress Notes (Signed)
 Subjective:   Patient ID: Kristin Hart, female   DOB: 43 y.o.   MRN: [id number]   HPI Patient presents with poor health and appears to have neuropathic like symptoms bilateral and ingrown toenail left big toe.  Patient does have diabetes not in good control and seems somewhat unaware of the diabetes and where she stands with that and does take 2 oral medicines.  Patient does not smoke is not active   Review of Systems  All other systems reviewed and are negative.       Objective:  Physical Exam Vitals and nursing note reviewed.  Constitutional:      Appearance: She is well-developed.  Pulmonary:     Effort: Pulmonary effort is normal.  Musculoskeletal:        General: Normal range of motion.  Skin:    General: Skin is warm.  Neurological:     Mental Status: She is alert.     Vascular status was found to be intact neurologically she does have diminishment sharp dull vibratory and very little understanding of her diabetes.  She does have incurvation of the hallux nail left but it is not erythematous it there is no drainage and its only mild tenderness it appears to be more structural.  She has tried gabapentin  in the past she does get shooting type pains but was not able to tolerate medication     Assessment:  Very difficult situation as the patient is not very well versed  in her diabetes and while taking medicine still does not know her daily sugars or A1c     Plan:  H&P reviewed and I did review her chart and it appears her A1c has come down slightly but still is high and I did go over with her the importance of getting that into a better level.  At this point I do not recommend correction of ingrown toenail but if it were to get more red or become painful then ultimately it may require correction and I did discuss daily inspections of her feet and if any breaks in skin any thing were to occur she is to contact us  immediately

## 2023-10-28 ENCOUNTER — Other Ambulatory Visit: Payer: Self-pay | Admitting: Family

## 2023-11-14 ENCOUNTER — Encounter: Payer: Self-pay | Admitting: Family

## 2023-11-14 ENCOUNTER — Ambulatory Visit

## 2023-11-14 ENCOUNTER — Other Ambulatory Visit: Payer: Self-pay

## 2023-11-14 ENCOUNTER — Ambulatory Visit (INDEPENDENT_AMBULATORY_CARE_PROVIDER_SITE_OTHER): Admitting: Family

## 2023-11-14 VITALS — BP 144/94 | HR 97 | Temp 97.6°F | Wt 159.0 lb

## 2023-11-14 DIAGNOSIS — Z3009 Encounter for other general counseling and advice on contraception: Secondary | ICD-10-CM

## 2023-11-14 DIAGNOSIS — B20 Human immunodeficiency virus [HIV] disease: Secondary | ICD-10-CM

## 2023-11-14 DIAGNOSIS — L84 Corns and callosities: Secondary | ICD-10-CM | POA: Diagnosis not present

## 2023-11-14 DIAGNOSIS — Z23 Encounter for immunization: Secondary | ICD-10-CM

## 2023-11-14 DIAGNOSIS — Z599 Problem related to housing and economic circumstances, unspecified: Secondary | ICD-10-CM | POA: Diagnosis not present

## 2023-11-14 DIAGNOSIS — Z0000 Encounter for general adult medical examination without abnormal findings: Secondary | ICD-10-CM

## 2023-11-14 MED ORDER — MEDROXYPROGESTERONE ACETATE 150 MG/ML IM SUSP
150.0000 mg | Freq: Once | INTRAMUSCULAR | Status: AC
  Administered 2023-11-14: 150 mg via INTRAMUSCULAR

## 2023-11-14 NOTE — Patient Instructions (Addendum)
 Nice to see you.  Continue to take your medication daily as prescribed.  Refills have been sent to the pharmacy.  Plan for follow up in 1 months or sooner if needed with lab work on the same day.  Have a great day and stay safe!  Mental Health Resources  988: can call or text 24/7  [facility]: Address: [address] [place], [place] [address] Open 24 hours Phone: [phone]  [facility] [place]: Address: [address], [place], [place] [address] Phone: [phone] Appointments: [email or web address]

## 2023-11-14 NOTE — Progress Notes (Unsigned)
 Brief Narrative   Patient ID: Kristin Hart, female    DOB: 07/05/1980, 43 y.o.   MRN: [id number]  Kristin Hart is a 43 y/o caucasian female diagnosed with HIV disease on 01/02/19 with risk factor of heterosexual contact and IV drug use. Initial viral load on 06/23/19 23,300 and CD4 count 577. Genotype with no significant drug resistance. Entered care at [facility] Stage 1. [id number] negative. No history of opportunistic infection. Sole medication regimen of Biktarvy    Subjective:   Chief Complaint  Patient presents with   Follow-up    Flu shot, depo,food pantry, condoms    HPI:  Kristin Hart is a 43 y.o. female with HIV disease last seen on 10/17/2023 with well-controlled virus and good adherence and tolerance to Biktarvy .  Viral load was undetectable with CD4 count 825.  Here today for routine follow-up.  Kristin Hart has been doing okay since her last office visit and continues to take Biktarvy  as prescribed with no adverse side effects or problems obtaining medication from the pharmacy.  Covered by Medicaid.  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  Housing situation remains labile alternating between staying in a tent or underneath stairs.  Does have concern of her right second toe with pain that has been going on for approximately 1 to 2 weeks.  No trauma or injury.  Quality of pain is not able to be described with severity affecting her ambulation and inability to walk at times.  No open wounds.  Corn cushion or Band-Aid sometimes helps with symptoms.  Healthcare maintenance reviewed.  Condoms and site-specific STD testing offered.    Denies fevers, chills, night sweats, headaches, changes in vision, neck pain/stiffness, nausea, diarrhea, vomiting, lesions or rashes.  Lab Results  Component Value Date   CD4TCELL 58 10/17/2023   CD4TABS 825 10/17/2023   Lab Results  Component Value Date   HIV1RNAQUANT NOT DETECTED 10/17/2023     No Known Allergies    Outpatient Medications Prior to  Visit  Medication Sig Dispense Refill   Accu-Chek Softclix Lancets lancets Use as instructed 100 each 12   acetaminophen  (TYLENOL ) 500 MG tablet Take 1 tablet (500 mg total) by mouth every 6 (six) hours as needed. 30 tablet 0   albuterol  (VENTOLIN  HFA) 108 (90 Base) MCG/ACT inhaler INHALE 1 PUFF INTO THE LUNGS EVERY 4 HOURS AS NEEDED. 18 each 2   bictegravir-emtricitabine-tenofovir AF (BIKTARVY ) 50-200-25 MG TABS tablet Take 1 tablet by mouth daily. 30 tablet 5   Blood Glucose Monitoring Suppl (BLOOD GLUCOSE MONITOR SYSTEM) w/Device KIT 1 Device by Does not apply route daily before breakfast. 1 kit 0   Blood Glucose Monitoring Suppl DEVI 1 each by Does not apply route in the morning and at bedtime. May substitute to any manufacturer covered by patient's insurance. 1 each 0   cetirizine  (ZYRTEC ) 10 MG tablet TAKE 1 TABLET(10 MG) BY MOUTH DAILY 30 tablet 3   clobetasol  cream (TEMOVATE ) 0.05 % Apply 1 Application topically 2 (two) times daily. 30 g 0   cyclobenzaprine  (FLEXERIL ) 5 MG tablet Take 1 tablet (5 mg total) by mouth at bedtime. 30 tablet 4   diclofenac  Sodium (VOLTAREN ) 1 % GEL Apply 4 g topically 4 (four) times daily as needed. 350 g 0   glipiZIDE  (GLUCOTROL  XL) 10 MG 24 hr tablet Take 1 tablet (10 mg total) by mouth daily. 90 tablet 3   hydrocortisone  cream 1 % Apply to affected area 2 times daily 15 g 0   Lancets Misc. (  ACCU-CHEK SOFTCLIX LANCET DEV) KIT 1 each by Does not apply route daily before breakfast. 1 kit 0   losartan -hydrochlorothiazide  (HYZAAR) 50-12.5 MG tablet Take 1 tablet by mouth daily. 90 tablet 3   metFORMIN  (GLUCOPHAGE -XR) 500 MG 24 hr tablet Take 2 tablets (1,000 mg total) by mouth daily with breakfast. 180 tablet 3   mupirocin  ointment (BACTROBAN ) 2 % Apply 1 Application topically 2 (two) times daily. To affected area till better 22 g 0   naproxen  (NAPROSYN ) 500 MG tablet TAKE 1 TABLET(500 MG) BY MOUTH TWICE DAILY WITH A MEAL 60 tablet 2   nystatin  cream (MYCOSTATIN )  Apply to affected area 2 times daily 30 g 0   omeprazole  (PRILOSEC) 40 MG capsule TAKE 1 CAPSULE(40 MG) BY MOUTH DAILY 90 capsule 1   pioglitazone  (ACTOS ) 15 MG tablet Take 2 tablets (30 mg total) by mouth daily. 90 tablet 3   rizatriptan  (MAXALT ) 10 MG tablet TAKE 1/2-1 TABLET BY MOUTH AS NEEDED FOR MIGRAINE. MAY REPEAT IN 2 HOURS IF NEEDED. MAX OF 2 TABLETS PER DAY 10 tablet 1   rosuvastatin  (CRESTOR ) 10 MG tablet Take 1 tablet (10 mg total) by mouth daily. 90 tablet 3   topiramate  (TOPAMAX ) 50 MG tablet TAKE 1 TABLET(50 MG) BY MOUTH DAILY 30 tablet 5   valACYclovir  (VALTREX ) 500 MG tablet TAKE 1 TABLET(500 MG) BY MOUTH TWICE DAILY 60 tablet 5   doxycycline  (VIBRA -TABS) 100 MG tablet Take 1 tablet (100 mg total) by mouth 2 (two) times daily. 20 tablet 0   ondansetron  (ZOFRAN -ODT) 4 MG disintegrating tablet Take 1 tablet (4 mg total) by mouth every 8 (eight) hours as needed for nausea or vomiting. 20 tablet 0   No facility-administered medications prior to visit.     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, per pt   Depression    Diabetes mellitus without complication (HCC)    type 2   Foreign body in small intestine    GERD (gastroesophageal reflux disease)    HIV (human immunodeficiency virus infection) (HCC)    Hypothyroidism    Stomach ulcer    Substance abuse (HCC)    Meth, IV drug use     Past Surgical History:  Procedure Laterality Date   CESAREAN SECTION N/A 07/04/2016   Procedure: CESAREAN SECTION;  Surgeon: Lang JINNY Peel, DO;  Location: [facility] BIRTHING SUITES;  Service: Obstetrics;  Laterality: N/A;   ENTEROSCOPY N/A 11/08/2017   Procedure: ENTEROSCOPY;  Surgeon: Abran Norleen SAILOR, MD;  Location: [facility] ENDOSCOPY;  Service: Endoscopy;  Laterality: N/A;   NO PAST SURGERIES          Review of Systems  Constitutional:  Negative for  appetite change, chills, diaphoresis, fatigue, fever and unexpected weight change.  Eyes:        Negative for acute change in vision  Respiratory:  Negative for chest tightness, shortness of breath and wheezing.   Cardiovascular:  Negative for chest pain.  Gastrointestinal:  Negative for diarrhea, nausea and vomiting.  Genitourinary:  Negative for dysuria, pelvic pain and vaginal discharge.  Musculoskeletal:  Negative for neck pain and neck stiffness.  Skin:  Negative for rash.  Neurological:  Negative for seizures, syncope, weakness and headaches.  Hematological:  Negative for adenopathy. Does not bruise/bleed easily.  Psychiatric/Behavioral:  Negative for hallucinations.      Objective:  BP (!) 144/94   Pulse 97   Temp 97.6 F (36.4 C) (Oral)   Wt 159 lb (72.1 kg)   SpO2 100%   BMI 24.90 kg/m  Nursing note and vital signs reviewed.  Physical Exam Constitutional:      General: She is not in acute distress.    Appearance: She is well-developed.  Eyes:     Conjunctiva/sclera: Conjunctivae normal.  Cardiovascular:     Rate and Rhythm: Normal rate and regular rhythm.     Heart sounds: Normal heart sounds. No murmur heard.    No friction rub. No gallop.  Pulmonary:     Effort: Pulmonary effort is normal. No respiratory distress.     Breath sounds: Normal breath sounds. No wheezing or rales.  Chest:     Chest wall: No tenderness.  Abdominal:     General: Bowel sounds are normal.     Palpations: Abdomen is soft.     Tenderness: There is no abdominal tenderness.  Musculoskeletal:     Cervical back: Neck supple.  Lymphadenopathy:     Cervical: No cervical adenopathy.  Skin:    General: Skin is warm and dry.     Findings: No rash.  Neurological:     Mental Status: She is alert and oriented to person, place, and time.  Psychiatric:        Behavior: Behavior normal.        Thought Content: Thought content normal.        Judgment: Judgment normal.           11/14/2023    3:25 PM 10/17/2023    3:57 PM 08/29/2023    2:01 PM 06/10/2023    4:46 PM 04/29/2023    3:50 PM  Depression screen PHQ 2/9  Decreased Interest 1 1 0 1 0  Down, Depressed, Hopeless 1 2 0 1 1  PHQ - 2 Score 2 3 0 2 1  Altered sleeping 0 2 0 2   Tired, decreased energy 1 2 0 1   Change in appetite 0 0 0 1   Feeling bad or failure about yourself  1 3 0 1   Trouble concentrating 0 1 0 2   Moving slowly or fidgety/restless 0 0 0 2   Suicidal thoughts 0 0 0 0   PHQ-9 Score 4 11 0 11   Difficult doing work/chores Somewhat difficult Somewhat difficult  Somewhat difficult         11/14/2023    3:26 PM 10/17/2023    3:57 PM 08/29/2023    2:02 PM 11/29/2021    1:33 PM  GAD 7 : Generalized Anxiety Score  Nervous, Anxious, on Edge 1 2 0 1  Control/stop worrying 1 2 0 1  Worry too much - different things 1 1 0 1  Trouble relaxing 1 1 0 1  Restless 0 0 0 0  Easily annoyed or irritable 2 1 0 1  Afraid - awful might happen 0 0 0 0  Total GAD 7 Score 6 7 0 5  Anxiety Difficulty Somewhat difficult Somewhat difficult       The 10-year ASCVD risk score (Arnett DK, et al., 2019) is: 1.9%   Values used to calculate the score:     Age: 89 years     Clincally relevant sex: Female     Is Non-Hispanic African American: No     Diabetic: Yes     Tobacco smoker: No     Systolic Blood Pressure:  144 mmHg     Is BP treated: Yes     HDL Cholesterol: 50 mg/dL     Total Cholesterol: 179 mg/dL      Assessment & Plan:    Patient Active Problem List   Diagnosis Date Noted   Callus of toe 11/15/2023   Diabetic polyneuropathy associated with type 2 diabetes mellitus (HCC) 10/17/2023   Nausea 08/29/2023   Poison ivy 07/01/2023   Infection of left hand 04/24/2023   Dry skin 03/12/2023   Diarrhea 12/12/2022   Lower abdominal pain 05/28/2022   Post-nasal drip 05/28/2022   Plantar fasciitis, right 03/19/2022   Numbness and tingling in both hands 03/19/2022   Poor social situation 10/25/2021    Axillary abscess 09/20/2021   Therapeutic drug monitoring 08/24/2021   Birth control counseling 12/20/2020   Hypertension 09/21/2020   Grief 08/29/2020   Encounter for surveillance of injectable contraceptive 07/29/2020   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   Screening for cervical cancer 04/21/2020   Screening for STDs (sexually transmitted diseases) 04/21/2020   Costochondritis 02/19/2020   Bilateral hip pain 02/19/2020   Elbow pain, right 12/04/2019   Housing problems 11/23/2019   Mental health disorder 10/12/2019   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   Paresthesias 10/12/2019   Healthcare maintenance 08/05/2019   HIV disease (HCC) 12/31/2018   Anxiety 05/29/2017   Self-mutilation 05/29/2017   Depression 06/07/2016   Marijuana use 06/07/2016   Low vitamin D  level 03/15/2016   Type 2 diabetes mellitus with hyperglycemia (HCC) 02/22/2016   Asthma 02/22/2016   ADD (attention deficit disorder) 02/22/2016   Methamphetamine use disorder, severe, 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/22/2016   Hyperlipidemia with target LDL less than 70 01/31/2012   Migraine 01/31/2012   Tremor, 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     Problem List Items Addressed This Visit       Musculoskeletal and Integument   Callus of toe   Kristin Hart had a hard callus located on the distal aspect of her right second toe.  Encouraged callus care and possible follow-up with podiatry if symptoms worsen or do not improve.        Other   HIV disease (HCC) - Primary   Kristin Hart continues to have well-controlled virus with good adherence and tolerance to Biktarvy .  Reviewed lab work and discussed plan of care and U equals U.  Continue current dose of Biktarvy .  Social determinants of health reviewed with interventions provided including access to food.  Plan for follow-up in 1 month or sooner if needed.      Healthcare maintenance   Discussed importance of safe sexual practice and condom use. Condoms and site specific  STD testing offered.  Vaccinations reviewed and influenza vaccination pending.  Dental care scheduled.       Housing problems   Housing is labile and currently staying in a tent or under stairs at times.  Continues to work with case management.      Birth control counseling   Counseled on birth control and wishes to continue with Depo-provera . No current side effects. Next injection of Depo-provera  provided and next due in 3 months. Condoms provided.       Other Visit Diagnoses       Need for influenza vaccination       Relevant Orders   Flu vaccine trivalent PF, 6mos and older(Flulaval,Afluria,Fluarix,Fluzone) (Completed)        I am having Kristin Hart. Kristin Hart maintain her hydrocortisone  cream, cyclobenzaprine , acetaminophen , nystatin  cream,  mupirocin  ointment, valACYclovir , Accu-Chek Softclix Lancet Dev, Blood Glucose Monitor System, Blood Glucose Monitoring Suppl, glipiZIDE , albuterol , Accu-Chek Softclix Lancets, losartan -hydrochlorothiazide , topiramate , rosuvastatin , clobetasol  cream, naproxen , rizatriptan , omeprazole , ondansetron , metFORMIN , pioglitazone , doxycycline , Biktarvy , diclofenac  Sodium, and cetirizine . We administered medroxyPROGESTERone .   Meds ordered this encounter  Medications   medroxyPROGESTERone  (DEPO-PROVERA ) injection 150 mg     Follow-up: Return in about 1 month (around 12/14/2023). or sooner if needed.    Cathlyn July, MSN, FNP-C Nurse Practitioner [facility] Main number: [phone]

## 2023-11-15 ENCOUNTER — Encounter: Payer: Self-pay | Admitting: Family

## 2023-11-15 DIAGNOSIS — L84 Corns and callosities: Secondary | ICD-10-CM | POA: Insufficient documentation

## 2023-11-15 NOTE — Assessment & Plan Note (Signed)
 Counseled on birth control and wishes to continue with Depo-provera . No current side effects. Next injection of Depo-provera  provided and next due in 3 months. Condoms provided.

## 2023-11-15 NOTE — Assessment & Plan Note (Signed)
 Housing is labile and currently staying in a tent or under stairs at times.  Continues to work with case management.

## 2023-11-15 NOTE — Assessment & Plan Note (Signed)
 Kristin Hart had a hard callus located on the distal aspect of her right second toe.  Encouraged callus care and possible follow-up with podiatry if symptoms worsen or do not improve.

## 2023-11-15 NOTE — Assessment & Plan Note (Signed)
 Discussed importance of safe sexual practice and condom use. Condoms and site specific STD testing offered.  Vaccinations reviewed and influenza vaccination pending.  Dental care scheduled.

## 2023-11-15 NOTE — Assessment & Plan Note (Signed)
 Kristin Hart continues to have well-controlled virus with good adherence and tolerance to Biktarvy .  Reviewed lab work and discussed plan of care and U equals U.  Continue current dose of Biktarvy .  Social determinants of health reviewed with interventions provided including access to food.  Plan for follow-up in 1 month or sooner if needed.

## 2023-12-05 ENCOUNTER — Other Ambulatory Visit: Payer: Self-pay

## 2023-12-05 ENCOUNTER — Ambulatory Visit

## 2023-12-17 ENCOUNTER — Ambulatory Visit: Admitting: Endocrinology

## 2023-12-18 ENCOUNTER — Ambulatory Visit: Admitting: Family

## 2023-12-19 ENCOUNTER — Ambulatory Visit (HOSPITAL_COMMUNITY)
Admission: EM | Admit: 2023-12-19 | Discharge: 2023-12-19 | Disposition: A | Discharge status: Home / Self Care | Attending: Emergency Medicine | Admitting: Emergency Medicine

## 2023-12-19 ENCOUNTER — Encounter (HOSPITAL_COMMUNITY): Payer: Self-pay

## 2023-12-19 DIAGNOSIS — S80262A Insect bite (nonvenomous), left knee, initial encounter: Secondary | ICD-10-CM | POA: Diagnosis not present

## 2023-12-19 DIAGNOSIS — W57XXXA Bitten or stung by nonvenomous insect and other nonvenomous arthropods, initial encounter: Secondary | ICD-10-CM

## 2023-12-19 DIAGNOSIS — L03116 Cellulitis of left lower limb: Secondary | ICD-10-CM

## 2023-12-19 MED ORDER — DOXYCYCLINE HYCLATE 100 MG PO CAPS: 100.0000 mg | ORAL_CAPSULE | Freq: Two times a day (BID) | ORAL | 0 refills | Status: AC

## 2023-12-19 MED ORDER — MUPIROCIN 2 % EX OINT: 1.0000 | TOPICAL_OINTMENT | Freq: Two times a day (BID) | CUTANEOUS | 0 refills | Status: AC

## 2023-12-19 NOTE — ED Triage Notes (Signed)
 Blister on the left knee onset 5 days ago. States the area is still tender to the touch. When removing dressing in triage Patient states the blister does look better than it did.   Has not been seen for this current blister or taken any meds for symptoms.

## 2023-12-19 NOTE — ED Provider Notes (Signed)
 [facility]    CSN: [id number] Arrival date & time: 12/19/23  1946      History   Chief Complaint Chief Complaint  Patient presents with   Blister    HPI Kristin Hart is a 43 y.o. female.   Patient presents with a blister to her left knee that began about 5 days ago.  Patient states that she has had some swelling and redness around the area that has progressively worsened over the last few days.  Patient states that the area does seem to look better than it did before, but is concerned about infection.  Patient states that she does live in a tent and thinks that maybe an insect bit her knee.  Patient denies fever, body aches, chills, and weakness.  The history is provided by the patient and medical records.    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, per pt   Depression    Diabetes mellitus without complication (HCC)    type 2   Foreign body in small intestine    GERD (gastroesophageal reflux disease)    HIV (human immunodeficiency virus infection) (HCC)    Hypothyroidism    Stomach ulcer    Substance abuse (HCC)    Meth, IV drug use    Patient Active Problem List   Diagnosis Date Noted   Callus of toe 11/15/2023   Diabetic polyneuropathy associated with type 2 diabetes mellitus (HCC) 10/17/2023   Nausea 08/29/2023   Poison ivy 07/01/2023   Infection of left hand 04/24/2023   Dry skin 03/12/2023   Diarrhea 12/12/2022   Lower abdominal pain 05/28/2022   Post-nasal drip 05/28/2022   Plantar fasciitis, right 03/19/2022   Numbness and tingling in both hands 03/19/2022   Poor social situation 10/25/2021   Axillary abscess 09/20/2021   Therapeutic drug monitoring 08/24/2021   Birth control counseling 12/20/2020   Hypertension 09/21/2020   Grief 08/29/2020   Encounter for surveillance of injectable  contraceptive 07/29/2020   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   Screening for cervical cancer 04/21/2020   Screening for STDs (sexually transmitted diseases) 04/21/2020   Costochondritis 02/19/2020   Bilateral hip pain 02/19/2020   Elbow pain, right 12/04/2019   Housing problems 11/23/2019   Mental health disorder 10/12/2019   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   Paresthesias 10/12/2019   Healthcare maintenance 08/05/2019   HIV disease (HCC) 12/31/2018   Anxiety 05/29/2017   Self-mutilation 05/29/2017   Depression 06/07/2016   Marijuana use 06/07/2016   Low vitamin D  level 03/15/2016   Type 2 diabetes mellitus with hyperglycemia (HCC) 02/22/2016   Asthma 02/22/2016   ADD (attention deficit disorder) 02/22/2016   Methamphetamine use disorder, severe, 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/22/2016   Hyperlipidemia with target LDL less than 70 01/31/2012   Migraine 01/31/2012   Tremor, 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    Past Surgical History:  Procedure Laterality Date   CESAREAN SECTION N/A 07/04/2016   Procedure: CESAREAN SECTION;  Surgeon: Lang JINNY Peel, DO;  Location: [facility] BIRTHING SUITES;  Service: Obstetrics;  Laterality: N/A;   ENTEROSCOPY N/A 11/08/2017   Procedure: ENTEROSCOPY;  Surgeon: Abran Norleen SAILOR, MD;  Location: [facility] ENDOSCOPY;  Service: Endoscopy;  Laterality: N/A;   NO PAST SURGERIES      OB History     Gravida  1  Para  1   Term  1   Preterm      AB      Living         SAB      IAB      Ectopic      Multiple  0   Live Births               Home Medications    Prior to Admission medications   Medication Sig Start Date End Date Taking? Authorizing Provider  doxycycline  (VIBRAMYCIN ) 100 MG capsule Take 1 capsule (100 mg total) by mouth 2 (two) times daily. 12/19/23  Yes Johnie, Akirra Lacerda A, NP  mupirocin  ointment (BACTROBAN ) 2 % Apply 1 Application topically 2 (two) times daily. 12/19/23  Yes Johnie Flaming A, NP  Accu-Chek Softclix Lancets  lancets Use as instructed 06/10/23   Tharon Lung, MD  acetaminophen  (TYLENOL ) 500 MG tablet Take 1 tablet (500 mg total) by mouth every 6 (six) hours as needed. 03/06/22   Wedderburn, Ngozi N, NP  albuterol  (VENTOLIN  HFA) 108 (90 Base) MCG/ACT inhaler INHALE 1 PUFF INTO THE LUNGS EVERY 4 HOURS AS NEEDED. 06/10/23   Tharon Lung, MD  bictegravir-emtricitabine-tenofovir AF (BIKTARVY ) 50-200-25 MG TABS tablet Take 1 tablet by mouth daily. 10/17/23   Calone, Gregory D, FNP  Blood Glucose Monitoring Suppl (BLOOD GLUCOSE MONITOR SYSTEM) w/Device KIT 1 Device by Does not apply route daily before breakfast. 05/30/23   Tharon Lung, MD  Blood Glucose Monitoring Suppl DEVI 1 each by Does not apply route in the morning and at bedtime. May substitute to any manufacturer covered by patient's insurance. 05/30/23   Tharon Lung, MD  cetirizine  (ZYRTEC ) 10 MG tablet TAKE 1 TABLET(10 MG) BY MOUTH DAILY 10/28/23   Calone, Gregory D, FNP  clobetasol  cream (TEMOVATE ) 0.05 % Apply 1 Application topically 2 (two) times daily. 07/01/23   Calone, Gregory D, FNP  cyclobenzaprine  (FLEXERIL ) 5 MG tablet Take 1 tablet (5 mg total) by mouth at bedtime. 12/21/21   Calone, Gregory D, FNP  diclofenac  Sodium (VOLTAREN ) 1 % GEL Apply 4 g topically 4 (four) times daily as needed. 10/17/23   Calone, Gregory D, FNP  glipiZIDE  (GLUCOTROL  XL) 10 MG 24 hr tablet Take 1 tablet (10 mg total) by mouth daily. 06/10/23   Tharon Lung, MD  hydrocortisone  cream 1 % Apply to affected area 2 times daily 08/17/18   Henderly, Britni A, PA-C  Lancets Misc. (ACCU-CHEK SOFTCLIX LANCET DEV) KIT 1 each by Does not apply route daily before breakfast. 05/24/23   Tharon Lung, MD  losartan -hydrochlorothiazide  (HYZAAR) 50-12.5 MG tablet Take 1 tablet by mouth daily. 06/10/23   Tharon Lung, MD  metFORMIN  (GLUCOPHAGE -XR) 500 MG 24 hr tablet Take 2 tablets (1,000 mg total) by mouth daily with breakfast. 09/05/23   Thapa, [place], MD  naproxen  (NAPROSYN ) 500 MG tablet TAKE 1  TABLET(500 MG) BY MOUTH TWICE DAILY WITH A MEAL 07/08/23   Calone, Gregory D, FNP  nystatin  cream (MYCOSTATIN ) Apply to affected area 2 times daily 06/17/22   Banister, Pamela K, MD  omeprazole  (PRILOSEC) 40 MG capsule TAKE 1 CAPSULE(40 MG) BY MOUTH DAILY 07/22/23   Calone, Gregory D, FNP  pioglitazone  (ACTOS ) 15 MG tablet Take 2 tablets (30 mg total) by mouth daily. 09/05/23   Thapa, [place], MD  rizatriptan  (MAXALT ) 10 MG tablet TAKE 1/2-1 TABLET BY MOUTH AS NEEDED FOR MIGRAINE. MAY REPEAT IN 2 HOURS IF NEEDED. MAX OF 2 TABLETS PER DAY 07/18/23   Calone,  Cordella BIRCH, FNP  rosuvastatin  (CRESTOR ) 10 MG tablet Take 1 tablet (10 mg total) by mouth daily. 06/17/23   Tharon Lung, MD  topiramate  (TOPAMAX ) 50 MG tablet TAKE 1 TABLET(50 MG) BY MOUTH DAILY 06/17/23   Philemon Cordella BIRCH, FNP  valACYclovir  (VALTREX ) 500 MG tablet TAKE 1 TABLET(500 MG) BY MOUTH TWICE DAILY 04/30/23   Calone, Gregory D, FNP    Family History Family History  Problem Relation Age of Onset   Heart disease Mother    Arthritis Mother    Depression Mother    Bipolar disorder Mother    Cancer Father    Depression Father    Bipolar disorder Father    Alzheimer's disease Father     Social History Social History   Tobacco Use   Smoking status: Former    Types: Cigarettes   Smokeless tobacco: Never  Vaping Use   Vaping status: Some Days  Substance Use Topics   Alcohol use: No   Drug use: Yes    Types: Methamphetamines, Marijuana    Comment: states she does Crystal meth -- last used 2 days ago     Allergies   Patient has no known allergies.   Review of Systems Review of Systems  Per HPI  Physical Exam Triage Vital Signs ED Triage Vitals [12/19/23 2005]  Encounter Vitals Group     BP 126/81     Girls Systolic BP Percentile      Girls Diastolic BP Percentile      Boys Systolic BP Percentile      Boys Diastolic BP Percentile      Pulse Rate (!) 112     Resp 18     Temp 98.1 F (36.7 C)     Temp Source Oral      SpO2 99 %     Weight      Height      Head Circumference      Peak Flow      Pain Score 4     Pain Loc      Pain Education      Exclude from Growth Chart    No data found.  Updated Vital Signs BP 126/81 (BP Location: Right Arm)   Pulse (!) 112   Temp 98.1 F (36.7 C) (Oral)   Resp 18   LMP  (LMP Unknown)   SpO2 99%   Visual Acuity Right Eye Distance:   Left Eye Distance:   Bilateral Distance:    Right Eye Near:   Left Eye Near:    Bilateral Near:     Physical Exam Vitals and nursing note reviewed.  Constitutional:      General: She is awake. She is not in acute distress.    Appearance: Normal appearance. She is well-developed and well-groomed. She is not ill-appearing.  Skin:    Findings: Erythema and lesion present.     Comments: Blister noted to anterior left knee with surrounding erythema, induration, and warmth.  Neurological:     Mental Status: She is alert.  Psychiatric:        Behavior: Behavior is cooperative.      UC Treatments / Results  Labs (all labs ordered are listed, but only abnormal results are displayed) Labs Reviewed - No data to display  EKG   Radiology No results found.  Procedures Procedures (including critical care time)  Medications Ordered in UC Medications - No data to display  Initial Impression / Assessment and Plan / UC Course  I have reviewed the triage vital signs and the nursing notes.  Pertinent labs & imaging results that were available during my care of the patient were reviewed by me and considered in my medical decision making (see chart for details).     Patient is overall well-appearing.  Vitals are stable.  Exam findings consistent with cellulitis likely from insect bite.  Prescribed doxycycline  for cellulitis coverage.  Prescribed mupirocin  ointment for additional infection coverage.  Discussed proper wound care.  Discussed follow-up and strict return precautions. Final Clinical Impressions(s) / UC  Diagnoses   Final diagnoses:  Insect bite of left knee, initial encounter  Cellulitis of left lower extremity     Discharge Instructions      Start taking doxycycline  twice daily for 10 days for infection coverage related to insect bite. You can apply mupirocin  ointment to the area twice daily for additional infection prevention. Keep the area clean dry and covered. Return here if you notice spreading of redness, increased swelling, or lots of purulent drainage from the area. Otherwise follow-up with your primary care provider for further evaluation if needed.   ED Prescriptions     Medication Sig Dispense Auth. Provider   doxycycline  (VIBRAMYCIN ) 100 MG capsule Take 1 capsule (100 mg total) by mouth 2 (two) times daily. 20 capsule Johnie Flaming A, NP   mupirocin  ointment (BACTROBAN ) 2 % Apply 1 Application topically 2 (two) times daily. 22 g Johnie Flaming A, NP      PDMP not reviewed this encounter.   Johnie Flaming A, NP 12/19/23 2020

## 2023-12-19 NOTE — Discharge Instructions (Signed)
 Start taking doxycycline  twice daily for 10 days for infection coverage related to insect bite. You can apply mupirocin  ointment to the area twice daily for additional infection prevention. Keep the area clean dry and covered. Return here if you notice spreading of redness, increased swelling, or lots of purulent drainage from the area. Otherwise follow-up with your primary care provider for further evaluation if needed.

## 2023-12-25 ENCOUNTER — Encounter: Payer: Self-pay | Admitting: Endocrinology

## 2023-12-25 ENCOUNTER — Ambulatory Visit (INDEPENDENT_AMBULATORY_CARE_PROVIDER_SITE_OTHER): Admitting: Endocrinology

## 2023-12-25 ENCOUNTER — Ambulatory Visit: Payer: Self-pay | Admitting: Endocrinology

## 2023-12-25 VITALS — BP 122/80 | HR 113 | Ht 67.0 in | Wt 154.0 lb

## 2023-12-25 DIAGNOSIS — Z7984 Long term (current) use of oral hypoglycemic drugs: Secondary | ICD-10-CM

## 2023-12-25 DIAGNOSIS — E1165 Type 2 diabetes mellitus with hyperglycemia: Secondary | ICD-10-CM

## 2023-12-25 LAB — POCT GLYCOSYLATED HEMOGLOBIN (HGB A1C): Hemoglobin A1C: 9.4 % — AB (ref 4.0–5.6)

## 2023-12-25 MED ORDER — SITAGLIPTIN PHOSPHATE 100 MG PO TABS
100.0000 mg | ORAL_TABLET | Freq: Every day | ORAL | 4 refills | Status: AC
Start: 2023-12-25 — End: ?

## 2023-12-25 NOTE — Progress Notes (Signed)
 Outpatient Endocrinology Note [place] Aivan Fillingim, MD   Patient's Name: Kristin Hart    DOB: 1980-10-11    MRN: [id number]                                                    REASON OF VISIT: Follow-up for type 2 diabetes mellitus  REFERRING PROVIDER: Melvenia Corean SAILOR, NP  PCP: Tharon Lung, MD  HISTORY OF PRESENT ILLNESS:   Kristin Hart is a 43 y.o. old female with past medical history listed below, is here for follow-up for type 2 diabetes mellitus.   Pertinent Diabetes History: Patient was referred to endocrinology for uncontrolled type 2 diabetes mellitus, initial consult in June 11, 2023.  Patient was diagnosed with type 2 diabetes mellitus for 10+ years ago, at least from 2013 she is diabetic on metformin .  She had relatively controlled type 2 diabetes mellitus as of 2021.  She had significantly elevated hemoglobin A1c of 11.4% in October 2024 consistent with uncontrolled type 2 diabetes mellitus.  Denies history of diabetes ketoacidosis.  Patient reports history of recurrent vaginal yeast infection.  Patient prefers not to be on injectable antidiabetic medications including insulins due to phobia with needles.  She not aware family history of diabetes mellitus.    Previous diabetes education: no  No personal history of pancreatitis and / or family history of medullary thyroid  carcinoma or MEN 2B syndrome.   Chronic Diabetes Complications : Retinopathy: Unknown. Last ophthalmology exam was done on due, was referred to ophthalmology by PCP in December. Nephropathy: no, on ACE/ARB /losartan . Peripheral neuropathy: no Coronary artery disease: no Stroke: no  Relevant comorbidities and cardiovascular risk factors: Obesity: no Body mass index is 24.12 kg/m.  Hypertension: Yes  Hyperlipidemia : Yes, on statin   Current / Home Diabetic regimen includes:  Metformin  XR 500 mg 2 tabs daily. Glipizide  XR 10 mg daily.  Pioglitazone /Actos  30 mg daily.  Prior diabetic  medications: Pioglitazone  was added in June 2025.  She had GI intolerance with higher dose of metformin . She does not want to be on injectable antidiabetic medication including insulin and GLP-1 receptor agonist.  She has needle phobia. I will avoid SGLT2 inhibitor due to recurrent yeast infection.  Glycemic data:    She reports she has glucometer and test supplies at home.  She has not been checking blood sugar lately, no glucose data to review.  Hypoglycemia: Patient has on hypoglycemic episodes. Patient has hypoglycemia awareness.   Factors modifying glucose control: 1.  Diabetic diet assessment: 2 meals a day. Snacks, crackers, chips. Staying away from cakes. Limiting sodas.  Cannot afford healthy diet.  2.  Staying active or exercising: walking  3.  Medication compliance: compliant most of the time.  Interval history  Hemoglobin A1c improved to 9.4%, congratulated her.  She reports she is becoming better on taking medications and mostly compliant with diabetic medication as mentioned above.  No glucose data to review.  She has not noticed any increase swelling of the legs after being on pioglitazone .  She has no other complaints today.  REVIEW OF SYSTEMS As per history of present illness.   PAST MEDICAL HISTORY: Past Medical History:  Diagnosis Date   ADHD (attention deficit hyperactivity disorder)    Anxiety    Asthma    Borderline personality disorder (HCC)  with schizophrenic tendancies, per pt   Depression    Diabetes mellitus without complication (HCC)    type 2   Foreign body in small intestine    GERD (gastroesophageal reflux disease)    HIV (human immunodeficiency virus infection) (HCC)    Hypothyroidism    Stomach ulcer    Substance abuse (HCC)    Meth, IV drug use    PAST SURGICAL HISTORY: Past Surgical History:  Procedure Laterality Date   CESAREAN SECTION N/A 07/04/2016   Procedure: CESAREAN SECTION;  Surgeon: Lang JINNY Peel, DO;  Location: [facility]  BIRTHING SUITES;  Service: Obstetrics;  Laterality: N/A;   ENTEROSCOPY N/A 11/08/2017   Procedure: ENTEROSCOPY;  Surgeon: Abran Norleen SAILOR, MD;  Location: [facility] ENDOSCOPY;  Service: Endoscopy;  Laterality: N/A;   NO PAST SURGERIES      ALLERGIES: No Known Allergies  FAMILY HISTORY:  Family History  Problem Relation Age of Onset   Heart disease Mother    Arthritis Mother    Depression Mother    Bipolar disorder Mother    Cancer Father    Depression Father    Bipolar disorder Father    Alzheimer's disease Father     SOCIAL HISTORY: Social History   Socioeconomic History   Marital status: Single    Spouse name: Not on file   Number of children: 1   Years of education: Not on file   Highest education level: Not on file  Occupational History   Occupation: Unemployed   Tobacco Use   Smoking status: Former    Types: Cigarettes   Smokeless tobacco: Never  Vaping Use   Vaping status: Some Days  Substance and Sexual Activity   Alcohol use: No   Drug use: Yes    Types: Methamphetamines, Marijuana    Comment: states she does Crystal meth -- last used 2 days ago   Sexual activity: Yes    Partners: Male    Birth control/protection: Injection, Condom  Other Topics Concern   Not on file  Social History Narrative   Are you right handed or left handed? Right   Are you currently employed ? no   What is your current occupation?   Do you live at home alone? no   Who lives with you? Son and mother   What type of home do you live in: 1 story or 2 story? one    Caffeine 1-2 soda   Social Drivers of Health   Financial Resource Strain: Medium Risk (07/23/2023)   Overall Financial Resource Strain (CARDIA)    Difficulty of Paying Living Expenses: Somewhat hard  Food Insecurity: Food Insecurity Present (07/23/2023)   Hunger Vital Sign    Worried About Running Out of Food in the Last Year: Sometimes true    Ran Out of Food in the Last Year: Sometimes true  Transportation Needs: No  Transportation Needs (05/01/2023)   PRAPARE - Administrator, Civil Service (Medical): No    Lack of Transportation (Non-Medical): No  Physical Activity: Not on file  Stress: Not on file  Social Connections: Not on file    MEDICATIONS:  Current Outpatient Medications  Medication Sig Dispense Refill   Accu-Chek Softclix Lancets lancets Use as instructed 100 each 12   acetaminophen  (TYLENOL ) 500 MG tablet Take 1 tablet (500 mg total) by mouth every 6 (six) hours as needed. 30 tablet 0   albuterol  (VENTOLIN  HFA) 108 (90 Base) MCG/ACT inhaler INHALE 1 PUFF INTO THE LUNGS EVERY 4 HOURS  AS NEEDED. 18 each 2   bictegravir-emtricitabine-tenofovir AF (BIKTARVY ) 50-200-25 MG TABS tablet Take 1 tablet by mouth daily. 30 tablet 5   Blood Glucose Monitoring Suppl (BLOOD GLUCOSE MONITOR SYSTEM) w/Device KIT 1 Device by Does not apply route daily before breakfast. 1 kit 0   Blood Glucose Monitoring Suppl DEVI 1 each by Does not apply route in the morning and at bedtime. May substitute to any manufacturer covered by patient's insurance. 1 each 0   cetirizine  (ZYRTEC ) 10 MG tablet TAKE 1 TABLET(10 MG) BY MOUTH DAILY 30 tablet 3   clobetasol  cream (TEMOVATE ) 0.05 % Apply 1 Application topically 2 (two) times daily. 30 g 0   cyclobenzaprine  (FLEXERIL ) 5 MG tablet Take 1 tablet (5 mg total) by mouth at bedtime. 30 tablet 4   diclofenac  Sodium (VOLTAREN ) 1 % GEL Apply 4 g topically 4 (four) times daily as needed. 350 g 0   doxycycline  (VIBRAMYCIN ) 100 MG capsule Take 1 capsule (100 mg total) by mouth 2 (two) times daily. 20 capsule 0   glipiZIDE  (GLUCOTROL  XL) 10 MG 24 hr tablet Take 1 tablet (10 mg total) by mouth daily. 90 tablet 3   hydrocortisone  cream 1 % Apply to affected area 2 times daily 15 g 0   Lancets Misc. (ACCU-CHEK SOFTCLIX LANCET DEV) KIT 1 each by Does not apply route daily before breakfast. 1 kit 0   losartan -hydrochlorothiazide  (HYZAAR) 50-12.5 MG tablet Take 1 tablet by mouth  daily. 90 tablet 3   metFORMIN  (GLUCOPHAGE -XR) 500 MG 24 hr tablet Take 2 tablets (1,000 mg total) by mouth daily with breakfast. 180 tablet 3   mupirocin  ointment (BACTROBAN ) 2 % Apply 1 Application topically 2 (two) times daily. 22 g 0   naproxen  (NAPROSYN ) 500 MG tablet TAKE 1 TABLET(500 MG) BY MOUTH TWICE DAILY WITH A MEAL 60 tablet 2   nystatin  cream (MYCOSTATIN ) Apply to affected area 2 times daily 30 g 0   omeprazole  (PRILOSEC) 40 MG capsule TAKE 1 CAPSULE(40 MG) BY MOUTH DAILY 90 capsule 1   pioglitazone  (ACTOS ) 15 MG tablet Take 2 tablets (30 mg total) by mouth daily. 90 tablet 3   rizatriptan  (MAXALT ) 10 MG tablet TAKE 1/2-1 TABLET BY MOUTH AS NEEDED FOR MIGRAINE. MAY REPEAT IN 2 HOURS IF NEEDED. MAX OF 2 TABLETS PER DAY 10 tablet 1   rosuvastatin  (CRESTOR ) 10 MG tablet Take 1 tablet (10 mg total) by mouth daily. 90 tablet 3   sitaGLIPtin (JANUVIA) 100 MG tablet Take 1 tablet (100 mg total) by mouth daily. 90 tablet 4   topiramate  (TOPAMAX ) 50 MG tablet TAKE 1 TABLET(50 MG) BY MOUTH DAILY 30 tablet 5   valACYclovir  (VALTREX ) 500 MG tablet TAKE 1 TABLET(500 MG) BY MOUTH TWICE DAILY 60 tablet 5   No current facility-administered medications for this visit.    PHYSICAL EXAM: Vitals:   12/25/23 1520  BP: 122/80  Pulse: (!) 113  SpO2: 99%  Weight: 154 lb (69.9 kg)  Height: 5' 7 (1.702 m)   Body mass index is 24.12 kg/m.  Wt Readings from Last 3 Encounters:  12/25/23 154 lb (69.9 kg)  11/14/23 159 lb (72.1 kg)  10/17/23 161 lb (73 kg)    General: Well developed, well nourished female in no apparent distress.  HEENT: AT/[place], no external lesions.  Eyes: Conjunctiva clear and no icterus. Neck: Neck supple  Lungs: Respirations not labored Neurologic: Alert, oriented, normal speech Extremities / Skin: Dry.  Psychiatric: Does not appear depressed or anxious  Diabetic Foot Exam - Simple   No data filed     LABS Reviewed Lab Results  Component Value Date   HGBA1C 9.4  (A) 12/25/2023   HGBA1C 10.8 (A) 09/05/2023   HGBA1C 9.9 (A) 06/11/2023   No results found for: FRUCTOSAMINE Lab Results  Component Value Date   CHOL 179 03/14/2023   HDL 50 03/14/2023   LDLCALC 111 (H) 03/14/2023   TRIG 101 03/14/2023   CHOLHDL 3.6 03/14/2023   Lab Results  Component Value Date   MICRALBCREAT 10 03/25/2023   Lab Results  Component Value Date   CREATININE 0.92 10/02/2023   No results found for: GFR  ASSESSMENT / PLAN  1. Uncontrolled type 2 diabetes mellitus with hyperglycemia (HCC)     Diabetes Mellitus type 2, complicated by no known complication. - Diabetic status / severity: Uncontrolled.  Improving.  Lab Results  Component Value Date   HGBA1C 9.4 (A) 12/25/2023    - Hemoglobin A1c goal : <7%  Discussed in detail about diet plan, limiting portion, avoiding high carbohydrate meals including regular sodas.  She said not able to afford healthy diet.  Improvement on diabetes control.  Encouraged her for compliance with medications and diet control as much as possible.  Adjusted diabetes regimen as follows.  - Medications: See below.    I) continue metformin  extended release 500 mg 2 tabs daily.   II) continue glipizide  extended release 10 mg daily. III) continue pioglitazone  30 mg daily. IV) start Januvia 100 mg daily.  Advised to check cost and coverage with pharmacy, if expensive, okay not to take it.  Blood sugar in the morning fasting and at bedtime.  Asked to bring glucometer in the follow-up visit.  - Home glucose testing: In the morning fasting and at bedtime. - Discussed/ Gave Hypoglycemia treatment plan.  # Consult : Will refer to diabetic educator and dietitian.  # Annual urine for microalbuminuria/ creatinine ratio, no microalbuminuria currently, continue ACE/ARB /losartan . Last  Lab Results  Component Value Date   MICRALBCREAT 10 03/25/2023    # Foot check nightly.  # Annual dilated diabetic eye exams.  Advised to  make visit with ophthalmology for diabetic eye exam.  - Diet: Make healthy diabetic food choices, discussed in detail. - Life style / activity / exercise: Discussed.  Advised to walk at least.  2. Blood pressure  -  BP Readings from Last 1 Encounters:  12/25/23 122/80    - Control is in target. - No change in current plans.  3. Lipid status / Hyperlipidemia - Last  Lab Results  Component Value Date   LDLCALC 111 (H) 03/14/2023   - Continue pitavastatin  2 mg daily, managed by primary care provider.  Diagnoses and all orders for this visit:  Uncontrolled type 2 diabetes mellitus with hyperglycemia (HCC) -     POCT glycosylated hemoglobin (Hb A1C) -     sitaGLIPtin (JANUVIA) 100 MG tablet; Take 1 tablet (100 mg total) by mouth daily.    DISPOSITION Follow up in clinic in 3 months suggested.  Labs on the same day of the visit.   All questions answered and patient verbalized understanding of the plan.  Iraq Kashina Mecum, MD [facility] Endocrinology [facility] [address] [place], [address] [place], [place] [address] Phone # [phone]  At least part of this note was generated using voice recognition software. Inadvertent word errors may have occurred, which were not recognized during the proofreading process.

## 2023-12-25 NOTE — Patient Instructions (Signed)
 Diabetes regimen  Metformin  500 mg extended release 2 tablets daily.  Continue glipizide  extended release 10 mg daily.  Pioglitazone  Actos  30 mg daily.  Januvia 100 mg daily.     Blood sugar in the morning fasting and at bedtime.  Bring glucometer in the follow-up visit.

## 2023-12-27 ENCOUNTER — Other Ambulatory Visit: Payer: Self-pay | Admitting: Family

## 2023-12-27 DIAGNOSIS — K219 Gastro-esophageal reflux disease without esophagitis: Secondary | ICD-10-CM

## 2024-01-09 ENCOUNTER — Other Ambulatory Visit: Payer: Self-pay

## 2024-01-09 ENCOUNTER — Encounter: Payer: Self-pay | Admitting: Family

## 2024-01-09 ENCOUNTER — Ambulatory Visit (INDEPENDENT_AMBULATORY_CARE_PROVIDER_SITE_OTHER): Admitting: Family

## 2024-01-09 VITALS — BP 114/76 | HR 101 | Temp 97.7°F | Ht 67.0 in | Wt 160.0 lb

## 2024-01-09 DIAGNOSIS — B20 Human immunodeficiency virus [HIV] disease: Secondary | ICD-10-CM

## 2024-01-09 DIAGNOSIS — Z0000 Encounter for general adult medical examination without abnormal findings: Secondary | ICD-10-CM

## 2024-01-09 DIAGNOSIS — F1520 Other stimulant dependence, uncomplicated: Secondary | ICD-10-CM | POA: Diagnosis not present

## 2024-01-09 NOTE — Progress Notes (Unsigned)
 Brief Narrative   Patient ID: Kristin Hart, female    DOB: 1980/06/02, 43 y.o.   MRN: [id number]  Kristin Hart is a 43 y/o caucasian female diagnosed with HIV disease on 01/02/19 with risk factor of heterosexual contact and IV drug use. Initial viral load on 06/23/19 23,300 and CD4 count 577. Genotype with no significant drug resistance. Entered care at [facility] Stage 1. [id number] negative. No history of opportunistic infection. Sole medication regimen of Biktarvy    Subjective:   Chief Complaint  Patient presents with  . Follow-up    HPI:  Kristin Hart is a 43 y.o. female with HIV disease last seen on 11/14/23 with well controlled virus and good adherence and tolerance to Biktarvy . Viral load remains undetectable and CD4 count 825. Last injection of Depo-Provera  was 11/14/23. Here today for follow up.  Kristin Hart has been doing okay since her last office visit with good adherence and tolerance to Biktarvy   Spending a lot of time at the tent and fell about a week ago  Denies fevers, chills, night sweats, headaches, changes in vision, neck pain/stiffness, nausea, diarrhea, vomiting, lesions or rashes.  Lab Results  Component Value Date   CD4TCELL 58 10/17/2023   CD4TABS 825 10/17/2023   Lab Results  Component Value Date   HIV1RNAQUANT NOT DETECTED 10/17/2023     No Known Allergies    Outpatient Medications Prior to Visit  Medication Sig Dispense Refill  . acetaminophen  (TYLENOL ) 500 MG tablet Take 1 tablet (500 mg total) by mouth every 6 (six) hours as needed. 30 tablet 0  . albuterol  (VENTOLIN  HFA) 108 (90 Base) MCG/ACT inhaler INHALE 1 PUFF INTO THE LUNGS EVERY 4 HOURS AS NEEDED. 18 each 2  . bictegravir-emtricitabine-tenofovir AF (BIKTARVY ) 50-200-25 MG TABS tablet Take 1 tablet by mouth daily. 30 tablet 5  . cetirizine  (ZYRTEC ) 10 MG tablet TAKE 1 TABLET(10 MG) BY MOUTH DAILY 30 tablet 3  . clobetasol  cream (TEMOVATE ) 0.05 % Apply 1 Application topically 2 (two) times daily.  30 g 0  . glipiZIDE  (GLUCOTROL  XL) 10 MG 24 hr tablet Take 1 tablet (10 mg total) by mouth daily. 90 tablet 3  . hydrocortisone  cream 1 % Apply to affected area 2 times daily 15 g 0  . losartan -hydrochlorothiazide  (HYZAAR) 50-12.5 MG tablet Take 1 tablet by mouth daily. 90 tablet 3  . metFORMIN  (GLUCOPHAGE -XR) 500 MG 24 hr tablet Take 2 tablets (1,000 mg total) by mouth daily with breakfast. 180 tablet 3  . mupirocin  ointment (BACTROBAN ) 2 % Apply 1 Application topically 2 (two) times daily. 22 g 0  . naproxen  (NAPROSYN ) 500 MG tablet TAKE 1 TABLET(500 MG) BY MOUTH TWICE DAILY WITH A MEAL 60 tablet 2  . nystatin  cream (MYCOSTATIN ) Apply to affected area 2 times daily 30 g 0  . omeprazole  (PRILOSEC) 40 MG capsule TAKE 1 CAPSULE(40 MG) BY MOUTH DAILY 90 capsule 1  . pioglitazone  (ACTOS ) 15 MG tablet Take 2 tablets (30 mg total) by mouth daily. 90 tablet 3  . rizatriptan  (MAXALT ) 10 MG tablet TAKE 1/2-1 TABLET BY MOUTH AS NEEDED FOR MIGRAINE. MAY REPEAT IN 2 HOURS IF NEEDED. MAX OF 2 TABLETS PER DAY 10 tablet 1  . sitaGLIPtin (JANUVIA) 100 MG tablet Take 1 tablet (100 mg total) by mouth daily. 90 tablet 4  . topiramate  (TOPAMAX ) 50 MG tablet TAKE 1 TABLET(50 MG) BY MOUTH DAILY 30 tablet 5  . valACYclovir  (VALTREX ) 500 MG tablet TAKE 1 TABLET(500 MG) BY MOUTH TWICE DAILY  60 tablet 5  . Accu-Chek Softclix Lancets lancets Use as instructed 100 each 12  . Blood Glucose Monitoring Suppl (BLOOD GLUCOSE MONITOR SYSTEM) w/Device KIT 1 Device by Does not apply route daily before breakfast. 1 kit 0  . Blood Glucose Monitoring Suppl DEVI 1 each by Does not apply route in the morning and at bedtime. May substitute to any manufacturer covered by patient's insurance. 1 each 0  . cyclobenzaprine  (FLEXERIL ) 5 MG tablet Take 1 tablet (5 mg total) by mouth at bedtime. (Patient not taking: Reported on 01/09/2024) 30 tablet 4  . diclofenac  Sodium (VOLTAREN ) 1 % GEL Apply 4 g topically 4 (four) times daily as needed.  (Patient not taking: Reported on 01/09/2024) 350 g 0  . doxycycline  (VIBRAMYCIN ) 100 MG capsule Take 1 capsule (100 mg total) by mouth 2 (two) times daily. (Patient not taking: Reported on 01/09/2024) 20 capsule 0  . Lancets Misc. (ACCU-CHEK SOFTCLIX LANCET DEV) KIT 1 each by Does not apply route daily before breakfast. (Patient not taking: Reported on 01/09/2024) 1 kit 0  . rosuvastatin  (CRESTOR ) 10 MG tablet Take 1 tablet (10 mg total) by mouth daily. (Patient not taking: Reported on 01/09/2024) 90 tablet 3   No facility-administered medications prior to visit.     Past Medical History:  Diagnosis Date  . ADHD (attention deficit hyperactivity disorder)   . Anxiety   . Asthma   . 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 Depression   . Diabetes mellitus without complication (HCC)    type 2  . Foreign body in small intestine   . GERD (gastroesophageal reflux disease)   . HIV (human immunodeficiency virus infection) (HCC)   . Hypothyroidism   . Stomach ulcer   . Substance abuse (HCC)    Meth, IV drug use     Past Surgical History:  Procedure Laterality Date  . CESAREAN SECTION N/A 07/04/2016   Procedure: CESAREAN SECTION;  Surgeon: Lang JINNY Peel, DO;  Location: [facility] BIRTHING SUITES;  Service: Obstetrics;  Laterality: N/A;  . ENTEROSCOPY N/A 11/08/2017   Procedure: ENTEROSCOPY;  Surgeon: Abran Norleen SAILOR, MD;  Location: [facility] ENDOSCOPY;  Service: Endoscopy;  Laterality: N/A;  . NO PAST SURGERIES          Review of Systems   Objective:   BP 114/76   Pulse (!) 101   Temp 97.7 F (36.5 C) (Oral)   Ht 5' 7 (1.702 m)   Wt 160 lb (72.6 kg)   LMP  (LMP Unknown)   SpO2 100%   BMI 25.06 kg/m  Nursing note and vital signs reviewed.  Physical Exam       11/14/2023    3:25 PM 10/17/2023    3:57 PM 08/29/2023    2:01 PM 06/10/2023    4:46 PM 04/29/2023    3:50 PM  Depression screen PHQ 2/9  Decreased Interest 1 1 0 1 0  Down,  Depressed, Hopeless 1 2 0 1 1  PHQ - 2 Score 2 3 0 2 1  Altered sleeping 0 2 0 2   Tired, decreased energy 1 2 0 1   Change in appetite 0 0 0 1   Feeling bad or failure about yourself  1 3 0 1   Trouble concentrating 0 1 0 2   Moving slowly or fidgety/restless 0 0 0 2   Suicidal thoughts 0 0 0 0   PHQ-9 Score 4 11 0 11   Difficult  doing work/chores Somewhat difficult Somewhat difficult  Somewhat difficult         11/14/2023    3:26 PM 10/17/2023    3:57 PM 08/29/2023    2:02 PM 11/29/2021    1:33 PM  GAD 7 : Generalized Anxiety Score  Nervous, Anxious, on Edge 1 2 0 1  Control/stop worrying 1 2 0 1  Worry too much - different things 1 1 0 1  Trouble relaxing 1 1 0 1  Restless 0 0 0 0  Easily annoyed or irritable 2 1 0 1  Afraid - awful might happen 0 0 0 0  Total GAD 7 Score 6 7 0 5  Anxiety Difficulty Somewhat difficult Somewhat difficult       The 10-year ASCVD risk score (Arnett DK, et al., 2019) is: 4.8%   Values used to calculate the score:     Age: 89 years     Clincally relevant sex: Female     Is Non-Hispanic African American: No     Diabetic: Yes     Tobacco smoker: Yes     Systolic Blood Pressure: 114 mmHg     Is BP treated: Yes     HDL Cholesterol: 50 mg/dL     Total Cholesterol: 179 mg/dL      Assessment & Plan:    Patient Active Problem List   Diagnosis Date Noted  . Callus of toe 11/15/2023  . Diabetic polyneuropathy associated with type 2 diabetes mellitus (HCC) 10/17/2023  . Nausea 08/29/2023  . Poison ivy 07/01/2023  . Infection of left hand 04/24/2023  . Dry skin 03/12/2023  . Diarrhea 12/12/2022  . Lower abdominal pain 05/28/2022  . Post-nasal drip 05/28/2022  . Plantar fasciitis, right 03/19/2022  . Numbness and tingling in both hands 03/19/2022  . Poor social situation 10/25/2021  . Axillary abscess 09/20/2021  . Therapeutic drug monitoring 08/24/2021  . Birth control counseling 12/20/2020  . Hypertension 09/21/2020  . Grief 08/29/2020   . Encounter for surveillance of injectable contraceptive 07/29/2020  . 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 Screening for cervical cancer 04/21/2020  . Screening for STDs (sexually transmitted diseases) 04/21/2020  . Costochondritis 02/19/2020  . Bilateral hip pain 02/19/2020  . Elbow pain, right 12/04/2019  . Housing problems 11/23/2019  . Mental health disorder 10/12/2019  . 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  . Paresthesias 10/12/2019  . Healthcare maintenance 08/05/2019  . HIV disease (HCC) 12/31/2018  . Anxiety 05/29/2017  . Self-mutilation 05/29/2017  . Depression 06/07/2016  . Marijuana use 06/07/2016  . Low vitamin D  level 03/15/2016  . Type 2 diabetes mellitus with hyperglycemia (HCC) 02/22/2016  . Asthma 02/22/2016  . ADD (attention deficit disorder) 02/22/2016  . Methamphetamine use disorder, severe, 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/22/2016  . Hyperlipidemia with target LDL less than 70 01/31/2012  . Migraine 01/31/2012  . Tremor, 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     Problem List Items Addressed This Visit   None    I am having Kristin SAUNDERS. Agredano maintain her hydrocortisone  cream, cyclobenzaprine , acetaminophen , nystatin  cream, valACYclovir , Accu-Chek Softclix Lancet Dev, Blood Glucose Monitor System, Blood Glucose Monitoring Suppl, glipiZIDE , albuterol , Accu-Chek Softclix Lancets, losartan -hydrochlorothiazide , topiramate , rosuvastatin , clobetasol  cream, naproxen , rizatriptan , metFORMIN , pioglitazone , Biktarvy , diclofenac  Sodium, cetirizine , doxycycline , mupirocin  ointment, sitaGLIPtin, and omeprazole .   No orders of the defined types were placed in this encounter.    Follow-up: No follow-ups on file. or sooner if needed.    Greg Maryagnes Carrasco, MSN, FNP-C Nurse Practitioner [facility] Main number: [phone]

## 2024-01-09 NOTE — Patient Instructions (Signed)
 Nice to see you.  Continue to take your medication daily as prescribed.  Refills have been sent to the pharmacy.  Please call [facility]) to schedule/follow up on your dental care at [phone]  For your foot recommend a donut pad or corn type pad  Plan for follow up in 1 months or sooner if needed with lab work on the same day.  Have a great day and stay safe!

## 2024-01-10 ENCOUNTER — Encounter: Payer: Self-pay | Admitting: Family

## 2024-01-10 NOTE — Assessment & Plan Note (Signed)
 Ms. Ditmars continues to have well-controlled virus with good adherence and tolerance to Biktarvy .  Reviewed previous lab work and discussed plan of care and U equals U.  Continue current dose of Biktarvy .  Social determinants of health reviewed with interventions updated and continues to work with case management.  Plan for follow-up in 1 month or sooner if needed.

## 2024-01-10 NOTE — Assessment & Plan Note (Signed)
 Discussed importance of safe sexual practice and condom use. Condoms and site specific STD testing offered.  Vaccinations reviewed and declined following counseling. Follows with [facility] dentistry and working on scheduling a follow up appointment. On Depo-provera  for birth control next due in December.  Due for mammogram with cervical cancer screening up to date.

## 2024-01-10 NOTE — Assessment & Plan Note (Signed)
 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 Discussed continued risk on health and is not interested in quitting at this time.

## 2024-02-11 ENCOUNTER — Ambulatory Visit: Admitting: Family

## 2024-02-11 ENCOUNTER — Other Ambulatory Visit: Payer: Self-pay

## 2024-02-11 VITALS — BP 138/84 | HR 98 | Temp 98.0°F | Resp 16 | Wt 166.6 lb

## 2024-02-11 DIAGNOSIS — B20 Human immunodeficiency virus [HIV] disease: Secondary | ICD-10-CM

## 2024-02-11 DIAGNOSIS — F1520 Other stimulant dependence, uncomplicated: Secondary | ICD-10-CM

## 2024-02-11 DIAGNOSIS — Z79899 Other long term (current) drug therapy: Secondary | ICD-10-CM

## 2024-02-11 DIAGNOSIS — Z3009 Encounter for other general counseling and advice on contraception: Secondary | ICD-10-CM | POA: Diagnosis not present

## 2024-02-11 DIAGNOSIS — Z0000 Encounter for general adult medical examination without abnormal findings: Secondary | ICD-10-CM

## 2024-02-11 MED ORDER — MEDROXYPROGESTERONE ACETATE 150 MG/ML IM SUSP
150.0000 mg | Freq: Once | INTRAMUSCULAR | Status: AC
  Administered 2024-02-11: 150 mg via INTRAMUSCULAR

## 2024-02-11 NOTE — Progress Notes (Unsigned)
 Brief Narrative   Patient ID: Kristin Hart, female    DOB: [birth year], 43 y.o.   MRN: [id number]  Ms. Curl is a 43 y/o caucasian female diagnosed with HIV disease on 01/02/19 with risk factor of heterosexual contact and IV drug use. Initial viral load on 06/23/19 23,300 and CD4 count 577. Genotype with no significant drug resistance. Entered care at [facility] Stage 1. [id number] negative. No history of opportunistic infection. Sole medication regimen of Biktarvy    Subjective:   No chief complaint on file.   HPI:  Kristin Hart is a 43 y.o. female with HIV disease last seen on     Denies fevers, chills, night sweats, headaches, changes in vision, neck pain/stiffness, nausea, diarrhea, vomiting, lesions or rashes.  Lab Results  Component Value Date   CD4TCELL 58 10/17/2023   CD4TABS 825 10/17/2023   Lab Results  Component Value Date   HIV1RNAQUANT NOT DETECTED 10/17/2023     No Known Allergies    Outpatient Medications Prior to Visit  Medication Sig Dispense Refill   Accu-Chek Softclix Lancets lancets Use as instructed 100 each 12   acetaminophen  (TYLENOL ) 500 MG tablet Take 1 tablet (500 mg total) by mouth every 6 (six) hours as needed. 30 tablet 0   albuterol  (VENTOLIN  HFA) 108 (90 Base) MCG/ACT inhaler INHALE 1 PUFF INTO THE LUNGS EVERY 4 HOURS AS NEEDED. 18 each 2   bictegravir-emtricitabine-tenofovir AF (BIKTARVY ) 50-200-25 MG TABS tablet Take 1 tablet by mouth daily. 30 tablet 5   Blood Glucose Monitoring Suppl (BLOOD GLUCOSE MONITOR SYSTEM) w/Device KIT 1 Device by Does not apply route daily before breakfast. 1 kit 0   Blood Glucose Monitoring Suppl DEVI 1 each by Does not apply route in the morning and at bedtime. May substitute to any manufacturer covered by patient's insurance. 1 each 0   cetirizine  (ZYRTEC ) 10 MG tablet TAKE 1 TABLET(10 MG) BY MOUTH DAILY 30 tablet 3   clobetasol  cream (TEMOVATE ) 0.05 % Apply 1 Application topically 2 (two) times daily. 30 g 0    doxycycline  (VIBRAMYCIN ) 100 MG capsule Take 1 capsule (100 mg total) by mouth 2 (two) times daily. (Patient not taking: Reported on 01/09/2024) 20 capsule 0   glipiZIDE  (GLUCOTROL  XL) 10 MG 24 hr tablet Take 1 tablet (10 mg total) by mouth daily. 90 tablet 3   hydrocortisone  cream 1 % Apply to affected area 2 times daily 15 g 0   Lancets Misc. (ACCU-CHEK SOFTCLIX LANCET DEV) KIT 1 each by Does not apply route daily before breakfast. (Patient not taking: Reported on 01/09/2024) 1 kit 0   losartan -hydrochlorothiazide  (HYZAAR) 50-12.5 MG tablet Take 1 tablet by mouth daily. 90 tablet 3   metFORMIN  (GLUCOPHAGE -XR) 500 MG 24 hr tablet Take 2 tablets (1,000 mg total) by mouth daily with breakfast. 180 tablet 3   mupirocin  ointment (BACTROBAN ) 2 % Apply 1 Application topically 2 (two) times daily. 22 g 0   naproxen  (NAPROSYN ) 500 MG tablet TAKE 1 TABLET(500 MG) BY MOUTH TWICE DAILY WITH A MEAL 60 tablet 2   nystatin  cream (MYCOSTATIN ) Apply to affected area 2 times daily 30 g 0   omeprazole  (PRILOSEC) 40 MG capsule TAKE 1 CAPSULE(40 MG) BY MOUTH DAILY 90 capsule 1   pioglitazone  (ACTOS ) 15 MG tablet Take 2 tablets (30 mg total) by mouth daily. 90 tablet 3   rizatriptan  (MAXALT ) 10 MG tablet TAKE 1/2-1 TABLET BY MOUTH AS NEEDED FOR MIGRAINE. MAY REPEAT IN 2 HOURS IF NEEDED. MAX  OF 2 TABLETS PER DAY 10 tablet 1   rosuvastatin  (CRESTOR ) 10 MG tablet Take 1 tablet (10 mg total) by mouth daily. (Patient not taking: Reported on 01/09/2024) 90 tablet 3   sitaGLIPtin  (JANUVIA ) 100 MG tablet Take 1 tablet (100 mg total) by mouth daily. 90 tablet 4   topiramate  (TOPAMAX ) 50 MG tablet TAKE 1 TABLET(50 MG) BY MOUTH DAILY 30 tablet 5   valACYclovir  (VALTREX ) 500 MG tablet TAKE 1 TABLET(500 MG) BY MOUTH TWICE DAILY 60 tablet 5   No facility-administered medications prior to visit.     Past Medical History:  Diagnosis Date   ADHD (attention deficit hyperactivity disorder)    Anxiety    Asthma    Borderline  personality disorder (HCC)    with schizophrenic tendancies, per pt   Depression    Diabetes mellitus without complication (HCC)    type 2   Foreign body in small intestine    GERD (gastroesophageal reflux disease)    HIV (human immunodeficiency virus infection) (HCC)    Hypothyroidism    Stomach ulcer    Substance abuse (HCC)    Meth, IV drug use     Past Surgical History:  Procedure Laterality Date   CESAREAN SECTION N/A 07/04/2016   Procedure: CESAREAN SECTION;  Surgeon: Lang JINNY Peel, DO;  Location: [facility] BIRTHING SUITES;  Service: Obstetrics;  Laterality: N/A;   ENTEROSCOPY N/A 11/08/2017   Procedure: ENTEROSCOPY;  Surgeon: Abran Norleen SAILOR, MD;  Location: [facility] ENDOSCOPY;  Service: Endoscopy;  Laterality: N/A;   NO PAST SURGERIES          Review of Systems   Objective:   There were no vitals taken for this visit. Nursing note and vital signs reviewed.  Physical Exam       11/14/2023    3:25 PM 10/17/2023    3:57 PM 08/29/2023    2:01 PM 06/10/2023    4:46 PM 04/29/2023    3:50 PM  Depression screen PHQ 2/9  Decreased Interest 1 1 0 1 0  Down, Depressed, Hopeless 1 2 0 1 1  PHQ - 2 Score 2 3 0 2 1  Altered sleeping 0 2 0 2   Tired, decreased energy 1 2 0 1   Change in appetite 0 0 0 1   Feeling bad or failure about yourself  1 3 0 1   Trouble concentrating 0 1 0 2   Moving slowly or fidgety/restless 0 0 0 2   Suicidal thoughts 0 0 0 0   PHQ-9 Score 4  11  0  11    Difficult doing work/chores Somewhat difficult Somewhat difficult  Somewhat difficult      Data saved with a previous flowsheet row definition        11/14/2023    3:26 PM 10/17/2023    3:57 PM 08/29/2023    2:02 PM 11/29/2021    1:33 PM  GAD 7 : Generalized Anxiety Score  Nervous, Anxious, on Edge 1 2 0 1  Control/stop worrying 1 2 0 1  Worry too much - different things 1 1 0 1  Trouble relaxing 1 1 0 1  Restless 0 0 0 0  Easily annoyed or irritable 2 1 0 1  Afraid - awful might happen 0 0 0 0   Total GAD 7 Score 6 7 0 5  Anxiety Difficulty Somewhat difficult Somewhat difficult       The 10-year ASCVD risk score (Arnett DK, et al., 2019) is:  4.8%   Values used to calculate the score:     Age: 71 years     Clincally relevant sex: Female     Is Non-Hispanic African American: No     Diabetic: Yes     Tobacco smoker: Yes     Systolic Blood Pressure: 114 mmHg     Is BP treated: Yes     HDL Cholesterol: 50 mg/dL     Total Cholesterol: 179 mg/dL      Assessment & Plan:    Patient Active Problem List   Diagnosis Date Noted   Callus of toe 11/15/2023   Diabetic polyneuropathy associated with type 2 diabetes mellitus (HCC) 10/17/2023   Nausea 08/29/2023   Poison ivy 07/01/2023   Infection of left hand 04/24/2023   Dry skin 03/12/2023   Diarrhea 12/12/2022   Lower abdominal pain 05/28/2022   Post-nasal drip 05/28/2022   Plantar fasciitis, right 03/19/2022   Numbness and tingling in both hands 03/19/2022   Poor social situation 10/25/2021   Axillary abscess 09/20/2021   Therapeutic drug monitoring 08/24/2021   Birth control counseling 12/20/2020   Hypertension 09/21/2020   Grief 08/29/2020   Encounter for surveillance of injectable contraceptive 07/29/2020   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   Screening for cervical cancer 04/21/2020   Screening for STDs (sexually transmitted diseases) 04/21/2020   Costochondritis 02/19/2020   Bilateral hip pain 02/19/2020   Elbow pain, right 12/04/2019   Housing problems 11/23/2019   Mental health disorder 10/12/2019   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   Paresthesias 10/12/2019   Healthcare maintenance 08/05/2019   HIV disease (HCC) 12/31/2018   Anxiety 05/29/2017   Self-mutilation 05/29/2017   Depression 06/07/2016   Marijuana use 06/07/2016   Low vitamin D  level 03/15/2016   Type 2 diabetes mellitus with hyperglycemia (HCC) 02/22/2016   Asthma 02/22/2016   ADD (attention deficit disorder)  02/22/2016   Methamphetamine use disorder, severe, 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/22/2016   Hyperlipidemia with target LDL less than 70 01/31/2012   Migraine 01/31/2012   Tremor, 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     Problem List Items Addressed This Visit   None    I am having Rosina R. Probus maintain her hydrocortisone  cream, acetaminophen , nystatin  cream, valACYclovir , Accu-Chek Softclix Lancet Dev, Blood Glucose Monitor System, Blood Glucose Monitoring Suppl, glipiZIDE , albuterol , Accu-Chek Softclix Lancets, losartan -hydrochlorothiazide , topiramate , rosuvastatin , clobetasol  cream, naproxen , rizatriptan , metFORMIN , pioglitazone , Biktarvy , cetirizine , doxycycline , mupirocin  ointment, sitaGLIPtin , and omeprazole .   No orders of the defined types were placed in this encounter.    Follow-up: No follow-ups on file. or sooner if needed.    Cathlyn July, MSN, FNP-C Nurse Practitioner [facility] Main number: [phone]

## 2024-02-11 NOTE — Patient Instructions (Addendum)
Nice to see you.  Continue to take your medication daily as prescribed.  Refills have been sent to the pharmacy.  Plan for follow up in 1 months or sooner if needed with lab work on the same day.  Have a great day and stay safe!  

## 2024-02-12 ENCOUNTER — Encounter: Payer: Self-pay | Admitting: Family

## 2024-02-12 NOTE — Assessment & Plan Note (Signed)
 Continues to use methamphetamine which is presenting several social challenges including obtaining housing and employment. Discussed rehabilitation and not interested at this time and counseled on importance of cessation.

## 2024-02-12 NOTE — Assessment & Plan Note (Signed)
 Discussed importance of safe sexual practice and condom use. Condoms and site specific STD testing offered.  Vaccinations reviewed and declined following counseling.  Working on dealer care.  Due for breast cancer screening and will discuss at next visit.

## 2024-02-12 NOTE — Assessment & Plan Note (Addendum)
 Kristin Hart continues to have well-controlled virus with good adherence and tolerance to Biktarvy .  Reviewed previous lab work and discussed plan of care and U equals U.  No problems obtaining medication from the pharmacy and covered by Medicaid.  Housing remains a challenge and will work with case management to see if there is any tents or other options available.  Plan for blood work in 1 month.  Continue current dose of Biktarvy .  Plan for follow-up in 1 month or sooner if needed.

## 2024-02-12 NOTE — Assessment & Plan Note (Signed)
 Depo-provera  provided today without complication. Continue Depo-provera  for birth control.

## 2024-03-17 ENCOUNTER — Other Ambulatory Visit: Payer: Self-pay | Admitting: Family

## 2024-03-18 NOTE — Telephone Encounter (Signed)
 Continue filling or defer to PCP? PCP note mentions trying to reduce naproxen  use and possibly referring to sports medicine

## 2024-03-24 ENCOUNTER — Telehealth: Payer: Self-pay

## 2024-03-24 NOTE — Telephone Encounter (Signed)
 Patient returned call.

## 2024-03-25 ENCOUNTER — Ambulatory Visit: Admitting: Endocrinology

## 2024-03-26 ENCOUNTER — Ambulatory Visit: Admitting: Endocrinology

## 2024-03-26 ENCOUNTER — Ambulatory Visit: Payer: Self-pay | Admitting: Endocrinology

## 2024-03-26 ENCOUNTER — Other Ambulatory Visit: Payer: Self-pay | Admitting: Family

## 2024-03-26 ENCOUNTER — Encounter: Payer: Self-pay | Admitting: Endocrinology

## 2024-03-26 ENCOUNTER — Other Ambulatory Visit

## 2024-03-26 VITALS — BP 132/80 | HR 107 | Resp 16 | Ht 67.0 in | Wt 169.0 lb

## 2024-03-26 DIAGNOSIS — Z7984 Long term (current) use of oral hypoglycemic drugs: Secondary | ICD-10-CM

## 2024-03-26 DIAGNOSIS — E1165 Type 2 diabetes mellitus with hyperglycemia: Secondary | ICD-10-CM | POA: Diagnosis not present

## 2024-03-26 LAB — POCT GLYCOSYLATED HEMOGLOBIN (HGB A1C): Hemoglobin A1C: 8.9 % — AB (ref 4.0–5.6)

## 2024-03-26 LAB — MICROALBUMIN / CREATININE URINE RATIO
Creatinine, Urine: 138 mg/dL (ref 20–275)
Microalb Creat Ratio: 7 mg/g{creat}
Microalb, Ur: 0.9 mg/dL

## 2024-03-26 MED ORDER — BLOOD GLUCOSE TEST VI STRP: 1.0000 | ORAL_STRIP | Freq: Every day | 3 refills | Status: AC

## 2024-03-26 MED ORDER — GLIPIZIDE ER 10 MG PO TB24: 10.0000 mg | ORAL_TABLET | Freq: Every day | ORAL | 3 refills | Status: AC

## 2024-03-26 MED ORDER — LANCETS MISC: 1.0000 | 0 refills | Status: AC

## 2024-03-26 MED ORDER — LANCET DEVICE MISC: 1.0000 | Freq: Every day | 0 refills | Status: AC

## 2024-03-26 MED ORDER — PIOGLITAZONE HCL 30 MG PO TABS: 30.0000 mg | ORAL_TABLET | Freq: Every day | ORAL | 3 refills | Status: AC

## 2024-03-26 MED ORDER — BLOOD GLUCOSE MONITORING SUPPL DEVI: 1.0000 | Freq: Every day | 0 refills | Status: AC

## 2024-03-26 NOTE — Progress Notes (Signed)
 "  Outpatient Endocrinology Note [name], MD   Patient's Name: Kristin Hart    DOB: 08-Apr-1980    MRN: [id number]                                                    REASON OF VISIT: Follow-up for type 2 diabetes mellitus  REFERRING PROVIDER: Melvenia Corean SAILOR, NP  PCP: Tharon Lung, MD  HISTORY OF PRESENT ILLNESS:   Kristin Hart is a 44 y.o. old female with past medical history listed below, is here for follow-up for type 2 diabetes mellitus.   Pertinent Diabetes History: Patient was referred to endocrinology for uncontrolled type 2 diabetes mellitus, initial consult in June 11, 2023.  Patient was diagnosed with type 2 diabetes mellitus for 10+ years ago, at least from 2013 she is diabetic on metformin .  She had relatively controlled type 2 diabetes mellitus as of 2021.  She had significantly elevated hemoglobin A1c of 11.4% in October 2024 consistent with uncontrolled type 2 diabetes mellitus.  Denies history of diabetes ketoacidosis.  Patient reports history of recurrent vaginal yeast infection.  Patient prefers not to be on injectable antidiabetic medications including insulins due to phobia with needles.  She not aware family history of diabetes mellitus.    Previous diabetes education: no  No personal history of pancreatitis and / or family history of medullary thyroid  carcinoma or MEN 2B syndrome.   Chronic Diabetes Complications : Retinopathy: Unknown. Last ophthalmology exam was done on due, was referred to ophthalmology by PCP in December. Nephropathy: no, on ACE/ARB /losartan . Peripheral neuropathy: no Coronary artery disease: no Stroke: no  Relevant comorbidities and cardiovascular risk factors: Obesity: no Body mass index is 26.47 kg/m.  Hypertension: Yes  Hyperlipidemia : Yes, on statin   Current / Home Diabetic regimen includes:  Metformin  XR 500 mg 2 tabs daily. Glipizide  XR 10 mg daily.  Pioglitazone /Actos  30 mg daily. Januvia  100 mg  daily.  Prior diabetic medications: Pioglitazone  was added in June 2025.  She had GI intolerance with higher dose of metformin . She does not want to be on injectable antidiabetic medication including insulin and GLP-1 receptor agonist.  She has needle phobia. I will avoid SGLT2 inhibitor due to recurrent yeast infection.  Glycemic data:   No glucose data to review.  She has not been checking blood sugar lately.  She might have misplaced her glucometer.  Hypoglycemia: Patient has no hypoglycemic episodes. Patient has hypoglycemia awareness.   Factors modifying glucose control: 1.  Diabetic diet assessment: 2 meals a day. Snacks, crackers, chips. Staying away from cakes. Limiting sodas.  Cannot afford healthy diet.  2.  Staying active or exercising: walking  3.  Medication compliance: compliant most of the time.  Interval history  Hemoglobin A1c slightly improved to 8.9% today.  Diabetes has been as reviewed and noted above.  No glucose data to review.  She reports she has not been checking blood sugar lately, probably might have misplaced glucometer. No other complaints today.  REVIEW OF SYSTEMS As per history of present illness.   PAST MEDICAL HISTORY: 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, per pt   Depression  Diabetes mellitus without complication (HCC)    type 2   Foreign body in small intestine    GERD (gastroesophageal reflux disease)    HIV (human immunodeficiency virus infection) (HCC)    Hypothyroidism    Stomach ulcer    Substance abuse (HCC)    Meth, IV drug use    PAST SURGICAL HISTORY: Past Surgical History:  Procedure Laterality Date   CESAREAN SECTION N/A 07/04/2016   Procedure: CESAREAN SECTION;  Surgeon: Lang JINNY Peel, DO;  Location: [facility] BIRTHING SUITES;  Service: Obstetrics;  Laterality: N/A;    ENTEROSCOPY N/A 11/08/2017   Procedure: ENTEROSCOPY;  Surgeon: Abran Norleen SAILOR, MD;  Location: [facility] ENDOSCOPY;  Service: Endoscopy;  Laterality: N/A;   NO PAST SURGERIES      ALLERGIES: No Known Allergies  FAMILY HISTORY:  Family History  Problem Relation Age of Onset   Heart disease Mother    Arthritis Mother    Depression Mother    Bipolar disorder Mother    Cancer Father    Depression Father    Bipolar disorder Father    Alzheimer's disease Father     SOCIAL HISTORY: Social History   Socioeconomic History   Marital status: Single    Spouse name: Not on file   Number of children: 1   Years of education: Not on file   Highest education level: Not on file  Occupational History   Occupation: Unemployed   Tobacco Use   Smoking status: Every Day    Types: E-cigarettes   Smokeless tobacco: Never  Vaping Use   Vaping status: Some Days  Substance and Sexual Activity   Alcohol use: No   Drug use: Yes    Types: Methamphetamines, Marijuana    Comment: methamphetamine, last use 2 days ago as of 01/09/24   Sexual activity: Yes    Partners: Male    Birth control/protection: Injection, Condom    Comment: condoms provided  Other Topics Concern   Not on file  Social History Narrative   Are you right handed or left handed? Right   Are you currently employed ? no   What is your current occupation?   Do you live at home alone? no   Who lives with you? Son and mother   What type of home do you live in: 1 story or 2 story? one    Caffeine 1-2 soda   Social Drivers of Health   Tobacco Use: High Risk (03/26/2024)   Patient History    Smoking Tobacco Use: Every Day    Smokeless Tobacco Use: Never    Passive Exposure: Not on file  Financial Resource Strain: Medium Risk (07/23/2023)   Overall Financial Resource Strain (CARDIA)    Difficulty of Paying Living Expenses: Somewhat hard  Food Insecurity: Food Insecurity Present (07/23/2023)   Hunger Vital Sign    Worried About Running  Out of Food in the Last Year: Sometimes true    Ran Out of Food in the Last Year: Sometimes true  Transportation Needs: No Transportation Needs (05/01/2023)   PRAPARE - Administrator, Civil Service (Medical): No    Lack of Transportation (Non-Medical): No  Physical Activity: Not on file  Stress: Not on file  Social Connections: Not on file  Depression (PHQ2-9): Low Risk (11/14/2023)   Depression (PHQ2-9)    PHQ-2 Score: 4  Recent Concern: Depression (PHQ2-9) - High Risk (10/17/2023)   Depression (PHQ2-9)    PHQ-2 Score: 11  Alcohol Screen: Not on file  Housing: Unknown (07/23/2023)   Housing Stability Vital Sign    Unable to Pay for Housing in the Last Year: No    Number of Times Moved in the Last Year: Not on file    Homeless in the Last Year: No  Recent Concern: Housing - High Risk (05/01/2023)   Housing Stability Vital Sign    Unable to Pay for Housing in the Last Year: Yes    Number of Times Moved in the Last Year: Not on file    Homeless in the Last Year: Yes  Utilities: Not At Risk (07/23/2023)   [facility] Utilities    Threatened with loss of utilities: No  Recent Concern: Utilities - At Risk (05/01/2023)   [facility] Utilities    Threatened with loss of utilities: Yes  Health Literacy: Not on file    MEDICATIONS:  Current Outpatient Medications  Medication Sig Dispense Refill   Accu-Chek Softclix Lancets lancets Use as instructed 100 each 12   acetaminophen  (TYLENOL ) 500 MG tablet Take 1 tablet (500 mg total) by mouth every 6 (six) hours as needed. 30 tablet 0   albuterol  (VENTOLIN  HFA) 108 (90 Base) MCG/ACT inhaler INHALE 1 PUFF INTO THE LUNGS EVERY 4 HOURS AS NEEDED. 18 each 2   bictegravir-emtricitabine-tenofovir AF (BIKTARVY ) 50-200-25 MG TABS tablet Take 1 tablet by mouth daily. 30 tablet 5   Blood Glucose Monitoring Suppl (BLOOD GLUCOSE MONITOR SYSTEM) w/Device KIT 1 Device by Does not apply route daily before breakfast. 1 kit 0   Blood Glucose Monitoring Suppl DEVI 1  each by Does not apply route in the morning and at bedtime. May substitute to any manufacturer covered by patient's insurance. 1 each 0   Blood Glucose Monitoring Suppl DEVI 1 each by Does not apply route daily. May substitute to any manufacturer covered by patient's insurance. 1 each 0   cetirizine  (ZYRTEC ) 10 MG tablet TAKE 1 TABLET(10 MG) BY MOUTH DAILY 30 tablet 3   clobetasol  cream (TEMOVATE ) 0.05 % Apply 1 Application topically 2 (two) times daily. 30 g 0   doxycycline  (VIBRAMYCIN ) 100 MG capsule Take 1 capsule (100 mg total) by mouth 2 (two) times daily. 20 capsule 0   Glucose Blood (BLOOD GLUCOSE TEST STRIPS) STRP 1 each by In Vitro route daily. May substitute to any manufacturer covered by patient's insurance. 100 each 3   hydrocortisone  cream 1 % Apply to affected area 2 times daily 15 g 0   Lancet Device MISC 1 each by Does not apply route daily. May substitute to any manufacturer covered by patient's insurance. 1 each 0   Lancets Misc. (ACCU-CHEK SOFTCLIX LANCET DEV) KIT 1 each by Does not apply route daily before breakfast. 1 kit 0   Lancets MISC 1 each by Does not apply route as directed. Dispense based on patient and insurance preference. Use up to four times daily as directed. (FOR ICD-10 E10.9, E11.9). 100 each 0   losartan -hydrochlorothiazide  (HYZAAR) 50-12.5 MG tablet Take 1 tablet by mouth daily. 90 tablet 3   metFORMIN  (GLUCOPHAGE -XR) 500 MG 24 hr tablet Take 2 tablets (1,000 mg total) by mouth daily with breakfast. 180 tablet 3   mupirocin  ointment (BACTROBAN ) 2 % Apply 1 Application topically 2 (two) times daily. 22 g 0   naproxen  (NAPROSYN ) 500 MG tablet TAKE 1 TABLET(500 MG) BY MOUTH TWICE DAILY WITH A MEAL 60 tablet 0   nystatin  cream (MYCOSTATIN ) Apply to affected area 2 times daily 30 g 0   omeprazole  (PRILOSEC) 40 MG capsule  TAKE 1 CAPSULE(40 MG) BY MOUTH DAILY 90 capsule 1   rizatriptan  (MAXALT ) 10 MG tablet TAKE 1/2-1 TABLET BY MOUTH AS NEEDED FOR MIGRAINE. MAY REPEAT  IN 2 HOURS IF NEEDED. MAX OF 2 TABLETS PER DAY 10 tablet 1   rosuvastatin  (CRESTOR ) 10 MG tablet Take 1 tablet (10 mg total) by mouth daily. 90 tablet 3   sitaGLIPtin  (JANUVIA ) 100 MG tablet Take 1 tablet (100 mg total) by mouth daily. 90 tablet 4   topiramate  (TOPAMAX ) 50 MG tablet TAKE 1 TABLET(50 MG) BY MOUTH DAILY 30 tablet 5   valACYclovir  (VALTREX ) 500 MG tablet TAKE 1 TABLET(500 MG) BY MOUTH TWICE DAILY 60 tablet 5   glipiZIDE  (GLUCOTROL  XL) 10 MG 24 hr tablet Take 1 tablet (10 mg total) by mouth daily. 90 tablet 3   pioglitazone  (ACTOS ) 30 MG tablet Take 1 tablet (30 mg total) by mouth daily. 90 tablet 3   No current facility-administered medications for this visit.    PHYSICAL EXAM: Vitals:   03/26/24 1354  BP: 132/80  Pulse: (!) 107  Resp: 16  SpO2: 99%  Weight: 169 lb (76.7 kg)  Height: 5' 7 (1.702 m)   Body mass index is 26.47 kg/m.  Wt Readings from Last 3 Encounters:  03/26/24 169 lb (76.7 kg)  02/11/24 166 lb 9.6 oz (75.6 kg)  01/09/24 160 lb (72.6 kg)    General: Well developed, well nourished female in no apparent distress.  HEENT: AT/[place], no external lesions.  Eyes: Conjunctiva clear and no icterus. Neck: Neck supple  Lungs: Respirations not labored Neurologic: Alert, oriented, normal speech Extremities / Skin: Dry.  Psychiatric: Does not appear depressed or anxious   Diabetic Foot Exam - Simple   No data filed     LABS Reviewed Lab Results  Component Value Date   HGBA1C 8.9 (A) 03/26/2024   HGBA1C 9.4 (A) 12/25/2023   HGBA1C 10.8 (A) 09/05/2023   No results found for: FRUCTOSAMINE Lab Results  Component Value Date   CHOL 179 03/14/2023   HDL 50 03/14/2023   LDLCALC 111 (H) 03/14/2023   TRIG 101 03/14/2023   CHOLHDL 3.6 03/14/2023   Lab Results  Component Value Date   MICRALBCREAT 10 03/25/2023   Lab Results  Component Value Date   CREATININE 0.92 10/02/2023   No results found for: GFR  ASSESSMENT / PLAN  1. Type 2 diabetes  mellitus with hyperglycemia, without long-term current use of insulin (HCC)   2. Uncontrolled type 2 diabetes mellitus with hyperglycemia (HCC)    Diabetes Mellitus type 2, complicated by no known complication. - Diabetic status / severity: Uncontrolled.  Slowly improving.  Lab Results  Component Value Date   HGBA1C 8.9 (A) 03/26/2024    - Hemoglobin A1c goal : <7%  Discussed in detail about diet plan, limiting portion, avoiding high carbohydrate meals including regular sodas.  She said not able to afford healthy diet.  Slowly improvement on diabetes control.  Encouraged her for compliance with medications and diet control as much as possible.  - Medications: See below.  No change today.  I) continue metformin  extended release 500 mg 2 tabs daily.   II) continue glipizide  extended release 10 mg daily. III) continue pioglitazone  30 mg daily. IV) continue Januvia  100 mg daily.   Blood sugar in the morning fasting and at bedtime.  Asked to bring glucometer in the follow-up visit.  Sent prescription for glucometer and test supplies today.  - Home glucose testing: In the morning fasting  and at bedtime.  - Discussed/ Gave Hypoglycemia treatment plan.  # Consult : Will refer to diabetic educator and dietitian.  # Annual urine for microalbuminuria/ creatinine ratio, no microalbuminuria currently, continue ACE/ARB /losartan .  Will check today. Last  Lab Results  Component Value Date   MICRALBCREAT 10 03/25/2023    # Foot check nightly.  # Annual dilated diabetic eye exams.  Advised to make visit with ophthalmology for diabetic eye exam.  - Diet: Make healthy diabetic food choices, discussed in detail. - Life style / activity / exercise: Discussed.  Advised to walk at least.  2. Blood pressure  -  BP Readings from Last 1 Encounters:  03/26/24 132/80    - Control is in target. - No change in current plans.  3. Lipid status / Hyperlipidemia - Last  Lab Results  Component  Value Date   LDLCALC 111 (H) 03/14/2023   - Continue pitavastatin  2 mg daily, managed by primary care provider.  Diagnoses and all orders for this visit:  Type 2 diabetes mellitus with hyperglycemia, without long-term current use of insulin (HCC) -     POCT glycosylated hemoglobin (Hb A1C) -     Blood Glucose Monitoring Suppl DEVI; 1 each by Does not apply route daily. May substitute to any manufacturer covered by patient's insurance. -     Glucose Blood (BLOOD GLUCOSE TEST STRIPS) STRP; 1 each by In Vitro route daily. May substitute to any manufacturer covered by patient's insurance. -     Lancet Device MISC; 1 each by Does not apply route daily. May substitute to any manufacturer covered by patient's insurance. -     Lancets MISC; 1 each by Does not apply route as directed. Dispense based on patient and insurance preference. Use up to four times daily as directed. (FOR ICD-10 E10.9, E11.9). -     Microalbumin / creatinine urine ratio -     glipiZIDE  (GLUCOTROL  XL) 10 MG 24 hr tablet; Take 1 tablet (10 mg total) by mouth daily. -     pioglitazone  (ACTOS ) 30 MG tablet; Take 1 tablet (30 mg total) by mouth daily.  Uncontrolled type 2 diabetes mellitus with hyperglycemia (HCC)    DISPOSITION Follow up in clinic in 3 months suggested.     All questions answered and patient verbalized understanding of the plan.  Jakarri Lesko, MD [facility] Endocrinology [facility] [address] [place], [address] [place], [place] [address] Phone # [phone]  At least part of this note was generated using voice recognition software. Inadvertent word errors may have occurred, which were not recognized during the proofreading process. "

## 2024-03-28 ENCOUNTER — Other Ambulatory Visit: Payer: Self-pay | Admitting: Family

## 2024-03-28 DIAGNOSIS — G43009 Migraine without aura, not intractable, without status migrainosus: Secondary | ICD-10-CM

## 2024-03-31 ENCOUNTER — Ambulatory Visit: Admitting: Family

## 2024-04-07 ENCOUNTER — Other Ambulatory Visit: Payer: Self-pay | Admitting: Family

## 2024-04-07 NOTE — Telephone Encounter (Signed)
"  Appt 12/9  "

## 2024-04-09 ENCOUNTER — Other Ambulatory Visit: Payer: Self-pay

## 2024-04-09 ENCOUNTER — Ambulatory Visit: Admitting: Family

## 2024-04-09 ENCOUNTER — Encounter: Payer: Self-pay | Admitting: Family

## 2024-04-09 VITALS — BP 118/79 | HR 98 | Ht 67.0 in | Wt 170.0 lb

## 2024-04-09 DIAGNOSIS — Z5902 Unsheltered homelessness: Secondary | ICD-10-CM

## 2024-04-09 DIAGNOSIS — Z79899 Other long term (current) drug therapy: Secondary | ICD-10-CM

## 2024-04-09 DIAGNOSIS — R400 Somnolence: Secondary | ICD-10-CM | POA: Diagnosis not present

## 2024-04-09 DIAGNOSIS — B20 Human immunodeficiency virus [HIV] disease: Secondary | ICD-10-CM

## 2024-04-09 DIAGNOSIS — L84 Corns and callosities: Secondary | ICD-10-CM

## 2024-04-09 DIAGNOSIS — R1030 Lower abdominal pain, unspecified: Secondary | ICD-10-CM | POA: Diagnosis not present

## 2024-04-09 DIAGNOSIS — F1520 Other stimulant dependence, uncomplicated: Secondary | ICD-10-CM

## 2024-04-09 DIAGNOSIS — Z0000 Encounter for general adult medical examination without abnormal findings: Secondary | ICD-10-CM

## 2024-04-09 DIAGNOSIS — Z599 Problem related to housing and economic circumstances, unspecified: Secondary | ICD-10-CM

## 2024-04-09 MED ORDER — BIKTARVY 50-200-25 MG PO TABS: 1.0000 | ORAL_TABLET | Freq: Every day | ORAL | 6 refills | Status: AC

## 2024-04-09 NOTE — Patient Instructions (Addendum)
 Schedule depo injection nurse visit between 2/17 and 3/3  Nice to see you.  We will check your lab work today  Continue to take your medication daily as prescribed.  Refills have been sent to the pharmacy.   Plan for follow up in 1 months or sooner if needed with lab work on the same day.  Have a great day and stay safe!

## 2024-04-09 NOTE — Assessment & Plan Note (Signed)
 Kristin Hart has increased sleepiness in the setting of apparent insomnia where she appears to not be sleeping well secondary to environment and nightmares. I am concerned she may be having an exacerbation of PTSD with the nightmares and may benefit from prazosin if this is the case. Cannot exclude sleep apnea and recommend testing. Does not appear to have mania or psychosis. 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 Have recommended she consider being evaluated at the [facility] for further assessment. No suicidal ideations or auditory or visual hallucinations at present.

## 2024-04-09 NOTE — Assessment & Plan Note (Signed)
 Previous callus on toe and now concern for ingrown toenail. Recommend contact Podiatry for further evaluation and treatment as appropriate.

## 2024-04-09 NOTE — Assessment & Plan Note (Signed)
 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 Discussed rehabilitation. Cannot exclude symptoms experienced are not related to methamphetamine use. Not ready to quit at this time and remains at high risk for complications.

## 2024-04-09 NOTE — Assessment & Plan Note (Signed)
 Continues to live in a tent at this time. Referral previously placed to [facility].

## 2024-04-09 NOTE — Progress Notes (Signed)
 "   Brief Narrative   Patient ID: Kristin Hart, female    DOB: Dec 26, 1980, 44 y.o.   MRN: [id number]  Ms. Ricard is a 44 y/o caucasian female diagnosed with HIV disease on 01/02/19 with risk factor of heterosexual contact and IV drug use. Initial viral load on 06/23/19 23,300 and CD4 count 577. Genotype with no significant drug resistance. Entered care at [facility] Stage 1. [id number] negative. No history of opportunistic infection. Sole medication regimen of Biktarvy      Subjective:   Chief Complaint  Patient presents with   Follow-up    B20    HPI:  Kristin Hart is a 44 y.o. female with HIV disease last seen on 02/11/24 with well controlled virus and good adherence and tolerance to Biktarvy . No new lab work completed. Here today for follow up.  Kristin Hart has been doing okay since her last office visit and continues to take Biktarvy  as prescribed with no adverse side effects or problems obtaining medication from the pharmacy and covered by Medicaid. Following with endocrinology for diabetes with improved A1c. Has several concerns including continued foot pain and now may have an ingrown toenail. Has not yet followed up with Podiatry. Having increased fatigue and easily falling asleep recently. Not sleeping well and has only slept a few hours in the last 48 hours. Has nightmares at times and bed where she sleeps is not comfortable.Previously diagnosed with PTSD and not currently on treatment. Feels like she can fall asleep at any given point. Has also had a waxing and waning pain located in her left pelvis that started about 3 days ago and today does not hurt.   Kristin Hart continues to live in a tent and faces transportation issues and limited access to food. 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 Sexually active with condoms and site specific STD testing offered. Healthcare maintenance reviewed.   Denies fevers, chills, night sweats, headaches, changes in vision, neck pain/stiffness, nausea,  diarrhea, vomiting, lesions or rashes.  Lab Results  Component Value Date   CD4TCELL 58 10/17/2023   CD4TABS 825 10/17/2023   Lab Results  Component Value Date   HIV1RNAQUANT NOT DETECTED 10/17/2023     Allergies[1]    Outpatient Medications Prior to Visit  Medication Sig Dispense Refill   acetaminophen  (TYLENOL ) 500 MG tablet Take 1 tablet (500 mg total) by mouth every 6 (six) hours as needed. 30 tablet 0   albuterol  (VENTOLIN  HFA) 108 (90 Base) MCG/ACT inhaler INHALE 1 PUFF INTO THE LUNGS EVERY 4 HOURS AS NEEDED. 18 each 2   cetirizine  (ZYRTEC ) 10 MG tablet TAKE 1 TABLET(10 MG) BY MOUTH DAILY 30 tablet 3   clobetasol  cream (TEMOVATE ) 0.05 % Apply 1 Application topically 2 (two) times daily. 30 g 0   glipiZIDE  (GLUCOTROL  XL) 10 MG 24 hr tablet Take 1 tablet (10 mg total) by mouth daily. 90 tablet 3   hydrocortisone  cream 1 % Apply to affected area 2 times daily 15 g 0   losartan -hydrochlorothiazide  (HYZAAR) 50-12.5 MG tablet Take 1 tablet by mouth daily. 90 tablet 3   metFORMIN  (GLUCOPHAGE -XR) 500 MG 24 hr tablet Take 2 tablets (1,000 mg total) by mouth daily with breakfast. 180 tablet 3   mupirocin  ointment (BACTROBAN ) 2 % Apply 1 Application topically 2 (two) times daily. 22 g 0   naproxen  (NAPROSYN ) 500 MG tablet TAKE 1 TABLET(500 MG) BY MOUTH TWICE DAILY WITH A MEAL 60 tablet 0   nystatin  cream (MYCOSTATIN ) Apply to affected area 2 times  daily 30 g 0   omeprazole  (PRILOSEC) 40 MG capsule TAKE 1 CAPSULE(40 MG) BY MOUTH DAILY 90 capsule 1   pioglitazone  (ACTOS ) 30 MG tablet Take 1 tablet (30 mg total) by mouth daily. 90 tablet 3   rizatriptan  (MAXALT ) 10 MG tablet TAKE 1/2-1 TABLET BY MOUTH AS NEEDED FOR MIGRAINE. MAY REPEAT IN 2 HOURS IF NEEDED. MAX OF 2 TABLETS PER DAY 10 tablet 1   sitaGLIPtin  (JANUVIA ) 100 MG tablet Take 1 tablet (100 mg total) by mouth daily. 90 tablet 4   topiramate  (TOPAMAX ) 50 MG tablet TAKE 1 TABLET(50 MG) BY MOUTH DAILY 30 tablet 5   valACYclovir   (VALTREX ) 500 MG tablet TAKE 1 TABLET(500 MG) BY MOUTH TWICE DAILY 60 tablet 5   bictegravir-emtricitabine-tenofovir AF (BIKTARVY ) 50-200-25 MG TABS tablet Take 1 tablet by mouth daily. 30 tablet 5   Accu-Chek Softclix Lancets lancets Use as instructed 100 each 12   Blood Glucose Monitoring Suppl (BLOOD GLUCOSE MONITOR SYSTEM) w/Device KIT 1 Device by Does not apply route daily before breakfast. 1 kit 0   Blood Glucose Monitoring Suppl DEVI 1 each by Does not apply route in the morning and at bedtime. May substitute to any manufacturer covered by patient's insurance. 1 each 0   Blood Glucose Monitoring Suppl DEVI 1 each by Does not apply route daily. May substitute to any manufacturer covered by patient's insurance. 1 each 0   doxycycline  (VIBRAMYCIN ) 100 MG capsule Take 1 capsule (100 mg total) by mouth 2 (two) times daily. (Patient not taking: Reported on 04/09/2024) 20 capsule 0   Glucose Blood (BLOOD GLUCOSE TEST STRIPS) STRP 1 each by In Vitro route daily. May substitute to any manufacturer covered by patient's insurance. 100 each 3   Lancet Device MISC 1 each by Does not apply route daily. May substitute to any manufacturer covered by patient's insurance. 1 each 0   Lancets Misc. (ACCU-CHEK SOFTCLIX LANCET DEV) KIT 1 each by Does not apply route daily before breakfast. 1 kit 0   Lancets MISC 1 each by Does not apply route as directed. Dispense based on patient and insurance preference. Use up to four times daily as directed. (FOR ICD-10 E10.9, E11.9). 100 each 0   rosuvastatin  (CRESTOR ) 10 MG tablet Take 1 tablet (10 mg total) by mouth daily. 90 tablet 3   No facility-administered medications prior to visit.     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, per pt   Depression    Diabetes mellitus without complication (HCC)    type 2   Foreign body  in small intestine    GERD (gastroesophageal reflux disease)    HIV (human immunodeficiency virus infection) (HCC)    Hypothyroidism    Stomach ulcer    Substance abuse (HCC)    Meth, IV drug use     Past Surgical History:  Procedure Laterality Date   CESAREAN SECTION N/A 07/04/2016   Procedure: CESAREAN SECTION;  Surgeon: Lang JINNY Peel, DO;  Location: [facility] BIRTHING SUITES;  Service: Obstetrics;  Laterality: N/A;   ENTEROSCOPY N/A 11/08/2017   Procedure: ENTEROSCOPY;  Surgeon: Abran Norleen SAILOR, MD;  Location: [facility] ENDOSCOPY;  Service: Endoscopy;  Laterality: N/A;   NO PAST SURGERIES          Review of Systems  Constitutional:  Negative for chills, diaphoresis, fatigue and fever.  Respiratory:  Negative for cough, chest tightness, shortness of breath and wheezing.   Cardiovascular:  Negative for chest pain.  Gastrointestinal:  Negative for abdominal pain, diarrhea, nausea and vomiting.  Genitourinary:  Positive for pelvic pain.  Musculoskeletal:        Positive for foot pain     Objective:   BP 118/79   Pulse 98   Ht 5' 7 (1.702 m)   Wt 170 lb (77.1 kg)   SpO2 100%   BMI 26.63 kg/m  Nursing note and vital signs reviewed.  Physical Exam Constitutional:      General: She is not in acute distress.    Appearance: She is well-developed.     Comments: Seated in the chair; nodding off at times during disussion  Cardiovascular:     Rate and Rhythm: Normal rate and regular rhythm.     Heart sounds: Normal heart sounds.  Pulmonary:     Effort: Pulmonary effort is normal.     Breath sounds: Normal breath sounds.  Skin:    General: Skin is warm and dry.  Neurological:     Mental Status: She is alert.          11/14/2023    3:25 PM 10/17/2023    3:57 PM 08/29/2023    2:01 PM 06/10/2023    4:46 PM 04/29/2023    3:50 PM  Depression screen PHQ 2/9  Decreased Interest 1 1 0 1 0  Down, Depressed, Hopeless 1 2 0 1 1  PHQ - 2 Score 2 3 0 2 1  Altered sleeping 0 2 0 2    Tired, decreased energy 1 2 0 1   Change in appetite 0 0 0 1   Feeling bad or failure about yourself  1 3 0 1   Trouble concentrating 0 1 0 2   Moving slowly or fidgety/restless 0 0 0 2   Suicidal thoughts 0 0 0 0   PHQ-9 Score 4  11  0  11    Difficult doing work/chores Somewhat difficult Somewhat difficult  Somewhat difficult      Data saved with a previous flowsheet row definition        11/14/2023    3:26 PM 10/17/2023    3:57 PM 08/29/2023    2:02 PM 11/29/2021    1:33 PM  GAD 7 : Generalized Anxiety Score  Nervous, Anxious, on Edge 1  2  0  1   Control/stop worrying 1  2  0  1   Worry too much - different things 1  1  0  1   Trouble relaxing 1  1  0  1   Restless 0  0  0  0   Easily annoyed or irritable 2  1  0  1   Afraid - awful might happen 0  0  0  0   Total GAD 7 Score 6 7 0 5  Anxiety Difficulty Somewhat difficult Somewhat difficult       Data saved with a previous flowsheet row definition     The 10-year ASCVD risk score (Arnett DK, et al., 2019) is: 5.3%   Values used to calculate the score:     Age: 86 years     Clinically relevant sex: Female     Is Non-Hispanic African American: No     Diabetic: Yes     Tobacco smoker: Yes     Systolic Blood Pressure: 118 mmHg     Is  BP treated: Yes     HDL Cholesterol: 50 mg/dL     Total Cholesterol: 179 mg/dL      Assessment & Plan:    Patient Active Problem List   Diagnosis Date Noted   Sleepiness 04/09/2024   Callus of toe 11/15/2023   Diabetic polyneuropathy associated with type 2 diabetes mellitus (HCC) 10/17/2023   Nausea 08/29/2023   Poison ivy 07/01/2023   Infection of left hand 04/24/2023   Dry skin 03/12/2023   Diarrhea 12/12/2022   Lower abdominal pain 05/28/2022   Post-nasal drip 05/28/2022   Plantar fasciitis, right 03/19/2022   Numbness and tingling in both hands 03/19/2022   Poor social situation 10/25/2021   Axillary abscess 09/20/2021   Therapeutic drug monitoring 08/24/2021   Birth  control counseling 12/20/2020   Hypertension 09/21/2020   Grief 08/29/2020   Encounter for surveillance of injectable contraceptive 07/29/2020   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   Screening for cervical cancer 04/21/2020   Screening for STDs (sexually transmitted diseases) 04/21/2020   Costochondritis 02/19/2020   Bilateral hip pain 02/19/2020   Elbow pain, right 12/04/2019   Housing problems 11/23/2019   Mental health disorder 10/12/2019   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   Paresthesias 10/12/2019   Healthcare maintenance 08/05/2019   HIV disease (HCC) 12/31/2018   Anxiety 05/29/2017   Self-mutilation 05/29/2017   Depression 06/07/2016   Marijuana use 06/07/2016   Low vitamin D  level 03/15/2016   Type 2 diabetes mellitus with hyperglycemia (HCC) 02/22/2016   Asthma 02/22/2016   ADD (attention deficit disorder) 02/22/2016   Methamphetamine use disorder, severe, 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/22/2016   Hyperlipidemia with target LDL less than 70 01/31/2012   Migraine 01/31/2012   Tremor, 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     Problem List Items Addressed This Visit       Musculoskeletal and Integument   Callus of toe   Previous callus on toe and now concern for ingrown toenail. Recommend contact Podiatry for further evaluation and treatment as appropriate.         Other   Methamphetamine use disorder, severe, dependence (HCC)   Verita continues to use methamphetamine. Discussed rehabilitation. Cannot exclude symptoms experienced are not related to methamphetamine use. Not ready to quit at this time and remains at high risk for complications.       HIV disease (HCC) - Primary   Ms. Brogden continues to have well controlled virus with good adherence and tolerance to her Biktarvy . Reviewed previous lab work and discussed plan of care and U equals U. No problems obtaining medication from the pharmacy and covered by Medicaid. Social determinants reviewed and continues to  have issues with housing and access to food. Uses the bus for transportation. Food from pantry provided. Will check lab work at next office visit secondary to time of appointment.  Continue current dose of Biktarvy . Plan for follow up in 1 month or sooner if needed.       Relevant Medications   bictegravir-emtricitabine-tenofovir AF (BIKTARVY ) 50-200-25 MG TABS tablet   Healthcare maintenance   Discussed importance of safe sexual practice and condom use. Condoms and site specific STD testing offered.  Vaccinations reviewed and declined following counseling.  Due for breast cancer screening and will consider mammogram.  Working with dentistry for routine dental care.  Cervical cancer screening up to date.  Due for birth control at next office visit.       Housing problems   Continues to live in a  tent at this time. Referral previously placed to [facility].       Lower abdominal pain   New acute left lower pelvic pain concerning for possible gynecological origin and recommend follow up with gynecology if symptoms worsen or do not improve.       Sleepiness   Taron has increased sleepiness in the setting of apparent insomnia where she appears to not be sleeping well secondary to environment and nightmares. I am concerned she may be having an exacerbation of PTSD with the nightmares and may benefit from prazosin if this is the case. Cannot exclude sleep apnea and recommend testing. Does not appear to have mania or psychosis. 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 Have recommended she consider being evaluated at the [facility] for further assessment. No suicidal ideations or auditory or visual hallucinations at present.         I am having Rosina SAUNDERS. Loveall maintain her hydrocortisone  cream, acetaminophen , nystatin  cream,  valACYclovir , Accu-Chek Softclix Lancet Dev, Blood Glucose Monitor System, Blood Glucose Monitoring Suppl, albuterol , Accu-Chek Softclix Lancets, losartan -hydrochlorothiazide , rosuvastatin , clobetasol  cream, rizatriptan , metFORMIN , cetirizine , doxycycline , mupirocin  ointment, sitaGLIPtin , omeprazole , naproxen , Blood Glucose Monitoring Suppl, BLOOD GLUCOSE TEST STRIPS, Lancet Device, Lancets, glipiZIDE , pioglitazone , topiramate , and Biktarvy .   Meds ordered this encounter  Medications   bictegravir-emtricitabine-tenofovir AF (BIKTARVY ) 50-200-25 MG TABS tablet    Sig: Take 1 tablet by mouth daily.    Dispense:  30 tablet    Refill:  6    Supervising Provider:   LUIZ CHANNEL [phone]    Prescription Type::   Renewal     Follow-up: Return in about 1 month (around 05/09/2024). or sooner if needed.    Cathlyn July, MSN, FNP-C Nurse Practitioner [facility] Main number: [phone]      [1] No Known Allergies  "

## 2024-04-09 NOTE — Assessment & Plan Note (Signed)
 Discussed importance of safe sexual practice and condom use. Condoms and site specific STD testing offered.  Vaccinations reviewed and declined following counseling.  Due for breast cancer screening and will consider mammogram.  Working with dentistry for routine dental care.  Cervical cancer screening up to date.  Due for birth control at next office visit.

## 2024-04-09 NOTE — Assessment & Plan Note (Signed)
 New acute left lower pelvic pain concerning for possible gynecological origin and recommend follow up with gynecology if symptoms worsen or do not improve.

## 2024-04-09 NOTE — Assessment & Plan Note (Signed)
 Kristin Hart continues to have well controlled virus with good adherence and tolerance to her Biktarvy . Reviewed previous lab work and discussed plan of care and U equals U. No problems obtaining medication from the pharmacy and covered by Medicaid. Social determinants reviewed and continues to have issues with housing and access to food. Uses the bus for transportation. Food from pantry provided. Will check lab work at next office visit secondary to time of appointment.  Continue current dose of Biktarvy . Plan for follow up in 1 month or sooner if needed.

## 2024-04-10 NOTE — Telephone Encounter (Signed)
Okay to refill Valtrex 

## 2024-05-05 ENCOUNTER — Ambulatory Visit: Payer: Self-pay | Admitting: Family

## 2024-06-23 ENCOUNTER — Ambulatory Visit: Admitting: Endocrinology
